# Patient Record
Sex: Male | Born: 1940 | Race: Black or African American | Hispanic: No | Marital: Single
Health system: Southern US, Community
[De-identification: ages and names within clinical notes are randomized; demographics above are authoritative.]

## PROBLEM LIST (undated history)

## (undated) DIAGNOSIS — I119 Hypertensive heart disease without heart failure: Secondary | ICD-10-CM

## (undated) DIAGNOSIS — E7800 Pure hypercholesterolemia, unspecified: Secondary | ICD-10-CM

## (undated) DIAGNOSIS — N529 Male erectile dysfunction, unspecified: Secondary | ICD-10-CM

## (undated) DIAGNOSIS — Z8679 Personal history of other diseases of the circulatory system: Secondary | ICD-10-CM

## (undated) DIAGNOSIS — E118 Type 2 diabetes mellitus with unspecified complications: Secondary | ICD-10-CM

## (undated) DIAGNOSIS — R519 Headache, unspecified: Secondary | ICD-10-CM

## (undated) DIAGNOSIS — Z8509 Personal history of malignant neoplasm of other digestive organs: Secondary | ICD-10-CM

## (undated) DIAGNOSIS — S065XAA Traumatic subdural hemorrhage with loss of consciousness status unknown, initial encounter: Secondary | ICD-10-CM

## (undated) DIAGNOSIS — M1990 Unspecified osteoarthritis, unspecified site: Secondary | ICD-10-CM

## (undated) DIAGNOSIS — I2510 Atherosclerotic heart disease of native coronary artery without angina pectoris: Secondary | ICD-10-CM

## (undated) DIAGNOSIS — R51 Headache: Secondary | ICD-10-CM

## (undated) DIAGNOSIS — F32A Depression, unspecified: Secondary | ICD-10-CM

## (undated) DIAGNOSIS — G4730 Sleep apnea, unspecified: Secondary | ICD-10-CM

## (undated) DIAGNOSIS — R413 Other amnesia: Secondary | ICD-10-CM

## (undated) DIAGNOSIS — S065X9A Traumatic subdural hemorrhage with loss of consciousness of unspecified duration, initial encounter: Secondary | ICD-10-CM

## (undated) DIAGNOSIS — F329 Major depressive disorder, single episode, unspecified: Secondary | ICD-10-CM

## (undated) DIAGNOSIS — K219 Gastro-esophageal reflux disease without esophagitis: Secondary | ICD-10-CM

## (undated) DIAGNOSIS — I639 Cerebral infarction, unspecified: Secondary | ICD-10-CM

## (undated) HISTORY — PX: EYE SURGERY: SHX253

## (undated) HISTORY — DX: Personal history of malignant neoplasm of other digestive organs: Z85.09

## (undated) HISTORY — PX: TONSILLECTOMY: SUR1361

## (undated) HISTORY — DX: Pure hypercholesterolemia, unspecified: E78.00

## (undated) HISTORY — DX: Personal history of other diseases of the circulatory system: Z86.79

## (undated) HISTORY — DX: Other amnesia: R41.3

## (undated) HISTORY — DX: Type 2 diabetes mellitus with unspecified complications: E11.8

## (undated) HISTORY — PX: CARPAL TUNNEL RELEASE: SHX101

---

## 1995-02-17 HISTORY — PX: CARDIAC CATHETERIZATION: SHX172

## 2000-04-08 ENCOUNTER — Ambulatory Visit (HOSPITAL_COMMUNITY): Admission: RE | Admit: 2000-04-08 | Discharge: 2000-04-08 | Payer: Self-pay | Admitting: Cardiology

## 2000-04-08 HISTORY — PX: CARDIAC CATHETERIZATION: SHX172

## 2000-04-16 ENCOUNTER — Encounter: Payer: Self-pay | Admitting: Thoracic Surgery (Cardiothoracic Vascular Surgery)

## 2000-04-20 ENCOUNTER — Inpatient Hospital Stay (HOSPITAL_COMMUNITY)
Admission: RE | Admit: 2000-04-20 | Discharge: 2000-04-27 | Payer: Self-pay | Admitting: Thoracic Surgery (Cardiothoracic Vascular Surgery)

## 2000-04-20 ENCOUNTER — Encounter: Payer: Self-pay | Admitting: Thoracic Surgery (Cardiothoracic Vascular Surgery)

## 2000-04-21 ENCOUNTER — Encounter: Payer: Self-pay | Admitting: Thoracic Surgery (Cardiothoracic Vascular Surgery)

## 2000-04-21 HISTORY — PX: CORONARY ARTERY BYPASS GRAFT: SHX141

## 2000-04-22 ENCOUNTER — Encounter: Payer: Self-pay | Admitting: Thoracic Surgery (Cardiothoracic Vascular Surgery)

## 2002-05-02 ENCOUNTER — Ambulatory Visit (HOSPITAL_COMMUNITY): Admission: RE | Admit: 2002-05-02 | Discharge: 2002-05-03 | Payer: Self-pay | Admitting: Cardiology

## 2002-05-02 HISTORY — PX: CARDIAC CATHETERIZATION: SHX172

## 2002-05-02 HISTORY — PX: CORONARY ANGIOPLASTY: SHX604

## 2004-01-29 ENCOUNTER — Encounter (INDEPENDENT_AMBULATORY_CARE_PROVIDER_SITE_OTHER): Payer: Self-pay | Admitting: *Deleted

## 2004-01-29 ENCOUNTER — Ambulatory Visit (HOSPITAL_COMMUNITY): Admission: RE | Admit: 2004-01-29 | Discharge: 2004-01-29 | Payer: Self-pay | Admitting: *Deleted

## 2004-10-30 ENCOUNTER — Encounter: Admission: RE | Admit: 2004-10-30 | Discharge: 2004-10-30 | Payer: Self-pay | Admitting: Neurology

## 2006-03-10 ENCOUNTER — Inpatient Hospital Stay (HOSPITAL_COMMUNITY): Admission: EM | Admit: 2006-03-10 | Discharge: 2006-03-12 | Payer: Self-pay | Admitting: Emergency Medicine

## 2007-11-07 ENCOUNTER — Emergency Department (HOSPITAL_COMMUNITY): Admission: EM | Admit: 2007-11-07 | Discharge: 2007-11-08 | Payer: Self-pay | Admitting: Emergency Medicine

## 2008-07-25 ENCOUNTER — Emergency Department (HOSPITAL_COMMUNITY): Admission: EM | Admit: 2008-07-25 | Discharge: 2008-07-26 | Payer: Self-pay | Admitting: Emergency Medicine

## 2009-06-07 ENCOUNTER — Encounter: Admission: RE | Admit: 2009-06-07 | Discharge: 2009-06-07 | Payer: Self-pay | Admitting: Internal Medicine

## 2010-07-04 NOTE — H&P (Signed)
NAME:  [name], CALLEROS NO.:  [id number]   MEDICAL RECORD NO.:  [id number]          PATIENT TYPE:  INP   LOCATION:  5031                         FACILITY:  [facility]   PHYSICIAN:  Ardeth Sportsman, MD     DATE OF BIRTH:  10/16/1940   DATE OF ADMISSION:  03/10/2006  DATE OF DISCHARGE:                              HISTORY & PHYSICAL   CARDIOLOGIST:  Dr. Aleen Campi with [facility].   ENDOCRINOLOGIST:  Dr. Juleen China.   REQUESTING PHYSICIAN:  Elliot L. Effie Shy, [name] with [facility].   TYPE OF CONSULTATION:  General surgical consultation.   REASON FOR CONSULTATION:  Abdominal pain, probable partial small bowel  obstruction.   HISTORY OF PRESENT ILLNESS:  [name] is a 70 year old gentleman  with coronary disease, status post bypass in 2002, otherwise stable,  diabetes on Lantus and 70/30 insulin, hypertension,  hypercholesterolemia, question of obstructive sleep apnea.  These issues  have been relatively stable.  He has not had any abdominal surgeries in  the past.  He has had some reflux disease and he has also had a  colonoscopy that showed some diverticulosis back in 2005.   He noted about a less than 24-hour history of severe abdominal pain,  diffuse, but primary left-sided.  The pain worsened to the point that he  actually started forcing himself to vomiting last night with some  bilious emesis.  No hematemesis.  No hematochezia or melena.  He could  not sleep at all last night and he came to the emergency room earlier  today and he has been in the ER for about 8 hours.  Initially his pain  was better controlled with some medication.  He got a CT scan of the  abdomen and actually vomited up some of the contrast, although now he  has had a loose bowel movement.  He denies any sick contacts or travel  history.  He has never had anything like this before.  He denies really  any change in his food and no one else has been sick around him.   PAST MEDICAL  HISTORY:  1. As noted above with coronary artery disease.  2. Diabetes, insulin requiring.  3. Hypercholesterolemia.  4. Hypertension.  5. Obstructive sleep apnea.  6. Gastroesophageal reflux disease.  7. Sigmoid diverticulosis.  8. History of gastritis in the past.  9. History of bilateral cataracts.   PAST SURGICAL HISTORY:  He had bilateral carpal tunnel releases.  He  also had coronary artery bypass grafting x5 here at [facility]  on April 20, 2000.   SOCIAL HISTORY:  He denies any tobacco, alcohol, or other drug use.   FAMILY HISTORY:  Some history of hypertension and diabetes in his  family, but no history of any major GI problems or other abnormalities.   MEDICATIONS:  He takes metformin, aspirin, Crestor, Seroquel,  metoprolol, Lantus, Novolin 70/30 insulin, Nexium,  amlodipine/benazepril, glimepiride, Plavix.   ALLERGIES:  NONE.   REVIEW OF SYSTEMS:  As noted per HPI.  In general, no recent fevers,  chills, or sweats.  No recent weight  gain or weight loss.  PSYCH:  No  history of depression or anxiety.  EYES:  History of cataracts in the  past, but otherwise his vision has been stable.  Otherwise negative.  ENT is otherwise negative.  No recent cold, cough, or flu.  PULMONARY:  No recent cold, cough, flu, or pleuritic chest pain.  CHEST:  He has not  had any exertional dyspnea on exertion or angina.  He had a cardiac  catheterization in 2004, which shows some stenting done of the mid right  coronary artery and first diagonal branch as well.  GI:  As above,  otherwise completely negative.  GU:  Negative.  LYMPH:  Negative.  ALLERGIC:  Negative.  SKIN:  Negative.  BREAST:  Negative.  HEPATIC/RENAL:  Negative.  ENDOCRINE:  He has been insulin-requiring,  but no major changes that he can recall.   PHYSICAL EXAMINATION:  VITAL SIGNS:  His came in with a temperature of  97.5, is the highest temperature he has had.  Blood pressure initially  is 167/85 currently  116/155, pulse of 75, respirations 18.  Initially  9/10 pain, currently 2/10 pain.  He is 96% saturation on room air.  GENERAL:  He is a well-developed, well-nourished, morbidly obese male.  He was lying on his abdomen rather still and tired, but not toxic.  PSYCH:  No evidence of dementia.  No evidence of psychosis or paranoia.  NEUROLOGICAL:  Cranial nerves II-XII are intact.  Hand grip is 5/5 equal  and symmetrical.  No resting or intention tremors.  EYES:  Pupils equal, round, and reactive to light.  Extraocular  movements are intact.  Sclerae nonicteric.  There is no evidence of any  jaundice.  Throat not injected.  ENT:  Normocephalic with no facial asymmetry.  Mucous membranes are dry.  Nasopharynx and oropharynx are clear.  NECK:  Supple without masses.  Trachea is midline.  HEART:  Regular rate and rhythm.  No murmurs, clicks, or rubs.  He has  well-healed sternotomy incision.  LUNGS:  Clear to auscultation bilaterally.  There are no wheezes, rales,  or rhonchi.  ABDOMEN:  Mildly distended, but soft.  He has some mild tenderness on  his left greater than right side, but no definite peritoneal signs.  GENITAL:  Normal external male genitalia.  Testes descended bilaterally  and no inguinal hernias.  RECTAL:  Deferred per patient request.  EXTREMITIES:  No clubbing, cyanosis, or edema.  MUSCULOSKELETAL:  Full range of motion shoulders, elbows, wrists, as  well hips, knees and ankles.  LYMPH:  No head, neck,axillary, or groin lymphadenopathy.  SKIN:  No petechiae.  No purpura.  No sores or lesions.   STUDIES:  He does have a white count of 21,000 with somewhat of a left  shift.  A hemoglobin of 17.2.  His creatinine is 1.2, potassium at 3.9,  lipase at 20.  Urinalysis is essentially negative, although he does have  elevated glucose.  His serum glucose is 369, which is elevated.  CT scan of the abdomen shows some mild to moderately dilated small bowel  loops with perhaps a  transition sign down in his nearest terminal ileum.  Colon does have gas and stool within it, but is not completely  decompressed.  It is concerning for a partial small bowel obstruction.  There is no evidence of any internal hernia.  There is no umbilical  hernia.  There is no inguinal hernia.  There is no free fluid.  He has  maybe some steatohepatosis of his liver, but otherwise his solid organs  are unremarkable and his gallbladder appears unremarkable as well.   ASSESSMENT AND PLAN:  A 70 year old gentleman with abdominal pain,  nausea, vomiting, and some diarrhea consistent with partial small bowel  obstruction, versus possible gastroenteritis.  1. Admit.  2. Hydrate.  3. Serial exams.  Will hold off narcotics.  4. If he vomits again, he gets an NG tube.  He just had a loose bowel      movement in the ER and says he is less distended now.  Will just      observe at this point.  5. He may require surgery if he does not improve.  6. Aggressive diabetic control with sliding scale insulin.  7. Hypertension control.  8. Hold on his anticoagulation of aspirin and Plavix at this time      until determined if he needs surgery or not.  9. Reflux disease is stable.  I am doubtful that that is the source of      his etiology of pain, but will keep him on his PPI.  10.Increase activity for deep venous thrombosis prophylaxis.  Possible      SCDs if not walking.      Ardeth Sportsman, MD  Electronically Signed     SCG/MEDQ  D:  03/10/2006  T:  03/11/2006  Job:  [id number]

## 2010-07-04 NOTE — Op Note (Signed)
NAME:  [name], [name] NO.:  [id number]   MEDICAL RECORD NO.:  [id number]          PATIENT TYPE:  AMB   LOCATION:  ENDO                         FACILITY:  [facility]   PHYSICIAN:  Georgiana Spinner, [name]    DATE OF BIRTH:  December 16, 1940   DATE OF PROCEDURE:  DATE OF DISCHARGE:                                 OPERATIVE REPORT   PROCEDURE:  Upper endoscopy with biopsy.   INDICATIONS:  Gastroesophageal reflux disease.   ANESTHESIA:  Demerol 40, Versed 4 mg.   DESCRIPTION OF PROCEDURE:  With the patient in mildly sedated in the left  lateral decubitus position, the Olympus video scopic endoscope was inserted  in the mouth, passed under direct vision through the esophagus which  appeared normal.  There was one area that could have been suspicious for  Barrett's.  I elected to biopsy it.  This was at the squamocolumnar  junction.  We then entered into the stomach.  The fundus appeared  erythematous and snake-skinned.  This was photographed and biopsied.  The  body, antrum, duodenal bulb, second portion of the duodenum all appeared  normal.  From this point, the endoscope was slowly withdrawn, taking  circumferential views of the duodenal mucosa until the endoscope had been  pulled back into the stomach, placed in retroflexion to view the stomach  from below.  The endoscope was straightened and withdrawn, taking  circumferential views of the remaining gastric and esophageal mucosa.  The  patient's vital signs and pulse oximeter remained stable.  The patient  tolerated the procedure well with no apparent complications.   FINDINGS:  Changes of the fundus of snake skinning and erythema, biopsied.  Question of Barrett's, biopsied.  Await biopsy report.  The patient will  call me for results.  Follow up as an outpatient.  Proceed to colonoscopy.       GMO/MEDQ  D:  01/29/2004  T:  01/29/2004  Job:  [id number]

## 2010-07-04 NOTE — Op Note (Signed)
[place]  Patient:    [name], [name]                      MRN: [id number] Proc. Date: 04/20/00 Adm. Date:  [id number] Attending:  Tressie Stalker CC:         Aram Candela. Aleen Campi, [name]  Bernadene Person, [name]   Operative Report  PREOPERATIVE DIAGNOSIS:  Severe three-vessel coronary artery disease with class III progressive angina.  POSTOPERATIVE DIAGNOSIS:  Severe three-vessel coronary artery disease with class III progressive angina.  PROCEDURE:  Median sternotomy for coronary artery bypass grafting x 5 (left internal mammary artery to distal left anterior descending coronary artery, saphenous vein graft to first diagonal branch and sequential saphenous vein graft to second circumflex marginal branch, saphenous vein graft to second diagonal branch, saphenous vein graft to posterior descending coronary artery).  SURGEON:  Salvatore Decent. Cornelius Moras, [name]  ASSISTANT:  Dominica Severin, P.A.  ANESTHESIA:  General.  BRIEF CLINICAL NOTE:  The patient is a a 70 year old obese African-American male followed by Dr. Adela Lank and referred by Dr. Charolette Child for management of coronary artery disease.  The patient is obese, has has of obstructive sleep apnea, hypertension, type 2 diabetes mellitus, and hypercholesterolemia.  He presents with a two to three month history of progressive angina.  Cardiac catheterization performed by Dr. Aleen Campi demonstrates severe three-vessel coronary artery disease with preserved left ventricular function.  OPERATIVE CONSENT:  The patient and his wife are counseled at length regarding the indications and potential benefits of coronary artery bypass grafting. They understand the associated risks of surgery, including but not limited to risk of death, stroke, myocardial infarction, bleeding requiring blood transfusion, arrhythmia, infection, and recurrent coronary artery disease. They accept these risks as well as any  unforeseen complications and desire to proceed with surgery as described.  DESCRIPTION OF PROCEDURE:  The patient is brought to the operating room on the above-mentioned date, and invasive hemodynamic monitoring is established by the anesthesia service under the care and direction of Judie Petit, [name] The patient is placed in the supine position on the operating table. Intravenous antibiotics are administered.  Following induction with general endotracheal anesthesia, the patients chest, abdomen, both groins, and both lower extremities are prepared and draped in a sterile manner.  A median sternotomy incision is performed, and the left internal mammary artery is dissected from the chest wall and prepared for bypass grafting.  The left internal mammary artery is felt to be good-quality conduit for bypass grafting.  Simultaneously, saphenous vein is obtained from the patients right thigh through a series of longitudinal incisions.  As the saphenous vein continues distally at the level of the knee, it becomes somewhat small and frail.  Therefore, an additional segment of saphenous vein is obtained from the patients left thigh rather than continuing below the patients knee on the right side.  The patient is heparinized systemically.  The pericardium is opened.  The ascending aorta is inspected and is notably free of any palpable plaques or calcifications.  The ascending aorta and the right atrium are cannulated for cardiopulmonary bypass.  Adequate heparinization is verified.  Cardiopulmonary bypass is begun, and the surface of the heart is inspected. There is mild to moderate left ventricular hypertrophy.  Distal sites are selected for coronary bypass grafting.  Portions of the saphenous vein and the left internal mammary artery are trimmed to appropriate length.  A temperature probe is placed in the left  ventricular septum, and a Styrofoam pad is placed to protect the left phrenic  nerve from thermal injury.  A cardioplegia catheter is placed in the ascending aorta.  The patient is cooled to 30 degrees systemic temperature.  The aortic crossclamp is applied, and cardioplegia is delivered in antegrade fashion to through the aortic root.  Ice saline slush is applied for topical hypothermia. The initial cardioplegic arrest and myocardial cooling are excellent.  Repeat doses of cardioplegia are administered intermittently throughout the crossclamp portion of the operation both through the aortic and down subsequently-placed vein grafts to maintain septal temperature below 15 degrees Centigrade.  The following distal coronary anastomoses are performed: (1) Posterior descending coronary artery is grafted in an end-to-side fashion using running 7-0 Prolene suture.  This coronary measures 1.5 mm in diameter and is of good quality.  (2) The first diagonal branch off the left anterior descending coronary artery is grafted with a saphenous vein graft in a side-to-side fashion using a side branch of the vein conduit.  This coronary measures 1.6 mm in diameter and is of fair quality.  (3) The second circumflex marginal branch is grafted using the sequential saphenous vein graft off the vein placed to the first diagonal branch.  This coronary measures 1.5 mm in diameter and is of fair to good quality.  (4) The second diagonal branch off the left anterior descending coronary artery was grafted with a saphenous vein graft in an end-to-side fashion.  This coronary measures 1.5 mm in diameter and is of fair to good quality.  (5) The distal left anterior descending coronary artery was grafted with the left internal mammary artery using running 8-0 Prolene suture.  This coronary measures 2.0 mm in diameter and is of good quality.  It is intramyocardial.  The septal temperature is noted to rise rapidly and dramatically upon reperfusion of the left internal mammary artery.  The aortic  crossclamp is removed after a total crossclamp time of 68  minutes.  The heart begins to beat spontaneously.  All three proximal saphenous vein anastomoses are performed directly to the ascending aorta under a separate partial occlusion clamp.  All proximal and distal anastomoses are inspected for hemostasis and appropriate graft orientation.  Epicardial pacing wires are affixed to the right ventricular outflow tract and to the right atrial appendage. The patient is rewarmed to greater than 37 degrees.  The patient is weaned from cardiopulmonary bypass without difficulty.  The patients rhythm at separation from bypass is a junctional bradycardia requiring AV sequential dual-chamber pacing.  Total cardiopulmonary bypass time for the operation is 121 minutes.  The venous and arterial cannulae are removed uneventfully.  Protamine is administered to reverse the anticoagulation.  The mediastinum and the left chest are irrigated with saline solution containing vancomycin.  Meticulous surgical hemostasis is ascertained.  The mediastinum and the left chest are drained with three chest tubes placed through separate stab incisions inferiorly.  The median sternotomy is closed in a routine fashion.  Both thigh incisions are closed in multiple layers after placement of 19 French Blake drains to drain the deep subcutaneous tissues.  Skin staples are used to close the skin incisions of both thighs, while a subcuticular skin closure is used for the sternal incision.  The patient tolerated the procedure well.  The patient is transported to the surgical intensive care unit in stable condition.  There are no intraoperative complications.  All sponge, instrument, and needle counts are verified correct at completion of the operation.  No autologous blood products were administered. DD:  04/20/00 TD:  04/21/00 Job: [id number]

## 2010-07-04 NOTE — Discharge Summary (Signed)
[name], [name]                      MRN: [id number] Adm. Date:  [id number] Disc. Date: [id number] Attending:  Tressie Stalker Dictator:   Carlye Grippe. CC:         Salvatore Decent. Cornelius Moras, [name]  Leomar R. Aleen Campi, [name]  Bernadene Person, [name]   Discharge Summary  HISTORY OF PRESENT ILLNESS:  This is a 70 year old, obese, African-American male with a history of coronary artery disease, hypertension, type 2 diabetes mellitus, hypercholesterolemia, and obstructive sleep apnea.  He underwent angioplasty in 1997 for the first and second diagonal branches of the left anterior descending artery, as well as a second circumflex marginal branch. He has done well until recently when over the past approximate two to three months he has developed symptoms of progressive exertional angina.  He describes episodes of substernal chest tightness which are brought on by stress or physician activity and typically relieved with rest.  He underwent a recent stress test which was positive, prompting elective cardiac catheterization by Aram Candela. Aleen Campi, [name], on April 08, 2000.  This demonstrated severe three-vessel coronary artery disease with preserved left ventricular function. He was referred to [facility]. Cornelius Moras, [name], for evaluation for coronary surgical revascularization.  Dr. Cornelius Moras evaluated the patient and his studies and agreed with proceeding with the procedure.  PAST MEDICAL HISTORY:  Coronary artery disease as stated previously.  He also has a history of hypertension, type 2 diabetes mellitus, currently insulin dependent, hypercholesterolemia, obstructive sleep apnea, remote history of gastritis, and bilateral cataracts.  PAST SURGICAL HISTORY:  Bilateral carpal tunnel syndrome, otherwise unremarkable.  FAMILY HISTORY:  Please see the history and physical done at the time of admission.  SOCIAL HISTORY:  Please see the history  and physical done at the time of admission.  REVIEW OF SYSTEMS:  Please see the history and physical done at the time of admission.  PHYSICAL EXAMINATION:  Please see the history and physical done at the time of admission.  MEDICATIONS ON ADMISSION:  1. Insulin 70/30 60 units twice daily.  2. Lantus insulin 20 units once daily.  3. Toprol XL 50 mg once daily.  4. Glucophage 1000 mg twice daily.  5. Guaifenesin 2 mg once daily.  6. Chlorthalidone 25 mg daily.  7. Amaryl 4 mg twice daily.  8. Seroquel 600 mg once daily at bedtime.  The patient states that he takes     200 mg of Seroquel daily.  9. Nexium 40 mg once daily. 10. Gabitril 4 mg daily. 11. Enteric-coated aspirin 325 mg daily. 12. Plavix 75 mg daily.  ALLERGIES:  No known drug allergies.  HOSPITAL COURSE:  The patient was admitted electively on April 20, 2000, and underwent the following procedure:  Coronary artery bypass grafting x 5.  The following grafts were placed:  1. Left internal mammary artery to the LAD. 2. A saphenous vein graft sequentially to the diagonal #1 and the circumflex marginal #2.  3. A saphenous vein graft to the diagonal #2.  4. A saphenous vein graft to the posterior descending coronary artery.  The cross clamp time was 68 minutes and pump time 121 minutes.  The patient required no blood products and came off cardiopulmonary bypass with no inotropic agents.  He did come off cardiopulmonary bypass with AV sequential pacing.  He was taken to the surgical intensive care unit  in stable condition.  During the postoperative hospital course, the patient has done well. Postoperatively, he has maintained stable hemodynamics.  All routine lines, monitors, drips, and tubes were discontinued in a step-wise routine fashion. The patient did have a postoperative anemia and has been started on iron for a hematocrit of 24%.  The patient did have some mild difficulties with bronchial constriction and wheezing  and was placed on a Combivent metered dose inhaler for a short period of time and this appears to have resolved.  The patient has had some difficulty stabilizing his capillary blood glucose levels and adjustments have been made throughout his hospitalization for his diabetic management.  Currently the patient is quite stable.  He is afebrile.  He is in a normal sinus rhythm.  Capillary blood glucoses and laboratory values are felt to be under stable and adequate control on the current regimen.  His incisions are healing well without signs of infection.  He tolerate diet and activities commensurate for level of postoperative convalescence.  Overall he is tentatively felt to be quite stable for tentative discharge in the morning of April 27, 2000, following morning round re-evaluation.  DISCHARGE MEDICATIONS:  Medications on discharge include coated aspirin one q.d., Tylox one to two q.4-6h. p.r.n., Lopressor 25 mg every 12 hours.  He is to resume his Nexium 40 mg daily, Gabitril 4 mg daily, Glucophage 1 g twice daily, and Lantus Insulin 20 units daily.  In consultation with W. Adela Lank, [name], the patient is recommended to a 70/30 Insulin 30 units twice daily with close follow-up of capillary blood glucoses, as well as to see him in a week.  Additionally, the patient will remain on Seroquel 200 mg at bedtime.  Additional medications will include iron sulfate 325 mg twice daily, Lasix 40 mg daily for seven days, and potassium chloride 20 mEq daily for seven days.  FINAL DIAGNOSES: 1. Coronary artery disease. 2. Hypertension. 3. Diabetes mellitus, type 2. 4. Hyperlipidemia. 5. Sleep apnea. 6. History of gastritis. 7. Postoperative anemia.  FOLLOW-UP:  Aram Candela. Tysinger, [name], in two weeks.  Bernadene Person, [name], in one week.  Salvatore Decent. Cornelius Moras, [name], in three weeks.  Staple removal will be  sequentially with half staples to be removed on April 30, 2000, and the rest on May 03, 2000, at the [facility] office.  DISCHARGE INSTRUCTIONS:  The patient will receive written instructions in regard to medications, activity, diet, wound care, and follow-up. DD:  04/26/00 TD:  04/27/00 Job: [id number]

## 2010-07-04 NOTE — Cardiovascular Report (Signed)
[place]. [facility]  Patient:    [name], [name]                      MRN: [id number] Proc. Date: 04/08/00 Adm. Date:  [id number] Disc. Date: [id number] Attending:  Silvestre Mesi CC:         Bernadene Person, [name]             [facility]                        Cardiac Catheterization  REFERRING PHYSICIAN:  Bernadene Person, [name]  PROCEDURES PERFORMED: 1. Left heart catheterization. 2. Coronary cineangiography. 3. Left internal mammary artery cineangiography. 4. Left ventricular cineangiography. 5. Abdominal aortogram. 6. Perclose to the right femoral artery.  INDICATIONS:  This 70 year old male has a history of coronary artery disease with PTCA of his first and second diagonal branches and his second obtuse marginal branch in 1997. He has done well until recently. He has had more frequent mid-chest discomfort and a repeat treadmill exercise test was early double-positive for ischemic heart disease.  DESCRIPTION OF PROCEDURE:  After signing an informed consent, the patient was premedicated with 50 mg of Benadryl intravenously and brought to the cardiac catheterization lab. His right groin was prepped and draped in a sterile fashion and anesthetized locally with 1% lidocaine. A #6 French introducer sheath was inserted percutaneously into the right femoral artery. #6 [place] #4 Judkins coronary catheters were used to make injections into the coronary arteries. The right coronary catheter was used to make a midstream injection into the left subclavian artery visualizing the left internal mammary artery. A #6 French pigtail catheter was used to measure pressures in the left ventricle and aorta and to make midstream injections into the left ventricle and abdominal aorta. The patient tolerated the procedure well and no complications were noted. At the end of the procedure, the catheter and sheath were removed from the right femoral artery and hemostasis  was easily obtained with a Perclose closure system.  MEDICATIONS GIVEN:  None.  HEMODYNAMIC DATA: 1. Left ventricular pressure 149/10-26 mmHg. 2. Aortic pressure 157/85 mmHg with a mean of 111 mmHg. 3. Left ventricular ejection fraction 50%.  CORONARY CINEANGIOGRAPHY: 1. Left main coronary artery:  The ostium in the left main coronary artery    appeared normal. 2. Left anterior descending artery:  The left anterior descending artery    is severely diseased in the proximal segment just distal to the first    septal branch with a severe 95% stenotic lesion which is between the    first and second anterolateral branches. The second anterolateral    branch has a severe 90% stenosis in its proximal segment. The site of    prior angioplasty in the first anterolateral branch now is normal in    appearance and the site in the second anterolateral branch has restenosis    similar to that prior to his angioplasty in 1997. The continuation in    the intraventricular groove has minor irregularities but has antegrade    flow and normal distal vessel. 3. Left circumflex coronary artery:  The circumflex has a normal proximal    segment. There is a large bifurcation in the middle segment with    the large second obtuse marginal branch with a critical stenosis of    95% and this is the prior angioplasty site from 1997. There is slow  antegrade flow through this lesion. 4. Right coronary artery:  The ostium appears normal. There are mild    irregularities in the proximal and middle segments. There is a focal    60-70% stenosis in the distal segment before the takeoff of the    posterolateral branch. The posterolateral branch has a marked    progression of disease since his prior study, now with a 95%    stenosis in its proximal segment. The segment at the crux appears    normal. 5. Left internal mammary artery:  The left internal mammary artery    appears normal.  LEFT VENTRICULAR  CINEANGIOGRAM: 1. Left ventricular chamber size is mildly enlarged. 2. Left ventricular wall thickness appears normal. 3. The overall left ventricular contractility is low-normal    with an ejection fraction estimated at approximately 50%    and with generalized mild hypokinesia. 4. The mitral and aortic valves appear normal.  ABDOMINAL AORTOGRAM:  The abdominal aorta and renal arteries appear normal.  FINAL DIAGNOSES: 1. Three-vessel coronary artery disease with critical stenosis in all    three coronary arteries. 2. Good left ventricular function with generalized mild hypokinesia,    ejection fraction 50%. 3. Normal left internal mammary artery. 4. Normal mitral and aortic valves. 5. Normal abdominal aorta and renal arteries. 6. Successful Perclose of the right femoral artery.  DISPOSITION:  Will ask CVTS to see for coronary artery bypass graft surgery. DD:  04/08/00 TD:  04/09/00 Job: [id number]

## 2010-07-04 NOTE — Op Note (Signed)
NAME:  NIEKO, CLARIN NO.:  [id number]   MEDICAL RECORD NO.:  [id number]          PATIENT TYPE:  AMB   LOCATION:  ENDO                         FACILITY:  [facility]   PHYSICIAN:  Georgiana Spinner, [name]    DATE OF BIRTH:  Jun 23, 1940   DATE OF PROCEDURE:  01/29/2004  DATE OF DISCHARGE:                                 OPERATIVE REPORT   PROCEDURE PERFORMED:  Colonoscopy.   ENDOSCOPIST:  Georgiana Spinner, [name]   INDICATIONS FOR PROCEDURE:  Colon cancer screening.   ANESTHESIA:  Demerol 10 mg, Versed 2 mg.   DESCRIPTION OF PROCEDURE:  With the patient mildly sedated in the left  lateral decubitus position, a rectal examination was performed which was  unremarkable.  Subsequently, the Olympus videoscopic colonoscope was  inserted in the rectum and passed under direct vision to the cecum,  identified by the ileocecal valve and appendiceal orifice, both of which  were photographed.  From this point the colonoscope was slowly withdrawn  taking circumferential views of the colonic mucosa stopping only in the  rectum which appeared normal on direct and retroflex view.  The endoscope  was straightened and withdrawn.  The patient's vital signs and pulse  oximeter remained stable.  The patient tolerated the procedure well without  apparent complications.   FINDINGS:  Diverticulosis of the sigmoid colon.  Otherwise unremarkable  examination.   PLAN:  See endoscopy note for further details.       GMO/MEDQ  D:  01/29/2004  T:  01/29/2004  Job:  [id number]

## 2010-07-04 NOTE — Cardiovascular Report (Signed)
NAME:  [name], [name]                         ACCOUNT NO.:  [id number]   MEDICAL RECORD NO.:  [id number]                   PATIENT TYPE:  OIB   LOCATION:  2861                                 FACILITY:  [facility]   PHYSICIAN:  Aram Candela. Tysinger, [name]              DATE OF BIRTH:  October 02, 1940   DATE OF PROCEDURE:  05/02/2002  DATE OF DISCHARGE:                              CARDIAC CATHETERIZATION   PROCEDURES:  1. Left coronary cineangiography.  2. Stent of the mid right coronary artery.  3. Stenting of the distal right coronary artery.  4. Stenting of the first diagonal branch.  5. Perclose of the right femoral artery.   INDICATION FOR PROCEDURES:  This 70 year old male has a history of three-  vessel coronary artery disease, status post coronary artery bypass graft  surgery approximately 18 months ago.  He was recently catheterized finding  total occlusion of his right coronary vein graft and his first diagonal vein  graft.  The remaining grafts were functioning normally to his obtuse  marginals and LAD.  With the recent onset of chest pain and positive stress  test, he was then scheduled for angioplasty of his native vessels rather  than attempt opening his vein grafts.   PROCEDURE:  After signing an informed consent the patient was premedicated  with 5 mg of Valium by mouth and brought to the cardiac catheterization lab.  His right groin is prepped and draped in sterile fashion and anesthetized  locally with 1% lidocaine.  A 6 French introducer sheath was inserted  percutaneously into the right femoral artery.  A 6 [place] guide catheter  was used to make injection into the right coronary artery.  We then selected  a short Hi-Torque Floppy guide wire which was advanced to the distal right  coronary artery.  We first selected a 3.5 x 12 mm Express stent deployment  system which was advanced over the guide wire and positioned within the mid  right coronary lesion.  This stent was  then deployed with one inflation at  16 atmospheres for 39 seconds.  We then selected a second stent for the  right coronary artery, a 3.0 by 8 mm Zeta stent deployment system which was  advanced over the guide wire in position within the distal right coronary  lesion.  This stent was then deployed with one inflation at 14 atmospheres  for 36 seconds.  We then slipped a 6 [place] guide catheter which was  used to make injections into the left coronary artery.  We then advanced the  short Hi-Torque Floppy guide wire into the LAD and first diagonal branch  before selecting a 2.75 x 24 mm Taxus Express stent system.  After proper  preparation this was advanced over the guide wire and positioned within the  first diagonal branch lesion.  Two inflations were made - the first at 18  atmospheres for 50  seconds and the second at 22 atmospheres for 39 seconds.  The patient tolerated the procedure well, and no complications were noted at  the end of the procedure.  The catheter and sheaths were removed from the  right femoral artery and hemostasis was easily obtained with the Perclose  closure system.   MEDICATIONS GIVEN:  Heparin 5000 units IV, Integrilin drip per pharmacy  protocol, metoprolol 5 mg IV times three.   STUDY FINDINGS:  CORONARY CINEANGIOGRAPHY   RIGHT CORONARY ARTERY:  The ostium appears normal.  The right coronary  artery is a large dominant vessel.  There is a focal 70% stenosis in the  middle segment and a focal 70% stenosis in the distal segment prior to the  posterior descending.  The posterior descending has a diffuse plaque but  without significant focal stenosis.   LEFT CORONARY ARTERY:  The LAD is diffusely diseased in the proximal segment  after the takeoff of the first diagonal branch.  The mid and distal LAD  filled by way of internal mammary graft.  The first large anterolateral  branch had a 90% stenosis in its proximal segment.  There are two focal 90%   lesions.  The remainder of the left coronary artery is as previously  described on his cath on 04/25/2002.   ANGIOPLASTY CINE:  Cine___________ during the angioplasty procedure showed  proper positioning of the stent deployment systems in mid and distal right  coronary lesions and also in the first diagonal branch.  Final injections  into the right coronary artery and left coronary artery showed excellent  angiographic results in all three sites and re-establishment of normal  antegrade flow.  There was 0% residual stenosis.  There was no evidence for  dissection or clot.   FINAL DIAGNOSES:  1. Seventy percent stenosis, mid right coronary artery.  2. Seventy percent stenosis, distal right coronary artery.  3. Ninety percent stenosis, first diagonal branch.  Large vessel.  4. Successful primary stent placement in the mid right coronary artery,     distal right coronary artery and first diagonal branch.  5. Successful Perclose of the right femoral artery.   DISPOSITION:  Will monitor on the EAD overnight and continue the Integrilin  drip for 18 hours.                                               Jahmani R. Tysinger, [name]    JRT/MEDQ  D:  05/02/2002  T:  05/03/2002  Job:  [id number]   cc:   Cath Lab, [facility]

## 2010-07-17 ENCOUNTER — Other Ambulatory Visit (HOSPITAL_COMMUNITY): Payer: Self-pay | Admitting: Neurology

## 2010-07-17 DIAGNOSIS — R413 Other amnesia: Secondary | ICD-10-CM

## 2010-07-18 HISTORY — PX: TRANSTHORACIC ECHOCARDIOGRAM: SHX275

## 2010-07-21 ENCOUNTER — Encounter (HOSPITAL_COMMUNITY)
Admission: RE | Admit: 2010-07-21 | Discharge: 2010-07-21 | Disposition: A | Discharge status: Home / Self Care | Payer: No Typology Code available for payment source | Source: Ambulatory Visit | Attending: Neurology | Admitting: Neurology

## 2010-07-21 DIAGNOSIS — R413 Other amnesia: Secondary | ICD-10-CM | POA: Insufficient documentation

## 2010-07-28 ENCOUNTER — Emergency Department (HOSPITAL_COMMUNITY): Payer: Medicare Other

## 2010-07-28 ENCOUNTER — Emergency Department (HOSPITAL_COMMUNITY)
Admission: EM | Admit: 2010-07-28 | Discharge: 2010-07-29 | Disposition: A | Discharge status: Home / Self Care | Payer: Medicare Other | Attending: Emergency Medicine | Admitting: Emergency Medicine

## 2010-07-28 DIAGNOSIS — I2510 Atherosclerotic heart disease of native coronary artery without angina pectoris: Secondary | ICD-10-CM | POA: Insufficient documentation

## 2010-07-28 DIAGNOSIS — H532 Diplopia: Secondary | ICD-10-CM | POA: Insufficient documentation

## 2010-07-28 DIAGNOSIS — G4730 Sleep apnea, unspecified: Secondary | ICD-10-CM | POA: Insufficient documentation

## 2010-07-28 DIAGNOSIS — K219 Gastro-esophageal reflux disease without esophagitis: Secondary | ICD-10-CM | POA: Insufficient documentation

## 2010-07-28 DIAGNOSIS — F3289 Other specified depressive episodes: Secondary | ICD-10-CM | POA: Insufficient documentation

## 2010-07-28 DIAGNOSIS — F329 Major depressive disorder, single episode, unspecified: Secondary | ICD-10-CM | POA: Insufficient documentation

## 2010-07-28 DIAGNOSIS — E7800 Pure hypercholesterolemia, unspecified: Secondary | ICD-10-CM | POA: Insufficient documentation

## 2010-07-28 DIAGNOSIS — I10 Essential (primary) hypertension: Secondary | ICD-10-CM | POA: Insufficient documentation

## 2010-07-28 DIAGNOSIS — K5730 Diverticulosis of large intestine without perforation or abscess without bleeding: Secondary | ICD-10-CM | POA: Insufficient documentation

## 2010-07-28 DIAGNOSIS — G589 Mononeuropathy, unspecified: Secondary | ICD-10-CM | POA: Insufficient documentation

## 2010-07-28 DIAGNOSIS — E119 Type 2 diabetes mellitus without complications: Secondary | ICD-10-CM | POA: Insufficient documentation

## 2010-07-28 DIAGNOSIS — R42 Dizziness and giddiness: Secondary | ICD-10-CM | POA: Insufficient documentation

## 2010-07-28 LAB — CBC
HCT: 44.8 % (ref 39.0–52.0)
Hemoglobin: 15.9 g/dL (ref 13.0–17.0)
MCHC: 35.5 g/dL (ref 30.0–36.0)
Platelets: 214 10*3/uL (ref 150–400)
RBC: 5.03 MIL/uL (ref 4.22–5.81)
WBC: 13.7 10*3/uL — ABNORMAL HIGH (ref 4.0–10.5)

## 2010-07-28 LAB — DIFFERENTIAL
Basophils Relative: 0 % (ref 0–1)
Lymphocytes Relative: 13 % (ref 12–46)
Lymphs Abs: 1.7 10*3/uL (ref 0.7–4.0)
Monocytes Absolute: 0.6 10*3/uL (ref 0.1–1.0)
Neutro Abs: 11.3 10*3/uL — ABNORMAL HIGH (ref 1.7–7.7)
Neutrophils Relative %: 83 % — ABNORMAL HIGH (ref 43–77)

## 2010-07-28 LAB — BASIC METABOLIC PANEL
BUN: 25 mg/dL — ABNORMAL HIGH (ref 6–23)
GFR calc non Af Amer: 50 mL/min — ABNORMAL LOW (ref >60)
Sodium: 141 mEq/L (ref 135–145)

## 2010-08-08 ENCOUNTER — Encounter (INDEPENDENT_AMBULATORY_CARE_PROVIDER_SITE_OTHER): Payer: Medicare Other

## 2010-08-08 DIAGNOSIS — R0989 Other specified symptoms and signs involving the circulatory and respiratory systems: Secondary | ICD-10-CM

## 2010-08-12 ENCOUNTER — Other Ambulatory Visit (HOSPITAL_COMMUNITY): Payer: Self-pay | Admitting: Neurology

## 2010-08-12 ENCOUNTER — Ambulatory Visit (HOSPITAL_COMMUNITY): Payer: Medicare Other | Attending: Neurology | Admitting: Radiology

## 2010-08-12 DIAGNOSIS — I079 Rheumatic tricuspid valve disease, unspecified: Secondary | ICD-10-CM | POA: Insufficient documentation

## 2010-08-12 DIAGNOSIS — R413 Other amnesia: Secondary | ICD-10-CM | POA: Insufficient documentation

## 2010-08-12 DIAGNOSIS — E785 Hyperlipidemia, unspecified: Secondary | ICD-10-CM | POA: Insufficient documentation

## 2010-08-12 DIAGNOSIS — I2510 Atherosclerotic heart disease of native coronary artery without angina pectoris: Secondary | ICD-10-CM | POA: Insufficient documentation

## 2010-08-12 DIAGNOSIS — E119 Type 2 diabetes mellitus without complications: Secondary | ICD-10-CM | POA: Insufficient documentation

## 2010-08-12 DIAGNOSIS — I10 Essential (primary) hypertension: Secondary | ICD-10-CM | POA: Insufficient documentation

## 2010-08-12 DIAGNOSIS — I080 Rheumatic disorders of both mitral and aortic valves: Secondary | ICD-10-CM | POA: Insufficient documentation

## 2010-08-12 DIAGNOSIS — I369 Nonrheumatic tricuspid valve disorder, unspecified: Secondary | ICD-10-CM

## 2010-08-19 ENCOUNTER — Other Ambulatory Visit: Payer: Self-pay | Admitting: Neurology

## 2010-08-19 DIAGNOSIS — G529 Cranial nerve disorder, unspecified: Secondary | ICD-10-CM

## 2010-08-22 ENCOUNTER — Ambulatory Visit
Admission: RE | Admit: 2010-08-22 | Discharge: 2010-08-22 | Disposition: A | Discharge status: Home / Self Care | Payer: Medicare Other | Source: Ambulatory Visit | Attending: Neurology | Admitting: Neurology

## 2010-08-22 DIAGNOSIS — G529 Cranial nerve disorder, unspecified: Secondary | ICD-10-CM

## 2010-08-22 MED ORDER — IOHEXOL 350 MG/ML SOLN
100.0000 mL | Freq: Once | INTRAVENOUS | Status: AC | PRN
  Administered 2010-08-22: 100 mL via INTRAVENOUS

## 2010-11-17 LAB — CBC
Hemoglobin: 14.2
MCV: 94.4
Platelets: 204
RDW: 15
WBC: 11.5 — ABNORMAL HIGH

## 2010-11-17 LAB — URINALYSIS, ROUTINE W REFLEX MICROSCOPIC
Bilirubin Urine: NEGATIVE
Glucose, UA: >1000 — AB
Hgb urine dipstick: NEGATIVE
Leukocytes, UA: NEGATIVE
Nitrite: NEGATIVE
Protein, ur: 100 — AB
pH: 6

## 2010-11-17 LAB — DIFFERENTIAL
Eosinophils Absolute: 0.1
Eosinophils Relative: 1
Lymphocytes Relative: 20
Lymphs Abs: 2.3
Monocytes Relative: 5

## 2010-11-17 LAB — RAPID URINE DRUG SCREEN, HOSP PERFORMED
Barbiturates: NOT DETECTED
Cocaine: NOT DETECTED
Tetrahydrocannabinol: NOT DETECTED

## 2010-11-17 LAB — GLUCOSE, CAPILLARY
Glucose-Capillary: 108 — ABNORMAL HIGH
Glucose-Capillary: 86

## 2010-11-17 LAB — BASIC METABOLIC PANEL
BUN: 18
CO2: 29
Calcium: 8.6
Chloride: 104
GFR calc Af Amer: 56 — ABNORMAL LOW
Glucose, Bld: 339 — ABNORMAL HIGH
Sodium: 140

## 2010-11-17 LAB — ETHANOL: Alcohol, Ethyl (B): <5

## 2010-12-08 ENCOUNTER — Ambulatory Visit: Payer: Medicare Other | Attending: Neurology | Admitting: Physical Therapy

## 2010-12-08 DIAGNOSIS — M6281 Muscle weakness (generalized): Secondary | ICD-10-CM | POA: Insufficient documentation

## 2010-12-08 DIAGNOSIS — IMO0001 Reserved for inherently not codable concepts without codable children: Secondary | ICD-10-CM | POA: Insufficient documentation

## 2010-12-08 DIAGNOSIS — R269 Unspecified abnormalities of gait and mobility: Secondary | ICD-10-CM | POA: Insufficient documentation

## 2010-12-16 ENCOUNTER — Ambulatory Visit: Payer: Medicare Other | Admitting: Physical Therapy

## 2010-12-19 ENCOUNTER — Ambulatory Visit: Payer: Medicare Other | Attending: Neurology | Admitting: Physical Therapy

## 2010-12-19 DIAGNOSIS — R269 Unspecified abnormalities of gait and mobility: Secondary | ICD-10-CM | POA: Insufficient documentation

## 2010-12-19 DIAGNOSIS — IMO0001 Reserved for inherently not codable concepts without codable children: Secondary | ICD-10-CM | POA: Insufficient documentation

## 2010-12-19 DIAGNOSIS — M6281 Muscle weakness (generalized): Secondary | ICD-10-CM | POA: Insufficient documentation

## 2010-12-24 ENCOUNTER — Ambulatory Visit: Payer: Medicare Other | Admitting: Physical Therapy

## 2010-12-26 ENCOUNTER — Encounter: Payer: Medicare Other | Admitting: Physical Therapy

## 2010-12-31 ENCOUNTER — Ambulatory Visit: Payer: Medicare Other | Admitting: Physical Therapy

## 2011-01-02 ENCOUNTER — Encounter: Payer: Medicare Other | Admitting: Physical Therapy

## 2011-01-05 ENCOUNTER — Ambulatory Visit: Payer: Medicare Other | Admitting: Physical Therapy

## 2011-01-07 ENCOUNTER — Encounter: Payer: Medicare Other | Admitting: Physical Therapy

## 2011-01-13 ENCOUNTER — Encounter: Payer: Medicare Other | Admitting: Physical Therapy

## 2011-01-16 ENCOUNTER — Encounter: Payer: Medicare Other | Admitting: Physical Therapy

## 2012-02-16 ENCOUNTER — Ambulatory Visit: Payer: Medicare Other | Admitting: Family Medicine

## 2012-02-25 ENCOUNTER — Other Ambulatory Visit: Payer: Self-pay | Admitting: Urology

## 2012-03-03 ENCOUNTER — Encounter (HOSPITAL_COMMUNITY): Payer: Self-pay | Admitting: Pharmacy Technician

## 2012-03-03 ENCOUNTER — Ambulatory Visit (HOSPITAL_COMMUNITY)
Admission: RE | Admit: 2012-03-03 | Discharge: 2012-03-03 | Disposition: A | Discharge status: Home / Self Care | Payer: Medicare Other | Source: Ambulatory Visit | Attending: Urology | Admitting: Urology

## 2012-03-03 ENCOUNTER — Encounter (HOSPITAL_COMMUNITY)
Admission: RE | Admit: 2012-03-03 | Discharge: 2012-03-03 | Disposition: A | Discharge status: Home / Self Care | Payer: Medicare Other | Source: Ambulatory Visit | Attending: Urology | Admitting: Urology

## 2012-03-03 ENCOUNTER — Encounter (HOSPITAL_COMMUNITY): Payer: Self-pay

## 2012-03-03 DIAGNOSIS — Z01812 Encounter for preprocedural laboratory examination: Secondary | ICD-10-CM | POA: Insufficient documentation

## 2012-03-03 DIAGNOSIS — N529 Male erectile dysfunction, unspecified: Secondary | ICD-10-CM | POA: Insufficient documentation

## 2012-03-03 HISTORY — DX: Major depressive disorder, single episode, unspecified: F32.9

## 2012-03-03 HISTORY — DX: Unspecified osteoarthritis, unspecified site: M19.90

## 2012-03-03 HISTORY — DX: Depression, unspecified: F32.A

## 2012-03-03 HISTORY — DX: Cerebral infarction, unspecified: I63.9

## 2012-03-03 HISTORY — DX: Gastro-esophageal reflux disease without esophagitis: K21.9

## 2012-03-03 HISTORY — DX: Male erectile dysfunction, unspecified: N52.9

## 2012-03-03 LAB — CBC
Hemoglobin: 12.2 g/dL — ABNORMAL LOW (ref 13.0–17.0)
MCHC: 33.2 g/dL (ref 30.0–36.0)
RDW: 14.4 % (ref 11.5–15.5)
WBC: 10.1 10*3/uL (ref 4.0–10.5)

## 2012-03-03 LAB — BASIC METABOLIC PANEL
Calcium: 9.5 mg/dL (ref 8.4–10.5)
Chloride: 108 mEq/L (ref 96–112)
Creatinine, Ser: 1.27 mg/dL (ref 0.50–1.35)
GFR calc Af Amer: 64 mL/min — ABNORMAL LOW (ref >90)
Sodium: 146 mEq/L — ABNORMAL HIGH (ref 135–145)

## 2012-03-03 LAB — SURGICAL PCR SCREEN
MRSA, PCR: NEGATIVE
Staphylococcus aureus: NEGATIVE

## 2012-03-03 NOTE — Patient Instructions (Addendum)
21 [name]  03/03/2012   Your procedure is scheduled on: 03/15/12  Report to [facility] at 0800 AM.  Call this number if you have problems the morning of surgery [phone]: [phone]   Remember: take half of insulin night before   Do not eat food or drink liquids After Midnight.      Do not wear jewelry, make-up or nail polish.  Do not wear lotions, powders, or perfumes. You may wear deodorant.  Do not shave 48 hours prior to surgery. Men may shave face and neck.  Do not bring valuables to the hospital.  Contacts, dentures or bridgework may not be worn into surgery.  Leave suitcase in the car. After surgery it may be brought to your room.  For patients admitted to the hospital, checkout time is 11:00 AM the day of discharge.    Please read over the following fact sheets that you were given: MRSA Information, incentive spirometer fact sheet Birdie Sons, RN  pre op nurse call if needed [phone]    FAILURE TO FOLLOW THESE INSTRUCTIONS MAY RESULT IN CANCELLATION OF YOUR SURGERY   Patient Signature: ___________________________________________

## 2012-03-03 NOTE — Progress Notes (Signed)
03/03/12 0915  OBSTRUCTIVE SLEEP APNEA  Have you ever been diagnosed with sleep apnea through a sleep study? No  Do you snore loudly (loud enough to be heard through closed doors)?  0  Do you often feel tired, fatigued, or sleepy during the daytime? 1  Has anyone observed you stop breathing during your sleep? 0  Do you have, or are you being treated for high blood pressure? 1  BMI more than 35 kg/m2? 0  Age over 71 years old? 1  Neck circumference greater than 40 cm/18 inches? 0  Gender: 1  Obstructive Sleep Apnea Score 4   Score 4 or greater  Results sent to PCP

## 2012-03-15 ENCOUNTER — Observation Stay (HOSPITAL_COMMUNITY)
Admission: RE | Admit: 2012-03-15 | Discharge: 2012-03-16 | Disposition: A | Discharge status: Home / Self Care | Payer: Medicare Other | Source: Ambulatory Visit | Attending: Urology | Admitting: Urology

## 2012-03-15 ENCOUNTER — Encounter (HOSPITAL_COMMUNITY): Payer: Self-pay

## 2012-03-15 ENCOUNTER — Encounter (HOSPITAL_COMMUNITY): Payer: Self-pay | Admitting: Anesthesiology

## 2012-03-15 ENCOUNTER — Ambulatory Visit (HOSPITAL_COMMUNITY): Payer: Medicare Other | Admitting: Anesthesiology

## 2012-03-15 ENCOUNTER — Encounter (HOSPITAL_COMMUNITY)
Admission: RE | Disposition: A | Discharge status: Home / Self Care | Payer: Self-pay | Source: Ambulatory Visit | Attending: Urology

## 2012-03-15 DIAGNOSIS — N478 Other disorders of prepuce: Secondary | ICD-10-CM | POA: Insufficient documentation

## 2012-03-15 DIAGNOSIS — N529 Male erectile dysfunction, unspecified: Principal | ICD-10-CM | POA: Insufficient documentation

## 2012-03-15 DIAGNOSIS — Z951 Presence of aortocoronary bypass graft: Secondary | ICD-10-CM | POA: Insufficient documentation

## 2012-03-15 DIAGNOSIS — N471 Phimosis: Secondary | ICD-10-CM | POA: Insufficient documentation

## 2012-03-15 DIAGNOSIS — Z8673 Personal history of transient ischemic attack (TIA), and cerebral infarction without residual deficits: Secondary | ICD-10-CM | POA: Insufficient documentation

## 2012-03-15 DIAGNOSIS — I10 Essential (primary) hypertension: Secondary | ICD-10-CM | POA: Insufficient documentation

## 2012-03-15 DIAGNOSIS — E119 Type 2 diabetes mellitus without complications: Secondary | ICD-10-CM | POA: Insufficient documentation

## 2012-03-15 DIAGNOSIS — Z7902 Long term (current) use of antithrombotics/antiplatelets: Secondary | ICD-10-CM | POA: Insufficient documentation

## 2012-03-15 DIAGNOSIS — I2510 Atherosclerotic heart disease of native coronary artery without angina pectoris: Secondary | ICD-10-CM | POA: Insufficient documentation

## 2012-03-15 DIAGNOSIS — Z79899 Other long term (current) drug therapy: Secondary | ICD-10-CM | POA: Insufficient documentation

## 2012-03-15 HISTORY — PX: PENILE PROSTHESIS IMPLANT: SHX240

## 2012-03-15 HISTORY — PX: CIRCUMCISION: SHX1350

## 2012-03-15 LAB — GLUCOSE, CAPILLARY
Glucose-Capillary: 58 mg/dL — ABNORMAL LOW (ref 70–99)
Glucose-Capillary: 95 mg/dL (ref 70–99)
Glucose-Capillary: 79 mg/dL (ref 70–99)
Glucose-Capillary: 148 mg/dL — ABNORMAL HIGH (ref 70–99)

## 2012-03-15 SURGERY — INSERTION, PENILE PROSTHESIS, INFLATABLE
Anesthesia: General | Site: Penis | Wound class: Clean

## 2012-03-15 MED ORDER — SODIUM CHLORIDE 0.45 % IV SOLN
INTRAVENOUS | Status: DC
  Administered 2012-03-15 – 2012-03-16 (×3): via INTRAVENOUS

## 2012-03-15 MED ORDER — PROMETHAZINE HCL 25 MG/ML IJ SOLN: 6.2500 mg | INTRAMUSCULAR | Status: DC | PRN

## 2012-03-15 MED ORDER — GLIMEPIRIDE 4 MG PO TABS
4.0000 mg | ORAL_TABLET | Freq: Every day | ORAL | Status: DC
  Filled 2012-03-15 (×3): qty 1

## 2012-03-15 MED ORDER — GENTAMICIN SULFATE 40 MG/ML IJ SOLN
416.0000 mg | INTRAVENOUS | Status: DC | PRN
  Administered 2012-03-15: 380 mg via INTRAVENOUS

## 2012-03-15 MED ORDER — LACTATED RINGERS IV SOLN
INTRAVENOUS | Status: DC
  Administered 2012-03-15 (×2): via INTRAVENOUS

## 2012-03-15 MED ORDER — OXYCODONE-ACETAMINOPHEN 5-325 MG PO TABS: 1.0000 | ORAL_TABLET | ORAL | Status: DC | PRN

## 2012-03-15 MED ORDER — SILDENAFIL CITRATE 20 MG PO TABS
100.0000 mg | ORAL_TABLET | Freq: Every day | ORAL | Status: DC | PRN
  Filled 2012-03-15: qty 5

## 2012-03-15 MED ORDER — ONDANSETRON HCL 4 MG/2ML IJ SOLN
INTRAMUSCULAR | Status: DC | PRN
  Administered 2012-03-15: 4 mg via INTRAVENOUS

## 2012-03-15 MED ORDER — INSULIN ASPART 100 UNIT/ML ~~LOC~~ SOLN
0.0000 [IU] | Freq: Three times a day (TID) | SUBCUTANEOUS | Status: DC
  Administered 2012-03-16: 1 [IU] via SUBCUTANEOUS

## 2012-03-15 MED ORDER — HYDROCHLOROTHIAZIDE 25 MG PO TABS
25.0000 mg | ORAL_TABLET | Freq: Every day | ORAL | Status: DC
  Administered 2012-03-15: 25 mg via ORAL
  Filled 2012-03-15 (×2): qty 1

## 2012-03-15 MED ORDER — ATORVASTATIN CALCIUM 40 MG PO TABS
40.0000 mg | ORAL_TABLET | Freq: Every day | ORAL | Status: DC
Start: 2012-03-15 — End: 2012-03-16
  Administered 2012-03-15: 40 mg via ORAL
  Filled 2012-03-15 (×2): qty 1

## 2012-03-15 MED ORDER — GENTAMICIN SULFATE 40 MG/ML IJ SOLN
490.0000 mg | INTRAVENOUS | Status: DC
  Administered 2012-03-16: 490 mg via INTRAVENOUS
  Filled 2012-03-15: qty 12.25

## 2012-03-15 MED ORDER — GENTAMICIN SULFATE 40 MG/ML IJ SOLN
360.0000 mg | INTRAVENOUS | Status: DC
  Filled 2012-03-15: qty 9

## 2012-03-15 MED ORDER — QUETIAPINE FUMARATE 400 MG PO TABS
400.0000 mg | ORAL_TABLET | Freq: Every day | ORAL | Status: DC
  Filled 2012-03-15: qty 1

## 2012-03-15 MED ORDER — GLYCOPYRROLATE 0.2 MG/ML IJ SOLN
INTRAMUSCULAR | Status: DC | PRN
  Administered 2012-03-15: .7 mg via INTRAVENOUS

## 2012-03-15 MED ORDER — ROCURONIUM BROMIDE 100 MG/10ML IV SOLN
INTRAVENOUS | Status: DC | PRN
  Administered 2012-03-15: 50 mg via INTRAVENOUS

## 2012-03-15 MED ORDER — CEFAZOLIN SODIUM-DEXTROSE 2-3 GM-% IV SOLR
2.0000 g | INTRAVENOUS | Status: AC
  Administered 2012-03-15: 2 g via INTRAVENOUS

## 2012-03-15 MED ORDER — FENTANYL CITRATE 0.05 MG/ML IJ SOLN: 25.0000 ug | INTRAMUSCULAR | Status: DC | PRN

## 2012-03-15 MED ORDER — CEFAZOLIN SODIUM 1-5 GM-% IV SOLN
1.0000 g | Freq: Three times a day (TID) | INTRAVENOUS | Status: DC
  Administered 2012-03-15 – 2012-03-16 (×2): 1 g via INTRAVENOUS
  Filled 2012-03-15 (×4): qty 50

## 2012-03-15 MED ORDER — INSULIN GLARGINE 100 UNIT/ML ~~LOC~~ SOLN
30.0000 [IU] | Freq: Two times a day (BID) | SUBCUTANEOUS | Status: DC
  Administered 2012-03-15: 30 [IU] via SUBCUTANEOUS

## 2012-03-15 MED ORDER — ZOLPIDEM TARTRATE 5 MG PO TABS: 5.0000 mg | ORAL_TABLET | Freq: Every evening | ORAL | Status: DC | PRN

## 2012-03-15 MED ORDER — INSULIN ASPART 100 UNIT/ML ~~LOC~~ SOLN: 0.0000 [IU] | Freq: Every day | SUBCUTANEOUS | Status: DC

## 2012-03-15 MED ORDER — 0.9 % SODIUM CHLORIDE (POUR BTL) OPTIME
TOPICAL | Status: DC | PRN
  Administered 2012-03-15: 1000 mL

## 2012-03-15 MED ORDER — HYDROMORPHONE HCL PF 1 MG/ML IJ SOLN: 0.5000 mg | INTRAMUSCULAR | Status: DC | PRN

## 2012-03-15 MED ORDER — BENAZEPRIL HCL 40 MG PO TABS
40.0000 mg | ORAL_TABLET | Freq: Every day | ORAL | Status: DC
  Administered 2012-03-15: 40 mg via ORAL
  Filled 2012-03-15 (×2): qty 1

## 2012-03-15 MED ORDER — ONDANSETRON HCL 4 MG/2ML IJ SOLN: 4.0000 mg | INTRAMUSCULAR | Status: DC | PRN

## 2012-03-15 MED ORDER — PNEUMOCOCCAL VAC POLYVALENT 25 MCG/0.5ML IJ INJ
0.5000 mL | INJECTION | INTRAMUSCULAR | Status: DC
  Filled 2012-03-15 (×2): qty 0.5

## 2012-03-15 MED ORDER — DEXTROSE 50 % IV SOLN
INTRAVENOUS | Status: DC | PRN
  Administered 2012-03-15: 6.25 g via INTRAVENOUS

## 2012-03-15 MED ORDER — FENTANYL CITRATE 0.05 MG/ML IJ SOLN
INTRAMUSCULAR | Status: DC | PRN
  Administered 2012-03-15 (×2): 50 ug via INTRAVENOUS
  Administered 2012-03-15: 100 ug via INTRAVENOUS
  Administered 2012-03-15: 50 ug via INTRAVENOUS

## 2012-03-15 MED ORDER — GENTAMICIN IN SALINE 1-0.9 MG/ML-% IV SOLN: 100.0000 mg | INTRAVENOUS | Status: DC

## 2012-03-15 MED ORDER — HYDROMORPHONE HCL PF 1 MG/ML IJ SOLN
INTRAMUSCULAR | Status: DC | PRN
  Administered 2012-03-15 (×2): 0.5 mg via INTRAVENOUS
  Administered 2012-03-15 (×2): 1 mg via INTRAVENOUS

## 2012-03-15 MED ORDER — DEXTROSE 50 % IV SOLN
0.5000 | Freq: Once | INTRAVENOUS | Status: AC
  Administered 2012-03-15: 25 mL via INTRAVENOUS
  Filled 2012-03-15: qty 50

## 2012-03-15 MED ORDER — LIDOCAINE HCL (CARDIAC) 20 MG/ML IV SOLN
INTRAVENOUS | Status: DC | PRN
  Administered 2012-03-15: 50 mg via INTRAVENOUS

## 2012-03-15 MED ORDER — NEOSTIGMINE METHYLSULFATE 1 MG/ML IJ SOLN
INTRAMUSCULAR | Status: DC | PRN
  Administered 2012-03-15: 4 mg via INTRAVENOUS

## 2012-03-15 MED ORDER — QUETIAPINE FUMARATE 200 MG PO TABS
200.0000 mg | ORAL_TABLET | Freq: Every day | ORAL | Status: DC
  Filled 2012-03-15: qty 1

## 2012-03-15 MED ORDER — SODIUM CHLORIDE 0.9 % IR SOLN
Status: DC | PRN
  Administered 2012-03-15: 11:00:00

## 2012-03-15 MED ORDER — QUETIAPINE FUMARATE 50 MG PO TABS
50.0000 mg | ORAL_TABLET | Freq: Once | ORAL | Status: AC
  Administered 2012-03-16: 50 mg via ORAL
  Filled 2012-03-15: qty 1

## 2012-03-15 MED ORDER — AMLODIPINE BESYLATE 10 MG PO TABS
10.0000 mg | ORAL_TABLET | Freq: Every day | ORAL | Status: DC
  Administered 2012-03-15: 10 mg via ORAL
  Filled 2012-03-15 (×2): qty 1

## 2012-03-15 MED ORDER — METOPROLOL TARTRATE 12.5 MG HALF TABLET
12.5000 mg | ORAL_TABLET | Freq: Two times a day (BID) | ORAL | Status: DC
Start: 2012-03-15 — End: 2012-03-16
  Administered 2012-03-15: 12.5 mg via ORAL
  Filled 2012-03-15 (×3): qty 1

## 2012-03-15 MED ORDER — PROPOFOL 10 MG/ML IV BOLUS
INTRAVENOUS | Status: DC | PRN
  Administered 2012-03-15: 200 mg via INTRAVENOUS

## 2012-03-15 SURGICAL SUPPLY — 68 items
APL SKNCLS STERI-STRIP NONHPOA (GAUZE/BANDAGES/DRESSINGS)
BAG URINE DRAINAGE (UROLOGICAL SUPPLIES) ×2 IMPLANT
BANDAGE COBAN STERILE 2 (GAUZE/BANDAGES/DRESSINGS) IMPLANT
BANDAGE CONFORM 2  STR LF (GAUZE/BANDAGES/DRESSINGS) IMPLANT
BANDAGE GAUZE ELAST BULKY 4 IN (GAUZE/BANDAGES/DRESSINGS) ×1 IMPLANT
BENZOIN TINCTURE PRP APPL 2/3 (GAUZE/BANDAGES/DRESSINGS) ×1 IMPLANT
BLADE HEX COATED 2.75 (ELECTRODE) ×2 IMPLANT
BLADE SURG 15 STRL LF DISP TIS (BLADE) ×2 IMPLANT
BLADE SURG 15 STRL SS (BLADE) ×4
BLADE SURG ROTATE 9660 (MISCELLANEOUS) ×1 IMPLANT
BNDG COHESIVE 1X5 TAN STRL LF (GAUZE/BANDAGES/DRESSINGS) ×1 IMPLANT
CANISTER SUCTION 2500CC (MISCELLANEOUS) ×2 IMPLANT
CATH FOLEY 2WAY SLVR  5CC 16FR (CATHETERS) ×1
CATH FOLEY 2WAY SLVR 5CC 16FR (CATHETERS) ×1 IMPLANT
CHLORAPREP W/TINT 26ML (MISCELLANEOUS) ×4 IMPLANT
CLOTH BEACON ORANGE TIMEOUT ST (SAFETY) ×2 IMPLANT
COVER MAYO STAND STRL (DRAPES) ×2 IMPLANT
COVER SURGICAL LIGHT HANDLE (MISCELLANEOUS) ×2 IMPLANT
CYL ST 18CM INFRAPUB OTR 0 (KITS) ×1 IMPLANT
DECANTER SPIKE VIAL GLASS SM (MISCELLANEOUS) IMPLANT
DISSECTOR ROUND CHERRY 3/8 STR (MISCELLANEOUS) IMPLANT
DRAPE LAPAROTOMY T 102X78X121 (DRAPES) ×2 IMPLANT
DRSG TEGADERM 4X4.75 (GAUZE/BANDAGES/DRESSINGS) ×2 IMPLANT
DRSG TEGADERM 8X12 (GAUZE/BANDAGES/DRESSINGS) ×2 IMPLANT
ELECT NEEDLE TIP 2.8 STRL (NEEDLE) IMPLANT
ELECT REM PT RETURN 9FT ADLT (ELECTROSURGICAL) ×2
ELECTRODE REM PT RTRN 9FT ADLT (ELECTROSURGICAL) ×1 IMPLANT
GAUZE SPONGE 2X2 8PLY STRL LF (GAUZE/BANDAGES/DRESSINGS) ×1 IMPLANT
GAUZE SPONGE 4X4 16PLY XRAY LF (GAUZE/BANDAGES/DRESSINGS) ×2 IMPLANT
GAUZE VASELINE 1X8 (GAUZE/BANDAGES/DRESSINGS) ×2 IMPLANT
GAUZE VASELINE 3X9 (GAUZE/BANDAGES/DRESSINGS) IMPLANT
GLOVE BIO SURGEON STRL SZ 6.5 (GLOVE) ×1 IMPLANT
GLOVE BIOGEL M 7.0 STRL (GLOVE) ×2 IMPLANT
GLOVE BIOGEL PI IND STRL 7.5 (GLOVE) IMPLANT
GLOVE BIOGEL PI INDICATOR 7.5 (GLOVE) ×1
GLOVE INDICATOR 6.5 STRL GRN (GLOVE) ×2 IMPLANT
GOWN STRL NON-REIN LRG LVL3 (GOWN DISPOSABLE) ×2 IMPLANT
KIT BASIN OR (CUSTOM PROCEDURE TRAY) ×2 IMPLANT
LUBRICANT JELLY ST 5GR 8946 (MISCELLANEOUS) ×10 IMPLANT
NDL HYPO 25X1 1.5 SAFETY (NEEDLE) IMPLANT
NEEDLE HYPO 25X1 1.5 SAFETY (NEEDLE) IMPLANT
NS IRRIG 1000ML POUR BTL (IV SOLUTION) ×2 IMPLANT
PACK GENERAL/GYN (CUSTOM PROCEDURE TRAY) ×2 IMPLANT
PLUG CATH AND CAP STER (CATHETERS) ×2 IMPLANT
RESERVOIR 75CC LOCKOUT BIOFLEX (Erectile Restoration) ×1 IMPLANT
RETRACTOR WILSON SYSTEM (INSTRUMENTS) IMPLANT
SOL PREP POV-IOD 16OZ 10% (MISCELLANEOUS) ×2 IMPLANT
SOL PREP PROV IODINE SCRUB 4OZ (MISCELLANEOUS) ×2 IMPLANT
SPONGE GAUZE 2X2 STER 10/PKG (GAUZE/BANDAGES/DRESSINGS) ×1
SPONGE GAUZE 4X4 12PLY (GAUZE/BANDAGES/DRESSINGS) ×2 IMPLANT
SPONGE LAP 4X18 X RAY DECT (DISPOSABLE) IMPLANT
STRIP CLOSURE SKIN 1/2X4 (GAUZE/BANDAGES/DRESSINGS) ×1 IMPLANT
SUPPORT SCROTAL LG STRP (MISCELLANEOUS) ×3 IMPLANT
SUT CHROMIC 4 0 P 3 18 (SUTURE) IMPLANT
SUT CHROMIC 4 0 PS 2 18 (SUTURE) ×4 IMPLANT
SUT MNCRL AB 4-0 PS2 18 (SUTURE) ×2 IMPLANT
SUT VIC AB 0 UR5 27 (SUTURE) ×16 IMPLANT
SUT VIC AB 2-0 UR6 27 (SUTURE) IMPLANT
SUT VIC AB 3-0 SH 27 (SUTURE) ×4
SUT VIC AB 3-0 SH 27XBRD (SUTURE) ×2 IMPLANT
SUT VICRYL 0 UR6 27IN ABS (SUTURE) ×3 IMPLANT
SYR 20CC LL (SYRINGE) ×2 IMPLANT
SYR 50ML LL SCALE MARK (SYRINGE) ×4 IMPLANT
SYR CONTROL 10ML LL (SYRINGE) ×2 IMPLANT
TOWEL OR 17X26 10 PK STRL BLUE (TOWEL DISPOSABLE) ×2 IMPLANT
Titan (Erectile Restoration) ×1 IMPLANT
WATER STERILE IRR 1500ML POUR (IV SOLUTION) ×2 IMPLANT
titan cl reservoir (Erectile Restoration) IMPLANT

## 2012-03-15 NOTE — Progress Notes (Signed)
T: 97.8          P: 60                  R: 12                 BP: 153/64 No abdominal, penile or scrotal pain. Abdomen: Soft non distended.   Wound clean and dry Foley draining clear urine. Plan: Remove Foley tonight.  Probable discharge in AM.

## 2012-03-15 NOTE — Progress Notes (Signed)
ANTIBIOTIC CONSULT NOTE - INITIAL  Pharmacy Consult for Gentamicin Indication: Empiric coverage of post-op infection  No Known Allergies  Patient Measurements: Height: 5' 6.14" (168 cm) Weight: 183 lb 6.8 oz (83.2 kg) IBW/kg (Calculated) : 64.13  Adjusted Body Weight: 70 kg  Vital Signs: Temp: 97.8 F (36.6 C) (01/28 1520) BP: 162/64 mmHg (01/28 1500) Pulse Rate: 60  (01/28 1520) Intake/Output from previous day:   Intake/Output from this shift: Total I/O In: 1950 [I.V.:1900; IV Piggyback:50] Out: 270 [Urine:270]  Labs: No results found for this basename: WBC:3,HGB:3,PLT:3,LABCREA:3,CREATININE:3 in the last 72 hours Estimated Creatinine Clearance: 54.9 ml/min (by C-G formula based on Cr of 1.27). No results found for this basename: VANCOTROUGH:2,VANCOPEAK:2,VANCORANDOM:2,GENTTROUGH:2,GENTPEAK:2,GENTRANDOM:2,TOBRATROUGH:2,TOBRAPEAK:2,TOBRARND:2,AMIKACINPEAK:2,AMIKACINTROU:2,AMIKACIN:2, in the last 72 hours   Microbiology: Recent Results (from the past 720 hour(s))  SURGICAL PCR SCREEN     Status: Normal   Collection Time   03/03/12  9:30 AM      Component Value Range Status Comment   MRSA, PCR NEGATIVE  NEGATIVE Final    Staphylococcus aureus NEGATIVE  NEGATIVE Final     Medical History: Past Medical History  Diagnosis Date  . Coronary artery disease   . Hypertension   . Depression   . Diabetes mellitus without complication   . Stroke 72 years old  . GERD (gastroesophageal reflux disease)     hx of  . Arthritis     hx of  . Erectile dysfunction     Medications:  Scheduled:    .  ceFAZolin (ANCEF) IV  1 g Intravenous Q8H  . [COMPLETED]  ceFAZolin (ANCEF) IV  2 g Intravenous 30 min Pre-Op  . [COMPLETED] dextrose  0.5 ampule Intravenous Once  . [DISCONTINUED] gentamicin  360 mg Intravenous 30 min Pre-Op  . [DISCONTINUED] gentamicin  100 mg Intravenous 30 min Pre-Op   Infusions:    . sodium chloride    . lactated ringers     Assessment:  72 yr old male  s/p penile prosthesis  IV Ancef 1gm IV q8h and Gentamicin per RX ordered for post-op coverage of infection  Gentamicin 380 mg given pre-op @ 10:39 today  On 1/29 will begin Gentamicin 7 mg/kg (based on adjusted body weight) and will check 10 hr random level following that dose  Goal of Therapy:  Gentamicin level according to [place] nomogram (7 mg/kg)  Plan:   Gentamicin 490 IV q24h (to begin 03/16/12 @ 10:00)  Check gentamicin 10 hr random level after gentamicin dose on 1/29 is given  Belma Dyches, Joselyn Glassman, PharmD 03/15/2012,3:30 PM

## 2012-03-15 NOTE — Op Note (Signed)
[name] is a 72 y.o.   03/15/2012  Preop diagnosis: Erectile dysfunction, phimosis  Postop diagnosis: Same  Procedure done: Insertion of inflatable penile prosthesis, circumcision  Surgeon: Wendie Simmer. Zadie Deemer  Anesthesia: General  Indication: Patient is a 72 years old male who has a long history of erectile dysfunction. He has used PDE5. The erections are not satisfactory. He is not interested in penile injections. He would like to have a penile prosthesis. He elected to have an inflatable one. He has phimosis and has requested to have a circumcision at the same time. He is scheduled today for the procedures  Procedure: Patient was identified by his wrist band and proper timeout was taken.  Under general anesthesia he was prepped and draped and placed in the supine position. A #16 French Foley catheter was inserted in the bladder. Clear urine was drained out. Then a 4 inch  transverse incision was made in the infrapubic area. The incision was carried down to the subcutaneous tissues. The dissection was carried down to the corpora. The corpora were were then dissected from the surrounding tissues. 2 stay sutures of 0 Vicryl were placed on each corpus. Then a corporotomy was done on the right side. The right corpus was then bluntly dilated proximally and distally with the tip of the Yankauer suction. Then the right corpus was dilated proximally with Brooks dilators up to #12 [place]. The dilation was then carried distally with Brooks dilators up to #12 [place]. Then a corporotomy was done and the left side. The left corpus was then bluntly dilated proximally and distally with the tip of the Yankauer suction. Then dilation was carried out with [facility] dilators proximally and distally up to #12 [place]. With the sizer set the length of the right corpus was found to be 18 cm. And the left corpus also measured 18 cm.  The subcutaneous tissues were then dissected towards the suprapubic area. The rectus  fascia was then identified. The rectus fascia was then incised in a transverse fashion. The recti muscles were split in the mid line. Then the pouch for the reservoir was created bluntly on the left rectus muscle. A 75 cc reservoir was then passed through the incision under the rectus muscle. The rectus fascia was then closed with #0 Vicryl, care taken not to puncture the reservoir nor the tubing of the reservoir. Then the reservoir was inflated with 75 cc of normal saline. There was no back pressure.  Sutures of 0 Vicryl were placed on either side of each corporotomy.  An 18 cm cylinder prosthesis was then placed in each corpus with the Furlow inserter distally. The proximal end of the cylinder was then passed through the corporotomy in the proximal corpus. The tip of the prosthesis is in the mid gland. The tubing of the prosthesis was then attached to a 60 cc syringe and the prosthesis was inflated. There was no buckling of the prosthesis and each cylinder appears to be in good position. The preplaced sutures of 0 Vicryl were then approximated to close the corporotomies. The ends of the tubing of the inflate deflate pump and the reservoir were irrigated with normal saline. The tubing of the inflate deflate pump was then attached to the tubing of the reservoir using the quick connect. The prosthesis was then inflated using the inflate deflate pump and it was functioning properly. The wound was then irrigated with bug juice. Then the infrapubic subcutaneous tissues were closed in 2 layers with #3-0 Vicryl and  the skin was closed with #4-0 Monocryl.  Then 2 circumferential incisions were made on the foreskin and the foreskin in between those 2 incisions was excised. Hemostasis was secured with electrocautery. A frenulotomy was then done. Skin approximation was done with #4-0 chromic.  EBL: 150 cc  Needles, sponges count: Correct on 2 counts  Patient tolerated the procedure well and left the OR in  satisfactory condition to postanesthesia care unit.

## 2012-03-15 NOTE — Anesthesia Preprocedure Evaluation (Addendum)
Anesthesia Evaluation  Patient identified by MRN, date of birth, ID band Patient awake    Reviewed: Allergy & Precautions, H&P , NPO status , Patient's Chart, lab work & pertinent test results  Airway Mallampati: II TM Distance: >3 FB Neck ROM: Full    Dental No notable dental hx.    Pulmonary neg pulmonary ROS,  breath sounds clear to auscultation  Pulmonary exam normal       Cardiovascular hypertension, Pt. on medications and Pt. on home beta blockers + CAD Rhythm:Regular Rate:Normal     Neuro/Psych PSYCHIATRIC DISORDERS Depression CVA    GI/Hepatic Neg liver ROS, GERD-  ,  Endo/Other  diabetes, Type 2, Oral Hypoglycemic Agents and Insulin Dependent  Renal/GU negative Renal ROS  negative genitourinary   Musculoskeletal negative musculoskeletal ROS (+)   Abdominal   Peds negative pediatric ROS (+)  Hematology negative hematology ROS (+)   Anesthesia Other Findings   Reproductive/Obstetrics negative OB ROS                           Anesthesia Physical Anesthesia Plan  ASA: III  Anesthesia Plan: General   Post-op Pain Management:    Induction: Intravenous  Airway Management Planned: Oral ETT  Additional Equipment:   Intra-op Plan:   Post-operative Plan: Extubation in OR  Informed Consent: I have reviewed the patients History and Physical, chart, labs and discussed the procedure including the risks, benefits and alternatives for the proposed anesthesia with the patient or authorized representative who has indicated his/her understanding and acceptance.   Dental advisory given  Plan Discussed with: CRNA  Anesthesia Plan Comments:        Anesthesia Quick Evaluation

## 2012-03-15 NOTE — Transfer of Care (Signed)
Immediate Anesthesia Transfer of Care Note  Patient: [name]  Procedure(s) Performed: Procedure(s) (LRB): PENILE PROTHESIS INFLATABLE (N/A) CIRCUMCISION ADULT (N/A)  Patient Location: PACU  Anesthesia Type: General  Level of Consciousness: sedated, patient cooperative and responds to stimulaton  Airway & Oxygen Therapy: Patient Spontanous Breathing and Patient connected to face mask oxgen  Post-op Assessment: Report given to PACU RN and Post -op Vital signs reviewed and stable  Post vital signs: Reviewed and stable  Complications: No apparent anesthesia complications

## 2012-03-15 NOTE — H&P (Signed)
History and Physical  Chief Complaint: Inability to have erections  History of Present Illness: [name] is a 72 years old male who has been complaining of difficulty achieving erections for several years. He has used  PDE 5 without any success. He is not interested in using penile injections. He would like to have a penile prosthesis. And he chose to have an inflatable prosthesis. He is scheduled today for the procedure  Past Medical History  Diagnosis Date  . Coronary artery disease   . Hypertension   . Depression   . Diabetes mellitus without complication   . Stroke 72 years old  . GERD (gastroesophageal reflux disease)     hx of  . Arthritis     hx of  . Erectile dysfunction    Past Surgical History  Procedure Date  . Coronary artery bypass graft   . Eye surgery     cataracts bilateral  . Carpal tunnel release     bilateral  . Tonsillectomy     Medications: Amaryl, amlodipine, benazepril, Crestor, Cymbalta, Effexor, glimeperide, Glucophage, hydrochlorothiazide, Lantus, metformin, Novolin, Plavix, Rapaflo, Seroquel, Toprol XL , trazodone,Vesicare  Allergies: No Known Allergies  No family history on file. Social History:  reports that he has never smoked. He has never used smokeless tobacco. He reports that he does not drink alcohol or use illicit drugs.  ROS: All systems are reviewed and negative except as noted.   Physical Exam:  Vital signs in last 24 hours: Temp:  [97.3 F (36.3 C)] 97.3 F (36.3 C) (01/28 0744) Pulse Rate:  [64] 64  (01/28 0744) Resp:  [20] 20  (01/28 0744) BP: (131)/(69) 131/69 mmHg (01/28 0744) SpO2:  [96 %] 96 % (01/28 0744)  Cardiovascular: Skin warm; not flushed Respiratory: Breaths quiet; no shortness of breath Abdomen: No masses Neurological: Normal sensation to touch Musculoskeletal: Normal motor function arms and legs Lymphatics: No inguinal adenopathy Skin: No rashes Genitourinary:Penis is uncircumcised.  Has  phimosis. Scrotum is unremarkable.  Testicles are normal.  Laboratory Data:  No results found for this or any previous visit (from the past 24 hour(s)). No results found for this or any previous visit (from the past 240 hour(s)). Creatinine: No results found for this basename: CREATININE:7 in the last 168 hours    Impression/Assessment:  Erectile dysfunction.  Phimosis  Plan:  Insertion of inflatable penile prosthesis.  Circumcision  [name] 03/15/2012, 8:04 AM

## 2012-03-15 NOTE — Anesthesia Postprocedure Evaluation (Signed)
  Anesthesia Post-op Note  Patient: [name]  Procedure(s) Performed: Procedure(s) (LRB): PENILE PROTHESIS INFLATABLE (N/A) CIRCUMCISION ADULT (N/A)  Patient Location: PACU  Anesthesia Type: General  Level of Consciousness: awake and alert   Airway and Oxygen Therapy: Patient Spontanous Breathing  Post-op Pain: mild  Post-op Assessment: Post-op Vital signs reviewed, Patient's Cardiovascular Status Stable, Respiratory Function Stable, Patent Airway and No signs of Nausea or vomiting  Last Vitals:  Filed Vitals:   03/15/12 1445  BP: 151/72  Pulse: 75  Temp:   Resp: 12    Post-op Vital Signs: stable   Complications: No apparent anesthesia complications

## 2012-03-15 NOTE — Progress Notes (Signed)
Call to Dr Council Mechanic re: 58 CBG. Patient asymptomatic

## 2012-03-16 ENCOUNTER — Encounter (HOSPITAL_COMMUNITY): Payer: Self-pay | Admitting: Urology

## 2012-03-16 LAB — GLUCOSE, CAPILLARY
Glucose-Capillary: 85 mg/dL (ref 70–99)
Glucose-Capillary: 134 mg/dL — ABNORMAL HIGH (ref 70–99)

## 2012-03-16 LAB — TROPONIN I: Troponin I: <0.3 ng/mL (ref <0.30)

## 2012-03-16 MED ORDER — HYDROCODONE-ACETAMINOPHEN 5-500 MG PO TABS: 1.0000 | ORAL_TABLET | Freq: Four times a day (QID) | ORAL | Status: DC | PRN

## 2012-03-16 MED ORDER — NITROGLYCERIN 0.4 MG SL SUBL
0.4000 mg | SUBLINGUAL_TABLET | SUBLINGUAL | Status: DC | PRN
  Administered 2012-03-16 (×6): 0.4 mg via SUBLINGUAL
  Filled 2012-03-16: qty 25

## 2012-03-16 MED ORDER — NITROGLYCERIN 0.4 MG SL SUBL: 0.4000 mg | SUBLINGUAL_TABLET | SUBLINGUAL | Status: DC | PRN

## 2012-03-16 MED ORDER — TAMSULOSIN HCL 0.4 MG PO CAPS
0.4000 mg | ORAL_CAPSULE | Freq: Once | ORAL | Status: AC
  Administered 2012-03-16: 0.4 mg via ORAL
  Filled 2012-03-16: qty 1

## 2012-03-16 MED ORDER — CEPHALEXIN 500 MG PO CAPS: 500.0000 mg | ORAL_CAPSULE | Freq: Four times a day (QID) | ORAL | Status: DC

## 2012-03-16 NOTE — Progress Notes (Signed)
Nutrition Brief Note  Patient identified on the Malnutrition Screening Tool (MST) Report  Body mass index is 29.48 kg/(m^2). Patient meets criteria for overweight based on current BMI.   Current diet order is CHO Mod Med. Labs and medications reviewed.   Pt admitted for penile surgery.  Pt reports change in appetite and intake since CABG x5 5 years ago.  He states he lost from 238 lbs to 180 lbs over the past few years.  Reports he feels his nutrition is adequate and denies nutrition-related concerns.  No nutrition interventions warranted at this time. If nutrition issues arise, please consult RD.   [name] Dys, MS RD LDN Clinical Inpatient Dietitian Pager: [phone] Weekend/After hours pager: [phone]

## 2012-03-16 NOTE — Care Management Note (Signed)
    Page 1 of 1   03/16/2012     2:40:09 PM   CARE MANAGEMENT NOTE 03/16/2012  Patient:  [name], [name]   Account Number:  [id number]  Date Initiated:  03/16/2012  Documentation initiated by:  Lanier Clam  Subjective/Objective Assessment:   ADMITTED W/ERECTILE DYSFUNCTION.     Action/Plan:   FROM HOME ALONE.HAS PCP,PHARMACY.   Anticipated DC Date:  03/17/2012   Anticipated DC Plan:  HOME/SELF CARE      DC Planning Services  CM consult      Choice offered to / List presented to:             Status of service:  Completed, signed off Medicare Important Message given?   (If response is "NO", the following Medicare IM given date fields will be blank) Date Medicare IM given:   Date Additional Medicare IM given:    Discharge Disposition:  HOME/SELF CARE  Per UR Regulation:  Reviewed for med. necessity/level of care/duration of stay  If discussed at Long Length of Stay Meetings, dates discussed:    Comments:  03/16/12 Beuna Bolding RN,BSN NCM [phone] S/P PENILE PROSTHESIS.

## 2012-03-16 NOTE — Progress Notes (Signed)
Patient complaining of chest pain. He had chest pain earlier this morning prior to my shift and was given nitroglycerin SL x3 which relieved his pain. EKG was performed. MD was paged with these findings and new orders received for cardiac enzymes x3. At 1020, patient complained of chest pain again and was given nitroglycerin SLx3. Pain was relieved after the third dose.  Troponin negative at this time.  Will continue to monitor.

## 2012-03-16 NOTE — Discharge Summary (Signed)
Patient ID: [name] MRN: [id number] DOB/AGE: 08/22/1940 72 y.o.  Admit date: 03/15/2012 Discharge date: 03/16/2012  Admission Diagnoses: erectile dysfunction  Discharge Diagnoses: Erectile dysfunction.  Phimosis.  Diabetes. Coronary artery disease   Discharged Condition:Improved  Hospital Course: The patient had an inflatable penile prosthesis on 1/28.  He experienced chest pain the day after surgery.  The pain subsided after taking nitroglycerine.  EKG showed no changes.  Troponin levels are within normal limits.  I discussed those those findings with Dr [name].  Since he does not have any more chest pain, troponin levels are within normal limits, he feels that the patient can be discharged home and be followed as outpatient.  He is instructed not to do any lifting, straining or driving.  He may shower.  I advised him to call his cardiologist office for follow-up.     Significant Diagnostic Studies: EKG Treatments:Insertion of inflatable penile prosthesis.  Circumcision  Discharge Exam: Blood pressure 109/54, pulse 66, temperature 98.7 F (37.1 C), temperature source Oral, resp. rate 18, height 5' 6.14" (1.68 m), weight 183 lb 6.8 oz (83.2 kg), SpO2 98.00%. Lungs clear Abdomen: Soft, non distended, non tender. Wounds clean and dry. Minimal scrotal swelling.  Disposition: 01-Home or Self Care     Medication List     As of 03/16/2012  2:19 PM    STOP taking these medications         sildenafil 100 MG tablet   Commonly known as: VIAGRA      TAKE these medications         amLODipine-benazepril 10-40 MG per capsule   Commonly known as: LOTREL   Take 1 capsule by mouth daily.      aspirin 325 MG EC tablet   Take 325 mg by mouth daily.      atorvastatin 40 MG tablet   Commonly known as: LIPITOR   Take 40 mg by mouth daily.      cephALEXin 500 MG capsule   Commonly known as: KEFLEX   Take 1 capsule (500 mg total) by mouth 4 (four) times daily.     cholecalciferol 1000 UNITS tablet   Commonly known as: VITAMIN D   Take 1,000 Units by mouth daily.      clopidogrel 75 MG tablet   Commonly known as: PLAVIX   Take 75 mg by mouth daily.      glimepiride 4 MG tablet   Commonly known as: AMARYL   Take 4 mg by mouth daily before breakfast.      hydrochlorothiazide 25 MG tablet   Commonly known as: HYDRODIURIL   Take 25 mg by mouth daily.      HYDROcodone-acetaminophen 5-500 MG per tablet   Commonly known as: VICODIN   Take 1 tablet by mouth every 6 (six) hours as needed for pain.      insulin aspart protamine-insulin aspart (70-30) 100 UNIT/ML injection   Commonly known as: NOVOLOG 70/30   Inject into the skin as directed.      insulin glargine 100 UNIT/ML injection   Commonly known as: LANTUS   Inject into the skin 2 (two) times daily.      metFORMIN 1000 MG tablet   Commonly known as: GLUCOPHAGE   Take 1,000 mg by mouth 2 (two) times daily with a meal.      metoprolol tartrate 25 MG tablet   Commonly known as: LOPRESSOR   Take 12.5 mg by mouth 2 (two) times daily. SCHEDULED TWICE DAILY BUT PATIENT  TAKES BOTH HALVES AT 730 PM      nitroGLYCERIN 0.4 MG SL tablet   Commonly known as: NITROSTAT   Place 1 tablet (0.4 mg total) under the tongue every 5 (five) minutes as needed for chest pain.      QUEtiapine 400 MG tablet   Commonly known as: SEROQUEL   Take 400 mg by mouth at bedtime.           Follow-up Information    Follow up with [name] Mull-HENRY, MD.   Contact information:   [address] [place] [address] [phone]          Signed: Deondrea [name] 03/16/2012, 2:19 PM

## 2012-03-16 NOTE — Progress Notes (Signed)
Pt voided 125cc of clear, yellow urine on his own. Will chart output and continue to monitor pt. - Christell Faith, RN

## 2012-03-16 NOTE — Progress Notes (Addendum)
Pt's foley d/c'd during day shift at 1830 per RN report. Pt urinated at 0050 this morning, but amount looked small and soaked in to dressing surrounding pt's penis. Bladder scan showed 506cc residual. Paged MD on call, Timmothy Sours. She ordered one time dose of Tamsulosin 0.4 mg and In&Out catheterization. Administered med and got 600cc from In&Out cath. Pt needs to void again before 0740 in the am. Will pass this on to day shift RN during change of shift if pt has not voided. Will continue to monitor pt. - Christell Faith, RN

## 2012-04-05 ENCOUNTER — Inpatient Hospital Stay (HOSPITAL_COMMUNITY): Payer: Medicare Other

## 2012-04-05 ENCOUNTER — Inpatient Hospital Stay (HOSPITAL_COMMUNITY)
Admission: EM | Admit: 2012-04-05 | Discharge: 2012-04-23 | DRG: 326 | Disposition: A | Discharge status: Home Health | Payer: Medicare Other | Attending: Internal Medicine | Admitting: Internal Medicine

## 2012-04-05 ENCOUNTER — Emergency Department (HOSPITAL_COMMUNITY): Payer: Medicare Other

## 2012-04-05 ENCOUNTER — Encounter (HOSPITAL_COMMUNITY): Payer: Self-pay | Admitting: *Deleted

## 2012-04-05 DIAGNOSIS — IMO0002 Reserved for concepts with insufficient information to code with codable children: Secondary | ICD-10-CM | POA: Diagnosis present

## 2012-04-05 DIAGNOSIS — I4891 Unspecified atrial fibrillation: Secondary | ICD-10-CM

## 2012-04-05 DIAGNOSIS — K922 Gastrointestinal hemorrhage, unspecified: Secondary | ICD-10-CM

## 2012-04-05 DIAGNOSIS — D72829 Elevated white blood cell count, unspecified: Secondary | ICD-10-CM

## 2012-04-05 DIAGNOSIS — G9349 Other encephalopathy: Secondary | ICD-10-CM | POA: Diagnosis present

## 2012-04-05 DIAGNOSIS — I10 Essential (primary) hypertension: Secondary | ICD-10-CM

## 2012-04-05 DIAGNOSIS — K219 Gastro-esophageal reflux disease without esophagitis: Secondary | ICD-10-CM | POA: Diagnosis present

## 2012-04-05 DIAGNOSIS — Z951 Presence of aortocoronary bypass graft: Secondary | ICD-10-CM

## 2012-04-05 DIAGNOSIS — F329 Major depressive disorder, single episode, unspecified: Secondary | ICD-10-CM | POA: Diagnosis present

## 2012-04-05 DIAGNOSIS — I2510 Atherosclerotic heart disease of native coronary artery without angina pectoris: Secondary | ICD-10-CM | POA: Diagnosis present

## 2012-04-05 DIAGNOSIS — D500 Iron deficiency anemia secondary to blood loss (chronic): Secondary | ICD-10-CM

## 2012-04-05 DIAGNOSIS — E876 Hypokalemia: Secondary | ICD-10-CM | POA: Diagnosis not present

## 2012-04-05 DIAGNOSIS — E872 Acidosis, unspecified: Secondary | ICD-10-CM

## 2012-04-05 DIAGNOSIS — E1165 Type 2 diabetes mellitus with hyperglycemia: Secondary | ICD-10-CM | POA: Diagnosis present

## 2012-04-05 DIAGNOSIS — E119 Type 2 diabetes mellitus without complications: Secondary | ICD-10-CM

## 2012-04-05 DIAGNOSIS — K560 Paralytic ileus: Secondary | ICD-10-CM | POA: Diagnosis not present

## 2012-04-05 DIAGNOSIS — B9689 Other specified bacterial agents as the cause of diseases classified elsewhere: Secondary | ICD-10-CM | POA: Diagnosis present

## 2012-04-05 DIAGNOSIS — R4182 Altered mental status, unspecified: Secondary | ICD-10-CM

## 2012-04-05 DIAGNOSIS — N390 Urinary tract infection, site not specified: Secondary | ICD-10-CM | POA: Diagnosis present

## 2012-04-05 DIAGNOSIS — T40605A Adverse effect of unspecified narcotics, initial encounter: Secondary | ICD-10-CM | POA: Diagnosis not present

## 2012-04-05 DIAGNOSIS — R739 Hyperglycemia, unspecified: Secondary | ICD-10-CM

## 2012-04-05 DIAGNOSIS — Z6830 Body mass index (BMI) 30.0-30.9, adult: Secondary | ICD-10-CM

## 2012-04-05 DIAGNOSIS — F32A Depression, unspecified: Secondary | ICD-10-CM

## 2012-04-05 DIAGNOSIS — Z66 Do not resuscitate: Secondary | ICD-10-CM | POA: Diagnosis present

## 2012-04-05 DIAGNOSIS — N182 Chronic kidney disease, stage 2 (mild): Secondary | ICD-10-CM | POA: Diagnosis present

## 2012-04-05 DIAGNOSIS — I480 Paroxysmal atrial fibrillation: Secondary | ICD-10-CM | POA: Diagnosis present

## 2012-04-05 DIAGNOSIS — D649 Anemia, unspecified: Secondary | ICD-10-CM

## 2012-04-05 DIAGNOSIS — L299 Pruritus, unspecified: Secondary | ICD-10-CM | POA: Diagnosis not present

## 2012-04-05 DIAGNOSIS — R634 Abnormal weight loss: Secondary | ICD-10-CM | POA: Diagnosis present

## 2012-04-05 DIAGNOSIS — D62 Acute posthemorrhagic anemia: Secondary | ICD-10-CM

## 2012-04-05 DIAGNOSIS — R1902 Left upper quadrant abdominal swelling, mass and lump: Secondary | ICD-10-CM

## 2012-04-05 DIAGNOSIS — I129 Hypertensive chronic kidney disease with stage 1 through stage 4 chronic kidney disease, or unspecified chronic kidney disease: Secondary | ICD-10-CM | POA: Diagnosis present

## 2012-04-05 DIAGNOSIS — E43 Unspecified severe protein-calorie malnutrition: Secondary | ICD-10-CM | POA: Diagnosis present

## 2012-04-05 DIAGNOSIS — Z8673 Personal history of transient ischemic attack (TIA), and cerebral infarction without residual deficits: Secondary | ICD-10-CM

## 2012-04-05 DIAGNOSIS — Z794 Long term (current) use of insulin: Secondary | ICD-10-CM

## 2012-04-05 DIAGNOSIS — F3289 Other specified depressive episodes: Secondary | ICD-10-CM | POA: Diagnosis present

## 2012-04-05 DIAGNOSIS — N529 Male erectile dysfunction, unspecified: Secondary | ICD-10-CM

## 2012-04-05 DIAGNOSIS — M129 Arthropathy, unspecified: Secondary | ICD-10-CM | POA: Diagnosis present

## 2012-04-05 DIAGNOSIS — E870 Hyperosmolality and hypernatremia: Secondary | ICD-10-CM | POA: Diagnosis not present

## 2012-04-05 DIAGNOSIS — N179 Acute kidney failure, unspecified: Secondary | ICD-10-CM

## 2012-04-05 DIAGNOSIS — E878 Other disorders of electrolyte and fluid balance, not elsewhere classified: Secondary | ICD-10-CM | POA: Diagnosis not present

## 2012-04-05 DIAGNOSIS — C169 Malignant neoplasm of stomach, unspecified: Principal | ICD-10-CM | POA: Diagnosis present

## 2012-04-05 DIAGNOSIS — N189 Chronic kidney disease, unspecified: Secondary | ICD-10-CM

## 2012-04-05 DIAGNOSIS — Z7982 Long term (current) use of aspirin: Secondary | ICD-10-CM

## 2012-04-05 DIAGNOSIS — G4700 Insomnia, unspecified: Secondary | ICD-10-CM | POA: Diagnosis present

## 2012-04-05 LAB — CBC WITH DIFFERENTIAL/PLATELET
Basophils Absolute: 0 10*3/uL (ref 0.0–0.1)
Eosinophils Absolute: 0 10*3/uL (ref 0.0–0.7)
Eosinophils Relative: 0 % (ref 0–5)
Lymphocytes Relative: 5 % — ABNORMAL LOW (ref 12–46)
MCV: 90 fL (ref 78.0–100.0)
Platelets: 504 10*3/uL — ABNORMAL HIGH (ref 150–400)
RDW: 15.5 % (ref 11.5–15.5)
WBC: 25 10*3/uL — ABNORMAL HIGH (ref 4.0–10.5)

## 2012-04-05 LAB — POCT I-STAT 3, VENOUS BLOOD GAS (G3P V)
Acid-base deficit: 7 mmol/L — ABNORMAL HIGH (ref 0.0–2.0)
O2 Saturation: 94 %
TCO2: 20 mmol/L (ref 0–100)
pO2, Ven: 74 mmHg — ABNORMAL HIGH (ref 30.0–45.0)

## 2012-04-05 LAB — URINALYSIS, ROUTINE W REFLEX MICROSCOPIC
Glucose, UA: NEGATIVE mg/dL
Specific Gravity, Urine: 1.019 (ref 1.005–1.030)
pH: 5 (ref 5.0–8.0)

## 2012-04-05 LAB — COMPREHENSIVE METABOLIC PANEL
ALT: 49 U/L (ref 0–53)
AST: 118 U/L — ABNORMAL HIGH (ref 0–37)
CO2: 19 mEq/L (ref 19–32)
Calcium: 8.1 mg/dL — ABNORMAL LOW (ref 8.4–10.5)
Sodium: 138 mEq/L (ref 135–145)
Total Protein: 6.3 g/dL (ref 6.0–8.3)

## 2012-04-05 LAB — GLUCOSE, CAPILLARY
Glucose-Capillary: 363 mg/dL — ABNORMAL HIGH (ref 70–99)
Glucose-Capillary: 398 mg/dL — ABNORMAL HIGH (ref 70–99)

## 2012-04-05 LAB — APTT: aPTT: 33 seconds (ref 24–37)

## 2012-04-05 LAB — URINE MICROSCOPIC-ADD ON

## 2012-04-05 LAB — POCT I-STAT TROPONIN I

## 2012-04-05 LAB — OCCULT BLOOD, POC DEVICE: Fecal Occult Bld: POSITIVE — AB

## 2012-04-05 LAB — RAPID URINE DRUG SCREEN, HOSP PERFORMED
Amphetamines: NOT DETECTED
Benzodiazepines: NOT DETECTED
Opiates: NOT DETECTED

## 2012-04-05 MED ORDER — ATORVASTATIN CALCIUM 40 MG PO TABS
40.0000 mg | ORAL_TABLET | Freq: Every day | ORAL | Status: DC
  Administered 2012-04-06 – 2012-04-14 (×9): 40 mg via ORAL
  Filled 2012-04-05 (×10): qty 1

## 2012-04-05 MED ORDER — INSULIN ASPART 100 UNIT/ML ~~LOC~~ SOLN
0.0000 [IU] | Freq: Three times a day (TID) | SUBCUTANEOUS | Status: DC
  Administered 2012-04-06: 5 [IU] via SUBCUTANEOUS
  Administered 2012-04-06: 3 [IU] via SUBCUTANEOUS
  Administered 2012-04-06: 2 [IU] via SUBCUTANEOUS
  Administered 2012-04-07: 1 [IU] via SUBCUTANEOUS
  Administered 2012-04-08 – 2012-04-09 (×3): 2 [IU] via SUBCUTANEOUS
  Administered 2012-04-09: 1 [IU] via SUBCUTANEOUS
  Administered 2012-04-09 – 2012-04-10 (×2): 2 [IU] via SUBCUTANEOUS
  Administered 2012-04-10: 5 [IU] via SUBCUTANEOUS
  Administered 2012-04-10 – 2012-04-11 (×2): 3 [IU] via SUBCUTANEOUS
  Administered 2012-04-11 – 2012-04-12 (×2): 2 [IU] via SUBCUTANEOUS
  Administered 2012-04-12: 1 [IU] via SUBCUTANEOUS
  Administered 2012-04-12 – 2012-04-13 (×2): 2 [IU] via SUBCUTANEOUS
  Administered 2012-04-14: 7 [IU] via SUBCUTANEOUS

## 2012-04-05 MED ORDER — ACETAMINOPHEN 650 MG RE SUPP: 650.0000 mg | Freq: Four times a day (QID) | RECTAL | Status: DC | PRN

## 2012-04-05 MED ORDER — BIOTENE DRY MOUTH MT LIQD: 15.0000 mL | OROMUCOSAL | Status: DC | PRN

## 2012-04-05 MED ORDER — SODIUM CHLORIDE 0.9 % IV SOLN
80.0000 mg | Freq: Two times a day (BID) | INTRAVENOUS | Status: DC
  Administered 2012-04-05 – 2012-04-06 (×2): 80 mg via INTRAVENOUS
  Filled 2012-04-05 (×4): qty 80

## 2012-04-05 MED ORDER — HYDRALAZINE HCL 20 MG/ML IJ SOLN
10.0000 mg | Freq: Four times a day (QID) | INTRAMUSCULAR | Status: DC | PRN
  Administered 2012-04-18 – 2012-04-19 (×2): 10 mg via INTRAVENOUS
  Filled 2012-04-05: qty 0.5
  Filled 2012-04-05: qty 1
  Filled 2012-04-05 (×2): qty 0.5

## 2012-04-05 MED ORDER — ACETAMINOPHEN 325 MG PO TABS: 650.0000 mg | ORAL_TABLET | Freq: Four times a day (QID) | ORAL | Status: DC | PRN

## 2012-04-05 MED ORDER — SODIUM CHLORIDE 0.9 % IV SOLN
INTRAVENOUS | Status: DC
  Filled 2012-04-05: qty 1

## 2012-04-05 MED ORDER — DEXTROSE 5 % IV SOLN
2.0000 g | INTRAVENOUS | Status: DC
  Administered 2012-04-05: 2 g via INTRAVENOUS
  Filled 2012-04-05 (×2): qty 2

## 2012-04-05 MED ORDER — ACETAMINOPHEN 325 MG PO TABS: 650.0000 mg | ORAL_TABLET | Freq: Once | ORAL | Status: DC

## 2012-04-05 MED ORDER — QUETIAPINE FUMARATE 400 MG PO TABS
400.0000 mg | ORAL_TABLET | Freq: Every day | ORAL | Status: DC
  Administered 2012-04-06: 200 mg via ORAL
  Administered 2012-04-07 – 2012-04-08 (×2): 400 mg via ORAL
  Filled 2012-04-05 (×5): qty 1

## 2012-04-05 MED ORDER — ONDANSETRON HCL 4 MG PO TABS: 4.0000 mg | ORAL_TABLET | Freq: Four times a day (QID) | ORAL | Status: DC | PRN

## 2012-04-05 MED ORDER — ONDANSETRON HCL 4 MG/2ML IJ SOLN
4.0000 mg | Freq: Four times a day (QID) | INTRAMUSCULAR | Status: DC | PRN
  Filled 2012-04-05: qty 2

## 2012-04-05 MED ORDER — INSULIN GLARGINE 100 UNIT/ML ~~LOC~~ SOLN
10.0000 [IU] | Freq: Every day | SUBCUTANEOUS | Status: DC
  Administered 2012-04-05: 10 [IU] via SUBCUTANEOUS

## 2012-04-05 MED ORDER — SODIUM CHLORIDE 0.9 % IV BOLUS (SEPSIS)
1000.0000 mL | Freq: Once | INTRAVENOUS | Status: AC
  Administered 2012-04-05: 1000 mL via INTRAVENOUS

## 2012-04-05 MED ORDER — INSULIN ASPART 100 UNIT/ML ~~LOC~~ SOLN
0.0000 [IU] | Freq: Every day | SUBCUTANEOUS | Status: DC
  Administered 2012-04-05: 5 [IU] via SUBCUTANEOUS
  Administered 2012-04-08 – 2012-04-09 (×2): 3 [IU] via SUBCUTANEOUS
  Administered 2012-04-10: 4 [IU] via SUBCUTANEOUS
  Administered 2012-04-14: 3 [IU] via SUBCUTANEOUS

## 2012-04-05 MED ORDER — SODIUM CHLORIDE 0.9 % IV SOLN
INTRAVENOUS | Status: AC
  Administered 2012-04-05: 23:00:00 via INTRAVENOUS

## 2012-04-05 NOTE — ED Provider Notes (Signed)
History     CSN: [id number]  Arrival date & time 04/05/12  1228   First MD Initiated Contact with Patient 04/05/12 1243      Chief Complaint  Patient presents with  . Weakness  . Altered Mental Status    HPI [name] is a 72 y.o. male who presents to the ED for concern of AMS.  Patient reportedly alert and oriented and a normal person.  Patient had inflatable penile prosthesis placed 3 weeks ago and since then has been on slow decline per family.  Then yesterday, patient not making as much sense when talking and asking strange questions.  Appeared to be slower and slurring his speech.  Thought nothing of it.  Then today it was worse.  EMS called and patient brought in for further evaluation.  Past Medical History  Diagnosis Date  . Coronary artery disease   . Hypertension   . Depression   . Diabetes mellitus without complication   . Stroke 72 years old  . GERD (gastroesophageal reflux disease)     hx of  . Arthritis     hx of  . Erectile dysfunction   . Atrial fibrillation     Past Surgical History  Procedure Laterality Date  . Coronary artery bypass graft    . Eye surgery      cataracts bilateral  . Carpal tunnel release      bilateral  . Tonsillectomy    . Penile prosthesis implant  03/15/2012    Procedure: PENILE PROTHESIS INFLATABLE;  Surgeon: Lindaann Slough, MD;  Location: [facility];  Service: Urology;  Laterality: N/A;  . Circumcision  03/15/2012    Procedure: CIRCUMCISION ADULT;  Surgeon: Lindaann Slough, MD;  Location: [facility];  Service: Urology;  Laterality: N/A;    No family history on file.  History  Substance Use Topics  . Smoking status: Never Smoker   . Smokeless tobacco: Never Used  . Alcohol Use: No      Review of Systems  Constitutional: Negative for fever and chills.  HENT: Negative for congestion, sore throat and neck pain.   Respiratory: Negative for cough.   Gastrointestinal: Positive for blood in stool (dark stool). Negative for  nausea, vomiting, abdominal pain, diarrhea and constipation.  Endocrine: Negative for polyuria.  Genitourinary: Negative for dysuria and hematuria.  Skin: Negative for rash.  Neurological: Negative for headaches.  Psychiatric/Behavioral: Negative.   All other systems reviewed and are negative.    Allergies  Review of patient's allergies indicates no known allergies.  Home Medications   Current Outpatient Rx  Name  Route  Sig  Dispense  Refill  . amLODipine-benazepril (LOTREL) 10-40 MG per capsule   Oral   Take 1 capsule by mouth daily.         Marland Kitchen aspirin 325 MG EC tablet   Oral   Take 325 mg by mouth daily.         Marland Kitchen atorvastatin (LIPITOR) 40 MG tablet   Oral   Take 40 mg by mouth daily.         . cephALEXin (KEFLEX) 500 MG capsule   Oral   Take 1 capsule (500 mg total) by mouth 4 (four) times daily.   28 capsule   0   . cholecalciferol (VITAMIN D) 1000 UNITS tablet   Oral   Take 1,000 Units by mouth daily.         . clopidogrel (PLAVIX) 75 MG tablet   Oral   Take  75 mg by mouth daily.         Marland Kitchen glimepiride (AMARYL) 4 MG tablet   Oral   Take 4 mg by mouth daily before breakfast.         . hydrochlorothiazide (HYDRODIURIL) 25 MG tablet   Oral   Take 25 mg by mouth daily.         Marland Kitchen HYDROcodone-acetaminophen (VICODIN) 5-500 MG per tablet   Oral   Take 1 tablet by mouth every 6 (six) hours as needed for pain.   30 tablet   0   . insulin aspart protamine-insulin aspart (NOVOLOG 70/30) (70-30) 100 UNIT/ML injection   Subcutaneous   Inject into the skin as directed.         . insulin glargine (LANTUS) 100 UNIT/ML injection   Subcutaneous   Inject into the skin 2 (two) times daily.         . metFORMIN (GLUCOPHAGE) 1000 MG tablet   Oral   Take 1,000 mg by mouth 2 (two) times daily with a meal.         . metoprolol tartrate (LOPRESSOR) 25 MG tablet   Oral   Take 12.5 mg by mouth 2 (two) times daily. SCHEDULED TWICE DAILY BUT PATIENT  TAKES BOTH HALVES AT 730 PM         . nitroGLYCERIN (NITROSTAT) 0.4 MG SL tablet   Sublingual   Place 1 tablet (0.4 mg total) under the tongue every 5 (five) minutes as needed for chest pain.   40 tablet   12   . QUEtiapine (SEROQUEL) 400 MG tablet   Oral   Take 400 mg by mouth at bedtime.           BP 129/42  Pulse 81  Temp(Src) 97.4 F (36.3 C) (Oral)  Resp 16  SpO2 100%  Physical Exam  Nursing note and vitals reviewed. Constitutional: He is oriented to person, place, and time. He appears well-developed and well-nourished. No distress.  HENT:  Head: Normocephalic and atraumatic.  Right Ear: External ear normal.  Left Ear: External ear normal.  Mouth/Throat: Oropharynx is clear and moist. No oropharyngeal exudate.  Eyes: Conjunctivae are normal. Pupils are equal, round, and reactive to light. Right eye exhibits no discharge.  Neck: Normal range of motion. Neck supple. No tracheal deviation present.  Cardiovascular: Normal rate, regular rhythm and intact distal pulses.   Pulmonary/Chest: Effort normal. No respiratory distress. He has no wheezes. He has no rales.  Abdominal: Soft. He exhibits no distension. There is no tenderness. There is no rebound and no guarding.  Musculoskeletal: Normal range of motion.  Neurological: He is alert and oriented to person, place, and time.  Skin: Skin is warm and dry. No rash noted. He is not diaphoretic.  Psychiatric: He has a normal mood and affect.    ED Course  Procedures (including critical care time)  Labs Reviewed  CBC WITH DIFFERENTIAL - Abnormal; Notable for the following:    WBC 25.0 (*)    RBC 1.40 (*)    Hemoglobin 4.1 (*)    HCT 12.6 (*)    Platelets 504 (*)    Neutrophils Relative 92 (*)    Neutro Abs 22.9 (*)    Lymphocytes Relative 5 (*)    All other components within normal limits  COMPREHENSIVE METABOLIC PANEL - Abnormal; Notable for the following:    Glucose, Bld 421 (*)    BUN 131 (*)    Creatinine,  Ser 2.41 (*)  Calcium 8.1 (*)    Albumin 2.2 (*)    AST 118 (*)    Total Bilirubin 0.2 (*)    GFR calc non Af Amer 26 (*)    GFR calc Af Amer 30 (*)    All other components within normal limits  LACTIC ACID, PLASMA - Abnormal; Notable for the following:    Lactic Acid, Venous 3.2 (*)    All other components within normal limits  GLUCOSE, CAPILLARY - Abnormal; Notable for the following:    Glucose-Capillary 363 (*)    All other components within normal limits  POCT I-STAT 3, BLOOD GAS (G3P V) - Abnormal; Notable for the following:    pH, Ven 7.344 (*)    pCO2, Ven 34.1 (*)    pO2, Ven 74.0 (*)    Bicarbonate 18.6 (*)    Acid-base deficit 7.0 (*)    All other components within normal limits  OCCULT BLOOD, POC DEVICE - Abnormal; Notable for the following:    Fecal Occult Bld POSITIVE (*)    All other components within normal limits  GRAM STAIN  URINE CULTURE  ETHANOL  URINALYSIS, ROUTINE W REFLEX MICROSCOPIC  URINE RAPID DRUG SCREEN (HOSP PERFORMED)  BLOOD GAS, VENOUS  POCT I-STAT TROPONIN I  PREPARE RBC (CROSSMATCH)  TYPE AND SCREEN   Dg Chest 2 View  04/05/2012  *RADIOLOGY REPORT*  Clinical Data: Altered mental status.  CHEST - 2 VIEW  Comparison: 03/03/2012.  Findings: The cardiac silhouette, mediastinal and hilar contours are stable.  Stable surgical changes from bypass surgery.  No acute pulmonary findings.  No pleural effusion or pneumothorax.  The bony thorax is intact.  IMPRESSION: No acute cardiopulmonary findings.   Original Report Authenticated By: Rudie Meyer, [name]    Ct Head Wo Contrast  04/05/2012  *RADIOLOGY REPORT*  Clinical Data: Weakness and altered mental status.  CT HEAD WITHOUT CONTRAST  Technique:  Contiguous axial images were obtained from the base of the skull through the vertex without contrast.  Comparison: 08/22/2010.  Findings: Stable age related cerebral atrophy, ventriculomegaly and periventricular white matter disease.  No extra-axial fluid  collections.  No CT findings for hemispheric infarction and/or intracranial hemorrhage.  No mass lesions.  The brainstem and cerebellum are normal and stable. There is a remote right cerebellar hemispheric infarction.  The bony structures are intact.  No skull fracture.  Mild mucoperiosteal thickening in the left half of the sphenoid sinus and scattered ethmoid air cells.  The mastoid air cells are clear.  IMPRESSION:  1. Stable age related cerebral atrophy, ventriculomegaly and periventricular white matter disease. 2.  Remote right-sided cerebellar infarction. 3.  No acute intracranial findings or mass lesion.   Original Report Authenticated By: Rudie Meyer, [name]      No diagnosis found.    MDM   CHESTER SIBERT is a 72 y.o. male who presents to the ED with concern of AMS.  On exam, patient oriented but very slow to respond and without clear line of thought.  No TPA indicated as patient without clear stroke symptoms and outside of timeframe.  AMS workup performed and with numerous abnormalities.   1)GI bleed: patient complaining of tarry stools.  Rectal showing black tarry heme positive stools.  Hg 4.1.  No evidence of other bleed.  Patient typed and crossed and transfusion of 2 units RBCs initiated.  GI consulted and will see patient on floor.   2)ARF: patient appearing to be dehydrated on exam.  Likely 2/2 GI bleed.  BUN 131, creatinine 2.4.  NS bolus and 2 units initiated to rehydrate.  Patient with history of recent penile implant and concern for post bladder obstruction.  Bladder scan with 200cc.  Patient rehdyrated. 3)Acidosis: Patient with VBG of 7.34/34/18.  Lactate at 3.2.  Likely 2/2 dehydration from bleed and uremia.   4)Hyperglycemia.  Rehydrated.  No insulin at this time.  DKA unlikely but possible.  Discussed management with hospitalist.  Given extent of picture, hospitalists consulted for admission.  Stepdown required for GI bleed and multiple active problems.  Patient admitted.    Stable for admission to stepdown with normal vitals and protecting airway.  No active BRB from rectum.        Arloa Koh, MD 04/05/12 [phone]

## 2012-04-05 NOTE — ED Notes (Signed)
CBG was 363. Notified Nurse Maxine Glenn.

## 2012-04-05 NOTE — ED Notes (Addendum)
Family reports pt had a procedure 3 weeks ago & has steadily declined in health. Pt not eating, drinking, staying in bed all day, generalized weakness & now becoming slightly confused. Pt denies any pain. Per EMS - pt slow to respond & answer questions.

## 2012-04-05 NOTE — ED Notes (Signed)
Admitting MD at bedside.

## 2012-04-05 NOTE — ED Notes (Signed)
Patient transported to CT 

## 2012-04-05 NOTE — ED Notes (Signed)
S/p penile implant 03-15-12, ever since then has not been feeling well. Decreased appetite, not eating & drinking, not taking meds as prescribed, c/o generalized weakness, intermittent episodes of dizziness. C/o dysuria & pain at procedure site. Has not had post op f/u visit with surgeon.  Denies abd pain, CP, fever, chills, n/v/d, SOB.

## 2012-04-05 NOTE — ED Notes (Signed)
The urine was not collected on the patient.

## 2012-04-05 NOTE — H&P (Signed)
[facility] History and Physical  [name] ZOX:[id number] DOB: 03-Aug-1940 DOA: 04/05/2012  Referring physician: Dr. Bebe Shaggy  PCP: [name], MD   Chief Complaint: Weakness, increased fatigue, AMS  HPI: [name] is a 72 y.o. male with a medical history significant for coronary artery disease, hypertension, depression, diabetes, GERD, atrial fibrillation and recent admission for penile prosthesis implant; came to the hospital due to increased weakness and change in mental status according to family members. They report that since his prosthesis implantation he has been on a slow but steadily declining state will. The reports that one day prior to admission the patient was not making much of a sentence when he was talking and he was asking a strange questions, he appears to be a slower and also with slurring speech according to family member. Patient reports decreased appetite, and also complaining of chills. Denies chest pain, shortness of breath, abdominal pain, nausea/vomiting, headaches or any other acute complaints. After discussing with patient our concerns he endorses seen some black stools. In the ED patient was found without acute on chronic renal failure creatinine up to 2.4, elevated wbc's of 25,000, positive fecal occult blood test, elevated lactic acid and a hemoglobin of 4.1 (normal about 3 weeks ago). Patient received 1.5 bolus of fluids, was type and cross and 2 units of blood has been order at this moment; GI was consulted and [facility] has been called to the patient for further evaluation and treatment.  Review of Systems:  Negative except as otherwise mentioned on history of present illness.  Past Medical History  Diagnosis Date  . Coronary artery disease   . Hypertension   . Depression   . Diabetes mellitus without complication   . Stroke 72 years old  . GERD (gastroesophageal reflux disease)     hx of  . Arthritis     hx of  . Erectile  dysfunction   . Atrial fibrillation    Past Surgical History  Procedure Laterality Date  . Coronary artery bypass graft    . Eye surgery      cataracts bilateral  . Carpal tunnel release      bilateral  . Tonsillectomy    . Penile prosthesis implant  03/15/2012    Procedure: PENILE PROTHESIS INFLATABLE;  Surgeon: Lindaann Slough, MD;  Location: [facility];  Service: Urology;  Laterality: N/A;  . Circumcision  03/15/2012    Procedure: CIRCUMCISION ADULT;  Surgeon: Lindaann Slough, MD;  Location: [facility];  Service: Urology;  Laterality: N/A;   Social History:  reports that he has never smoked. He has never used smokeless tobacco. He reports that he does not drink alcohol or use illicit drugs. lives at home by himself and reports no need of assistance with activities of daily living.   No Known Allergies  Family history: Significant for hypertension and diabetes  Prior to Admission medications   Medication Sig Start Date End Date Taking? Authorizing Provider  amLODipine-benazepril (LOTREL) 10-40 MG per capsule Take 1 capsule by mouth daily.    Historical Provider, MD  aspirin 325 MG EC tablet Take 325 mg by mouth daily.    Historical Provider, MD  atorvastatin (LIPITOR) 40 MG tablet Take 40 mg by mouth daily.    Historical Provider, MD  cephALEXin (KEFLEX) 500 MG capsule Take 1 capsule (500 mg total) by mouth 4 (four) times daily. 03/16/12   Lindaann Slough, MD  cholecalciferol (VITAMIN D) 1000 UNITS tablet Take 1,000 Units by mouth daily.  Historical Provider, MD  clopidogrel (PLAVIX) 75 MG tablet Take 75 mg by mouth daily.    Historical Provider, MD  glimepiride (AMARYL) 4 MG tablet Take 4 mg by mouth daily before breakfast.    Historical Provider, MD  hydrochlorothiazide (HYDRODIURIL) 25 MG tablet Take 25 mg by mouth daily.    Historical Provider, MD  HYDROcodone-acetaminophen (VICODIN) 5-500 MG per tablet Take 1 tablet by mouth every 6 (six) hours as needed for pain. 03/16/12   Marc-Henry  Nesi, MD  insulin aspart protamine-insulin aspart (NOVOLOG 70/30) (70-30) 100 UNIT/ML injection Inject into the skin as directed.    Historical Provider, MD  insulin glargine (LANTUS) 100 UNIT/ML injection Inject into the skin 2 (two) times daily.    Historical Provider, MD  metFORMIN (GLUCOPHAGE) 1000 MG tablet Take 1,000 mg by mouth 2 (two) times daily with a meal.    Historical Provider, MD  metoprolol tartrate (LOPRESSOR) 25 MG tablet Take 12.5 mg by mouth 2 (two) times daily. SCHEDULED TWICE DAILY BUT PATIENT TAKES BOTH HALVES AT 730 PM    Historical Provider, MD  nitroGLYCERIN (NITROSTAT) 0.4 MG SL tablet Place 1 tablet (0.4 mg total) under the tongue every 5 (five) minutes as needed for chest pain. 03/16/12   Marc-Henry Nesi, MD  QUEtiapine (SEROQUEL) 400 MG tablet Take 400 mg by mouth at bedtime.    Historical Provider, MD   Physical Exam: Filed Vitals:   04/05/12 1530 04/05/12 1600 04/05/12 1630 04/05/12 1730  BP: 118/43 131/35 118/36 118/45  Pulse: 81     Temp:      TempSrc:      Resp: 16 15 20 17   SpO2: 100% 99% 99% 98%     General:  Alert, awake and oriented x3, complaining of severe fatigue/weakness; apparently to examination, dry mucous membranes.  Eyes: PERRLA, extraocular muscles intact, no icterus  ENT: Dry mucous membranes, no erythema, no exudate, third dentition, no drainage out of his ears or nostrils  Neck: supple, no JVD  Cardiovascular: regular rate, no rubs, no gallops, and intact pulses  Respiratory: Clear to auscultation bilaterally  Abdomen: Soft, nontender, no guarding, positive bowel sounds  Skin: No rash, no petechiae, patient was no diaphoretic  Musculoskeletal: No joint swelling, normal range of motion, no joint erythema  Psychiatric: Appropriate mood  Neurologic: Cranial nerve grossly intact, able to follow 3 steps commands, muscle strength 4/5 bilaterally symmetrically secondary to poor effort, no focal motor or sensory deficit.  Labs on  Admission:  Basic Metabolic Panel:  Recent Labs Lab 04/05/12 1244  NA 138  K 4.1  CL 101  CO2 19  GLUCOSE 421*  BUN 131*  CREATININE 2.41*  CALCIUM 8.1*   Liver Function Tests:  Recent Labs Lab 04/05/12 1244  AST 118*  ALT 49  ALKPHOS 71  BILITOT 0.2*  PROT 6.3  ALBUMIN 2.2*   CBC:  Recent Labs Lab 04/05/12 1244  WBC 25.0*  NEUTROABS 22.9*  HGB 4.1*  HCT 12.6*  MCV 90.0  PLT 504*   CBG:  Recent Labs Lab 04/05/12 1253  GLUCAP 363*    Radiological Exams on Admission: Dg Chest 2 View  04/05/2012  *RADIOLOGY REPORT*  Clinical Data: Altered mental status.  CHEST - 2 VIEW  Comparison: 03/03/2012.  Findings: The cardiac silhouette, mediastinal and hilar contours are stable.  Stable surgical changes from bypass surgery.  No acute pulmonary findings.  No pleural effusion or pneumothorax.  The bony thorax is intact.  IMPRESSION: No acute cardiopulmonary findings.  Original Report Authenticated By: Rudie Meyer, [name]    Ct Head Wo Contrast  04/05/2012  *RADIOLOGY REPORT*  Clinical Data: Weakness and altered mental status.  CT HEAD WITHOUT CONTRAST  Technique:  Contiguous axial images were obtained from the base of the skull through the vertex without contrast.  Comparison: 08/22/2010.  Findings: Stable age related cerebral atrophy, ventriculomegaly and periventricular white matter disease.  No extra-axial fluid collections.  No CT findings for hemispheric infarction and/or intracranial hemorrhage.  No mass lesions.  The brainstem and cerebellum are normal and stable. There is a remote right cerebellar hemispheric infarction.  The bony structures are intact.  No skull fracture.  Mild mucoperiosteal thickening in the left half of the sphenoid sinus and scattered ethmoid air cells.  The mastoid air cells are clear.  IMPRESSION:  1. Stable age related cerebral atrophy, ventriculomegaly and periventricular white matter disease. 2.  Remote right-sided cerebellar infarction. 3.  No  acute intracranial findings or mass lesion.   Original Report Authenticated By: Rudie Meyer, [name]     EKG: No acute ischemic changes appreciated.  Assessment/Plan 1-anemia secondary to GI bleed: Patient with black stools and positive fecal occult blood test suggesting upper GI bleed. Hemoglobin 4.1 in the emergency department last hemoglobin is from 3 weeks ago (03/03/2012) and he was 12.2 -Admitted to step down -2 large bores IV in place -Fluid resuscitation and 2 units of red blood cells to be transfused -Clear liquid diet -IV Protonix -Cycle H./H. to follow hemoglobin. -Will discontinue antihypertensive drugs and also aspirin   2-lactic acidosis: Secondary to decreased perfusion with ongoing bleeding. -Will provide fluid resuscitation and follow lactic acid level -There is also concerns for UTI due to recent urinary tract manipulation with penile prosthesis implantation. -Will follow urine cx's -Start empiric rocephin  3-Atrial fibrillation: Right stable. At this moment due to concerns for ongoing acute bleeding, will hold any antihypertensive drugs. Also no anticoagulation will be given.  4-Acute on chronic renal failure (stage II at baseline creatinine 1.27): Acute renal failure secondary to most likely UTI, decreased perfusion with ongoing anemia, continue use of diuretics and also Benicar. -Will check renal ultrasound -Empiric Rocephin -Follow urine culture -IVF resuscitation -Follow renal function  5-Anemia: Most likely secondary to GI bleed. Treatment and workup as mentioned above.  6-Leukocytosis: The margination versus infection (concern for UTI). -Follow UA/urine culture -Empiric Rocephin -CBC in the morning to follow WBCs  7-H/O: CVA (cerebrovascular accident): No acute deficit or neurologic abnormalities. CT of the head in the ED demonstrated all cerebellar infarct without any other acute intracranial defects. Aspirin will be held secondary to GI bleed. Plan is to  resume once bleeding stop  8-Depression: Continue Seroquel.  9-Diabetes: Patient CBGs [name] 360 on the moment of admission to the hospital, infection most likely contributed to high blood sugar. Patient reports that he is not the best compliant with his medications,  especially insulin. Will check hemoglobin A1c -Sliding scale insulin and Lantus -CBGs before every meal C./each bedtime.  10-HTN (hypertension): Currently stable. Will hold antihypertensive in the setting of acute bleeding. -Will use when necessary hydralazine as needed for systolic blood pressure at both 180 or diastolic blood pressure above 098.  11-abdomen to status: Most likely secondary to encephalopathy (presumed do to infection and hypoperfusion). -Patient received fluid resuscitation -Blood transfusion -Empiric antibiotics and will follow his urine culture.  DVT: SCDs.   *GI has been consulted by emergency department.  Code Status: DO NOT RESUSCITATE Family Communication: Sister at  bedside Disposition Plan: Admit to inpatient to step down secondary to GI bleed, lactic acidosis, acute on chronic renal failure and leukocytosis; LOS > 2 midnights  Time spent: >30 minutes  Yarethzi Branan [facility] Pager [phone]  If 7PM-7AM, please contact night-coverage [email or web address] Password [facility] 04/05/2012, 5:42 PM

## 2012-04-05 NOTE — ED Notes (Signed)
Patient transported to X-ray 

## 2012-04-06 ENCOUNTER — Encounter (HOSPITAL_COMMUNITY): Payer: Self-pay | Admitting: Gastroenterology

## 2012-04-06 ENCOUNTER — Encounter (HOSPITAL_COMMUNITY)
Admission: EM | Disposition: A | Discharge status: Home Health | Payer: Self-pay | Source: Home / Self Care | Attending: Internal Medicine

## 2012-04-06 ENCOUNTER — Inpatient Hospital Stay (HOSPITAL_COMMUNITY): Payer: Medicare Other

## 2012-04-06 DIAGNOSIS — K922 Gastrointestinal hemorrhage, unspecified: Secondary | ICD-10-CM | POA: Diagnosis present

## 2012-04-06 DIAGNOSIS — D62 Acute posthemorrhagic anemia: Secondary | ICD-10-CM | POA: Diagnosis present

## 2012-04-06 HISTORY — PX: ESOPHAGOGASTRODUODENOSCOPY: SHX5428

## 2012-04-06 LAB — CBC
HCT: 17.3 % — ABNORMAL LOW (ref 39.0–52.0)
HCT: 20.8 % — ABNORMAL LOW (ref 39.0–52.0)
MCH: 29.7 pg (ref 26.0–34.0)
MCH: 30 pg (ref 26.0–34.0)
MCHC: 34.1 g/dL (ref 30.0–36.0)
MCHC: 34.7 g/dL (ref 30.0–36.0)
MCHC: 34.9 g/dL (ref 30.0–36.0)
MCV: 86.7 fL (ref 78.0–100.0)
Platelets: 336 10*3/uL (ref 150–400)
RDW: 14.9 % (ref 11.5–15.5)
RDW: 15.6 % — ABNORMAL HIGH (ref 11.5–15.5)
RDW: 15.7 % — ABNORMAL HIGH (ref 11.5–15.5)

## 2012-04-06 LAB — BASIC METABOLIC PANEL
BUN: 98 mg/dL — ABNORMAL HIGH (ref 6–23)
CO2: 21 mEq/L (ref 19–32)
Calcium: 7.6 mg/dL — ABNORMAL LOW (ref 8.4–10.5)
GFR calc non Af Amer: 36 mL/min — ABNORMAL LOW (ref >90)
Glucose, Bld: 242 mg/dL — ABNORMAL HIGH (ref 70–99)
Potassium: 3.4 mEq/L — ABNORMAL LOW (ref 3.5–5.1)

## 2012-04-06 LAB — GLUCOSE, CAPILLARY
Glucose-Capillary: 243 mg/dL — ABNORMAL HIGH (ref 70–99)
Glucose-Capillary: 160 mg/dL — ABNORMAL HIGH (ref 70–99)

## 2012-04-06 SURGERY — EGD (ESOPHAGOGASTRODUODENOSCOPY)
Anesthesia: Moderate Sedation

## 2012-04-06 MED ORDER — DEXTROSE 5 % IV SOLN
1.0000 g | INTRAVENOUS | Status: DC
  Administered 2012-04-06: 1 g via INTRAVENOUS
  Filled 2012-04-06 (×2): qty 10

## 2012-04-06 MED ORDER — MIDAZOLAM HCL 5 MG/ML IJ SOLN
INTRAMUSCULAR | Status: AC
  Filled 2012-04-06: qty 2

## 2012-04-06 MED ORDER — PANTOPRAZOLE SODIUM 40 MG IV SOLR
40.0000 mg | Freq: Two times a day (BID) | INTRAVENOUS | Status: DC
  Administered 2012-04-06 – 2012-04-08 (×4): 40 mg via INTRAVENOUS
  Filled 2012-04-06 (×5): qty 40

## 2012-04-06 MED ORDER — FENTANYL CITRATE 0.05 MG/ML IJ SOLN
INTRAMUSCULAR | Status: AC
  Filled 2012-04-06: qty 2

## 2012-04-06 MED ORDER — BUTAMBEN-TETRACAINE-BENZOCAINE 2-2-14 % EX AERO
INHALATION_SPRAY | CUTANEOUS | Status: DC | PRN
  Administered 2012-04-06: 2 via TOPICAL

## 2012-04-06 MED ORDER — INSULIN GLARGINE 100 UNIT/ML ~~LOC~~ SOLN
15.0000 [IU] | Freq: Every day | SUBCUTANEOUS | Status: DC
  Administered 2012-04-06 – 2012-04-10 (×5): 15 [IU] via SUBCUTANEOUS

## 2012-04-06 MED ORDER — SODIUM CHLORIDE 0.9 % IV SOLN
INTRAVENOUS | Status: DC
  Administered 2012-04-06: 500 mL via INTRAVENOUS

## 2012-04-06 MED ORDER — FENTANYL CITRATE 0.05 MG/ML IJ SOLN
INTRAMUSCULAR | Status: DC | PRN
  Administered 2012-04-06 (×3): 25 ug via INTRAVENOUS

## 2012-04-06 MED ORDER — DIPHENHYDRAMINE HCL 50 MG/ML IJ SOLN
INTRAMUSCULAR | Status: AC
  Filled 2012-04-06: qty 1

## 2012-04-06 MED ORDER — MIDAZOLAM HCL 10 MG/2ML IJ SOLN
INTRAMUSCULAR | Status: DC | PRN
  Administered 2012-04-06 (×3): 2 mg via INTRAVENOUS

## 2012-04-06 MED ORDER — POTASSIUM CHLORIDE CRYS ER 20 MEQ PO TBCR
20.0000 meq | EXTENDED_RELEASE_TABLET | Freq: Once | ORAL | Status: AC
  Administered 2012-04-06: 20 meq via ORAL
  Filled 2012-04-06: qty 1

## 2012-04-06 MED ORDER — SODIUM CHLORIDE 0.9 % IV SOLN
INTRAVENOUS | Status: DC
  Administered 2012-04-06 (×2): via INTRAVENOUS

## 2012-04-06 NOTE — Progress Notes (Signed)
Inpatient Diabetes Program Recommendations  AACE/ADA: New Consensus Statement on Inpatient Glycemic Control (2013)  Target Ranges:  Prepandial:   less than 140 mg/dL      Peak postprandial:   less than 180 mg/dL (1-2 hours)      Critically ill patients:  140 - 180 mg/dL   Reason for Visit: Results for [name], [name] (MRN [id number]) as of 04/06/2012 16:43  Ref. Range 04/05/2012 12:53 04/05/2012 22:47 04/06/2012 07:54 04/06/2012 11:39 04/06/2012 12:18  Glucose-Capillary Latest Range: 70-99 mg/dL 213 (H) 086 (H) 578 (H) 251 (H) 243 (H)   Spoke to patient regarding home diabetes regimen.  He states that he has recently stopped seeing Dr. Juleen China and is now seeing Dr. Julio Sicks.  He states that he takes 70/30 on a sliding scale and Lantus 30 units bid at home.  Please check A1C to determine pre-hospitalization glycemic control.  Likely will need increased titration of Lantus based on home dose.  Will follow.

## 2012-04-06 NOTE — Progress Notes (Signed)
Utilization Review Completed. 04/06/2012

## 2012-04-06 NOTE — Progress Notes (Signed)
INITIAL NUTRITION ASSESSMENT  DOCUMENTATION CODES Per approved criteria  -Not applicable   INTERVENTION: 1. Pt would benefit from oral nutrition supplement as diet allows, recommend Glucerna Shake po BID, each supplement provides 220 kcal and 10 grams of protein. 2. RD to continue to follow nutrition care plan  NUTRITION DIAGNOSIS: Inadequate oral intake related to inability to eat as evidenced by NPO status.   Goal: Pt to meet >/= 90% of their estimated nutrition needs.  Monitor:  weight trends, lab trends, I/O's, PO intake, supplement tolerance  Reason for Assessment: Malnutrition Screening  72 y.o. male  Admitting Dx:   ASSESSMENT: Recently admitted for penile prosthesis implant. Since his implantation, he has been "slowly but steadily declining" per family. Decreased appetite, chills, and melena. Positive FOBT. Seen by GI, currently at endoscopy to evaluate the upper GI tract.  Pt with significant wt loss since his January admission; pt has lost approximately 11% x 1 month (20 lbs.) Unable to assess if this is an accurate weight, as unable to speak with or re-weigh pt at this time. Also note that if this is true weight loss, pt is at risk for malnutrition given significance. Unable to dx with malnutrition at this time.  Currently NPO.  Height: Ht Readings from Last 1 Encounters:  04/05/12 5\' 5"  (1.651 m)    Weight: Wt Readings from Last 1 Encounters:  04/05/12 163 lb 2.3 oz (74 kg)    Ideal Body Weight: 136 lb  % Ideal Body Weight: 120%  Wt Readings from Last 10 Encounters:  04/05/12 163 lb 2.3 oz (74 kg)  04/05/12 163 lb 2.3 oz (74 kg)  03/15/12 183 lb 6.8 oz (83.2 kg)  03/15/12 183 lb 6.8 oz (83.2 kg)  03/03/12 183 lb 6.4 oz (83.19 kg)    Usual Body Weight: 183 lb  % Usual Body Weight: 89%  BMI:  Body mass index is 27.15 kg/(m^2). Overweight.  Estimated Nutritional Needs: Kcal: 1700 - 1850 kcal Protein: 74 - 85 grams Fluid: 1.8 - 2 liters  daily  Skin: intact  Diet Order: NPO  EDUCATION NEEDS: -No education needs identified at this time   Intake/Output Summary (Last 24 hours) at 04/06/12 1330 Last data filed at 04/06/12 1100  Gross per 24 hour  Intake 3652.58 ml  Output   2000 ml  Net 1652.58 ml    Last BM: 2/18  Labs:   Recent Labs Lab 04/05/12 1244 04/05/12 2352 04/06/12 0837  NA 138  --  142  K 4.1  --  3.4*  CL 101  --  109  CO2 19  --  21  BUN 131*  --  98*  CREATININE 2.41*  --  1.80*  CALCIUM 8.1*  --  7.6*  MG  --  3.6*  --   PHOS  --  5.5*  --   GLUCOSE 421*  --  242*    CBG (last 3)   Recent Labs  04/06/12 0754 04/06/12 1139 04/06/12 1218  GLUCAP 225* 251* 243*    Scheduled Meds: . atorvastatin  40 mg Oral Daily  . cefTRIAXone (ROCEPHIN)  IV  2 g Intravenous Q24H  . insulin aspart  0-5 Units Subcutaneous QHS  . insulin aspart  0-9 Units Subcutaneous TID WC  . insulin glargine  10 Units Subcutaneous QHS  . pantoprazole (PROTONIX) IV  80 mg Intravenous Q12H  . QUEtiapine  400 mg Oral QHS    Continuous Infusions: . sodium chloride 500 mL (04/06/12 1236)  Past Medical History  Diagnosis Date  . Coronary artery disease   . Hypertension   . Depression   . Diabetes mellitus without complication   . Stroke 72 years old  . GERD (gastroesophageal reflux disease)     hx of  . Arthritis     hx of  . Erectile dysfunction   . Atrial fibrillation     Past Surgical History  Procedure Laterality Date  . Coronary artery bypass graft    . Eye surgery      cataracts bilateral  . Carpal tunnel release      bilateral  . Tonsillectomy    . Penile prosthesis implant  03/15/2012    Procedure: PENILE PROTHESIS INFLATABLE;  Surgeon: Lindaann Slough, MD;  Location: [facility];  Service: Urology;  Laterality: N/A;  . Circumcision  03/15/2012    Procedure: CIRCUMCISION ADULT;  Surgeon: Lindaann Slough, MD;  Location: [facility];  Service: Urology;  Laterality: N/A;    Jarold Motto MS,  RD, LDN Pager: [phone] After-hours pager: [phone]

## 2012-04-06 NOTE — Op Note (Signed)
[facility] [address] [place], [address]   ENDOSCOPY PROCEDURE REPORT  PATIENT: [name], [name]  MR#: [id number] BIRTHDATE: 1940-04-17 , 71  yrs. old GENDER: Male ENDOSCOPIST: Wandalee Ferdinand, MD REFERRED BY: PROCEDURE DATE:  04/06/2012 PROCEDURE:   EGD ASA CLASS: 3 INDICATIONS: melena, anemia MEDICATIONS: fentanyl 75 mcg IV, Versed 6 mg IV TOPICAL ANESTHETIC:  DESCRIPTION OF PROCEDURE:   After the risks benefits and alternatives of the procedure were thoroughly explained, informed consent was obtained.  The Pentax Gastroscope [id number]  endoscope was introduced through the mouth and advanced to the second portion of the duodenum      , limited by Without limitations.   The instrument was slowly withdrawn as the mucosa was fully examined.      FINDINGS:  Esophagus: Normal  Stomach: Normal  Duodenum: Normal  COMPLICATIONS:none  ENDOSCOPIC IMPRESSION:normal EGD, no signs of upper GI bleeding or stigmata of bleeding   RECOMMENDATIONS:discuss colonoscopy with patient and probably set up in the next couple of days.   REPEAT EXAM:   _______________________________ Rosalie DoctorWandalee Ferdinand, MD 04/06/2012 1:35 PM       PATIENT NAME:  [name], [name] MR#: [id number]

## 2012-04-06 NOTE — Progress Notes (Signed)
[facility] PROGRESS NOTE  [name] ZOX:[id number] DOB: [birth year] DOA: 04/05/2012 PCP: [name], MD  Brief narrative 72 year old male with PMH of CAD, HTN, depression, DM, GERD, atrial fibrillation on aspirin and Plavix, recent admission for penile prosthesis implant admitted to the hospital on 04/05/12 with complaints of generalized weakness, altered mental status, increasing fatigue and black stools. In the ED, hemoglobin 4.1 (was normal 3 weeks ago), creatinine 2.4, white blood cell 25,000. He was admitted for presumed GI bleeding and symptomatic anemia evaluation and management.  Assessment/Plan: 1. Subacute upper GI bleeding: Patient denies abdominal pain, nausea or vomiting. He claims to be having black stools for 2-3 weeks. Continue IV PPI. Discussed with Dr. Idelle Jo see today. 2. Acute blood loss anemia: Status post 2 PRBCs and hemoglobin went from 4.1-6 g/dL. We'll transfuse an additional 2 PRBCs and follow posttransfusion CBCs. 3. Lactic acidosis: Likely secondary to hypoperfusion from GI bleeding and anemia. 4. Atrial fibrillation: With controlled ventricular rate. Antiplatelets held second GI bleeding. 5. Acute on chronic kidney disease: Improved. Hydrate and monitor daily BMPs. 6. Presumed urinary tract infection, recent urinary tract manipulation/penile prosthesis implantation: IV Rocephin pending urine culture results. 7. Leukocytosis: Combination of stress margination and possible UTI. Continue IV Rocephin. 8. History of CVA: CT head without acute findings. No focal deficits at this time. Antiplatelets held secondary to GI bleed. 9. History of depression: Continue Seroquel. 10. Uncontrolled type II DM: Adjust insulins and monitor. 11. Hypertension: controlled. Meds held due to soft BP's in context  12. Altered mental status: Possibly secondary to symptomatic anemia and UTI. Seems to have resolved. Monitor. 13. Incompletely characterized 15 cm left upper  quadrant mass on renal ultrasound: Discussed with radiology, Dr. Sarajane Marek getting CT abdomen and pelvis without contrast to rule out hematoma as cause for ABLA.   Code Status:  DO NOT RESUSCITATE. Family Communication:  Discussed with patient. Disposition Plan:  Continue to monitor in step down unit.   Consultants:   [facility] gastroenterology.  Procedures:   None  Antibiotics:   None   HPI/Subjective:  Denies complaints. No nausea, vomiting or abdominal pain. No further BMs  Objective: Filed Vitals:   04/06/12 1410 04/06/12 1420 04/06/12 1430 04/06/12 1439  BP: 97/54 99/55 110/56 110/56  Pulse:      Temp:      TempSrc:      Resp: 19 19 20 22   Height:      Weight:      SpO2: 98% 97% 98% 98%    Intake/Output Summary (Last 24 hours) at 04/06/12 1442 Last data filed at 04/06/12 1300  Gross per 24 hour  Intake 3952.58 ml  Output   2000 ml  Net 1952.58 ml   Filed Weights   04/05/12 1830  Weight: 74 kg (163 lb 2.3 oz)    Exam:   General exam: Comfortable.  Respiratory system: Clear. No increased work of breathing.  Cardiovascular system: S1 & S2 heard, RRR. No JVD, murmurs, gallops, clicks or pedal edema. telemetry shows atrial fibrillation with controlled ventricular rate.  Gastrointestinal system: Abdomen is nondistended, soft and nontender. Normal bowel sounds heard.  Central nervous system: Alert and oriented. No focal neurological deficits.  Extremities: Symmetric 5 x 5 power.   Data Reviewed: Basic Metabolic Panel:  Recent Labs Lab 04/05/12 1244 04/05/12 2352 04/06/12 0837  NA 138  --  142  K 4.1  --  3.4*  CL 101  --  109  CO2 19  --  21  GLUCOSE 421*  --  242*  BUN 131*  --  98*  CREATININE 2.41*  --  1.80*  CALCIUM 8.1*  --  7.6*  MG  --  3.6*  --   PHOS  --  5.5*  --    Liver Function Tests:  Recent Labs Lab 04/05/12 1244  AST 118*  ALT 49  ALKPHOS 71  BILITOT 0.2*  PROT 6.3  ALBUMIN 2.2*   No results found for  this basename: LIPASE, AMYLASE,  in the last 168 hours No results found for this basename: AMMONIA,  in the last 168 hours CBC:  Recent Labs Lab 04/05/12 1244 04/05/12 2352 04/06/12 0837  WBC 25.0* 20.7* 21.3*  NEUTROABS 22.9*  --   --   HGB 4.1* 7.1* 6.0*  HCT 12.6* 20.8* 17.3*  MCV 90.0 86.7 85.6  PLT 504* 398 408*   Cardiac Enzymes: No results found for this basename: CKTOTAL, CKMB, CKMBINDEX, TROPONINI,  in the last 168 hours BNP (last 3 results) No results found for this basename: PROBNP,  in the last 8760 hours CBG:  Recent Labs Lab 04/05/12 1253 04/05/12 2247 04/06/12 0754 04/06/12 1139 04/06/12 1218  GLUCAP 363* 398* 225* 251* 243*    Recent Results (from the past 240 hour(s))  MRSA PCR SCREENING     Status: None   Collection Time    04/05/12  6:35 PM      Result Value Range Status   MRSA by PCR NEGATIVE  NEGATIVE Final   Comment:            The GeneXpert MRSA Assay (FDA     approved for NASAL specimens     only), is one component of a     comprehensive MRSA colonization     surveillance program. It is not     intended to diagnose MRSA     infection nor to guide or     monitor treatment for     MRSA infections.     Studies: Dg Chest 2 View  04/05/2012  *RADIOLOGY REPORT*  Clinical Data: Altered mental status.  CHEST - 2 VIEW  Comparison: 03/03/2012.  Findings: The cardiac silhouette, mediastinal and hilar contours are stable.  Stable surgical changes from bypass surgery.  No acute pulmonary findings.  No pleural effusion or pneumothorax.  The bony thorax is intact.  IMPRESSION: No acute cardiopulmonary findings.   Original Report Authenticated By: Rudie Meyer, [name]    Ct Head Wo Contrast  04/05/2012  *RADIOLOGY REPORT*  Clinical Data: Weakness and altered mental status.  CT HEAD WITHOUT CONTRAST  Technique:  Contiguous axial images were obtained from the base of the skull through the vertex without contrast.  Comparison: 08/22/2010.  Findings: Stable  age related cerebral atrophy, ventriculomegaly and periventricular white matter disease.  No extra-axial fluid collections.  No CT findings for hemispheric infarction and/or intracranial hemorrhage.  No mass lesions.  The brainstem and cerebellum are normal and stable. There is a remote right cerebellar hemispheric infarction.  The bony structures are intact.  No skull fracture.  Mild mucoperiosteal thickening in the left half of the sphenoid sinus and scattered ethmoid air cells.  The mastoid air cells are clear.  IMPRESSION:  1. Stable age related cerebral atrophy, ventriculomegaly and periventricular white matter disease. 2.  Remote right-sided cerebellar infarction. 3.  No acute intracranial findings or mass lesion.   Original Report Authenticated By: Rudie Meyer, [name]    US Renal  04/05/2012  *RADIOLOGY REPORT*  Clinical Data: Acute renal failure  RENAL/URINARY TRACT ULTRASOUND COMPLETE  Comparison:  03/10/2006 CT  Findings:  Right Kidney:  Measures 10.8 cm.  No hydronephrosis.  1.5 x 1.3 cm interpolar hypoechoic lesion is incompletely characterized.  Left Kidney:  Measures 10.5 cm.  No hydronephrosis or focal abnormality.  Bladder:  Not visualized.  The patient has a Foley catheter in place.  Incidental note is made of a mixed cystic and solid mass in the left upper quadrant.  IMPRESSION: No hydronephrosis.  Incompletely characterized 1.5 cm hypoechoic right renal lesion, favored to reflect a mildly complex cyst.  Incompletely characterized 15 cm left upper quadrant mass.  Contrast enhanced CT or MRI recommended to further evaluate the above findings.   Original Report Authenticated By: Jearld Lesch, [name]      Additional labs:   Scheduled Meds: . atorvastatin  40 mg Oral Daily  . cefTRIAXone (ROCEPHIN)  IV  2 g Intravenous Q24H  . insulin aspart  0-5 Units Subcutaneous QHS  . insulin aspart  0-9 Units Subcutaneous TID WC  . insulin glargine  10 Units Subcutaneous QHS  . pantoprazole  (PROTONIX) IV  80 mg Intravenous Q12H  . QUEtiapine  400 mg Oral QHS   Continuous Infusions: . sodium chloride 500 mL (04/06/12 1236)    Active Problems:   Atrial fibrillation   Acute on chronic renal failure   Anemia   Lactic acidosis   Leukocytosis   H/O: CVA (cerebrovascular accident)   Depression   Diabetes   HTN (hypertension)   Erectile dysfunction    Time spent: 45 minutes    [facility]  [facility] Pager [phone].   If 8PM-8AM, please contact night-coverage at [email or web address], password [facility] 04/06/2012, 2:42 PM  LOS: 1 day

## 2012-04-06 NOTE — Consult Note (Signed)
Subjective:   HPI  The patient is a 72 year old male with multiple medical problems who we are asked to see in regards to melena and anemia. The patient states that he has been passing black stools for several days. He had altered mentation and family brought him to the hospital. He was subsequently admitted. He was found to have an initial hemoglobin and hematocrit of 4.1 and 12.6. He received blood transfusion and today hemoglobin and hematocrit are 7.1 and 20.8. He denies hematemesis. He denies abdominal pain or heartburn. He does take aspirin, and Plavix. He has never had an ulcer to his knowledge.  Review of Systems No chest pain or shortness of breath  Past Medical History  Diagnosis Date  . Coronary artery disease   . Hypertension   . Depression   . Diabetes mellitus without complication   . Stroke 72 years old  . GERD (gastroesophageal reflux disease)     hx of  . Arthritis     hx of  . Erectile dysfunction   . Atrial fibrillation    Past Surgical History  Procedure Laterality Date  . Coronary artery bypass graft    . Eye surgery      cataracts bilateral  . Carpal tunnel release      bilateral  . Tonsillectomy    . Penile prosthesis implant  03/15/2012    Procedure: PENILE PROTHESIS INFLATABLE;  Surgeon: Lindaann Slough, MD;  Location: [facility]S;  Service: Urology;  Laterality: N/A;  . Circumcision  03/15/2012    Procedure: CIRCUMCISION ADULT;  Surgeon: Lindaann Slough, MD;  Location: [facility];  Service: Urology;  Laterality: N/A;   History   Social History  . Marital Status: Divorced    Spouse Name: N/A    Number of Children: N/A  . Years of Education: N/A   Occupational History  . Not on file.   Social History Main Topics  . Smoking status: Never Smoker   . Smokeless tobacco: Never Used  . Alcohol Use: No  . Drug Use: No  . Sexually Active: Not on file   Other Topics Concern  . Not on file   Social History Narrative  . No narrative on file   family history  is not on file. Current facility-administered medications:0.9 %  sodium chloride infusion, , Intravenous, STAT, Arloa Koh, MD, Last Rate: 125 mL/hr at 04/06/12 0800;  acetaminophen (TYLENOL) suppository 650 mg, 650 mg, Rectal, Q6H PRN, Vassie Loll, MD;  acetaminophen (TYLENOL) tablet 650 mg, 650 mg, Oral, Q6H PRN, Vassie Loll, MD;  antiseptic oral rinse (BIOTENE) solution 15 mL, 15 mL, Mouth Rinse, PRN, Vassie Loll, MD atorvastatin (LIPITOR) tablet 40 mg, 40 mg, Oral, Daily, Vassie Loll, MD, 40 mg at 04/06/12 [phone];  cefTRIAXone (ROCEPHIN) 2 g in dextrose 5 % 50 mL IVPB, 2 g, Intravenous, Q24H, Vassie Loll, MD, 2 g at 04/05/12 2347;  hydrALAZINE (APRESOLINE) injection 10 mg, 10 mg, Intravenous, Q6H PRN, Vassie Loll, MD;  insulin aspart (novoLOG) injection 0-5 Units, 0-5 Units, Subcutaneous, QHS, Vassie Loll, MD, 5 Units at 04/05/12 2305 insulin aspart (novoLOG) injection 0-9 Units, 0-9 Units, Subcutaneous, TID WC, Vassie Loll, MD, 3 Units at 04/06/12 [phone];  insulin glargine (LANTUS) injection 10 Units, 10 Units, Subcutaneous, QHS, Vassie Loll, MD, 10 Units at 04/05/12 2305;  ondansetron (ZOFRAN) injection 4 mg, 4 mg, Intravenous, Q6H PRN, Vassie Loll, MD;  ondansetron [facility]) tablet 4 mg, 4 mg, Oral, Q6H PRN, Vassie Loll, MD pantoprazole (PROTONIX) 80 mg in sodium chloride 0.9 %  100 mL IVPB, 80 mg, Intravenous, Q12H, Vassie Loll, MD, 80 mg at 04/05/12 2303;  QUEtiapine (SEROQUEL) tablet 400 mg, 400 mg, Oral, QHS, Vassie Loll, MD No Known Allergies   Objective:     BP 127/50  Pulse 77  Temp(Src) 97.7 F (36.5 C) (Oral)  Resp 16  Ht 5\' 5"  (1.651 m)  Wt 74 kg (163 lb 2.3 oz)  BMI 27.15 kg/m2  SpO2 97%  He is alert and oriented and in no acute distress  Heart regular rhythm  Lungs clear  Abdomen: Bowel sounds normal, soft, nontender  Laboratory No components found with this basename: d1      Assessment:     #1. Melena  #2. Anemia secondary to blood loss       Plan:     Continue PPI therapy  We will proceed with EGD later today to evaluate the upper GI tract. Lab Results  Component Value Date   HGB 6.0* 04/06/2012   HGB 7.1* 04/05/2012   HGB 4.1* 04/05/2012   HCT 17.3* 04/06/2012   HCT 20.8* 04/05/2012   HCT 12.6* 04/05/2012   ALKPHOS 71 04/05/2012   AST 118* 04/05/2012   ALT 49 04/05/2012

## 2012-04-07 ENCOUNTER — Encounter (HOSPITAL_COMMUNITY): Payer: Self-pay | Admitting: Gastroenterology

## 2012-04-07 DIAGNOSIS — R1902 Left upper quadrant abdominal swelling, mass and lump: Secondary | ICD-10-CM

## 2012-04-07 LAB — BASIC METABOLIC PANEL
BUN: 51 mg/dL — ABNORMAL HIGH (ref 6–23)
CO2: 24 mEq/L (ref 19–32)
Chloride: 113 mEq/L — ABNORMAL HIGH (ref 96–112)
Creatinine, Ser: 1.47 mg/dL — ABNORMAL HIGH (ref 0.50–1.35)
GFR calc Af Amer: 54 mL/min — ABNORMAL LOW (ref >90)

## 2012-04-07 LAB — GLUCOSE, CAPILLARY
Glucose-Capillary: 111 mg/dL — ABNORMAL HIGH (ref 70–99)
Glucose-Capillary: 134 mg/dL — ABNORMAL HIGH (ref 70–99)

## 2012-04-07 LAB — CBC
HCT: 21.6 % — ABNORMAL LOW (ref 39.0–52.0)
HCT: 29.7 % — ABNORMAL LOW (ref 39.0–52.0)
HCT: 28.1 % — ABNORMAL LOW (ref 39.0–52.0)
Hemoglobin: 10.3 g/dL — ABNORMAL LOW (ref 13.0–17.0)
MCH: 30.1 pg (ref 26.0–34.0)
MCH: 30.6 pg (ref 26.0–34.0)
MCHC: 34.3 g/dL (ref 30.0–36.0)
MCHC: 34.7 g/dL (ref 30.0–36.0)
MCHC: 35.2 g/dL (ref 30.0–36.0)
MCV: 86.7 fL (ref 78.0–100.0)
MCV: 86.8 fL (ref 78.0–100.0)
MCV: 86.7 fL (ref 78.0–100.0)
RBC: 3.42 MIL/uL — ABNORMAL LOW (ref 4.22–5.81)
RDW: 15.6 % — ABNORMAL HIGH (ref 11.5–15.5)
RDW: 15.2 % (ref 11.5–15.5)

## 2012-04-07 LAB — PREPARE RBC (CROSSMATCH)

## 2012-04-07 MED ORDER — VITAMIN D3 25 MCG (1000 UNIT) PO TABS
5000.0000 [IU] | ORAL_TABLET | Freq: Every day | ORAL | Status: DC
  Administered 2012-04-08 – 2012-04-14 (×7): 5000 [IU] via ORAL
  Filled 2012-04-07 (×8): qty 5

## 2012-04-07 MED ORDER — TAMSULOSIN HCL 0.4 MG PO CAPS
0.4000 mg | ORAL_CAPSULE | Freq: Every day | ORAL | Status: DC
  Administered 2012-04-07 – 2012-04-14 (×8): 0.4 mg via ORAL
  Filled 2012-04-07 (×10): qty 1

## 2012-04-07 MED ORDER — NITROGLYCERIN 0.4 MG SL SUBL: 0.4000 mg | SUBLINGUAL_TABLET | SUBLINGUAL | Status: DC | PRN

## 2012-04-07 MED ORDER — VITAMIN D-3 125 MCG (5000 UT) PO TABS: 5000.0000 [IU] | ORAL_TABLET | Freq: Every day | ORAL | Status: DC

## 2012-04-07 MED ORDER — METOPROLOL TARTRATE 12.5 MG HALF TABLET
12.5000 mg | ORAL_TABLET | Freq: Two times a day (BID) | ORAL | Status: DC
  Administered 2012-04-07 – 2012-04-13 (×13): 12.5 mg via ORAL
  Filled 2012-04-07 (×17): qty 1

## 2012-04-07 MED ORDER — SODIUM CHLORIDE 0.45 % IV SOLN
INTRAVENOUS | Status: DC
  Administered 2012-04-07 (×2): via INTRAVENOUS

## 2012-04-07 NOTE — Progress Notes (Signed)
Patient ID: [name], male   DOB: [birth year], 72 y.o.   MRN: [id number]   Request for consult regarding biopsy of LUQ mass per Dr Dulce Sellar  Pt with LUQ mass Hx CVA; atrial fib On Plavix  Admitted 2 days ago with melena; weakness Off Plavix x 48 hrs now  Dr Bonnielee Haff has reviewed films Mass IS amenable to bx Must be off Plavix 5-7 days  We will plan for bx 2/24 or 2/25 Dr Dulce Sellar aware

## 2012-04-07 NOTE — Progress Notes (Addendum)
[facility] PROGRESS NOTE  [name] JWJ:[id number] DOB: [birth year] DOA: 04/05/2012 PCP: [name], MD  Brief narrative 72 year old male with PMH of CAD, HTN, depression, DM, GERD, atrial fibrillation on aspirin and Plavix, recent admission for penile prosthesis implant admitted to the hospital on 04/05/12 with complaints of generalized weakness, altered mental status, increasing fatigue and black stools. In the ED, hemoglobin 4.1 (was normal 3 weeks ago), creatinine 2.4, white blood cell 25,000. He was admitted for presumed GI bleeding and symptomatic anemia evaluation and management.  Assessment/Plan: 1. Subacute upper GI bleeding: negative EGD on 2/19. No BM since 2/19.[facility] following. 2. Left upper quadrant mass: Initially seen on renal ultrasound and confirmed with non contrasted CT abdomen. Patient does not complain of abdominal pain. Has decreased appetite for last 3 weeks.Does not seem like hematoma.DD-GIST, Lymphoma, sarcoma. Discussed with GI-requesting IR for imaging guided biopsy. Off aspirin and Plavix for 48 hours. 3. Acute blood loss anemia: Status post 4 PRBCs and hemoglobin went from 4.1-7.4 g/dL on 6/21. We'll transfuse an additional 2 PRBCs and follow posttransfusion CBCs. 4. Atrial fibrillation: in sinus rhythm. Antiplatelets held second GI bleeding. Will resume low dose beta blockers 5. Acute on chronic kidney disease: Improved. Hydrate and monitor daily BMPs. 6. Presumed urinary tract infection, recent urinary tract manipulation/penile prosthesis implantation: urine cultures negative. DC Rocephin. 7. Leukocytosis: ? stress margination. No fevers and improved since admission. Follow daily CBCs. 8. History of CVA: CT head without acute findings. No focal deficits at this time. Antiplatelets held secondary to GI bleed. 9. History of depression: Continue Seroquel. 10. Uncontrolled type II DM: Adjust insulins and monitor. Reasonably  controlled. 11. Hypertension: controlled. Resume low dose beta blockers. 12. Altered mental status: Possibly secondary to symptomatic anemia and UTI. Seems to have resolved. Monitor. 13. Recent penile implant and circumcision: No acute findings. Discussed with Dr. Maryla Morrow are self absorbable and okay to use condom catheter. Outpatient followup. 14. Mild hypernatremia and hyperchloremia: Change IV fluids to have saline. Follow BMP in a.m.  15. Lactic acidosis: Likely secondary to hypoperfusion from GI bleeding and anemia. Resolved.    Code Status:  DO NOT RESUSCITATE. Family Communication:  Discussed with patient and with patient's daughter Ms.Sena Slate via phone.. Disposition Plan:  Continue to monitor in step down unit.   Consultants:   [facility] gastroenterology.  Interventional radiology.  Procedures:   EGD on 2/19  Antibiotics:  Rocephin-DC'd on 2/20  HPI/Subjective:  Denies complaints. No nausea, vomiting or abdominal pain. No further BMs  Objective: Filed Vitals:   04/07/12 1045 04/07/12 1100 04/07/12 1200 04/07/12 1230  BP: 123/50 122/49 138/63 130/48  Pulse: 73 74 67 41  Temp: 98.4 F (36.9 C) 98.5 F (36.9 C) 97.8 F (36.6 C) 97.9 F (36.6 C)  TempSrc: Oral Oral Oral Oral  Resp: 26 16 23 23   Height:      Weight:      SpO2:  93% 93%     Intake/Output Summary (Last 24 hours) at 04/07/12 1459 Last data filed at 04/07/12 1200  Gross per 24 hour  Intake 3535.42 ml  Output   2350 ml  Net 1185.42 ml   Filed Weights   04/05/12 1830 04/06/12 2330  Weight: 74 kg (163 lb 2.3 oz) 75.1 kg (165 lb 9.1 oz)    Exam:   General exam: Comfortable.  Respiratory system: Clear. No increased work of breathing.  Cardiovascular system: S1 & S2 heard, RRR. No JVD, murmurs, gallops, clicks or pedal edema. telemetry shows  sinus rhythm in the 70s with occasional bigemini.  Gastrointestinal system: Abdomen is nondistended, soft and nontender. Normal bowel sounds  heard. ? Palpable mass left upper quadrant.  Central nervous system: Alert and oriented. No focal neurological deficits.  Extremities: Symmetric 5 x 5 power.   Data Reviewed: Basic Metabolic Panel:  Recent Labs Lab 04/05/12 1244 04/05/12 2352 04/06/12 0837 04/07/12 0535  NA 138  --  142 146*  K 4.1  --  3.4* 3.5  CL 101  --  109 113*  CO2 19  --  21 24  GLUCOSE 421*  --  242* 113*  BUN 131*  --  98* 51*  CREATININE 2.41*  --  1.80* 1.47*  CALCIUM 8.1*  --  7.6* 7.7*  MG  --  3.6*  --   --   PHOS  --  5.5*  --   --    Liver Function Tests:  Recent Labs Lab 04/05/12 1244  AST 118*  ALT 49  ALKPHOS 71  BILITOT 0.2*  PROT 6.3  ALBUMIN 2.2*   No results found for this basename: LIPASE, AMYLASE,  in the last 168 hours No results found for this basename: AMMONIA,  in the last 168 hours CBC:  Recent Labs Lab 04/05/12 1244 04/05/12 2352 04/06/12 0837 04/06/12 2300 04/07/12 0535 04/07/12 1406  WBC 25.0* 20.7* 21.3* 17.9* 14.8* 16.8*  NEUTROABS 22.9*  --   --   --   --   --   HGB 4.1* 7.1* 6.0* 8.0* 7.4* 10.3*  HCT 12.6* 20.8* 17.3* 22.9* 21.6* 29.7*  MCV 90.0 86.7 85.6 85.8 86.7 86.8  PLT 504* 398 408* 336 360 342   Cardiac Enzymes: No results found for this basename: CKTOTAL, CKMB, CKMBINDEX, TROPONINI,  in the last 168 hours BNP (last 3 results) No results found for this basename: PROBNP,  in the last 8760 hours CBG:  Recent Labs Lab 04/06/12 1218 04/06/12 1637 04/06/12 2208 04/07/12 0736 04/07/12 1238  GLUCAP 243* 160* 160* 107* 134*    Recent Results (from the past 240 hour(s))  MRSA PCR SCREENING     Status: None   Collection Time    04/05/12  6:35 PM      Result Value Range Status   MRSA by PCR NEGATIVE  NEGATIVE Final   Comment:            The GeneXpert MRSA Assay (FDA     approved for NASAL specimens     only), is one component of a     comprehensive MRSA colonization     surveillance program. It is not     intended to diagnose MRSA      infection nor to guide or     monitor treatment for     MRSA infections.     Studies: Ct Abdomen Pelvis Wo Contrast  04/06/2012  *RADIOLOGY REPORT*  Clinical Data: Left upper quadrant mass on ultrasound  CT ABDOMEN AND PELVIS WITHOUT CONTRAST  Technique:  Multidetector CT imaging of the abdomen and pelvis was performed following the standard protocol without intravenous contrast.  Comparison: Ultrasound of the kidneys of 04/05/2012  Findings: The lung bases are clear.  There is moderate cardiomegaly present.  No pericardial effusion is seen.  The liver is unremarkable in the unenhanced state.  Faintly calcified gallstones are noted within the somewhat distended gallbladder and the gallbladder wall is slightly thickened as well and clinical correlation is recommended to exclude cholecystitis.  The pancreas appears somewhat atrophic and  the pancreatic duct is not dilated. The adrenal glands are both slightly nodular most consistent with adrenal adenomas.  Metastatic involvement would be difficult to exclude.  The spleen is normal in size.  There is a large rounded soft tissue mass within the left upper quadrant as noted by ultrasound which appears to be intimately associated with the greater curvature of the stomach.  This mass measures approximately 11.1 x 12.7 x 13.2 cm and is somewhat inhomogeneous in attenuation on this unenhanced study.  The primary considerations are that of a gastrointestinal stromal tumor (G I S T tumor), gastric carcinoma or massive adenopathy as with lymphoma. Biopsy could be performed in view of the location of this mass.  The kidneys are unremarkable in the unenhanced state.  No renal calculi are noted.  No adenopathy is seen.   The abdominal aorta is normal in caliber.  The urinary bladder is unremarkable.  A reservoir for penile prosthesis is noted just anterior to the urinary bladder.  The prostate is slightly prominent.  No abnormality of the colon is seen.  The appendix is  unremarkable.  There are degenerative changes diffusely throughout the facet joints of the lower lumbar spine.  IMPRESSION:  1.  Large somewhat inhomogeneous rounded soft tissue mass in the left upper quadrant appears to be intimately associated with the greater curvature of the stomach and may represent a GIST tumor versus gastric carcinoma  carcinoma  carcinoma or adenopathy as with lymphoma.  Biopsy could be performed. 2.  Faintly calcified gallstones within a somewhat thick-walled gallbladder.  Rule out acute cholecystitis. 3.  Nodular adrenal glands.  Possibly adrenal adenomas but cannot exclude metastatic involvement.   Original Report Authenticated By: Dwyane Dee, [name]    US Renal  04/05/2012  *RADIOLOGY REPORT*  Clinical Data: Acute renal failure  RENAL/URINARY TRACT ULTRASOUND COMPLETE  Comparison:  03/10/2006 CT  Findings:  Right Kidney:  Measures 10.8 cm.  No hydronephrosis.  1.5 x 1.3 cm interpolar hypoechoic lesion is incompletely characterized.  Left Kidney:  Measures 10.5 cm.  No hydronephrosis or focal abnormality.  Bladder:  Not visualized.  The patient has a Foley catheter in place.  Incidental note is made of a mixed cystic and solid mass in the left upper quadrant.  IMPRESSION: No hydronephrosis.  Incompletely characterized 1.5 cm hypoechoic right renal lesion, favored to reflect a mildly complex cyst.  Incompletely characterized 15 cm left upper quadrant mass.  Contrast enhanced CT or MRI recommended to further evaluate the above findings.   Original Report Authenticated By: Jearld Lesch, [name]      Additional labs:   Scheduled Meds: . atorvastatin  40 mg Oral Daily  . cefTRIAXone (ROCEPHIN)  IV  1 g Intravenous Q24H  . insulin aspart  0-5 Units Subcutaneous QHS  . insulin aspart  0-9 Units Subcutaneous TID WC  . insulin glargine  15 Units Subcutaneous QHS  . pantoprazole (PROTONIX) IV  40 mg Intravenous Q12H  . QUEtiapine  400 mg Oral QHS   Continuous Infusions: . sodium  chloride 75 mL/hr at 04/07/12 1610    Principal Problem:   GI bleeding Active Problems:   Atrial fibrillation   Acute on chronic renal failure   Anemia   Lactic acidosis   Leukocytosis   H/O: CVA (cerebrovascular accident)   Depression   Diabetes   HTN (hypertension)   Erectile dysfunction   Acute post-hemorrhagic anemia    Time spent: 45 minutes    [facility]  [facility] Pager  [phone].   If 8PM-8AM, please contact night-coverage at [email or web address], password [facility] 04/07/2012, 2:59 PM  LOS: 2 days

## 2012-04-07 NOTE — Progress Notes (Signed)
Patient ID: [name], male   DOB: 08-19-1940, 72 y.o.   MRN: [id number] Request received for US guided biopsy of a left upper quadrant abdominal soft tissue mass on pt with history of black stools, anemia, weakness, acute on chronic renal failure and weight loss. Imaging studies were reviewed by Dr. Bonnielee Haff. Additional PMH as below.  Exam: pt awake/alert; chest- CTA bilat; heart-RRR; abd- soft,+BS, NT, palpable left upper abd mass; ext- FROM , no edema.    Filed Vitals:   04/07/12 1045 04/07/12 1100 04/07/12 1200 04/07/12 1230  BP: 123/50 122/49 138/63 130/48  Pulse: 73 74 67 41  Temp: 98.4 F (36.9 C) 98.5 F (36.9 C) 97.8 F (36.6 C) 97.9 F (36.6 C)  TempSrc: Oral Oral Oral Oral  Resp: 26 16 23 23   Height:      Weight:      SpO2:  93% 93%    Past Medical History  Diagnosis Date  . Coronary artery disease   . Hypertension   . Depression   . Diabetes mellitus without complication   . Stroke 72 years old  . GERD (gastroesophageal reflux disease)     hx of  . Arthritis     hx of  . Erectile dysfunction   . Atrial fibrillation    Past Surgical History  Procedure Laterality Date  . Coronary artery bypass graft    . Eye surgery      cataracts bilateral  . Carpal tunnel release      bilateral  . Tonsillectomy    . Penile prosthesis implant  03/15/2012    Procedure: PENILE PROTHESIS INFLATABLE;  Surgeon: Lindaann Slough, MD;  Location: [facility];  Service: Urology;  Laterality: N/A;  . Circumcision  03/15/2012    Procedure: CIRCUMCISION ADULT;  Surgeon: Lindaann Slough, MD;  Location: [facility];  Service: Urology;  Laterality: N/A;  . Esophagogastroduodenoscopy N/A 04/06/2012    Procedure: ESOPHAGOGASTRODUODENOSCOPY (EGD);  Surgeon: Graylin Shiver, MD;  Location: [facility];  Service: Endoscopy;  Laterality: N/A;   Ct Abdomen Pelvis Wo Contrast  04/06/2012  *RADIOLOGY REPORT*  Clinical Data: Left upper quadrant mass on ultrasound  CT ABDOMEN AND PELVIS WITHOUT CONTRAST  Technique:   Multidetector CT imaging of the abdomen and pelvis was performed following the standard protocol without intravenous contrast.  Comparison: Ultrasound of the kidneys of 04/05/2012  Findings: The lung bases are clear.  There is moderate cardiomegaly present.  No pericardial effusion is seen.  The liver is unremarkable in the unenhanced state.  Faintly calcified gallstones are noted within the somewhat distended gallbladder and the gallbladder wall is slightly thickened as well and clinical correlation is recommended to exclude cholecystitis.  The pancreas appears somewhat atrophic and the pancreatic duct is not dilated. The adrenal glands are both slightly nodular most consistent with adrenal adenomas.  Metastatic involvement would be difficult to exclude.  The spleen is normal in size.  There is a large rounded soft tissue mass within the left upper quadrant as noted by ultrasound which appears to be intimately associated with the greater curvature of the stomach.  This mass measures approximately 11.1 x 12.7 x 13.2 cm and is somewhat inhomogeneous in attenuation on this unenhanced study.  The primary considerations are that of a gastrointestinal stromal tumor (G I S T tumor), gastric carcinoma or massive adenopathy as with lymphoma. Biopsy could be performed in view of the location of this mass.  The kidneys are unremarkable in the unenhanced state.  No renal  calculi are noted.  No adenopathy is seen.   The abdominal aorta is normal in caliber.  The urinary bladder is unremarkable.  A reservoir for penile prosthesis is noted just anterior to the urinary bladder.  The prostate is slightly prominent.  No abnormality of the colon is seen.  The appendix is unremarkable.  There are degenerative changes diffusely throughout the facet joints of the lower lumbar spine.  IMPRESSION:  1.  Large somewhat inhomogeneous rounded soft tissue mass in the left upper quadrant appears to be intimately associated with the greater  curvature of the stomach and may represent a GIST tumor versus gastric carcinoma  carcinoma  carcinoma or adenopathy as with lymphoma.  Biopsy could be performed. 2.  Faintly calcified gallstones within a somewhat thick-walled gallbladder.  Rule out acute cholecystitis. 3.  Nodular adrenal glands.  Possibly adrenal adenomas but cannot exclude metastatic involvement.   Original Report Authenticated By: Dwyane Dee, [name]    Dg Chest 2 View  04/05/2012  *RADIOLOGY REPORT*  Clinical Data: Altered mental status.  CHEST - 2 VIEW  Comparison: 03/03/2012.  Findings: The cardiac silhouette, mediastinal and hilar contours are stable.  Stable surgical changes from bypass surgery.  No acute pulmonary findings.  No pleural effusion or pneumothorax.  The bony thorax is intact.  IMPRESSION: No acute cardiopulmonary findings.   Original Report Authenticated By: Rudie Meyer, [name]    Ct Head Wo Contrast  04/05/2012  *RADIOLOGY REPORT*  Clinical Data: Weakness and altered mental status.  CT HEAD WITHOUT CONTRAST  Technique:  Contiguous axial images were obtained from the base of the skull through the vertex without contrast.  Comparison: 08/22/2010.  Findings: Stable age related cerebral atrophy, ventriculomegaly and periventricular white matter disease.  No extra-axial fluid collections.  No CT findings for hemispheric infarction and/or intracranial hemorrhage.  No mass lesions.  The brainstem and cerebellum are normal and stable. There is a remote right cerebellar hemispheric infarction.  The bony structures are intact.  No skull fracture.  Mild mucoperiosteal thickening in the left half of the sphenoid sinus and scattered ethmoid air cells.  The mastoid air cells are clear.  IMPRESSION:  1. Stable age related cerebral atrophy, ventriculomegaly and periventricular white matter disease. 2.  Remote right-sided cerebellar infarction. 3.  No acute intracranial findings or mass lesion.   Original Report Authenticated By: Rudie Meyer, [name]    US Renal  04/05/2012  *RADIOLOGY REPORT*  Clinical Data: Acute renal failure  RENAL/URINARY TRACT ULTRASOUND COMPLETE  Comparison:  03/10/2006 CT  Findings:  Right Kidney:  Measures 10.8 cm.  No hydronephrosis.  1.5 x 1.3 cm interpolar hypoechoic lesion is incompletely characterized.  Left Kidney:  Measures 10.5 cm.  No hydronephrosis or focal abnormality.  Bladder:  Not visualized.  The patient has a Foley catheter in place.  Incidental note is made of a mixed cystic and solid mass in the left upper quadrant.  IMPRESSION: No hydronephrosis.  Incompletely characterized 1.5 cm hypoechoic right renal lesion, favored to reflect a mildly complex cyst.  Incompletely characterized 15 cm left upper quadrant mass.  Contrast enhanced CT or MRI recommended to further evaluate the above findings.   Original Report Authenticated By: Jearld Lesch, [name]   Results for orders placed during the hospital encounter of 04/05/12  MRSA PCR SCREENING      Result Value Range   MRSA by PCR NEGATIVE  NEGATIVE  CBC WITH DIFFERENTIAL      Result Value Range   WBC  25.0 (*) 4.0 - 10.5 K/uL   RBC 1.40 (*) 4.22 - 5.81 MIL/uL   Hemoglobin 4.1 (*) 13.0 - 17.0 g/dL   HCT 09.8 (*) 11.9 - 14.7 %   MCV 90.0  78.0 - 100.0 fL   MCH 29.3  26.0 - 34.0 pg   MCHC 32.5  30.0 - 36.0 g/dL   RDW 82.9  56.2 - 13.0 %   Platelets 504 (*) 150 - 400 K/uL   Neutrophils Relative 92 (*) 43 - 77 %   Neutro Abs 22.9 (*) 1.7 - 7.7 K/uL   Lymphocytes Relative 5 (*) 12 - 46 %   Lymphs Abs 1.2  0.7 - 4.0 K/uL   Monocytes Relative 3  3 - 12 %   Monocytes Absolute 0.8  0.1 - 1.0 K/uL   Eosinophils Relative 0  0 - 5 %   Eosinophils Absolute 0.0  0.0 - 0.7 K/uL   Basophils Relative 0  0 - 1 %   Basophils Absolute 0.0  0.0 - 0.1 K/uL  COMPREHENSIVE METABOLIC PANEL      Result Value Range   Sodium 138  135 - 145 mEq/L   Potassium 4.1  3.5 - 5.1 mEq/L   Chloride 101  96 - 112 mEq/L   CO2 19  19 - 32 mEq/L   Glucose, Bld 421  (*) 70 - 99 mg/dL   BUN 865 (*) 6 - 23 mg/dL   Creatinine, Ser 7.84 (*) 0.50 - 1.35 mg/dL   Calcium 8.1 (*) 8.4 - 10.5 mg/dL   Total Protein 6.3  6.0 - 8.3 g/dL   Albumin 2.2 (*) 3.5 - 5.2 g/dL   AST 696 (*) 0 - 37 U/L   ALT 49  0 - 53 U/L   Alkaline Phosphatase 71  39 - 117 U/L   Total Bilirubin 0.2 (*) 0.3 - 1.2 mg/dL   GFR calc non Af Amer 26 (*) >90 mL/min   GFR calc Af Amer 30 (*) >90 mL/min  LACTIC ACID, PLASMA      Result Value Range   Lactic Acid, Venous 3.2 (*) 0.5 - 2.2 mmol/L  ETHANOL      Result Value Range   Alcohol, Ethyl (B) <11  0 - 11 mg/dL  URINALYSIS, ROUTINE W REFLEX MICROSCOPIC      Result Value Range   Color, Urine YELLOW  YELLOW   APPearance HAZY (*) CLEAR   Specific Gravity, Urine 1.019  1.005 - 1.030   pH 5.0  5.0 - 8.0   Glucose, UA NEGATIVE  NEGATIVE mg/dL   Hgb urine dipstick NEGATIVE  NEGATIVE   Bilirubin Urine NEGATIVE  NEGATIVE   Ketones, ur NEGATIVE  NEGATIVE mg/dL   Protein, ur NEGATIVE  NEGATIVE mg/dL   Urobilinogen, UA 0.2  0.0 - 1.0 mg/dL   Nitrite NEGATIVE  NEGATIVE   Leukocytes, UA SMALL (*) NEGATIVE  URINE RAPID DRUG SCREEN (HOSP PERFORMED)      Result Value Range   Opiates NONE DETECTED  NONE DETECTED   Cocaine NONE DETECTED  NONE DETECTED   Benzodiazepines NONE DETECTED  NONE DETECTED   Amphetamines NONE DETECTED  NONE DETECTED   Tetrahydrocannabinol NONE DETECTED  NONE DETECTED   Barbiturates NONE DETECTED  NONE DETECTED  GLUCOSE, CAPILLARY      Result Value Range   Glucose-Capillary 363 (*) 70 - 99 mg/dL   Comment 1 Documented in Chart     Comment 2 Notify RN    [employer]  Result Value Range   Prothrombin Time 15.7 (*) 11.6 - 15.2 seconds   INR 1.28  0.00 - 1.49  APTT      Result Value Range   aPTT 33  24 - 37 seconds  LACTIC ACID, PLASMA      Result Value Range   Lactic Acid, Venous 1.0  0.5 - 2.2 mmol/L  MAGNESIUM      Result Value Range   Magnesium 3.6 (*) 1.5 - 2.5 mg/dL  PHOSPHORUS      Result Value Range    Phosphorus 5.5 (*) 2.3 - 4.6 mg/dL  CBC      Result Value Range   WBC 20.7 (*) 4.0 - 10.5 K/uL   RBC 2.40 (*) 4.22 - 5.81 MIL/uL   Hemoglobin 7.1 (*) 13.0 - 17.0 g/dL   HCT 16.1 (*) 09.6 - 04.5 %   MCV 86.7  78.0 - 100.0 fL   MCH 29.6  26.0 - 34.0 pg   MCHC 34.1  30.0 - 36.0 g/dL   RDW 40.9  81.1 - 91.4 %   Platelets 398  150 - 400 K/uL  TSH      Result Value Range   TSH 3.452  0.350 - 4.500 uIU/mL  BASIC METABOLIC PANEL      Result Value Range   Sodium 142  135 - 145 mEq/L   Potassium 3.4 (*) 3.5 - 5.1 mEq/L   Chloride 109  96 - 112 mEq/L   CO2 21  19 - 32 mEq/L   Glucose, Bld 242 (*) 70 - 99 mg/dL   BUN 98 (*) 6 - 23 mg/dL   Creatinine, Ser 7.82 (*) 0.50 - 1.35 mg/dL   Calcium 7.6 (*) 8.4 - 10.5 mg/dL   GFR calc non Af Amer 36 (*) >90 mL/min   GFR calc Af Amer 42 (*) >90 mL/min  HEMOGLOBIN A1C      Result Value Range   Hemoglobin A1C 6.3 (*) <5.7 %   Mean Plasma Glucose 134 (*) <117 mg/dL  GLUCOSE, CAPILLARY      Result Value Range   Glucose-Capillary 398 (*) 70 - 99 mg/dL  URINE MICROSCOPIC-ADD ON      Result Value Range   WBC, UA 0-2  <3 WBC/hpf   RBC / HPF 0-2  <3 RBC/hpf   Bacteria, UA RARE  RARE   Urine-Other AMORPHOUS URATES/PHOSPHATES    GLUCOSE, CAPILLARY      Result Value Range   Glucose-Capillary 225 (*) 70 - 99 mg/dL   Comment 1 Notify RN     Comment 2 Documented in Chart    CBC      Result Value Range   WBC 21.3 (*) 4.0 - 10.5 K/uL   RBC 2.02 (*) 4.22 - 5.81 MIL/uL   Hemoglobin 6.0 (*) 13.0 - 17.0 g/dL   HCT 95.6 (*) 21.3 - 08.6 %   MCV 85.6  78.0 - 100.0 fL   MCH 29.7  26.0 - 34.0 pg   MCHC 34.7  30.0 - 36.0 g/dL   RDW 57.8 (*) 46.9 - 62.9 %   Platelets 408 (*) 150 - 400 K/uL  GLUCOSE, CAPILLARY      Result Value Range   Glucose-Capillary 251 (*) 70 - 99 mg/dL  GLUCOSE, CAPILLARY      Result Value Range   Glucose-Capillary 243 (*) 70 - 99 mg/dL  GLUCOSE, CAPILLARY      Result Value Range   Glucose-Capillary 160 (*) 70 - 99 mg/dL  BASIC METABOLIC PANEL      Result Value Range   Sodium 146 (*) 135 - 145 mEq/L   Potassium 3.5  3.5 - 5.1 mEq/L   Chloride 113 (*) 96 - 112 mEq/L   CO2 24  19 - 32 mEq/L   Glucose, Bld 113 (*) 70 - 99 mg/dL   BUN 51 (*) 6 - 23 mg/dL   Creatinine, Ser 1.61 (*) 0.50 - 1.35 mg/dL   Calcium 7.7 (*) 8.4 - 10.5 mg/dL   GFR calc non Af Amer 47 (*) >90 mL/min   GFR calc Af Amer 54 (*) >90 mL/min  CBC      Result Value Range   WBC 14.8 (*) 4.0 - 10.5 K/uL   RBC 2.49 (*) 4.22 - 5.81 MIL/uL   Hemoglobin 7.4 (*) 13.0 - 17.0 g/dL   HCT 09.6 (*) 04.5 - 40.9 %   MCV 86.7  78.0 - 100.0 fL   MCH 29.7  26.0 - 34.0 pg   MCHC 34.3  30.0 - 36.0 g/dL   RDW 81.1 (*) 91.4 - 78.2 %   Platelets 360  150 - 400 K/uL  CBC      Result Value Range   WBC 17.9 (*) 4.0 - 10.5 K/uL   RBC 2.67 (*) 4.22 - 5.81 MIL/uL   Hemoglobin 8.0 (*) 13.0 - 17.0 g/dL   HCT 95.6 (*) 21.3 - 08.6 %   MCV 85.8  78.0 - 100.0 fL   MCH 30.0  26.0 - 34.0 pg   MCHC 34.9  30.0 - 36.0 g/dL   RDW 57.8 (*) 46.9 - 62.9 %   Platelets 336  150 - 400 K/uL  GLUCOSE, CAPILLARY      Result Value Range   Glucose-Capillary 160 (*) 70 - 99 mg/dL  GLUCOSE, CAPILLARY      Result Value Range   Glucose-Capillary 107 (*) 70 - 99 mg/dL   Comment 1 Notify RN    GLUCOSE, CAPILLARY      Result Value Range   Glucose-Capillary 134 (*) 70 - 99 mg/dL   Comment 1 Notify RN    CBC      Result Value Range   WBC 16.8 (*) 4.0 - 10.5 K/uL   RBC 3.42 (*) 4.22 - 5.81 MIL/uL   Hemoglobin 10.3 (*) 13.0 - 17.0 g/dL   HCT 52.8 (*) 41.3 - 24.4 %   MCV 86.8  78.0 - 100.0 fL   MCH 30.1  26.0 - 34.0 pg   MCHC 34.7  30.0 - 36.0 g/dL   RDW 01.0  27.2 - 53.6 %   Platelets 342  150 - 400 K/uL  POCT I-STAT 3, BLOOD GAS (G3P V)      Result Value Range   pH, Ven 7.344 (*) 7.250 - 7.300   pCO2, Ven 34.1 (*) 45.0 - 50.0 mmHg   pO2, Ven 74.0 (*) 30.0 - 45.0 mmHg   Bicarbonate 18.6 (*) 20.0 - 24.0 mEq/L   TCO2 20  0 - 100 mmol/L   O2 Saturation 94.0      Acid-base deficit 7.0 (*) 0.0 - 2.0 mmol/L   Sample type VENOUS    POCT I-STAT TROPONIN I      Result Value Range   Troponin i, poc 0.02  0.00 - 0.08 ng/mL   Comment 3           OCCULT BLOOD, POC DEVICE      Result Value Range   Fecal Occult Bld POSITIVE (*)  NEGATIVE  PREPARE RBC (CROSSMATCH)      Result Value Range   Order Confirmation ORDER PROCESSED BY BLOOD BANK    TYPE AND SCREEN      Result Value Range   ABO/RH(D) O POS     Antibody Screen NEG     Sample Expiration 04/08/2012     Unit Number [id number]     Blood Component Type RBC LR PHER2     Unit division 00     Status of Unit ISSUED,FINAL     Transfusion Status OK TO TRANSFUSE     Crossmatch Result Compatible     Unit Number [id number]     Blood Component Type RED CELLS,LR     Unit division 00     Status of Unit ISSUED,FINAL     Transfusion Status OK TO TRANSFUSE     Crossmatch Result Compatible     Unit Number [id number]     Blood Component Type RED CELLS,LR     Unit division 00     Status of Unit ISSUED,FINAL     Transfusion Status OK TO TRANSFUSE     Crossmatch Result Compatible     Unit Number [id number]     Blood Component Type RED CELLS,LR     Unit division 00     Status of Unit ISSUED,FINAL     Transfusion Status OK TO TRANSFUSE     Crossmatch Result Compatible     Unit Number [id number]     Blood Component Type RED CELLS,LR     Unit division 00     Status of Unit ISSUED     Transfusion Status OK TO TRANSFUSE     Crossmatch Result Compatible     Unit Number [id number]     Blood Component Type RBC CPDA1, LR     Unit division 00     Status of Unit ISSUED     Transfusion Status OK TO TRANSFUSE     Crossmatch Result Compatible    ABO/RH      Result Value Range   ABO/RH(D) O POS    PREPARE RBC (CROSSMATCH)      Result Value Range   Order Confirmation ORDER PROCESSED BY BLOOD BANK    PREPARE RBC (CROSSMATCH)      Result Value Range   Order Confirmation ORDER PROCESSED BY BLOOD  BANK     A/P: Pt with hx anemia, subacute GI bleed, wt loss ,acute on chronic renal failure and LUQ soft tissue mass. Also with PMH of afib ,CVA and on asa, plavix- last dose plavix 48 hrs ago. Will tent plan to perform bx on 2/24 via US guidance (allow 5-7 days off plavix to minimize bleeding risk of bx).Details/risks of procedure d/w pt/pt's daughter with their understanding and consent.

## 2012-04-07 NOTE — Progress Notes (Signed)
Subjective: No bowel movements overnight. No abdominal pain  Objective: Vital signs in last 24 hours: Temp:  [97.4 F (36.3 C)-98.8 F (37.1 C)] 98.6 F (37 C) (02/20 0930) Pulse Rate:  [66-88] 80 (02/20 0930) Resp:  [15-25] 22 (02/20 0930) BP: (85-148)/(31-64) 143/57 mmHg (02/20 0930) SpO2:  [95 %-100 %] 95 % (02/20 0737) Weight:  [75.1 kg (165 lb 9.1 oz)] 75.1 kg (165 lb 9.1 oz) (02/19 2330) Weight change: 1.1 kg (2 lb 6.8 oz) Last BM Date: 04/05/12  PE: GEN:  Somnolent-appearing but arousable HEENT:  Pale conjunctiva and mucous membranes ABD:  Soft, non-tender, palpable mobile mass left upper quadrant  Lab Results: CBC    Component Value Date/Time   WBC 14.8* 04/07/2012 0535   RBC 2.49* 04/07/2012 0535   HGB 7.4* 04/07/2012 0535   HCT 21.6* 04/07/2012 0535   PLT 360 04/07/2012 0535   MCV 86.7 04/07/2012 0535   MCH 29.7 04/07/2012 0535   MCHC 34.3 04/07/2012 0535   RDW 15.6* 04/07/2012 0535   LYMPHSABS 1.2 04/05/2012 1244   MONOABS 0.8 04/05/2012 1244   EOSABS 0.0 04/05/2012 1244   BASOSABS 0.0 04/05/2012 1244   Studies/Results: Renal ultrasound and CT abdomen:  Left upper quadrant mass > 10 x 10 cm, complex solid and cystic  Assessment:  1.  Melena.  Negative endoscopy yesterday by Dr. Evette Cristal. 2.  Left upper quadrant mass, complex solid and cystic, differential considerations to include GI stromal tumor (GIST), lymphoma, sarcoma.  This area does not seem consistent with hematoma. 3.  Acute blood loss anemia.  Persistent and repeated transfusion requirement.  Off clopidigrel and aspirin for the past 48 hours.  Plan:  1.  Interventional radiology consult for consideration of imaging-guided biopsy of left upper quadrant mass, I have called in consult myself.  IR team would likely need to discuss consent for this procedure with patient's daughter, Velna Hatchet, at [phone]. 2.  Continue to hold clopidigrel and aspirin. 3.  Case discussed with Dr. Waymon Amato of the [facility]  team.  Freddy Jaksch 04/07/2012, 9:37 AM

## 2012-04-07 NOTE — Progress Notes (Signed)
Pt HgB post-transfusion (PRBC 2 units) is 8.0. Craige Cotta, NP notified via text page. No new orders at this time. Will monitor.  M.Foster Simpson, RN

## 2012-04-08 LAB — TYPE AND SCREEN
ABO/RH(D): O POS
Antibody Screen: NEGATIVE
Unit division: 0
Unit division: 0
Unit division: 0
Unit division: 0
Unit division: 0
Unit division: 0

## 2012-04-08 LAB — CBC
HCT: 28.2 % — ABNORMAL LOW (ref 39.0–52.0)
MCHC: 35.1 g/dL (ref 30.0–36.0)
RDW: 15.3 % (ref 11.5–15.5)
WBC: 15.6 10*3/uL — ABNORMAL HIGH (ref 4.0–10.5)

## 2012-04-08 LAB — GLUCOSE, CAPILLARY: Glucose-Capillary: 199 mg/dL — ABNORMAL HIGH (ref 70–99)

## 2012-04-08 LAB — BASIC METABOLIC PANEL
BUN: 22 mg/dL (ref 6–23)
Chloride: 113 mEq/L — ABNORMAL HIGH (ref 96–112)
Creatinine, Ser: 1.13 mg/dL (ref 0.50–1.35)
GFR calc Af Amer: 74 mL/min — ABNORMAL LOW (ref >90)
GFR calc non Af Amer: 64 mL/min — ABNORMAL LOW (ref >90)
Potassium: 3.3 mEq/L — ABNORMAL LOW (ref 3.5–5.1)

## 2012-04-08 LAB — URINE CULTURE: Colony Count: 60000

## 2012-04-08 MED ORDER — POTASSIUM CHLORIDE CRYS ER 20 MEQ PO TBCR
40.0000 meq | EXTENDED_RELEASE_TABLET | Freq: Once | ORAL | Status: AC
  Administered 2012-04-08: 40 meq via ORAL
  Filled 2012-04-08: qty 2

## 2012-04-08 MED ORDER — BOOST / RESOURCE BREEZE PO LIQD
1.0000 | Freq: Two times a day (BID) | ORAL | Status: DC
  Administered 2012-04-08 – 2012-04-13 (×7): 1 via ORAL

## 2012-04-08 MED ORDER — PANTOPRAZOLE SODIUM 40 MG PO TBEC
40.0000 mg | DELAYED_RELEASE_TABLET | Freq: Every day | ORAL | Status: DC
  Administered 2012-04-08 – 2012-04-14 (×7): 40 mg via ORAL
  Filled 2012-04-08 (×7): qty 1

## 2012-04-08 MED ORDER — DEXTROSE 5 % IV SOLN
1.0000 g | INTRAVENOUS | Status: DC
  Administered 2012-04-08 – 2012-04-12 (×5): 1 g via INTRAVENOUS
  Filled 2012-04-08 (×5): qty 10

## 2012-04-08 NOTE — Progress Notes (Signed)
NUTRITION FOLLOW UP  DOCUMENTATION CODES  Per approved criteria   -Severe malnutrition in the context of acute illness   Intervention:   1. Resource Breeze po BID, each supplement provides 250 kcal and 9 grams of protein. 2. Monitor magnesium, potassium, and phosphorus daily for at least 3 days during diet advancement, MD to replete as needed, as pt is at risk for refeeding syndrome given dx of severe malnutrition. 3. RD to continue to follow nutrition care plan  Nutrition Dx:   Inadequate oral intake now related to poor appetite as evidenced by pt report.  Goal:   Pt to meet >/= 90% of their estimated nutrition needs.  Monitor:   weight trends, lab trends, I/O's, PO intake, supplement tolerance, diet advancement.  Assessment:   Upper endoscopy was normal. S/p 4 PRBC units, planning for additionaly 2 units today.. Interventional radiology consulted for consideration of imaging-guided biopsy of left upper quadrant mass. Noted pt must be off Plavix for 5-7 days prior to biopsy, must delay bx for 2/24.  No BM since 2/18. Continues on clear liquids, per MD - if pt is not to have colonoscopy then consider advancing diet.  Discussed oral intake with patient at bedside. He confirms that he has lost approximately 20 lb since January 2/2 poor oral intake. He notes that he doesn't have a taste for anything. Eating very little during the day PTA and he cannot remember when he last ate a full meal. Agreeable to trying [employer].  Pt meets criteria for severe MALNUTRITION in the context of acute illness as evidenced by 11% wt loss x 1 month and intake of <50% of estimated energy needs x at least 5 days. Pt is at risk for refeeding syndrome given this dx. Current potassium is low, most recent phosphorus and magnesium elevated.  Height: Ht Readings from Last 1 Encounters:  04/05/12 5\' 5"  (1.651 m)    Weight Status:   Wt Readings from Last 1 Encounters:  04/06/12 165 lb 9.1 oz (75.1 kg)     Re-estimated needs:  Kcal: 1700 - 1850 Protein: 74 - 85 grams Fluid: 1.8 - 2 liters  Skin: intact  Diet Order: Clear Liquid   Intake/Output Summary (Last 24 hours) at 04/08/12 1033 Last data filed at 04/08/12 0935  Gross per 24 hour  Intake 2578.75 ml  Output   3000 ml  Net -421.25 ml    Last BM: 2/18   Labs:   Recent Labs Lab 04/05/12 1244 04/05/12 2352 04/06/12 0837 04/07/12 0535 04/08/12 0430  NA 138  --  142 146* 144  K 4.1  --  3.4* 3.5 3.3*  CL 101  --  109 113* 113*  CO2 19  --  21 24 23   BUN 131*  --  98* 51* 22  CREATININE 2.41*  --  1.80* 1.47* 1.13  CALCIUM 8.1*  --  7.6* 7.7* 7.9*  MG  --  3.6*  --   --   --   PHOS  --  5.5*  --   --   --   GLUCOSE 421*  --  242* 113* 109*    CBG (last 3)   Recent Labs  04/07/12 1622 04/07/12 2209 04/08/12 0833  GLUCAP 111* 134* 110*    Scheduled Meds: . atorvastatin  40 mg Oral Daily  . cefTRIAXone (ROCEPHIN)  IV  1 g Intravenous Q24H  . cholecalciferol  5,000 Units Oral Daily  . insulin aspart  0-5 Units Subcutaneous QHS  .  insulin aspart  0-9 Units Subcutaneous TID WC  . insulin glargine  15 Units Subcutaneous QHS  . metoprolol tartrate  12.5 mg Oral BID  . pantoprazole (PROTONIX) IV  40 mg Intravenous Q12H  . QUEtiapine  400 mg Oral QHS  . Tamsulosin HCl  0.4 mg Oral Daily    Continuous Infusions:  none  Jarold Motto MS, RD, LDN Pager: [phone] After-hours pager: [phone]

## 2012-04-08 NOTE — Progress Notes (Signed)
[facility] PROGRESS NOTE  [name] WUJ:[id number] DOB: 06-15-1940 DOA: 04/05/2012 PCP: [name], MD  Brief narrative 72 year old male with PMH of CAD, HTN, depression, DM, GERD, atrial fibrillation on aspirin and Plavix, recent admission for penile prosthesis implant admitted to the hospital on 04/05/12 with complaints of generalized weakness, altered mental status, increasing fatigue and black stools. In the ED, hemoglobin 4.1 (was normal 3 weeks ago), creatinine 2.4, white blood cell 25,000. He was admitted for presumed GI bleeding and symptomatic anemia evaluation and management.  Assessment/Plan: 1. Subacute upper GI bleeding: negative EGD on 2/19. No BM since 2/19.[facility] following. Clinically no further bleeding. Source of blood loss not determined- ? Colonoscopy- GI to address. Patient still on clear diet- if no plans for colonoscopy then consider advancing diet. 2. Left upper quadrant mass: Initially seen on renal ultrasound and confirmed with non contrasted CT abdomen. Patient does not complain of abdominal pain. Has decreased appetite for last 3 weeks.Does not seem like hematoma.DD-GIST, Lymphoma, sarcoma. Off aspirin and Plavix for 72 hours at least. Imaging guided biopsy scheduled for 1/24, by IR 3. Acute blood loss anemia: Status post 6 PRBCs and Hb seems to have stabilized. Follow CBC in am. 4. Atrial fibrillation: in sinus rhythm. Antiplatelets held second GI bleeding. Resumed low dose beta blockers 5. Acute on chronic kidney disease: ARF resolved. D/c IVF. 6. Spingomonas Paucimobilis urinary tract infection, recent urinary tract manipulation/penile prosthesis implantation: urine cultures showed negative on 2/20, but today reported as above. Resume Rocephin. 7. Leukocytosis: ? stress margination. No fevers and improved since admission. Stable. 8. History of CVA: CT head without acute findings. No focal deficits at this time. Antiplatelets held secondary to GI  bleed. 9. History of depression: Continue Seroquel. 10. Uncontrolled type II DM: Adjust insulins and monitor. Reasonably controlled. 11. Hypertension: controlled. Resumed low dose beta blockers. 12. Altered mental status: Possibly secondary to symptomatic anemia and UTI. Seems to have resolved. Monitor. 13. Recent penile implant and circumcision: No acute findings. Discussed with Dr. Brunilda Payor on 2/20-sutures are self absorbable and okay to use condom catheter. Outpatient followup. 14. Mild hypernatremia and hyperchloremia: improved. D/C IVF. Follow BMP in a.m.  15. Lactic acidosis: Likely secondary to hypoperfusion from GI bleeding and anemia. Resolved.    Code Status:  DO NOT RESUSCITATE. Family Communication:  Discussed with patient and with patient's daughter Ms.Sena Slate via phone on 2/20. Disposition Plan:  Transfer to telemetry.   Consultants:  [facility] gastroenterology.  Interventional radiology.  Procedures:   EGD on 2/19  Antibiotics:  Rocephin-DC'd on 2/20  HPI/Subjective:  Denies complaints. No nausea, vomiting or abdominal pain. No further BMs. Asking for solid food.  Objective: Filed Vitals:   04/07/12 1624 04/07/12 2050 04/08/12 0000 04/08/12 0400  BP: 141/60 132/49 108/50 131/56  Pulse: 73 71 75 67  Temp: 98.7 F (37.1 C) 98.2 F (36.8 C) 97.3 F (36.3 C) 98.6 F (37 C)  TempSrc: Oral Oral Oral Oral  Resp: 21 19 20 20   Height:      Weight:      SpO2: 96% 98% 97% 98%    Intake/Output Summary (Last 24 hours) at 04/08/12 0823 Last data filed at 04/08/12 0700  Gross per 24 hour  Intake 2577.5 ml  Output   2700 ml  Net -122.5 ml   Filed Weights   04/05/12 1830 04/06/12 2330  Weight: 74 kg (163 lb 2.3 oz) 75.1 kg (165 lb 9.1 oz)    Exam:   General exam: Comfortable.  Respiratory system: Clear. No increased work of breathing.  Cardiovascular system: S1 & S2 heard, RRR. No JVD, murmurs, gallops, clicks or pedal edema. Telemetry shows sinus  rhythm in the 70s with occasional bigemini.  Gastrointestinal system: Abdomen is nondistended, soft and nontender. Normal bowel sounds heard. Palpable mass left upper quadrant- firm, non tender, not pulsatile, margins not clearly defined.  Central nervous system: Alert and oriented. No focal neurological deficits.  Extremities: Symmetric 5 x 5 power.   Data Reviewed: Basic Metabolic Panel:  Recent Labs Lab 04/05/12 1244 04/05/12 2352 04/06/12 0837 04/07/12 0535 04/08/12 0430  NA 138  --  142 146* 144  K 4.1  --  3.4* 3.5 3.3*  CL 101  --  109 113* 113*  CO2 19  --  21 24 23   GLUCOSE 421*  --  242* 113* 109*  BUN 131*  --  98* 51* 22  CREATININE 2.41*  --  1.80* 1.47* 1.13  CALCIUM 8.1*  --  7.6* 7.7* 7.9*  MG  --  3.6*  --   --   --   PHOS  --  5.5*  --   --   --    Liver Function Tests:  Recent Labs Lab 04/05/12 1244  AST 118*  ALT 49  ALKPHOS 71  BILITOT 0.2*  PROT 6.3  ALBUMIN 2.2*   No results found for this basename: LIPASE, AMYLASE,  in the last 168 hours No results found for this basename: AMMONIA,  in the last 168 hours CBC:  Recent Labs Lab 04/05/12 1244  04/06/12 2300 04/07/12 0535 04/07/12 1406 04/07/12 2310 04/08/12 0430  WBC 25.0*  < > 17.9* 14.8* 16.8* 15.5* 15.6*  NEUTROABS 22.9*  --   --   --   --   --   --   HGB 4.1*  < > 8.0* 7.4* 10.3* 9.9* 9.9*  HCT 12.6*  < > 22.9* 21.6* 29.7* 28.1* 28.2*  MCV 90.0  < > 85.8 86.7 86.8 86.7 86.0  PLT 504*  < > 336 360 342 321 356  < > = values in this interval not displayed. Cardiac Enzymes: No results found for this basename: CKTOTAL, CKMB, CKMBINDEX, TROPONINI,  in the last 168 hours BNP (last 3 results) No results found for this basename: PROBNP,  in the last 8760 hours CBG:  Recent Labs Lab 04/06/12 2208 04/07/12 0736 04/07/12 1238 04/07/12 1622 04/07/12 2209  GLUCAP 160* 107* 134* 111* 134*    Recent Results (from the past 240 hour(s))  MRSA PCR SCREENING     Status: None    Collection Time    04/05/12  6:35 PM      Result Value Range Status   MRSA by PCR NEGATIVE  NEGATIVE Final   Comment:            The GeneXpert MRSA Assay (FDA     approved for NASAL specimens     only), is one component of a     comprehensive MRSA colonization     surveillance program. It is not     intended to diagnose MRSA     infection nor to guide or     monitor treatment for     MRSA infections.  URINE CULTURE     Status: None   Collection Time    04/05/12 11:06 PM      Result Value Range Status   Specimen Description URINE, CLEAN CATCH   Final   Special Requests NONE  Final   Culture  Setup Time 04/05/2012 23:47   Final   Colony Count 60,000 COLONIES/ML   Final   Culture SPHINGOMONAS PAUCIMOBILIS   Final   Report Status 04/08/2012 FINAL   Final   Organism ID, Bacteria SPHINGOMONAS PAUCIMOBILIS   Final     Studies: Ct Abdomen Pelvis Wo Contrast  04/06/2012  *RADIOLOGY REPORT*  Clinical Data: Left upper quadrant mass on ultrasound  CT ABDOMEN AND PELVIS WITHOUT CONTRAST  Technique:  Multidetector CT imaging of the abdomen and pelvis was performed following the standard protocol without intravenous contrast.  Comparison: Ultrasound of the kidneys of 04/05/2012  Findings: The lung bases are clear.  There is moderate cardiomegaly present.  No pericardial effusion is seen.  The liver is unremarkable in the unenhanced state.  Faintly calcified gallstones are noted within the somewhat distended gallbladder and the gallbladder wall is slightly thickened as well and clinical correlation is recommended to exclude cholecystitis.  The pancreas appears somewhat atrophic and the pancreatic duct is not dilated. The adrenal glands are both slightly nodular most consistent with adrenal adenomas.  Metastatic involvement would be difficult to exclude.  The spleen is normal in size.  There is a large rounded soft tissue mass within the left upper quadrant as noted by ultrasound which appears to be  intimately associated with the greater curvature of the stomach.  This mass measures approximately 11.1 x 12.7 x 13.2 cm and is somewhat inhomogeneous in attenuation on this unenhanced study.  The primary considerations are that of a gastrointestinal stromal tumor (G I S T tumor), gastric carcinoma or massive adenopathy as with lymphoma. Biopsy could be performed in view of the location of this mass.  The kidneys are unremarkable in the unenhanced state.  No renal calculi are noted.  No adenopathy is seen.   The abdominal aorta is normal in caliber.  The urinary bladder is unremarkable.  A reservoir for penile prosthesis is noted just anterior to the urinary bladder.  The prostate is slightly prominent.  No abnormality of the colon is seen.  The appendix is unremarkable.  There are degenerative changes diffusely throughout the facet joints of the lower lumbar spine.  IMPRESSION:  1.  Large somewhat inhomogeneous rounded soft tissue mass in the left upper quadrant appears to be intimately associated with the greater curvature of the stomach and may represent a GIST tumor versus gastric carcinoma  carcinoma  carcinoma or adenopathy as with lymphoma.  Biopsy could be performed. 2.  Faintly calcified gallstones within a somewhat thick-walled gallbladder.  Rule out acute cholecystitis. 3.  Nodular adrenal glands.  Possibly adrenal adenomas but cannot exclude metastatic involvement.   Original Report Authenticated By: Dwyane Dee, [name]      Additional labs:   Scheduled Meds: . atorvastatin  40 mg Oral Daily  . cholecalciferol  5,000 Units Oral Daily  . insulin aspart  0-5 Units Subcutaneous QHS  . insulin aspart  0-9 Units Subcutaneous TID WC  . insulin glargine  15 Units Subcutaneous QHS  . metoprolol tartrate  12.5 mg Oral BID  . pantoprazole (PROTONIX) IV  40 mg Intravenous Q12H  . potassium chloride  40 mEq Oral Once  . QUEtiapine  400 mg Oral QHS  . Tamsulosin HCl  0.4 mg Oral Daily   Continuous  Infusions:    Principal Problem:   GI bleeding Active Problems:   Atrial fibrillation   Acute on chronic renal failure   Anemia   Lactic acidosis  Leukocytosis   H/O: CVA (cerebrovascular accident)   Depression   Diabetes   HTN (hypertension)   Erectile dysfunction   Acute post-hemorrhagic anemia   Left upper quadrant abdominal mass    Time spent: 35 minutes    [facility]  [facility] Pager [phone].   If 8PM-8AM, please contact night-coverage at [email or web address], password [facility] 04/08/2012, 8:23 AM  LOS: 3 days

## 2012-04-08 NOTE — Progress Notes (Signed)
Notes by IR reviewed.  Will revisit patient next week after imaging-guided biopsy of left upper quadrant mass done by IR.  Please call us back in the interim as needed.

## 2012-04-08 NOTE — ED Provider Notes (Signed)
I have personally seen and examined the patient.  I have discussed the plan of care with the resident.  I have reviewed the documentation on PMH/FH/Soc. History.  I have reviewed the documentation of the resident and agree.   Date: 04/05/2012  Rate: 79  Rhythm: sinus  QRS Axis: nml  Intervals: nml  ST/T Wave abnormalities: nonspecific ST changes  Conduction Disutrbances:right bundle branch block  Narrative Interpretation:    Pt checked on frequently with resident.  With recent penile surgery (though no obvious penile complications, no obstructive uropathy) with acute kidney injury, hyperglycemia as well as severe GI bleed with severe anemia.  He was stabilized in the ED  CRITICAL CARE Performed by: Joya Gaskins   Total critical care time: 37  Critical care time was exclusive of separately billable procedures and treating other patients.  Critical care was necessary to treat or prevent imminent or life-threatening deterioration.  Critical care was time spent personally by me on the following activities: development of treatment plan with patient and/or surrogate as well as nursing, discussions with consultants, evaluation of patient's response to treatment, examination of patient, obtaining history from patient or surrogate, ordering and performing treatments and interventions, ordering and review of laboratory studies, ordering and review of radiographic studies, pulse oximetry and re-evaluation of patient's condition.     Joya Gaskins, MD 04/08/12 [phone]

## 2012-04-08 NOTE — Progress Notes (Signed)
Report called to [place], Rn on [facility] [place]; pending transfer to [id number].

## 2012-04-09 LAB — BASIC METABOLIC PANEL
BUN: 15 mg/dL (ref 6–23)
CO2: 24 mEq/L (ref 19–32)
Chloride: 110 mEq/L (ref 96–112)
GFR calc non Af Amer: 65 mL/min — ABNORMAL LOW (ref >90)
Glucose, Bld: 128 mg/dL — ABNORMAL HIGH (ref 70–99)
Potassium: 3.7 mEq/L (ref 3.5–5.1)
Sodium: 142 mEq/L (ref 135–145)

## 2012-04-09 LAB — GLUCOSE, CAPILLARY
Glucose-Capillary: 136 mg/dL — ABNORMAL HIGH (ref 70–99)
Glucose-Capillary: 183 mg/dL — ABNORMAL HIGH (ref 70–99)
Glucose-Capillary: 254 mg/dL — ABNORMAL HIGH (ref 70–99)

## 2012-04-09 LAB — CBC
HCT: 30 % — ABNORMAL LOW (ref 39.0–52.0)
Hemoglobin: 10.2 g/dL — ABNORMAL LOW (ref 13.0–17.0)
RBC: 3.41 MIL/uL — ABNORMAL LOW (ref 4.22–5.81)
WBC: 14.4 10*3/uL — ABNORMAL HIGH (ref 4.0–10.5)

## 2012-04-09 MED ORDER — QUETIAPINE FUMARATE 200 MG PO TABS: 200.0000 mg | ORAL_TABLET | Freq: Every day | ORAL | Status: DC

## 2012-04-09 MED ORDER — QUETIAPINE FUMARATE 200 MG PO TABS
200.0000 mg | ORAL_TABLET | Freq: Every day | ORAL | Status: DC
  Administered 2012-04-09 – 2012-04-14 (×6): 200 mg via ORAL
  Filled 2012-04-09 (×6): qty 1

## 2012-04-09 NOTE — Progress Notes (Signed)
[facility] PROGRESS NOTE  [name]:[id number] DOB: 04/23/1940 DOA: 04/05/2012 PCP: [name], MD  Brief narrative 72 year old male with PMH of CAD, HTN, depression, DM, GERD, atrial fibrillation on aspirin and Plavix, recent admission for penile prosthesis implant admitted to the hospital on 04/05/12 with complaints of generalized weakness, altered mental status, increasing fatigue and black stools. In the ED, hemoglobin 4.1 (was normal 3 weeks ago), creatinine 2.4, white blood cell 25,000. He was admitted for presumed GI bleeding and symptomatic anemia evaluation and management.  Assessment/Plan: 1. Subacute upper GI bleeding: negative EGD on 2/19. No BM since 2/19.[facility] following. Clinically no further bleeding. Source of blood loss not determined. D/W GI on 2/21- no plans for colonoscopy until LUQ mass issue addressed. Black stools 2/21- likely residual malena  2. Left upper quadrant mass: Initially seen on renal ultrasound and confirmed with non contrasted CT abdomen. Patient does not complain of abdominal pain. Has decreased appetite for last 3 weeks.Does not seem like hematoma.DD-GIST, Lymphoma, sarcoma. Off aspirin and Plavix for 72 hours at least. Imaging guided biopsy scheduled for 1/24, by IR 3. Acute blood loss anemia: Status post 6 PRBCs and Hb seems to have stabilized. Stable. 4. Atrial fibrillation: in sinus rhythm. Antiplatelets held second GI bleeding. Resumed low dose beta blockers 5. Acute on chronic kidney disease: ARF resolved. D/c IVF. 6. Spingomonas Paucimobilis urinary tract infection, recent urinary tract manipulation/penile prosthesis implantation: urine cultures showed negative on 2/20, but today reported as above. Resumed Rocephin- sensitive. 7. Leukocytosis: ? stress margination. No fevers and improved since admission. Stable. 8. History of CVA: CT head without acute findings. No focal deficits at this time. Antiplatelets held secondary to GI  bleed. 9. History of depression: Continue Seroquel. 10. Uncontrolled type II DM: Adjust insulins and monitor. Reasonably controlled. 11. Hypertension: controlled. Resumed low dose beta blockers. 12. Altered mental status: Possibly secondary to symptomatic anemia and UTI. Resolved. Monitor. 13. Recent penile implant and circumcision: No acute findings. Discussed with Dr. Brunilda Payor on 2/20-sutures are self absorbable and okay to use condom catheter. Outpatient followup. 14. Mild hypernatremia and hyperchloremia: improved. D/C IVF. Follow BMP in a.m.  15. Lactic acidosis: Likely secondary to hypoperfusion from GI bleeding and anemia. Resolved.    Code Status:  DO NOT RESUSCITATE. Family Communication:  Discussed with patient. Disposition Plan: Not medically ready for D/C. Home when ready.   Consultants:  [facility] gastroenterology.  Interventional radiology.  Procedures:   EGD on 2/19  Antibiotics:  Rocephin-DC'd on 2/20  HPI/Subjective:  Denies complaints. Dark BM on 2/21.  Objective: Filed Vitals:   04/08/12 2100 04/09/12 0500 04/09/12 1203 04/09/12 1346  BP: 126/66 143/74 134/70 119/69  Pulse: 60 52 93 75  Temp: 99.2 F (37.3 C) 97.7 F (36.5 C)  97.8 F (36.6 C)  TempSrc:    Oral  Resp: 20 18  20   Height:      Weight:      SpO2: 95% 96%  100%    Intake/Output Summary (Last 24 hours) at 04/09/12 1614 Last data filed at 04/09/12 1347  Gross per 24 hour  Intake    600 ml  Output   1550 ml  Net   -950 ml   Filed Weights   04/05/12 1830 04/06/12 2330 04/08/12 1100  Weight: 74 kg (163 lb 2.3 oz) 75.1 kg (165 lb 9.1 oz) 79.652 kg (175 lb 9.6 oz)    Exam:   General exam: Comfortable.  Respiratory system: Clear. No increased work of breathing.  Cardiovascular system: S1 & S2 heard, RRR. No JVD, murmurs, gallops, clicks or pedal edema. Telemetry shows sinus rhythm in the 70s with occasional PVC's.  Gastrointestinal system: Abdomen is nondistended, soft and  nontender. Normal bowel sounds heard. Palpable mass left upper quadrant- firm, non tender, not pulsatile, margins not clearly defined.  Central nervous system: Alert and oriented. No focal neurological deficits.  Extremities: Symmetric 5 x 5 power.   Data Reviewed: Basic Metabolic Panel:  Recent Labs Lab 04/05/12 1244 04/05/12 2352 04/06/12 0837 04/07/12 0535 04/08/12 0430 04/09/12 0532  NA 138  --  142 146* 144 142  K 4.1  --  3.4* 3.5 3.3* 3.7  CL 101  --  109 113* 113* 110  CO2 19  --  21 24 23 24   GLUCOSE 421*  --  242* 113* 109* 128*  BUN 131*  --  98* 51* 22 15  CREATININE 2.41*  --  1.80* 1.47* 1.13 1.12  CALCIUM 8.1*  --  7.6* 7.7* 7.9* 8.1*  MG  --  3.6*  --   --   --   --   PHOS  --  5.5*  --   --   --   --    Liver Function Tests:  Recent Labs Lab 04/05/12 1244  AST 118*  ALT 49  ALKPHOS 71  BILITOT 0.2*  PROT 6.3  ALBUMIN 2.2*   No results found for this basename: LIPASE, AMYLASE,  in the last 168 hours No results found for this basename: AMMONIA,  in the last 168 hours CBC:  Recent Labs Lab 04/05/12 1244  04/07/12 0535 04/07/12 1406 04/07/12 2310 04/08/12 0430 04/09/12 0532  WBC 25.0*  < > 14.8* 16.8* 15.5* 15.6* 14.4*  NEUTROABS 22.9*  --   --   --   --   --   --   HGB 4.1*  < > 7.4* 10.3* 9.9* 9.9* 10.2*  HCT 12.6*  < > 21.6* 29.7* 28.1* 28.2* 30.0*  MCV 90.0  < > 86.7 86.8 86.7 86.0 88.0  PLT 504*  < > 360 342 321 356 331  < > = values in this interval not displayed. Cardiac Enzymes: No results found for this basename: CKTOTAL, CKMB, CKMBINDEX, TROPONINI,  in the last 168 hours BNP (last 3 results) No results found for this basename: PROBNP,  in the last 8760 hours CBG:  Recent Labs Lab 04/08/12 1126 04/08/12 1650 04/08/12 2043 04/09/12 0735 04/09/12 1159  GLUCAP 190* 199* 260* 136* 197*    Recent Results (from the past 240 hour(s))  MRSA PCR SCREENING     Status: None   Collection Time    04/05/12  6:35 PM      Result  Value Range Status   MRSA by PCR NEGATIVE  NEGATIVE Final   Comment:            The GeneXpert MRSA Assay (FDA     approved for NASAL specimens     only), is one component of a     comprehensive MRSA colonization     surveillance program. It is not     intended to diagnose MRSA     infection nor to guide or     monitor treatment for     MRSA infections.  URINE CULTURE     Status: None   Collection Time    04/05/12 11:06 PM      Result Value Range Status   Specimen Description URINE, CLEAN CATCH   Final  Special Requests NONE   Final   Culture  Setup Time 04/05/2012 23:47   Final   Colony Count 60,000 COLONIES/ML   Final   Culture SPHINGOMONAS PAUCIMOBILIS   Final   Report Status 04/08/2012 FINAL   Final   Organism ID, Bacteria SPHINGOMONAS PAUCIMOBILIS   Final     Studies: No results found.   Additional labs:   Scheduled Meds: . atorvastatin  40 mg Oral Daily  . cefTRIAXone (ROCEPHIN)  IV  1 g Intravenous Q24H  . cholecalciferol  5,000 Units Oral Daily  . feeding supplement  1 Container Oral BID BM  . insulin aspart  0-5 Units Subcutaneous QHS  . insulin aspart  0-9 Units Subcutaneous TID WC  . insulin glargine  15 Units Subcutaneous QHS  . metoprolol tartrate  12.5 mg Oral BID  . pantoprazole  40 mg Oral Daily  . QUEtiapine  400 mg Oral QHS  . Tamsulosin HCl  0.4 mg Oral Daily   Continuous Infusions:    Principal Problem:   GI bleeding Active Problems:   Atrial fibrillation   Acute on chronic renal failure   Anemia   Lactic acidosis   Leukocytosis   H/O: CVA (cerebrovascular accident)   Depression   Diabetes   HTN (hypertension)   Erectile dysfunction   Acute post-hemorrhagic anemia   Left upper quadrant abdominal mass    Time spent: 30 minutes    [facility]  [facility] Pager [phone].   If 8PM-8AM, please contact night-coverage at [email or web address], password [facility] 04/09/2012, 4:14 PM  LOS: 4 days

## 2012-04-09 NOTE — Progress Notes (Signed)
Patient refuses to take 400 mg of Seroquel states that he only takes 200 mg because when he does take 400 he sleeps for over 16 hours. Provider on call notified and dosage was changed. Will continue to monitor.   Marcelyn Bruins RN BSN

## 2012-04-10 LAB — GLUCOSE, CAPILLARY
Glucose-Capillary: 202 mg/dL — ABNORMAL HIGH (ref 70–99)
Glucose-Capillary: 198 mg/dL — ABNORMAL HIGH (ref 70–99)

## 2012-04-10 MED ORDER — DOCUSATE SODIUM 100 MG PO CAPS
100.0000 mg | ORAL_CAPSULE | Freq: Two times a day (BID) | ORAL | Status: DC
  Administered 2012-04-10 – 2012-04-14 (×9): 100 mg via ORAL
  Filled 2012-04-10 (×12): qty 1

## 2012-04-10 NOTE — Progress Notes (Signed)
[facility] PROGRESS NOTE  [name] YQM:[id number] DOB: [birth year] DOA: 04/05/2012 PCP: [name], MD  Brief narrative 72 year old male with PMH of CAD, HTN, depression, DM, GERD, atrial fibrillation on aspirin and Plavix, recent admission for penile prosthesis implant admitted to the hospital on 04/05/12 with complaints of generalized weakness, altered mental status, increasing fatigue and black stools. In the ED, hemoglobin 4.1 (was normal 3 weeks ago), creatinine 2.4, white blood cell 25,000. He was admitted for presumed GI bleeding and symptomatic anemia evaluation and management.  Assessment/Plan: 1. Subacute upper GI bleeding: negative EGD on 2/19. No BM since 2/19.[facility] following. Clinically no further bleeding. Source of blood loss not determined. D/W GI on 2/21- no plans for colonoscopy until LUQ mass issue addressed. Black stools 2/21- likely residual malena. No BM's since 2/21.  2. Left upper quadrant mass: Initially seen on renal ultrasound and confirmed with non contrasted CT abdomen. Patient does not complain of abdominal pain. Has decreased appetite for last 3 weeks.Does not seem like hematoma.DD-GIST, Lymphoma, sarcoma. Off aspirin and Plavix for 72 hours at least. Imaging guided biopsy scheduled for 1/24, by IR 3. Acute blood loss anemia: Status post 6 PRBCs and Hb seems to have stabilized. Stable. 4. Atrial fibrillation: in sinus rhythm. Antiplatelets held second GI bleeding. Resumed low dose beta blockers 5. Acute on chronic kidney disease: ARF resolved. D/c IVF. 6. Spingomonas Paucimobilis urinary tract infection, recent urinary tract manipulation/penile prosthesis implantation: urine cultures showed negative on 2/20, but today reported as above. Resumed Rocephin- sensitive. 7. Leukocytosis: ? stress margination. No fevers and improved since admission. Stable. 8. History of CVA: CT head without acute findings. No focal deficits at this time. Antiplatelets  held secondary to GI bleed. 9. History of depression: Continue Seroquel. 10. Uncontrolled type II DM: Adjust insulins and monitor. Reasonably controlled. 11. Hypertension: controlled. Resumed low dose beta blockers. 12. Altered mental status: Possibly secondary to symptomatic anemia and UTI. Resolved. Monitor. 13. Recent penile implant and circumcision: No acute findings. Discussed with Dr. Brunilda Payor on 2/20-sutures are self absorbable and okay to use condom catheter. Outpatient followup. 14. Mild hypernatremia and hyperchloremia: improved. D/C IVF. Follow BMP in a.m.  15. Lactic acidosis: Likely secondary to hypoperfusion from GI bleeding and anemia. Resolved.    Code Status:  DO NOT RESUSCITATE. Family Communication:  Discussed with patient. Disposition Plan: Not medically ready for D/C. Home when ready.   Consultants:  [facility] gastroenterology.  Interventional radiology.  Procedures:   EGD on 2/19  Antibiotics:  Rocephin-DC'd on 2/20  HPI/Subjective:  Denies complaints. Dark BM on 2/21. Asking for laxatives.  Objective: Filed Vitals:   04/09/12 2100 04/09/12 2142 04/10/12 0500 04/10/12 1019  BP: 134/64 134/64 133/70 140/72  Pulse: 45 72 41 71  Temp: 98.2 F (36.8 C)  98.8 F (37.1 C)   TempSrc:      Resp: 16  16   Height:      Weight:      SpO2: 95%  94%     Intake/Output Summary (Last 24 hours) at 04/10/12 1250 Last data filed at 04/10/12 0500  Gross per 24 hour  Intake    360 ml  Output    650 ml  Net   -290 ml   Filed Weights   04/05/12 1830 04/06/12 2330 04/08/12 1100  Weight: 74 kg (163 lb 2.3 oz) 75.1 kg (165 lb 9.1 oz) 79.652 kg (175 lb 9.6 oz)    Exam:   General exam: Comfortable. Sitting up on chair  bathing self.  Respiratory system: Clear. No increased work of breathing.  Cardiovascular system: S1 & S2 heard, RRR. No JVD, murmurs, gallops, clicks or pedal edema. Telemetry shows sinus rhythm.  Gastrointestinal system: Abdomen is nondistended,  soft and nontender. Normal bowel sounds heard. Palpable mass left upper quadrant- firm, non tender, not pulsatile, margins not clearly defined.  Central nervous system: Alert and oriented. No focal neurological deficits.  Extremities: Symmetric 5 x 5 power.   Data Reviewed: Basic Metabolic Panel:  Recent Labs Lab 04/05/12 1244 04/05/12 2352 04/06/12 0837 04/07/12 0535 04/08/12 0430 04/09/12 0532  NA 138  --  142 146* 144 142  K 4.1  --  3.4* 3.5 3.3* 3.7  CL 101  --  109 113* 113* 110  CO2 19  --  21 24 23 24   GLUCOSE 421*  --  242* 113* 109* 128*  BUN 131*  --  98* 51* 22 15  CREATININE 2.41*  --  1.80* 1.47* 1.13 1.12  CALCIUM 8.1*  --  7.6* 7.7* 7.9* 8.1*  MG  --  3.6*  --   --   --   --   PHOS  --  5.5*  --   --   --   --    Liver Function Tests:  Recent Labs Lab 04/05/12 1244  AST 118*  ALT 49  ALKPHOS 71  BILITOT 0.2*  PROT 6.3  ALBUMIN 2.2*   No results found for this basename: LIPASE, AMYLASE,  in the last 168 hours No results found for this basename: AMMONIA,  in the last 168 hours CBC:  Recent Labs Lab 04/05/12 1244  04/07/12 0535 04/07/12 1406 04/07/12 2310 04/08/12 0430 04/09/12 0532  WBC 25.0*  < > 14.8* 16.8* 15.5* 15.6* 14.4*  NEUTROABS 22.9*  --   --   --   --   --   --   HGB 4.1*  < > 7.4* 10.3* 9.9* 9.9* 10.2*  HCT 12.6*  < > 21.6* 29.7* 28.1* 28.2* 30.0*  MCV 90.0  < > 86.7 86.8 86.7 86.0 88.0  PLT 504*  < > 360 342 321 356 331  < > = values in this interval not displayed. Cardiac Enzymes: No results found for this basename: CKTOTAL, CKMB, CKMBINDEX, TROPONINI,  in the last 168 hours BNP (last 3 results) No results found for this basename: PROBNP,  in the last 8760 hours CBG:  Recent Labs Lab 04/09/12 1159 04/09/12 1708 04/09/12 2105 04/10/12 0732 04/10/12 1153  GLUCAP 197* 183* 254* 202* 278*    Recent Results (from the past 240 hour(s))  MRSA PCR SCREENING     Status: None   Collection Time    04/05/12  6:35 PM       Result Value Range Status   MRSA by PCR NEGATIVE  NEGATIVE Final   Comment:            The GeneXpert MRSA Assay (FDA     approved for NASAL specimens     only), is one component of a     comprehensive MRSA colonization     surveillance program. It is not     intended to diagnose MRSA     infection nor to guide or     monitor treatment for     MRSA infections.  URINE CULTURE     Status: None   Collection Time    04/05/12 11:06 PM      Result Value Range Status   Specimen Description  URINE, CLEAN CATCH   Final   Special Requests NONE   Final   Culture  Setup Time 04/05/2012 23:47   Final   Colony Count 60,000 COLONIES/ML   Final   Culture SPHINGOMONAS PAUCIMOBILIS   Final   Report Status 04/08/2012 FINAL   Final   Organism ID, Bacteria SPHINGOMONAS PAUCIMOBILIS   Final     Studies: No results found.   Additional labs:   Scheduled Meds: . atorvastatin  40 mg Oral Daily  . cefTRIAXone (ROCEPHIN)  IV  1 g Intravenous Q24H  . cholecalciferol  5,000 Units Oral Daily  . docusate sodium  100 mg Oral BID  . feeding supplement  1 Container Oral BID BM  . insulin aspart  0-5 Units Subcutaneous QHS  . insulin aspart  0-9 Units Subcutaneous TID WC  . insulin glargine  15 Units Subcutaneous QHS  . metoprolol tartrate  12.5 mg Oral BID  . pantoprazole  40 mg Oral Daily  . QUEtiapine  200 mg Oral QHS  . Tamsulosin HCl  0.4 mg Oral Daily   Continuous Infusions:    Principal Problem:   GI bleeding Active Problems:   Atrial fibrillation   Acute on chronic renal failure   Anemia   Lactic acidosis   Leukocytosis   H/O: CVA (cerebrovascular accident)   Depression   Diabetes   HTN (hypertension)   Erectile dysfunction   Acute post-hemorrhagic anemia   Left upper quadrant abdominal mass    Time spent: 30 minutes    [facility]  [facility] Pager [phone].   If 8PM-8AM, please contact night-coverage at [email or web address], password [facility] 04/10/2012, 12:50 PM  LOS:  5 days

## 2012-04-11 ENCOUNTER — Inpatient Hospital Stay (HOSPITAL_COMMUNITY): Payer: Medicare Other

## 2012-04-11 LAB — BASIC METABOLIC PANEL
CO2: 28 mEq/L (ref 19–32)
Calcium: 8.6 mg/dL (ref 8.4–10.5)
Chloride: 108 mEq/L (ref 96–112)
Creatinine, Ser: 1.09 mg/dL (ref 0.50–1.35)
Glucose, Bld: 171 mg/dL — ABNORMAL HIGH (ref 70–99)

## 2012-04-11 LAB — GLUCOSE, CAPILLARY
Glucose-Capillary: 106 mg/dL — ABNORMAL HIGH (ref 70–99)
Glucose-Capillary: 159 mg/dL — ABNORMAL HIGH (ref 70–99)

## 2012-04-11 LAB — HEPATIC FUNCTION PANEL
ALT: 32 U/L (ref 0–53)
Alkaline Phosphatase: 87 U/L (ref 39–117)
Total Bilirubin: 0.2 mg/dL — ABNORMAL LOW (ref 0.3–1.2)
Total Protein: 5.7 g/dL — ABNORMAL LOW (ref 6.0–8.3)

## 2012-04-11 LAB — CBC
HCT: 30.5 % — ABNORMAL LOW (ref 39.0–52.0)
MCH: 29.2 pg (ref 26.0–34.0)
MCHC: 32.8 g/dL (ref 30.0–36.0)
MCV: 88.9 fL (ref 78.0–100.0)
Platelets: 342 10*3/uL (ref 150–400)
RDW: 14.7 % (ref 11.5–15.5)

## 2012-04-11 LAB — APTT: aPTT: 31 seconds (ref 24–37)

## 2012-04-11 LAB — MAGNESIUM: Magnesium: 1.5 mg/dL (ref 1.5–2.5)

## 2012-04-11 MED ORDER — LABETALOL HCL 5 MG/ML IV SOLN
INTRAVENOUS | Status: AC
  Filled 2012-04-11: qty 4

## 2012-04-11 MED ORDER — HYDROCODONE-ACETAMINOPHEN 5-325 MG PO TABS: 1.0000 | ORAL_TABLET | ORAL | Status: DC | PRN

## 2012-04-11 MED ORDER — MAGNESIUM OXIDE 400 (241.3 MG) MG PO TABS
800.0000 mg | ORAL_TABLET | Freq: Two times a day (BID) | ORAL | Status: AC
  Administered 2012-04-11 – 2012-04-12 (×4): 800 mg via ORAL
  Filled 2012-04-11 (×4): qty 2

## 2012-04-11 MED ORDER — INSULIN GLARGINE 100 UNIT/ML ~~LOC~~ SOLN
20.0000 [IU] | Freq: Every day | SUBCUTANEOUS | Status: DC
  Administered 2012-04-11 – 2012-04-14 (×3): 20 [IU] via SUBCUTANEOUS

## 2012-04-11 MED ORDER — MIDAZOLAM HCL 2 MG/2ML IJ SOLN
INTRAMUSCULAR | Status: AC
  Filled 2012-04-11: qty 4

## 2012-04-11 MED ORDER — MIDAZOLAM HCL 2 MG/2ML IJ SOLN
INTRAMUSCULAR | Status: AC | PRN
  Administered 2012-04-11: 1 mg via INTRAVENOUS

## 2012-04-11 MED ORDER — POLYETHYLENE GLYCOL 3350 17 G PO PACK
17.0000 g | PACK | Freq: Every day | ORAL | Status: DC
  Administered 2012-04-11 – 2012-04-13 (×3): 17 g via ORAL
  Filled 2012-04-11 (×5): qty 1

## 2012-04-11 MED ORDER — FENTANYL CITRATE 0.05 MG/ML IJ SOLN
INTRAMUSCULAR | Status: AC
  Filled 2012-04-11: qty 4

## 2012-04-11 MED ORDER — INSULIN ASPART 100 UNIT/ML ~~LOC~~ SOLN
3.0000 [IU] | Freq: Three times a day (TID) | SUBCUTANEOUS | Status: DC
  Administered 2012-04-11 – 2012-04-13 (×6): 3 [IU] via SUBCUTANEOUS

## 2012-04-11 MED ORDER — FENTANYL CITRATE 0.05 MG/ML IJ SOLN
INTRAMUSCULAR | Status: AC | PRN
  Administered 2012-04-11: 50 ug via INTRAVENOUS

## 2012-04-11 MED ORDER — METOPROLOL TARTRATE 12.5 MG HALF TABLET
12.5000 mg | ORAL_TABLET | Freq: Once | ORAL | Status: AC
  Administered 2012-04-11: 12.5 mg via ORAL
  Filled 2012-04-11: qty 1

## 2012-04-11 NOTE — Progress Notes (Signed)
Agree with PA note.    Signed,  Heath K. McCullough, MD Vascular & Interventional Radiologist  Radiology  

## 2012-04-11 NOTE — Procedures (Signed)
Interventional Radiology Procedure Note  Procedure: Successful [place] core bx of LUQ/gastric mass Complications: None Recommendations: - Bedrest 4 hrs - Pathology pending  Signed,  [name], MD Vascular & Interventional Radiologist [facility] Radiology

## 2012-04-11 NOTE — Progress Notes (Addendum)
[facility] PROGRESS NOTE  [name]:[id number] DOB: 27-Sep-1940 DOA: 04/05/2012 PCP: [name], MD  Brief narrative 72 year old male with PMH of CAD, HTN, depression, DM, GERD, atrial fibrillation on aspirin and Plavix, recent admission for penile prosthesis implant admitted to the hospital on 04/05/12 with complaints of generalized weakness, altered mental status, increasing fatigue and black stools. In the ED, hemoglobin 4.1 (was normal 3 weeks ago), creatinine 2.4, white blood cell 25,000. He was admitted for presumed GI bleeding and symptomatic anemia evaluation and management.  Assessment/Plan: 1. Subacute upper GI bleeding: negative EGD on 2/19. No BM since 2/19.[facility] following. Clinically no further bleeding. Source of blood loss not determined. D/W GI on 2/21- no plans for colonoscopy until LUQ mass issue addressed. Black stools 2/21- likely residual malena. No BM's since 2/21. Add Miralax. 2. Left upper quadrant mass: Initially seen on renal ultrasound and confirmed with non contrasted CT abdomen. Patient does not complain of abdominal pain. Has decreased appetite for last 3 weeks.Does not seem like hematoma.DD-GIST, Lymphoma, sarcoma. Off aspirin and Plavix for 72 hours at least. Imaging guided biopsy scheduled for 1/24, by IR 3. Acute blood loss anemia: Status post 6 PRBCs and Hb seems to have stabilized. Stable. 4. Atrial fibrillation: in sinus rhythm. Antiplatelets held second GI bleeding. Resumed low dose beta blockers. Mg 1.5. PO replete Mg. 5. Acute on chronic kidney disease: ARF resolved. D/c IVF. 6. Spingomonas Paucimobilis urinary tract infection, recent urinary tract manipulation/penile prosthesis implantation: urine cultures showed negative on 2/20, but today reported as above. Resumed Rocephin- sensitive. 7. Leukocytosis: ? stress margination. No fevers and improved since admission. Resolved 8. History of CVA: CT head without acute findings. No focal  deficits at this time. Antiplatelets held secondary to GI bleed. 9. History of depression: Continue Seroquel. 10. Uncontrolled type II DM: Fluctuating CBG's. Increased Lantus, added meal Novolog and continue SSI 11. Hypertension: controlled. Resumed low dose beta blockers. 12. Altered mental status: Possibly secondary to symptomatic anemia and UTI. Resolved. Monitor. 13. Recent penile implant and circumcision: No acute findings. Discussed with Dr. Brunilda Payor on 2/20-sutures are self absorbable and okay to use condom catheter. Outpatient followup. 14. Mild hypernatremia and hyperchloremia: improved. D/C IVF. Follow BMP in a.m.  15. Lactic acidosis: Likely secondary to hypoperfusion from GI bleeding and anemia. Resolved.    Code Status:  DO NOT RESUSCITATE. Family Communication:  Discussed with patient. Disposition Plan: Not medically ready for D/C. Home when ready.   Consultants:  [facility] gastroenterology.  Interventional radiology.  Procedures:   EGD on 2/19  Antibiotics:  Rocephin-DC'd on 2/18 - 2/19 & 2/21 >  HPI/Subjective:  Denies complaints. Dark BM on 2/21 and no BM's since  Objective: Filed Vitals:   04/10/12 2113 04/11/12 0300 04/11/12 0305 04/11/12 0528  BP: 157/67 134/60  152/69  Pulse: 74  76 73  Temp: 98.9 F (37.2 C)   98.3 F (36.8 C)  TempSrc: Oral   Oral  Resp: 20   18  Height:      Weight:      SpO2: 97%   98%    Intake/Output Summary (Last 24 hours) at 04/11/12 0950 Last data filed at 04/11/12 0913  Gross per 24 hour  Intake    240 ml  Output   1525 ml  Net  -1285 ml   Filed Weights   04/05/12 1830 04/06/12 2330 04/08/12 1100  Weight: 74 kg (163 lb 2.3 oz) 75.1 kg (165 lb 9.1 oz) 79.652 kg (175 lb 9.6  oz)    Exam:   General exam: Comfortable.   Respiratory system: Clear. No increased work of breathing.  Cardiovascular system: S1 & S2 heard, RRR. No JVD, murmurs, gallops, clicks or pedal edema. Telemetry shows mostly SB in 50's with  occasional trigemini. Occasional SB in 40's.  Gastrointestinal system: Abdomen is nondistended, soft and nontender. Normal bowel sounds heard. Palpable mass left upper quadrant- firm, non tender, not pulsatile, margins not clearly defined.  Central nervous system: Alert and oriented. No focal neurological deficits.  Extremities: Symmetric 5 x 5 power.   Data Reviewed: Basic Metabolic Panel:  Recent Labs Lab 04/05/12 1244 04/05/12 2352 04/06/12 0837 04/07/12 0535 04/08/12 0430 04/09/12 0532 04/11/12 0700  NA 138  --  142 146* 144 142 142  K 4.1  --  3.4* 3.5 3.3* 3.7 4.3  CL 101  --  109 113* 113* 110 108  CO2 19  --  21 24 23 24 28   GLUCOSE 421*  --  242* 113* 109* 128* 171*  BUN 131*  --  98* 51* 22 15 13   CREATININE 2.41*  --  1.80* 1.47* 1.13 1.12 1.09  CALCIUM 8.1*  --  7.6* 7.7* 7.9* 8.1* 8.6  MG  --  3.6*  --   --   --   --  1.5  PHOS  --  5.5*  --   --   --   --   --    Liver Function Tests:  Recent Labs Lab 04/05/12 1244 04/11/12 0700  AST 118* 28  ALT 49 32  ALKPHOS 71 87  BILITOT 0.2* 0.2*  PROT 6.3 5.7*  ALBUMIN 2.2* 2.0*   No results found for this basename: LIPASE, AMYLASE,  in the last 168 hours No results found for this basename: AMMONIA,  in the last 168 hours CBC:  Recent Labs Lab 04/05/12 1244  04/07/12 1406 04/07/12 2310 04/08/12 0430 04/09/12 0532 04/11/12 0700  WBC 25.0*  < > 16.8* 15.5* 15.6* 14.4* 10.2  NEUTROABS 22.9*  --   --   --   --   --   --   HGB 4.1*  < > 10.3* 9.9* 9.9* 10.2* 10.0*  HCT 12.6*  < > 29.7* 28.1* 28.2* 30.0* 30.5*  MCV 90.0  < > 86.8 86.7 86.0 88.0 88.9  PLT 504*  < > 342 321 356 331 342  < > = values in this interval not displayed. Cardiac Enzymes: No results found for this basename: CKTOTAL, CKMB, CKMBINDEX, TROPONINI,  in the last 168 hours BNP (last 3 results) No results found for this basename: PROBNP,  in the last 8760 hours CBG:  Recent Labs Lab 04/10/12 0732 04/10/12 1153 04/10/12 1713  04/10/12 2111 04/11/12 0705  GLUCAP 202* 278* 198* 326* 183*    Recent Results (from the past 240 hour(s))  MRSA PCR SCREENING     Status: None   Collection Time    04/05/12  6:35 PM      Result Value Range Status   MRSA by PCR NEGATIVE  NEGATIVE Final   Comment:            The GeneXpert MRSA Assay (FDA     approved for NASAL specimens     only), is one component of a     comprehensive MRSA colonization     surveillance program. It is not     intended to diagnose MRSA     infection nor to guide or     monitor  treatment for     MRSA infections.  URINE CULTURE     Status: None   Collection Time    04/05/12 11:06 PM      Result Value Range Status   Specimen Description URINE, CLEAN CATCH   Final   Special Requests NONE   Final   Culture  Setup Time 04/05/2012 23:47   Final   Colony Count 60,000 COLONIES/ML   Final   Culture SPHINGOMONAS PAUCIMOBILIS   Final   Report Status 04/08/2012 FINAL   Final   Organism ID, Bacteria SPHINGOMONAS PAUCIMOBILIS   Final     Studies: No results found.   Additional labs:   Scheduled Meds: . atorvastatin  40 mg Oral Daily  . cefTRIAXone (ROCEPHIN)  IV  1 g Intravenous Q24H  . cholecalciferol  5,000 Units Oral Daily  . docusate sodium  100 mg Oral BID  . feeding supplement  1 Container Oral BID BM  . fentaNYL      . insulin aspart  0-5 Units Subcutaneous QHS  . insulin aspart  0-9 Units Subcutaneous TID WC  . insulin aspart  3 Units Subcutaneous TID WC  . insulin glargine  20 Units Subcutaneous QHS  . metoprolol tartrate  12.5 mg Oral BID  . midazolam      . pantoprazole  40 mg Oral Daily  . QUEtiapine  200 mg Oral QHS  . Tamsulosin HCl  0.4 mg Oral Daily   Continuous Infusions:    Principal Problem:   GI bleeding Active Problems:   Atrial fibrillation   Acute on chronic renal failure   Anemia   Lactic acidosis   Leukocytosis   H/O: CVA (cerebrovascular accident)   Depression   Diabetes   HTN (hypertension)    Erectile dysfunction   Acute post-hemorrhagic anemia   Left upper quadrant abdominal mass    Time spent: 30 minutes    [facility]  [facility] Pager [phone].   If 8PM-8AM, please contact night-coverage at [email or web address], password [facility] 04/11/2012, 9:50 AM  LOS: 6 days

## 2012-04-12 LAB — CBC
Platelets: 332 10*3/uL (ref 150–400)
RDW: 14.4 % (ref 11.5–15.5)
WBC: 9.8 10*3/uL (ref 4.0–10.5)

## 2012-04-12 LAB — GLUCOSE, CAPILLARY: Glucose-Capillary: 155 mg/dL — ABNORMAL HIGH (ref 70–99)

## 2012-04-12 NOTE — Progress Notes (Addendum)
[facility] PROGRESS NOTE  [name] ZOX:[id number] DOB: [birth year] DOA: 04/05/2012 PCP: [name], MD  Brief narrative 72 year old male with PMH of CAD, HTN, depression, DM, GERD, atrial fibrillation on aspirin and Plavix, recent admission for penile prosthesis implant admitted to the hospital on 04/05/12 with complaints of generalized weakness, altered mental status, increasing fatigue and black stools. In the ED, hemoglobin 4.1 (was normal 3 weeks ago), creatinine 2.4, white blood cell 25,000. He was admitted for presumed GI bleeding and symptomatic anemia evaluation and management.  Assessment/Plan: 1. Subacute upper GI bleeding: negative EGD on 2/19. No BM since 2/19.[facility] following. Clinically no further bleeding. Source of blood loss not determined. D/W GI on 2/21- no plans for colonoscopy until LUQ mass issue addressed. No further melena. GI followup appreciated-plan for colonoscopy on 2/27. 2. 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 Imaging guided biopsy done on 1/24, by IR. Discussed with Dr. 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 Final results will be available on 04/13/12. Please call surgery consultation after final pathology results 3. Acute blood loss anemia: Status post 6 PRBCs. Stable. 4. Atrial fibrillation: in sinus rhythm. Antiplatelets held since admission, second GI bleeding. Resumed low dose beta blockers. Mg 1.5. PO replete Mg. Followup magnesium on 2/26. Decision to resume antiplatelets pending-to be held at least until surgical plans are clarified. 2 D Echo 08/11/2009: LVEF 55-65% 5. Acute on chronic kidney disease: ARF resolved after IV fluids. 6. Spingomonas Paucimobilis urinary tract infection, recent urinary tract manipulation/penile prosthesis implantation: Completed 7 days of IV Rocephin-DC. 7. Leukocytosis: ? stress  margination. No fevers and improved since admission. Resolved 8. History of CVA: CT head without acute findings. No focal deficits at this time. Antiplatelets held secondary to GI bleed.  9. History of depression: Continue Seroquel and apparently takes 200 mg at bedtime. 10. Uncontrolled type II DM: Fluctuating CBG's. Increased Lantus, added meal Novolog and continue SSI. Reasonable inpatient control. 11. Hypertension: controlled. Resumed low dose beta blockers. 12. Altered mental status: Possibly secondary to symptomatic anemia and UTI. Resolved.  13. Recent penile implant and circumcision: No acute findings. Discussed with Dr. Brunilda Payor on 2/20-sutures are self absorbable and okay to use condom catheter. Outpatient followup. 14. Mild hypernatremia and hyperchloremia: Resolved 15. Lactic acidosis: Likely secondary to hypoperfusion from GI bleeding and anemia. Resolved.    Code Status:  DO NOT RESUSCITATE. Family Communication:  Discussed with patient and d/w daughter Ms. Sena Slate.. Disposition Plan: Not medically ready for D/C. Home when ready.   Consultants:  [facility] gastroenterology.  Interventional radiology.  Procedures:   EGD on 2/19  Antibiotics:  Rocephin-DC'd on 2/18 - 2/19 & 2/21 >2/25  HPI/Subjective:  Manson Passey stools. Denies any other complaints.  Objective: Filed Vitals:   04/11/12 1300 04/11/12 2157 04/12/12 0632 04/12/12 0959  BP: 147/66 162/91 121/74 153/70  Pulse: 70 72 65 69  Temp: 98.1 F (36.7 C) 98 F (36.7 C) 98.1 F (36.7 C)   TempSrc: Oral Oral Oral   Resp: 16 16 18    Height:      Weight:      SpO2: 99% 100% 98%     Intake/Output Summary (Last 24 hours) at 04/12/12 1343 Last data filed at 04/12/12 1200  Gross per 24 hour  Intake    480 ml  Output    800 ml  Net   -320 ml   Filed Weights   04/05/12 1830  04/06/12 2330 04/08/12 1100  Weight: 74 kg (163 lb 2.3 oz) 75.1 kg (165 lb 9.1 oz) 79.652 kg (175 lb 9.6 oz)    Exam:   General  exam: Comfortable.   Respiratory system: Clear. No increased work of breathing.  Cardiovascular system: S1 & S2 heard, RRR. No JVD, murmurs, gallops, clicks or pedal edema. Telemetry shows mostly SR 60-70's with frequent trigemini.   Gastrointestinal system: Abdomen is nondistended, soft and nontender. Normal bowel sounds heard. Palpable mass left upper quadrant- firm, non tender, not pulsatile, margins not clearly defined.  Central nervous system: Alert and oriented. No focal neurological deficits.  Extremities: Symmetric 5 x 5 power.   Data Reviewed: Basic Metabolic Panel:  Recent Labs Lab 04/05/12 2352 04/06/12 0837 04/07/12 0535 04/08/12 0430 04/09/12 0532 04/11/12 0700  NA  --  142 146* 144 142 142  K  --  3.4* 3.5 3.3* 3.7 4.3  CL  --  109 113* 113* 110 108  CO2  --  21 24 23 24 28   GLUCOSE  --  242* 113* 109* 128* 171*  BUN  --  98* 51* 22 15 13   CREATININE  --  1.80* 1.47* 1.13 1.12 1.09  CALCIUM  --  7.6* 7.7* 7.9* 8.1* 8.6  MG 3.6*  --   --   --   --  1.5  PHOS 5.5*  --   --   --   --   --    Liver Function Tests:  Recent Labs Lab 04/11/12 0700  AST 28  ALT 32  ALKPHOS 87  BILITOT 0.2*  PROT 5.7*  ALBUMIN 2.0*   No results found for this basename: LIPASE, AMYLASE,  in the last 168 hours No results found for this basename: AMMONIA,  in the last 168 hours CBC:  Recent Labs Lab 04/07/12 2310 04/08/12 0430 04/09/12 0532 04/11/12 0700 04/12/12 0500  WBC 15.5* 15.6* 14.4* 10.2 9.8  HGB 9.9* 9.9* 10.2* 10.0* 10.0*  HCT 28.1* 28.2* 30.0* 30.5* 29.7*  MCV 86.7 86.0 88.0 88.9 87.6  PLT 321 356 331 342 332   Cardiac Enzymes: No results found for this basename: CKTOTAL, CKMB, CKMBINDEX, TROPONINI,  in the last 168 hours BNP (last 3 results) No results found for this basename: PROBNP,  in the last 8760 hours CBG:  Recent Labs Lab 04/11/12 1151 04/11/12 1650 04/11/12 2154 04/12/12 0741 04/12/12 1150  GLUCAP 106* 159* 149* 155* 128*    Recent  Results (from the past 240 hour(s))  MRSA PCR SCREENING     Status: None   Collection Time    04/05/12  6:35 PM      Result Value Range Status   MRSA by PCR NEGATIVE  NEGATIVE Final   Comment:            The GeneXpert MRSA Assay (FDA     approved for NASAL specimens     only), is one component of a     comprehensive MRSA colonization     surveillance program. It is not     intended to diagnose MRSA     infection nor to guide or     monitor treatment for     MRSA infections.  URINE CULTURE     Status: None   Collection Time    04/05/12 11:06 PM      Result Value Range Status   Specimen Description URINE, CLEAN CATCH   Final   Special Requests NONE   Final   Culture  Setup Time 04/05/2012 23:47   Final   Colony Count 60,000 COLONIES/ML   Final   Culture SPHINGOMONAS PAUCIMOBILIS   Final   Report Status 04/08/2012 FINAL   Final   Organism ID, Bacteria SPHINGOMONAS PAUCIMOBILIS   Final     Studies: US Biopsy  04/11/2012  *RADIOLOGY REPORT*  ULTRASOUND-GUIDED CORE BIOPSY  Date: 04/11/2012  Clinical History: 72 year old male with areas of recently diagnosed left upper quadrant mass appears pedunculated and exophytic from the greater curvature of the stomach.  He presents for ultrasound- guided biopsy to facilitate tissue diagnosis.  Procedures Performed: 1. Ultrasound-guided core biopsy  Interventional Radiologist:  [name], MD  Sedation: Moderate (conscious) sedation was used.  One mg Versed, 50 mcg Fentanyl were administered intravenously.  The patient's vital signs were monitored continuously by radiology nursing throughout the procedure.  Sedation Time: 10 minutes  Fluoroscopy time: None  Contrast volume: None  PROCEDURE/FINDINGS:   Informed consent was obtained from the patient following explanation of the procedure, risks, benefits and alternatives. The patient understands, agrees and consents for the procedure. All questions were addressed. A time out was performed.   Maximal barrier sterile technique utilized including caps, mask, sterile gowns, sterile gloves, large sterile drape, hand hygiene, and betadine skin prep.  The left upper quadrant was interrogated with ultrasound.  There is a large complex cystic and solid mass in the left upper quadrant which is inseparable from the stomach.  The internal cystic area likely represent areas of internal necrosis. A suitable skin entry site was selected marked.  Local anesthesia was obtained by infiltration of 1% lidocaine.  Under direct sonographic guidance, a 17 gauge trocar needle was advanced through the abdominal wall and into the mass.  Several 18-gauge core biopsies were then coaxially obtained from the solid portion of the mass.  Biopsies were placed in formalin.  As the 17 gauge outer cannula was removed, the biopsy tract was embolized with a Gelfoam slurry.  Post biopsy imaging demonstrated no active hemorrhage or perilesional hematoma.  The patient tolerated the procedure well.  No immediate complication.  IMPRESSION:  Technically successful ultrasound guided core biopsy of left upper quadrant mass lesion which is likely arising from the gastric wall.  Signed,  [name], MD Vascular & Interventional Radiologist [facility] Radiology   Original Report Authenticated By: [name], [name]      Additional labs:   Scheduled Meds: . atorvastatin  40 mg Oral Daily  . cefTRIAXone (ROCEPHIN)  IV  1 g Intravenous Q24H  . cholecalciferol  5,000 Units Oral Daily  . docusate sodium  100 mg Oral BID  . feeding supplement  1 Container Oral BID BM  . insulin aspart  0-5 Units Subcutaneous QHS  . insulin aspart  0-9 Units Subcutaneous TID WC  . insulin aspart  3 Units Subcutaneous TID WC  . insulin glargine  20 Units Subcutaneous QHS  . magnesium oxide  800 mg Oral BID  . metoprolol tartrate  12.5 mg Oral BID  . pantoprazole  40 mg Oral Daily  . polyethylene glycol  17 g Oral Daily  . QUEtiapine  200 mg  Oral QHS  . tamsulosin  0.4 mg Oral Daily   Continuous Infusions:    Principal Problem:   GI bleeding Active Problems:   Atrial fibrillation   Acute on chronic renal failure   Anemia   Lactic acidosis   Leukocytosis   H/O: CVA (cerebrovascular accident)   Depression   Diabetes  HTN (hypertension)   Erectile dysfunction   Acute post-hemorrhagic anemia   Left upper quadrant abdominal mass    Time spent: 40 minutes    [facility]  [facility] Pager [phone].   If 8PM-8AM, please contact night-coverage at [email or web address], password [facility] 04/12/2012, 1:43 PM  LOS: 7 days

## 2012-04-12 NOTE — Progress Notes (Signed)
[facility] Progress Note  Subjective: And no new complain. Apprehensive about his diagnosis.  Objective: Vital signs in last 24 hours: Temp:  [98 F (36.7 [name])-98.1 F (36.7 [name])] 98.1 F (36.7 [name]) (02/25 1610) Pulse Rate:  [50-72] 69 (02/25 0959) Resp:  [16-18] 18 (02/25 0632) BP: (121-162)/(61-91) 153/70 mmHg (02/25 0959) SpO2:  [98 %-100 %] 98 % (02/25 9604) Weight change:    PE: Unchanged  Lab Results: Results for orders placed during the hospital encounter of 04/05/12 (from the past 24 hour(s))  GLUCOSE, CAPILLARY     Status: Abnormal   Collection Time    04/11/12 11:51 AM      Result Value Range   Glucose-Capillary 106 (*) 70 - 99 mg/dL   Comment 1 Notify RN    GLUCOSE, CAPILLARY     Status: Abnormal   Collection Time    04/11/12  4:50 PM      Result Value Range   Glucose-Capillary 159 (*) 70 - 99 mg/dL   Comment 1 Notify RN    GLUCOSE, CAPILLARY     Status: Abnormal   Collection Time    04/11/12  9:54 PM      Result Value Range   Glucose-Capillary 149 (*) 70 - 99 mg/dL   Comment 1 Notify RN    CBC     Status: Abnormal   Collection Time    04/12/12  5:00 AM      Result Value Range   WBC 9.8  4.0 - 10.5 K/uL   RBC 3.39 (*) 4.22 - 5.81 MIL/uL   Hemoglobin 10.0 (*) 13.0 - 17.0 g/dL   HCT 54.0 (*) 98.1 - 19.1 %   MCV 87.6  78.0 - 100.0 fL   MCH 29.5  26.0 - 34.0 pg   MCHC 33.7  30.0 - 36.0 g/dL   RDW 47.8  29.5 - 62.1 %   Platelets 332  150 - 400 K/uL  GLUCOSE, CAPILLARY     Status: Abnormal   Collection Time    04/12/12  7:41 AM      Result Value Range   Glucose-Capillary 155 (*) 70 - 99 mg/dL   Comment 1 Notify RN      Studies/Results: US Biopsy  04/11/2012  *RADIOLOGY REPORT*  ULTRASOUND-GUIDED CORE BIOPSY  Date: 04/11/2012  Clinical History: 72 year old male with areas of recently diagnosed left upper quadrant mass appears pedunculated and exophytic from the greater curvature of the stomach.  He presents for ultrasound- guided biopsy to  facilitate tissue diagnosis.  Procedures Performed: 1. Ultrasound-guided core biopsy  Interventional Radiologist:  [name], MD  Sedation: Moderate (conscious) sedation was used.  One mg Versed, 50 mcg Fentanyl were administered intravenously.  The patient's vital signs were monitored continuously by radiology nursing throughout the procedure.  Sedation Time: 10 minutes  Fluoroscopy time: None  Contrast volume: None  PROCEDURE/FINDINGS:   Informed consent was obtained from the patient following explanation of the procedure, risks, benefits and alternatives. The patient understands, agrees and consents for the procedure. All questions were addressed. A time out was performed.  Maximal barrier sterile technique utilized including caps, mask, sterile gowns, sterile gloves, large sterile drape, hand hygiene, and betadine skin prep.  The left upper quadrant was interrogated with ultrasound.  There is a large complex cystic and solid mass in the left upper quadrant which is inseparable from the stomach.  The internal cystic area likely represent areas of internal necrosis. A suitable skin entry site was selected marked.  Local anesthesia was obtained by infiltration of 1% lidocaine.  Under direct sonographic guidance, a 17 gauge trocar needle was advanced through the abdominal wall and into the mass.  Several 18-gauge core biopsies were then coaxially obtained from the solid portion of the mass.  Biopsies were placed in formalin.  As the 17 gauge outer cannula was removed, the biopsy tract was embolized with a Gelfoam slurry.  Post biopsy imaging demonstrated no active hemorrhage or perilesional hematoma.  The patient tolerated the procedure well.  No immediate complication.  IMPRESSION:  Technically successful ultrasound guided core biopsy of left upper quadrant mass lesion which is likely arising from the gastric wall.  Signed,  [name], MD Vascular & Interventional Radiologist [facility]  Radiology   Original Report Authenticated By: [name], [name]       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 Anemia and heme positive stools with significant hemoglobin drop not explained by location of primary tumor appeared  Plan: Patient states she's had a colonoscopy about a years and within the last 5 years and this is indicated due to the heme positive stools and anemia especially before probable anticipated surgery. He did not want to do it tomorrow so will prep him tomorrow to do it today after.    [name],[name] 04/12/2012, 11:50 AM

## 2012-04-12 NOTE — Care Management Note (Unsigned)
    Page 1 of 1   04/14/2012     3:44:48 PM   CARE MANAGEMENT NOTE 04/14/2012  Patient:  [name], [name]   Account Number:  [id number]  Date Initiated:  04/12/2012  Documentation initiated by:  GRAVES-BIGELOW,Ahri Olson  Subjective/Objective Assessment:   Pt admitted with AMS and GI bleed s/p 6 PRBC transfusions. Pt with a LUQ pass. Imaging guided biopsy for 03-11-12. Continous Iv ABX.     Action/Plan:   CM did speak to pt and he is from home alone.  He may benefit form HH RN for disease management. Pt would benefit from PT/OT consult.   Anticipated DC Date:  04/13/2012   Anticipated DC Plan:  HOME W HOME HEALTH SERVICES      DC Planning Services  CM consult      [facility] Choice  HOME HEALTH   Choice offered to / List presented to:             Status of service:  In process, will continue to follow Medicare Important Message given?   (If response is "NO", the following Medicare IM given date fields will be blank) Date Medicare IM given:   Date Additional Medicare IM given:    Discharge Disposition:    Per UR Regulation:  Reviewed for med. necessity/level of care/duration of stay  If discussed at Long Length of Stay Meetings, dates discussed:   04/12/2012  04/14/2012    Comments:  04-14-12 [address], [place] [phone] CM didplace an order for CIR to do a pre screen on pt. PT worked with pt and plans for possible CIR vs SNF. CM did touch Base with CIR Marylin. She will look at PT notes to see if pt is a candidate. CM also touched base with CSW for possible SNF.  CM will continue to  monitor for disposition needs.

## 2012-04-13 DIAGNOSIS — Z8673 Personal history of transient ischemic attack (TIA), and cerebral infarction without residual deficits: Secondary | ICD-10-CM

## 2012-04-13 LAB — GLUCOSE, CAPILLARY
Glucose-Capillary: 176 mg/dL — ABNORMAL HIGH (ref 70–99)
Glucose-Capillary: 96 mg/dL (ref 70–99)
Glucose-Capillary: 83 mg/dL (ref 70–99)

## 2012-04-13 MED ORDER — PEG 3350-KCL-NA BICARB-NACL 420 G PO SOLR
4000.0000 mL | Freq: Once | ORAL | Status: AC
  Administered 2012-04-13: 4000 mL via ORAL
  Filled 2012-04-13: qty 4000

## 2012-04-13 MED ORDER — SODIUM CHLORIDE 0.9 % IV SOLN
INTRAVENOUS | Status: DC
Start: 2012-04-13 — End: 2012-04-15
  Administered 2012-04-13: 20 mL/h via INTRAVENOUS
  Administered 2012-04-15: 15:00:00 via INTRAVENOUS

## 2012-04-13 MED ORDER — PEG 3350-KCL-NA BICARB-NACL 420 G PO SOLR
4000.0000 mL | Freq: Once | ORAL | Status: DC
  Filled 2012-04-13: qty 4000

## 2012-04-13 MED ORDER — SODIUM CHLORIDE 0.9 % IV SOLN: INTRAVENOUS | Status: DC

## 2012-04-13 MED ORDER — BOOST / RESOURCE BREEZE PO LIQD: 1.0000 | Freq: Three times a day (TID) | ORAL | Status: DC

## 2012-04-13 MED ORDER — PEG 3350-KCL-NA BICARB-NACL 420 G PO SOLR: 4000.0000 mL | Freq: Once | ORAL | Status: DC

## 2012-04-13 NOTE — Progress Notes (Signed)
Spoke to Dr. Isidoro Donning she stated to DC order for meal coverage insulin

## 2012-04-13 NOTE — Progress Notes (Addendum)
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  For colonoscopy tomorrow.  PO intake 100% per flowsheet records.  Drinking his Resource Breeze supplements.  Height: Ht Readings from Last 1 Encounters:  04/08/12 5\' 6"  (1.676 m)    Weight Status:   Wt Readings from Last 1 Encounters:  04/08/12 175 lb 9.6 oz (79.652 kg)    Re-estimated needs:  Kcal: 1700-1850 Protein: 75-85 gm Fluid: 1.7-1.8 L  Skin: Intact  Diet Order: Clear Liquid   Intake/Output Summary (Last 24 hours) at 04/13/12 1202 Last data filed at 04/13/12 0819  Gross per 24 hour  Intake    720 ml  Output    900 ml  Net   -180 ml    Last BM: 2/25  Labs:   Recent Labs Lab 04/08/12 0430 04/09/12 0532 04/11/12 0700 04/13/12 0624  NA 144 142 142  --   K 3.3* 3.7 4.3  --   CL 113* 110 108  --   CO2 23 24 28   --   BUN 22 15 13   --   CREATININE 1.13 1.12 1.09  --   CALCIUM 7.9* 8.1* 8.6  --   MG  --   --  1.5 2.0  GLUCOSE 109* 128* 171*  --     Phosphorus  Date Value Range Status  04/05/2012 5.5* 2.3 - 4.6 mg/dL Final    CBG (last 3)   Recent Labs  04/12/12 2111 04/13/12 0746 04/13/12 1119  GLUCAP 195* 176* 96    Scheduled Meds: . atorvastatin  40 mg Oral Daily  . cholecalciferol  5,000 Units Oral Daily  . docusate sodium  100 mg Oral BID  . feeding supplement  1  Container Oral BID BM  . insulin aspart  0-5 Units Subcutaneous QHS  . insulin aspart  0-9 Units Subcutaneous TID WC  . insulin aspart  3 Units Subcutaneous TID WC  . insulin glargine  20 Units Subcutaneous QHS  . metoprolol tartrate  12.5 mg Oral BID  . pantoprazole  40 mg Oral Daily  . polyethylene glycol  17 g Oral Daily  . polyethylene glycol-electrolytes  4,000 mL Oral Once  . QUEtiapine  200 mg Oral QHS  . tamsulosin  0.4 mg Oral Daily  . [DISCONTINUED] polyethylene glycol-electrolytes  4,000 mL Oral Once    Continuous Infusions: . sodium chloride      Maureen Chatters, RD, LDN Pager #: [phone] After-Hours Pager #: [phone]

## 2012-04-13 NOTE — Progress Notes (Signed)
Patient ID: [name]  male  ZDG:[id number]    DOB: 1940/07/12    DOA: 04/05/2012  PCP: [name], MD  Assessment/Plan:  1. Subacute upper GI bleeding: negative EGD on 2/19. - [facility] following. Clinically no further bleeding. - C-scope tomorrow  2. Left upper quadrant mass: Initially seen on renal ultrasound and confirmed with non contrasted CT abdomen, no  abd pain, dec appetite.DDx GIST, Lymphoma, sarcoma.  - [place] core Bx done on 2/24, final path still pending. Will discuss with surgery and oncology pending the pathology results. 3. Acute blood loss anemia: S/p 6 PRBCs, Hb stable 4. Atrial fibrillation: in sinus rhythm. Antiplatelets held second GI bleeding. Resumed low dose beta blockers. 5. Acute on CKD: ARF resolved.  6. Spingomonas Paucimobilis UTI: recent urinary tract manipulation/penile prosthesis implantation: completed 7 days of rocephin 7. Leukocytosis: ? stress margination. resolved. 8. History of CVA: CT head without acute findings. No focal deficits at this time. Antiplatelets held secondary to GI bleed. 9. History of depression: Continue Seroquel. 10. Uncontrolled type II DM:  Fairly controlled. 11. Hypertension: controlled. Resumed low dose beta blockers. 12. Altered mental status: Possibly secondary to symptomatic anemia and UTI, resolved 13. Recent penile implant and circumcision: No acute findings. Discussed by Dr Waymon Amato with Dr. Brunilda Payor on 2/20-sutures are self absorbable and okay to use condom catheter. Outpatient followup. 14. Mild hypernatremia and hyperchloremia: improved. 15. Lactic acidosis: Likely secondary to hypoperfusion from GI bleeding and anemia. Resolved.  DVT Prophylaxis: SCD's  Code Status: DNR  Disposition:    Subjective: Tolerating clears, otherwise denies any specific complaints at this time, no chest, SOB, fevers or chills  Objective: Weight change:   Intake/Output Summary (Last 24 hours) at 04/13/12 1208 Last data filed at  04/13/12 0819  Gross per 24 hour  Intake    720 ml  Output    900 ml  Net   -180 ml   Blood pressure 163/74, pulse 79, temperature 98 F (36.7 C), temperature source Oral, resp. rate 16, height 5\' 6"  (1.676 m), weight 79.652 kg (175 lb 9.6 oz), SpO2 97.00%.  Physical Exam: General: Alert and awake, oriented x3, not in any acute distress. CVS: S1-S2 clear, no murmur rubs or gallops Chest: clear to auscultation bilaterally, no wheezing, rales or rhonchi Abdomen: soft nontender, nondistended, normal bowel sounds,mass in LUQ Extremities: no cyanosis, clubbing or edema noted bilaterally Neuro: Cranial nerves II-XII intact, no focal neurological deficits  Lab Results: Basic Metabolic Panel:  Recent Labs Lab 04/09/12 0532  04/11/12 0700 04/13/12 0624  NA 142  --  142  --   K 3.7  --  4.3  --   CL 110  --  108  --   CO2 24  --  28  --   GLUCOSE 128*  --  171*  --   BUN 15  --  13  --   CREATININE 1.12  --  1.09  --   CALCIUM 8.1*  --  8.6  --   MG  --   < > 1.5 2.0  < > = values in this interval not displayed. Liver Function Tests:  Recent Labs Lab 04/11/12 0700  AST 28  ALT 32  ALKPHOS 87  BILITOT 0.2*  PROT 5.7*  ALBUMIN 2.0*   No results found for this basename: LIPASE, AMYLASE,  in the last 168 hours No results found for this basename: AMMONIA,  in the last 168 hours CBC:  Recent Labs Lab 04/11/12 0700 04/12/12 0500  WBC 10.2 9.8  HGB 10.0* 10.0*  HCT 30.5* 29.7*  MCV 88.9 87.6  PLT 342 332   Cardiac Enzymes: No results found for this basename: CKTOTAL, CKMB, CKMBINDEX, TROPONINI,  in the last 168 hours BNP: No components found with this basename: POCBNP,  CBG:  Recent Labs Lab 04/12/12 1150 04/12/12 1708 04/12/12 2111 04/13/12 0746 04/13/12 1119  GLUCAP 128* 173* 195* 176* 96     Micro Results: Recent Results (from the past 240 hour(s))  MRSA PCR SCREENING     Status: None   Collection Time    04/05/12  6:35 PM      Result Value Range  Status   MRSA by PCR NEGATIVE  NEGATIVE Final   Comment:            The GeneXpert MRSA Assay (FDA     approved for NASAL specimens     only), is one component of a     comprehensive MRSA colonization     surveillance program. It is not     intended to diagnose MRSA     infection nor to guide or     monitor treatment for     MRSA infections.  URINE CULTURE     Status: None   Collection Time    04/05/12 11:06 PM      Result Value Range Status   Specimen Description URINE, CLEAN CATCH   Final   Special Requests NONE   Final   Culture  Setup Time 04/05/2012 23:47   Final   Colony Count 60,000 COLONIES/ML   Final   Culture SPHINGOMONAS PAUCIMOBILIS   Final   Report Status 04/08/2012 FINAL   Final   Organism ID, Bacteria SPHINGOMONAS PAUCIMOBILIS   Final    Studies/Results: Ct Abdomen Pelvis Wo Contrast  04/06/2012  *RADIOLOGY REPORT*  Clinical Data: Left upper quadrant mass on ultrasound  CT ABDOMEN AND PELVIS WITHOUT CONTRAST  Technique:  Multidetector CT imaging of the abdomen and pelvis was performed following the standard protocol without intravenous contrast.  Comparison: Ultrasound of the kidneys of 04/05/2012  Findings: The lung bases are clear.  There is moderate cardiomegaly present.  No pericardial effusion is seen.  The liver is unremarkable in the unenhanced state.  Faintly calcified gallstones are noted within the somewhat distended gallbladder and the gallbladder wall is slightly thickened as well and clinical correlation is recommended to exclude cholecystitis.  The pancreas appears somewhat atrophic and the pancreatic duct is not dilated. The adrenal glands are both slightly nodular most consistent with adrenal adenomas.  Metastatic involvement would be difficult to exclude.  The spleen is normal in size.  There is a large rounded soft tissue mass within the left upper quadrant as noted by ultrasound which appears to be intimately associated with the greater curvature of the  stomach.  This mass measures approximately 11.1 x 12.7 x 13.2 cm and is somewhat inhomogeneous in attenuation on this unenhanced study.  The primary considerations are that of a gastrointestinal stromal tumor (G I S T tumor), gastric carcinoma or massive adenopathy as with lymphoma. Biopsy could be performed in view of the location of this mass.  The kidneys are unremarkable in the unenhanced state.  No renal calculi are noted.  No adenopathy is seen.   The abdominal aorta is normal in caliber.  The urinary bladder is unremarkable.  A reservoir for penile prosthesis is noted just anterior to the urinary bladder.  The prostate is slightly prominent.  No abnormality  of the colon is seen.  The appendix is unremarkable.  There are degenerative changes diffusely throughout the facet joints of the lower lumbar spine.  IMPRESSION:  1.  Large somewhat inhomogeneous rounded soft tissue mass in the left upper quadrant appears to be intimately associated with the greater curvature of the stomach and may represent a GIST tumor versus gastric carcinoma  carcinoma  carcinoma or adenopathy as with lymphoma.  Biopsy could be performed. 2.  Faintly calcified gallstones within a somewhat thick-walled gallbladder.  Rule out acute cholecystitis. 3.  Nodular adrenal glands.  Possibly adrenal adenomas but cannot exclude metastatic involvement.   Original Report Authenticated By: Dwyane Dee, [name]    Dg Chest 2 View  04/05/2012  *RADIOLOGY REPORT*  Clinical Data: Altered mental status.  CHEST - 2 VIEW  Comparison: 03/03/2012.  Findings: The cardiac silhouette, mediastinal and hilar contours are stable.  Stable surgical changes from bypass surgery.  No acute pulmonary findings.  No pleural effusion or pneumothorax.  The bony thorax is intact.  IMPRESSION: No acute cardiopulmonary findings.   Original Report Authenticated By: Rudie Meyer, [name]    Ct Head Wo Contrast  04/05/2012  *RADIOLOGY REPORT*  Clinical Data: Weakness and altered  mental status.  CT HEAD WITHOUT CONTRAST  Technique:  Contiguous axial images were obtained from the base of the skull through the vertex without contrast.  Comparison: 08/22/2010.  Findings: Stable age related cerebral atrophy, ventriculomegaly and periventricular white matter disease.  No extra-axial fluid collections.  No CT findings for hemispheric infarction and/or intracranial hemorrhage.  No mass lesions.  The brainstem and cerebellum are normal and stable. There is a remote right cerebellar hemispheric infarction.  The bony structures are intact.  No skull fracture.  Mild mucoperiosteal thickening in the left half of the sphenoid sinus and scattered ethmoid air cells.  The mastoid air cells are clear.  IMPRESSION:  1. Stable age related cerebral atrophy, ventriculomegaly and periventricular white matter disease. 2.  Remote right-sided cerebellar infarction. 3.  No acute intracranial findings or mass lesion.   Original Report Authenticated By: Rudie Meyer, [name]    US Renal  04/05/2012  *RADIOLOGY REPORT*  Clinical Data: Acute renal failure  RENAL/URINARY TRACT ULTRASOUND COMPLETE  Comparison:  03/10/2006 CT  Findings:  Right Kidney:  Measures 10.8 cm.  No hydronephrosis.  1.5 x 1.3 cm interpolar hypoechoic lesion is incompletely characterized.  Left Kidney:  Measures 10.5 cm.  No hydronephrosis or focal abnormality.  Bladder:  Not visualized.  The patient has a Foley catheter in place.  Incidental note is made of a mixed cystic and solid mass in the left upper quadrant.  IMPRESSION: No hydronephrosis.  Incompletely characterized 1.5 cm hypoechoic right renal lesion, favored to reflect a mildly complex cyst.  Incompletely characterized 15 cm left upper quadrant mass.  Contrast enhanced CT or MRI recommended to further evaluate the above findings.   Original Report Authenticated By: Jearld Lesch, [name]    US Biopsy  04/11/2012  *RADIOLOGY REPORT*  ULTRASOUND-GUIDED CORE BIOPSY  Date: 04/11/2012   Clinical History: 72 year old male with areas of recently diagnosed left upper quadrant mass appears pedunculated and exophytic from the greater curvature of the stomach.  He presents for ultrasound- guided biopsy to facilitate tissue diagnosis.  Procedures Performed: 1. Ultrasound-guided core biopsy  Interventional Radiologist:  [name], MD  Sedation: Moderate (conscious) sedation was used.  One mg Versed, 50 mcg Fentanyl were administered intravenously.  The patient's vital signs were monitored continuously  by radiology nursing throughout the procedure.  Sedation Time: 10 minutes  Fluoroscopy time: None  Contrast volume: None  PROCEDURE/FINDINGS:   Informed consent was obtained from the patient following explanation of the procedure, risks, benefits and alternatives. The patient understands, agrees and consents for the procedure. All questions were addressed. A time out was performed.  Maximal barrier sterile technique utilized including caps, mask, sterile gowns, sterile gloves, large sterile drape, hand hygiene, and betadine skin prep.  The left upper quadrant was interrogated with ultrasound.  There is a large complex cystic and solid mass in the left upper quadrant which is inseparable from the stomach.  The internal cystic area likely represent areas of internal necrosis. A suitable skin entry site was selected marked.  Local anesthesia was obtained by infiltration of 1% lidocaine.  Under direct sonographic guidance, a 17 gauge trocar needle was advanced through the abdominal wall and into the mass.  Several 18-gauge core biopsies were then coaxially obtained from the solid portion of the mass.  Biopsies were placed in formalin.  As the 17 gauge outer cannula was removed, the biopsy tract was embolized with a Gelfoam slurry.  Post biopsy imaging demonstrated no active hemorrhage or perilesional hematoma.  The patient tolerated the procedure well.  No immediate complication.  IMPRESSION:   Technically successful ultrasound guided core biopsy of left upper quadrant mass lesion which is likely arising from the gastric wall.  Signed,  [name], MD Vascular & Interventional Radiologist [facility]   Original Report Authenticated By: [name], [name]     Medications: Scheduled Meds: . atorvastatin  40 mg Oral Daily  . cholecalciferol  5,000 Units Oral Daily  . docusate sodium  100 mg Oral BID  . feeding supplement  1 Container Oral BID BM  . insulin aspart  0-5 Units Subcutaneous QHS  . insulin aspart  0-9 Units Subcutaneous TID WC  . insulin aspart  3 Units Subcutaneous TID WC  . insulin glargine  20 Units Subcutaneous QHS  . metoprolol tartrate  12.5 mg Oral BID  . pantoprazole  40 mg Oral Daily  . polyethylene glycol  17 g Oral Daily  . polyethylene glycol-electrolytes  4,000 mL Oral Once  . QUEtiapine  200 mg Oral QHS  . tamsulosin  0.4 mg Oral Daily  . [DISCONTINUED] polyethylene glycol-electrolytes  4,000 mL Oral Once      LOS: 8 days   Briyan Kleven [name] [facility] 04/13/2012, 12:08 PM Pager: [phone]  If 7PM-7AM, please contact night-coverage [email or web address] Password [email or web address]

## 2012-04-13 NOTE — Progress Notes (Signed)
[facility] Progress Note  Subjective: Sleeping well, no new complaints  Objective: Vital signs in last 24 hours: Temp:  [98 F (36.7 [name])-98.7 F (37.1 [name])] 98 F (36.7 [name]) (02/26 0500) Pulse Rate:  [47-72] 47 (02/26 0500) Resp:  [16] 16 (02/26 0500) BP: (135-164)/(62-70) 164/67 mmHg (02/26 0500) SpO2:  [97 %-99 %] 97 % (02/26 0500) Weight change:    PE: Unchanged  Lab Results: Results for orders placed during the hospital encounter of 04/05/12 (from the past 24 hour(s))  GLUCOSE, CAPILLARY     Status: Abnormal   Collection Time    04/12/12 11:50 AM      Result Value Range   Glucose-Capillary 128 (*) 70 - 99 mg/dL  GLUCOSE, CAPILLARY     Status: Abnormal   Collection Time    04/12/12  5:08 PM      Result Value Range   Glucose-Capillary 173 (*) 70 - 99 mg/dL  GLUCOSE, CAPILLARY     Status: Abnormal   Collection Time    04/12/12  9:11 PM      Result Value Range   Glucose-Capillary 195 (*) 70 - 99 mg/dL   Comment 1 Notify RN    MAGNESIUM     Status: None   Collection Time    04/13/12  6:24 AM      Result Value Range   Magnesium 2.0  1.5 - 2.5 mg/dL  GLUCOSE, CAPILLARY     Status: Abnormal   Collection Time    04/13/12  7:46 AM      Result Value Range   Glucose-Capillary 176 (*) 70 - 99 mg/dL   Comment 1 Notify RN      Studies/Results: US Biopsy  04/11/2012  *RADIOLOGY REPORT*  ULTRASOUND-GUIDED CORE BIOPSY  Date: 04/11/2012  Clinical History: 72 year old male with areas of recently diagnosed left upper quadrant mass appears pedunculated and exophytic from the greater curvature of the stomach.  He presents for ultrasound- guided biopsy to facilitate tissue diagnosis.  Procedures Performed: 1. Ultrasound-guided core biopsy  Interventional Radiologist:  [name] Big, MD  Sedation: Moderate (conscious) sedation was used.  One mg Versed, 50 mcg Fentanyl were administered intravenously.  The patient's vital signs were monitored continuously by radiology  nursing throughout the procedure.  Sedation Time: 10 minutes  Fluoroscopy time: None  Contrast volume: None  PROCEDURE/FINDINGS:   Informed consent was obtained from the patient following explanation of the procedure, risks, benefits and alternatives. The patient understands, agrees and consents for the procedure. All questions were addressed. A time out was performed.  Maximal barrier sterile technique utilized including caps, mask, sterile gowns, sterile gloves, large sterile drape, hand hygiene, and betadine skin prep.  The left upper quadrant was interrogated with ultrasound.  There is a large complex cystic and solid mass in the left upper quadrant which is inseparable from the stomach.  The internal cystic area likely represent areas of internal necrosis. A suitable skin entry site was selected marked.  Local anesthesia was obtained by infiltration of 1% lidocaine.  Under direct sonographic guidance, a 17 gauge trocar needle was advanced through the abdominal wall and into the mass.  Several 18-gauge core biopsies were then coaxially obtained from the solid portion of the mass.  Biopsies were placed in formalin.  As the 17 gauge outer cannula was removed, the biopsy tract was embolized with a Gelfoam slurry.  Post biopsy imaging demonstrated no active hemorrhage or perilesional hematoma.  The patient tolerated the procedure well.  No immediate  complication.  IMPRESSION:  Technically successful ultrasound guided core biopsy of left upper quadrant mass lesion which is likely arising from the gastric wall.  Signed,  [name] Big, MD Vascular & Interventional Radiologist [facility]   Original Report Authenticated By: [name], [name]       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    [name],[name] 04/13/2012, 9:40 AM

## 2012-04-14 ENCOUNTER — Encounter (HOSPITAL_COMMUNITY)
Admission: EM | Disposition: A | Discharge status: Home Health | Payer: Self-pay | Source: Home / Self Care | Attending: Internal Medicine

## 2012-04-14 ENCOUNTER — Encounter (HOSPITAL_COMMUNITY): Payer: Self-pay | Admitting: Gastroenterology

## 2012-04-14 DIAGNOSIS — C494 Malignant neoplasm of connective and soft tissue of abdomen: Secondary | ICD-10-CM

## 2012-04-14 HISTORY — PX: COLONOSCOPY: SHX5424

## 2012-04-14 LAB — BASIC METABOLIC PANEL
BUN: 9 mg/dL (ref 6–23)
Calcium: 8.2 mg/dL — ABNORMAL LOW (ref 8.4–10.5)
Creatinine, Ser: 1 mg/dL (ref 0.50–1.35)
GFR calc Af Amer: 86 mL/min — ABNORMAL LOW (ref >90)
GFR calc non Af Amer: 74 mL/min — ABNORMAL LOW (ref >90)

## 2012-04-14 LAB — GLUCOSE, CAPILLARY
Glucose-Capillary: 328 mg/dL — ABNORMAL HIGH (ref 70–99)
Glucose-Capillary: 270 mg/dL — ABNORMAL HIGH (ref 70–99)

## 2012-04-14 LAB — CBC
HCT: 31.3 % — ABNORMAL LOW (ref 39.0–52.0)
MCHC: 32.6 g/dL (ref 30.0–36.0)
MCV: 89.9 fL (ref 78.0–100.0)
Platelets: 338 10*3/uL (ref 150–400)
RDW: 14.9 % (ref 11.5–15.5)
WBC: 9.1 10*3/uL (ref 4.0–10.5)

## 2012-04-14 SURGERY — COLONOSCOPY
Anesthesia: Moderate Sedation

## 2012-04-14 MED ORDER — METOPROLOL TARTRATE 25 MG PO TABS
25.0000 mg | ORAL_TABLET | Freq: Two times a day (BID) | ORAL | Status: DC
  Administered 2012-04-14 – 2012-04-15 (×3): 25 mg via ORAL
  Filled 2012-04-14 (×4): qty 1

## 2012-04-14 MED ORDER — MIDAZOLAM HCL 5 MG/5ML IJ SOLN
INTRAMUSCULAR | Status: DC | PRN
  Administered 2012-04-14: 2 mg via INTRAVENOUS
  Administered 2012-04-14 (×2): 1 mg via INTRAVENOUS

## 2012-04-14 MED ORDER — FENTANYL CITRATE 0.05 MG/ML IJ SOLN
INTRAMUSCULAR | Status: AC
  Filled 2012-04-14: qty 2

## 2012-04-14 MED ORDER — FENTANYL CITRATE 0.05 MG/ML IJ SOLN
INTRAMUSCULAR | Status: DC | PRN
  Administered 2012-04-14 (×2): 25 ug via INTRAVENOUS

## 2012-04-14 MED ORDER — MIDAZOLAM HCL 5 MG/ML IJ SOLN
INTRAMUSCULAR | Status: AC
  Filled 2012-04-14: qty 2

## 2012-04-14 NOTE — Evaluation (Signed)
Physical Therapy Evaluation Patient Details Name: [name] MRN: [id number] DOB: 12-23-1940 Today's Date: 04/14/2012 Time: 5621-3086 PT Time Calculation (min): 31 min  PT Assessment / Plan / Recommendation Clinical Impression  pt admitted progressive weakness, fatigue, and AMS.  On eval, pt is significantly weak with gait instability and decr activity tolerance.  Pt can benefit from PT to maximize independence.    PT Assessment  Patient needs continued PT services    Follow Up Recommendations  CIR    Does the patient have the potential to tolerate intense rehabilitation      Barriers to Discharge Decreased caregiver support      Equipment Recommendations  Other (comment) (TBD post acute)    Recommendations for Other Services Rehab consult   Frequency Min 3X/week    Precautions / Restrictions Precautions Precautions: Fall Restrictions Weight Bearing Restrictions: No   Pertinent Vitals/Pain       Mobility  Bed Mobility Bed Mobility: Right Sidelying to Sit;Sitting - Scoot to Edge of Bed;Sit to Supine Right Sidelying to Sit: 5: Supervision;HOB flat Sitting - Scoot to Edge of Bed: 5: Supervision;With rail Sit to Supine: 5: Supervision;HOB flat Details for Bed Mobility Assistance: struggle, but safe Transfers Transfers: Sit to Stand;Stand to Sit Sit to Stand: 4: Min guard;With upper extremity assist;From bed Stand to Sit: 4: Min guard;With upper extremity assist;To bed Details for Transfer Assistance: vc's for hand palcement and safety Ambulation/Gait Ambulation/Gait Assistance: 4: Min assist Ambulation Distance (Feet): 120 Feet Assistive device: Other (Comment) (pushing IV pole,  likely needs more stable device) Ambulation/Gait Assistance Details: significantly weak gait with instability at knees and hips due to weakness Gait Pattern: Step-through pattern;Decreased step length - right;Decreased step length - left;Decreased stride length;Narrow base of support  (decr knee control bil) Gait velocity: slow    Exercises     PT Diagnosis: Difficulty walking;Generalized weakness  PT Problem List: Decreased strength;Decreased activity tolerance;Decreased balance;Decreased mobility;Decreased knowledge of use of DME PT Treatment Interventions:     PT Goals Acute Rehab PT Goals PT Goal Formulation: With patient Time For Goal Achievement: 04/28/12 Potential to Achieve Goals: Good Pt will go Supine/Side to Sit: with modified independence PT Goal: Supine/Side to Sit - Progress: Goal set today Pt will go Sit to Stand: with supervision PT Goal: Sit to Stand - Progress: Goal set today Pt will Transfer Bed to Chair/Chair to Bed: with supervision PT Transfer Goal: Bed to Chair/Chair to Bed - Progress: Goal set today Pt will Ambulate: 51 - 150 feet;with supervision;with least restrictive assistive device PT Goal: Ambulate - Progress: Goal set today  Visit Information  Last PT Received On: 04/14/12 Assistance Needed: +1    Subjective Data  Subjective: As I've told everyone, I didn't know that I came to the hospital..Marland KitchenI'm alot worse than before. Patient Stated Goal: Get back home after I found out what's wrong   Prior Functioning  Home Living Lives With: Alone Available Help at Discharge: Available PRN/intermittently Type of Home: House Home Access: Stairs to enter [employer] of Steps: several Entrance Stairs-Rails: Right;Left Home Layout: One level Home Adaptive Equipment: None Prior Function Level of Independence: Independent with assistive device(s) (uses furniture to walk in the house) Able to Take Stairs?: Yes Driving: Yes Communication Communication: No difficulties    Cognition  Cognition Overall Cognitive Status: Appears within functional limits for tasks assessed/performed Arousal/Alertness: Awake/alert Orientation Level: Appears intact for tasks assessed Behavior During Session: [facility] for tasks performed     Extremity/Trunk Assessment Right Lower  Extremity Assessment RLE ROM/Strength/Tone: Deficits RLE ROM/Strength/Tone Deficits: hip flexors, hams, quad >=3/5, pf/df 3-/5 RLE Coordination: WFL - gross/fine motor Left Lower Extremity Assessment LLE ROM/Strength/Tone: Deficits LLE ROM/Strength/Tone Deficits: hip flexors, ham, quads 3/5; pf, df 3-/5 LLE Coordination: WFL - gross/fine motor Trunk Assessment Trunk Assessment: Normal   Balance Balance Balance Assessed: No  End of Session PT - End of Session Equipment Utilized During Treatment: Gait belt Activity Tolerance: Patient tolerated treatment well;Patient limited by fatigue Patient left: Other (comment) (sitting EOB talking to MD) Nurse Communication: Mobility status  GP     Okie Bogacz, Eliseo Gum 04/14/2012, 3:46 PM 04/14/2012  [place] Bing, PT [phone] (pager)

## 2012-04-14 NOTE — Op Note (Signed)
[facility] [address] [place], [address]   COLONOSCOPY PROCEDURE REPORT  PATIENT: [name], [name]  MR#: [id number] BIRTHDATE: 13-Sep-1940 , 71  yrs. old GENDER: Male ENDOSCOPIST: Dorena Cookey, MD REFERRED BY: PROCEDURE DATE:  04/14/2012 PROCEDURE: ASA CLASS: INDICATIONS:  anemia and heme positive stools MEDICATIONS:  fentanyl 50 mcg, Versed 4 mg.  DESCRIPTION OF PROCEDURE: good prep. Examined to the cecum confirmed by transillumination and ovaries point visualization of ileocecal valve and appendiceal orifice the cecum descending and transverse colon appeared normal with no masses polyps diverticula or other mucosal abnormalities. Within the descending and sigmoid colon there were seen a few scattered diverticuli and no other abnormalities. The rectum appeared normal.     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

## 2012-04-14 NOTE — Progress Notes (Signed)
Patient ID: [name], male   DOB: [birth year], 72 y.o.   MRN: [id number] General Surgery:    Came to see patient but he was off floor getting colonoscopy.  Will come back later today.  [name] 04/14/2012 9:53 AM

## 2012-04-14 NOTE — Consult Note (Signed)
Patient has been off of his Plavix and can have surgery in the near future.  He needs what looks like a total gastrectomy.  I will talk this over with my partner who his a surgical oncologist for her suggestions.  Marta Lamas. Gae Bon, MD, FACS [phone] [facility]

## 2012-04-14 NOTE — Progress Notes (Signed)
Patient ID: [name]  male  NFA:[id number]    DOB: 01-16-1941    DOA: 04/05/2012  PCP: [name], MD  Assessment/Plan:  1. Subacute upper GI bleeding: negative EGD on 2/19. - [facility] following. Clinically no further bleeding. - C-scope today 2. Left upper quadrant mass: Initially seen on renal ultrasound and confirmed with non contrasted CT abdomen, no  abd pain, dec appetite.DDx GIST, Lymphoma, sarcoma.  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 Acute blood loss anemia: S/p 6 PRBCs, H/H stable 4. Atrial fibrillation: in sinus rhythm. Antiplatelets held second GI bleeding. Resumed low dose beta blockers. 5. Acute on CKD: ARF resolved.  6. Spingomonas Paucimobilis UTI: recent urinary tract manipulation/penile prosthesis implantation: completed 7 days of rocephin 7. Leukocytosis: ? stress margination. resolved. 8. History of CVA: CT head without acute findings. No focal deficits at this time. Antiplatelets held secondary to GI bleed. 9. History of depression: Continue Seroquel. 10. Uncontrolled type II DM:  Fairly controlled. 11. Hypertension: increased beta blockers. 12. Altered mental status: Possibly secondary to symptomatic anemia and UTI, resolved 13. Recent penile implant and circumcision: No acute findings. Discussed by Dr Waymon Amato with Dr. Brunilda Payor on 2/20-sutures are self absorbable and okay to use condom catheter. Outpatient followup. 14. Mild hypernatremia and hyperchloremia: improved. 15. Lactic acidosis: Likely secondary to hypoperfusion from GI bleeding and anemia. Resolved.  DVT Prophylaxis: SCD's  Code Status: DNR  Disposition:    Subjective: denies any specific complaints at this time,awaiting C-scope today  Objective: Weight change:   Intake/Output Summary (Last 24 hours) at 04/14/12 0842 Last data filed at 04/13/12 1700  Gross per 24 hour  Intake   1677 ml  Output    800 ml   Net    877 ml   Blood pressure 150/68, pulse 63, temperature 98.2 F (36.8 C), temperature source Oral, resp. rate 18, height 5\' 6"  (1.676 m), weight 79.652 kg (175 lb 9.6 oz), SpO2 97.00%.  Physical Exam: General: Alert and awake, oriented x3, not in any acute distress. CVS: S1-S2 clear, no murmur rubs or gallops Chest: clear to auscultation bilaterally, no wheezing, rales or rhonchi Abdomen: soft nontender, nondistended, normal bowel sounds,mass in LUQ Extremities: no cyanosis, clubbing or edema noted bilaterally   Lab Results: Basic Metabolic Panel:  Recent Labs Lab 04/11/12 0700 04/13/12 0624 04/14/12 0605  NA 142  --  143  K 4.3  --  3.6  CL 108  --  110  CO2 28  --  28  GLUCOSE 171*  --  90  BUN 13  --  9  CREATININE 1.09  --  1.00  CALCIUM 8.6  --  8.2*  MG 1.5 2.0  --    Liver Function Tests:  Recent Labs Lab 04/11/12 0700  AST 28  ALT 32  ALKPHOS 87  BILITOT 0.2*  PROT 5.7*  ALBUMIN 2.0*   No results found for this basename: LIPASE, AMYLASE,  in the last 168 hours No results found for this basename: AMMONIA,  in the last 168 hours CBC:  Recent Labs Lab 04/12/12 0500 04/14/12 0605  WBC 9.8 9.1  HGB 10.0* 10.2*  HCT 29.7* 31.3*  MCV 87.6 89.9  PLT 332 338   Cardiac Enzymes: No results found for this basename: CKTOTAL, CKMB, CKMBINDEX, TROPONINI,  in the last 168 hours BNP: No components found with this basename: POCBNP,  CBG:  Recent Labs Lab  04/13/12 0746 04/13/12 1119 04/13/12 1618 04/13/12 2222 04/14/12 0822  GLUCAP 176* 96 100* 83 95     Micro Results: Recent Results (from the past 240 hour(s))  MRSA PCR SCREENING     Status: None   Collection Time    04/05/12  6:35 PM      Result Value Range Status   MRSA by PCR NEGATIVE  NEGATIVE Final   Comment:            The GeneXpert MRSA Assay (FDA     approved for NASAL specimens     only), is one component of a     comprehensive MRSA colonization     surveillance program. It  is not     intended to diagnose MRSA     infection nor to guide or     monitor treatment for     MRSA infections.  URINE CULTURE     Status: None   Collection Time    04/05/12 11:06 PM      Result Value Range Status   Specimen Description URINE, CLEAN CATCH   Final   Special Requests NONE   Final   Culture  Setup Time 04/05/2012 23:47   Final   Colony Count 60,000 COLONIES/ML   Final   Culture SPHINGOMONAS PAUCIMOBILIS   Final   Report Status 04/08/2012 FINAL   Final   Organism ID, Bacteria SPHINGOMONAS PAUCIMOBILIS   Final    Studies/Results: Ct Abdomen Pelvis Wo Contrast  04/06/2012  *RADIOLOGY REPORT*  Clinical Data: Left upper quadrant mass on ultrasound  CT ABDOMEN AND PELVIS WITHOUT CONTRAST  Technique:  Multidetector CT imaging of the abdomen and pelvis was performed following the standard protocol without intravenous contrast.  Comparison: Ultrasound of the kidneys of 04/05/2012  Findings: The lung bases are clear.  There is moderate cardiomegaly present.  No pericardial effusion is seen.  The liver is unremarkable in the unenhanced state.  Faintly calcified gallstones are noted within the somewhat distended gallbladder and the gallbladder wall is slightly thickened as well and clinical correlation is recommended to exclude cholecystitis.  The pancreas appears somewhat atrophic and the pancreatic duct is not dilated. The adrenal glands are both slightly nodular most consistent with adrenal adenomas.  Metastatic involvement would be difficult to exclude.  The spleen is normal in size.  There is a large rounded soft tissue mass within the left upper quadrant as noted by ultrasound which appears to be intimately associated with the greater curvature of the stomach.  This mass measures approximately 11.1 x 12.7 x 13.2 cm and is somewhat inhomogeneous in attenuation on this unenhanced study.  The primary considerations are that of a gastrointestinal stromal tumor (G I S T tumor), gastric  carcinoma or massive adenopathy as with lymphoma. Biopsy could be performed in view of the location of this mass.  The kidneys are unremarkable in the unenhanced state.  No renal calculi are noted.  No adenopathy is seen.   The abdominal aorta is normal in caliber.  The urinary bladder is unremarkable.  A reservoir for penile prosthesis is noted just anterior to the urinary bladder.  The prostate is slightly prominent.  No abnormality of the colon is seen.  The appendix is unremarkable.  There are degenerative changes diffusely throughout the facet joints of the lower lumbar spine.  IMPRESSION:  1.  Large somewhat inhomogeneous rounded soft tissue mass in the left upper quadrant appears to be intimately associated with the greater curvature of the  stomach and may represent a GIST tumor versus gastric carcinoma  carcinoma  carcinoma or adenopathy as with lymphoma.  Biopsy could be performed. 2.  Faintly calcified gallstones within a somewhat thick-walled gallbladder.  Rule out acute cholecystitis. 3.  Nodular adrenal glands.  Possibly adrenal adenomas but cannot exclude metastatic involvement.   Original Report Authenticated By: Dwyane Dee, [name]    Dg Chest 2 View  04/05/2012  *RADIOLOGY REPORT*  Clinical Data: Altered mental status.  CHEST - 2 VIEW  Comparison: 03/03/2012.  Findings: The cardiac silhouette, mediastinal and hilar contours are stable.  Stable surgical changes from bypass surgery.  No acute pulmonary findings.  No pleural effusion or pneumothorax.  The bony thorax is intact.  IMPRESSION: No acute cardiopulmonary findings.   Original Report Authenticated By: Rudie Meyer, [name]    Ct Head Wo Contrast  04/05/2012  *RADIOLOGY REPORT*  Clinical Data: Weakness and altered mental status.  CT HEAD WITHOUT CONTRAST  Technique:  Contiguous axial images were obtained from the base of the skull through the vertex without contrast.  Comparison: 08/22/2010.  Findings: Stable age related cerebral atrophy,  ventriculomegaly and periventricular white matter disease.  No extra-axial fluid collections.  No CT findings for hemispheric infarction and/or intracranial hemorrhage.  No mass lesions.  The brainstem and cerebellum are normal and stable. There is a remote right cerebellar hemispheric infarction.  The bony structures are intact.  No skull fracture.  Mild mucoperiosteal thickening in the left half of the sphenoid sinus and scattered ethmoid air cells.  The mastoid air cells are clear.  IMPRESSION:  1. Stable age related cerebral atrophy, ventriculomegaly and periventricular white matter disease. 2.  Remote right-sided cerebellar infarction. 3.  No acute intracranial findings or mass lesion.   Original Report Authenticated By: Rudie Meyer, [name]    US Renal  04/05/2012  *RADIOLOGY REPORT*  Clinical Data: Acute renal failure  RENAL/URINARY TRACT ULTRASOUND COMPLETE  Comparison:  03/10/2006 CT  Findings:  Right Kidney:  Measures 10.8 cm.  No hydronephrosis.  1.5 x 1.3 cm interpolar hypoechoic lesion is incompletely characterized.  Left Kidney:  Measures 10.5 cm.  No hydronephrosis or focal abnormality.  Bladder:  Not visualized.  The patient has a Foley catheter in place.  Incidental note is made of a mixed cystic and solid mass in the left upper quadrant.  IMPRESSION: No hydronephrosis.  Incompletely characterized 1.5 cm hypoechoic right renal lesion, favored to reflect a mildly complex cyst.  Incompletely characterized 15 cm left upper quadrant mass.  Contrast enhanced CT or MRI recommended to further evaluate the above findings.   Original Report Authenticated By: Jearld Lesch, [name]    US Biopsy  04/11/2012  *RADIOLOGY REPORT*  ULTRASOUND-GUIDED CORE BIOPSY  Date: 04/11/2012  Clinical History: 72 year old male with areas of recently diagnosed left upper quadrant mass appears pedunculated and exophytic from the greater curvature of the stomach.  He presents for ultrasound- guided biopsy to facilitate tissue  diagnosis.  Procedures Performed: 1. Ultrasound-guided core biopsy  Interventional Radiologist:  [name], MD  Sedation: Moderate (conscious) sedation was used.  One mg Versed, 50 mcg Fentanyl were administered intravenously.  The patient's vital signs were monitored continuously by radiology nursing throughout the procedure.  Sedation Time: 10 minutes  Fluoroscopy time: None  Contrast volume: None  PROCEDURE/FINDINGS:   Informed consent was obtained from the patient following explanation of the procedure, risks, benefits and alternatives. The patient understands, agrees and consents for the procedure. All questions were addressed. A  time out was performed.  Maximal barrier sterile technique utilized including caps, mask, sterile gowns, sterile gloves, large sterile drape, hand hygiene, and betadine skin prep.  The left upper quadrant was interrogated with ultrasound.  There is a large complex cystic and solid mass in the left upper quadrant which is inseparable from the stomach.  The internal cystic area likely represent areas of internal necrosis. A suitable skin entry site was selected marked.  Local anesthesia was obtained by infiltration of 1% lidocaine.  Under direct sonographic guidance, a 17 gauge trocar needle was advanced through the abdominal wall and into the mass.  Several 18-gauge core biopsies were then coaxially obtained from the solid portion of the mass.  Biopsies were placed in formalin.  As the 17 gauge outer cannula was removed, the biopsy tract was embolized with a Gelfoam slurry.  Post biopsy imaging demonstrated no active hemorrhage or perilesional hematoma.  The patient tolerated the procedure well.  No immediate complication.  IMPRESSION:  Technically successful ultrasound guided core biopsy of left upper quadrant mass lesion which is likely arising from the gastric wall.  Signed,  [name], MD Vascular & Interventional Radiologist [facility]   Original  Report Authenticated By: [name], [name]     Medications: Scheduled Meds: . atorvastatin  40 mg Oral Daily  . cholecalciferol  5,000 Units Oral Daily  . docusate sodium  100 mg Oral BID  . feeding supplement  1 Container Oral TID BM  . insulin aspart  0-5 Units Subcutaneous QHS  . insulin aspart  0-9 Units Subcutaneous TID WC  . insulin glargine  20 Units Subcutaneous QHS  . metoprolol tartrate  12.5 mg Oral BID  . pantoprazole  40 mg Oral Daily  . polyethylene glycol  17 g Oral Daily  . QUEtiapine  200 mg Oral QHS  . tamsulosin  0.4 mg Oral Daily      LOS: 9 days   Jovonta Levit [name] [facility] 04/14/2012, 8:42 AM Pager: [phone]  If 7PM-7AM, please contact night-coverage [email or web address] Password [email or web address]

## 2012-04-14 NOTE — Progress Notes (Signed)
Patient underwent colonoscopy today good study to the cecum revealed only diverticulosis. Unsure the reason for his anemia and heme positive stools but both EGD and colonoscopy unrevealing. Await surgical consult for assessment of spindle cell tumor in the left upper quadrant.

## 2012-04-14 NOTE — Consult Note (Signed)
Reason for Consult: GIST tumor/GI bleed Referring Physician: Dr. Rod Can  [name] is an 72 y.o. male.  HPI: 72 yr old male who appears less then age, who presented to [facility] with syncope and anemia.  Anemia was felt to be secondary to GI bleed.  Patient had noticed black stools in the last 2-3 weeks and continued to occur.  A family member went to check on him on 2/18 and found him unconscious on the floor.  In the ED his Hgb was 4.1 (on 03/03/12 it was 12.2) and positive fecal occult blood test.  He was transfused several units of blood and this has been stable.  The evaluation also showed a LUQ soft tissue mass.  This was biopsied and showed GIST tumor.  GI has evaluated the patient with endoscopy and colonoscopy and cannot find a source for his significant anemia.  We are consulted to evaluate the patient for surgical intervention of his GIST tumor.  The patient has not had any other symptoms in the last 3 weeks except the melena.  He denies abdominal pain, nausea, vomiting, diarrhea, constipation or poor appetite.   PMH includes poorly controlled diabetes, CAD with CABG, hypertension, GERD, hx of CVA and atrial fibrillation(ASA only).  Past Medical History  Diagnosis Date  . Coronary artery disease   . Hypertension   . Depression   . Diabetes mellitus without complication   . Stroke 72 years old  . GERD (gastroesophageal reflux disease)     hx of  . Arthritis     hx of  . Erectile dysfunction   . Atrial fibrillation     Past Surgical History  Procedure Laterality Date  . Coronary artery bypass graft    . Eye surgery      cataracts bilateral  . Carpal tunnel release      bilateral  . Tonsillectomy    . Penile prosthesis implant  03/15/2012    Procedure: PENILE PROTHESIS INFLATABLE;  Surgeon: Lindaann Slough, MD;  Location: [facility] ORS;  Service: Urology;  Laterality: N/A;  . Circumcision  03/15/2012    Procedure: CIRCUMCISION ADULT;  Surgeon: Lindaann Slough, MD;  Location: [facility] ORS;   Service: Urology;  Laterality: N/A;  . Esophagogastroduodenoscopy N/A 04/06/2012    Procedure: ESOPHAGOGASTRODUODENOSCOPY (EGD);  Surgeon: Graylin Shiver, MD;  Location: [facility] ENDOSCOPY;  Service: Endoscopy;  Laterality: N/A;    History reviewed. No pertinent family history.  Social History:  reports that he has never smoked. He has never used smokeless tobacco. He reports that he does not drink alcohol or use illicit drugs.  Allergies: No Known Allergies  Medications: I have reviewed the patient's current medications.  Results for orders placed during the hospital encounter of 04/05/12 (from the past 48 hour(s))  GLUCOSE, CAPILLARY     Status: Abnormal   Collection Time    04/12/12 11:50 AM      Result Value Range   Glucose-Capillary 128 (*) 70 - 99 mg/dL  GLUCOSE, CAPILLARY     Status: Abnormal   Collection Time    04/12/12  5:08 PM      Result Value Range   Glucose-Capillary 173 (*) 70 - 99 mg/dL  GLUCOSE, CAPILLARY     Status: Abnormal   Collection Time    04/12/12  9:11 PM      Result Value Range   Glucose-Capillary 195 (*) 70 - 99 mg/dL   Comment 1 Notify RN    MAGNESIUM     Status: None  Collection Time    04/13/12  6:24 AM      Result Value Range   Magnesium 2.0  1.5 - 2.5 mg/dL  GLUCOSE, CAPILLARY     Status: Abnormal   Collection Time    04/13/12  7:46 AM      Result Value Range   Glucose-Capillary 176 (*) 70 - 99 mg/dL   Comment 1 Notify RN    GLUCOSE, CAPILLARY     Status: None   Collection Time    04/13/12 11:19 AM      Result Value Range   Glucose-Capillary 96  70 - 99 mg/dL   Comment 1 Notify RN    GLUCOSE, CAPILLARY     Status: Abnormal   Collection Time    04/13/12  4:18 PM      Result Value Range   Glucose-Capillary 100 (*) 70 - 99 mg/dL   Comment 1 Notify RN    GLUCOSE, CAPILLARY     Status: None   Collection Time    04/13/12 10:22 PM      Result Value Range   Glucose-Capillary 83  70 - 99 mg/dL  CBC     Status: Abnormal   Collection Time     04/14/12  6:05 AM      Result Value Range   WBC 9.1  4.0 - 10.5 K/uL   RBC 3.48 (*) 4.22 - 5.81 MIL/uL   Hemoglobin 10.2 (*) 13.0 - 17.0 g/dL   HCT 16.1 (*) 09.6 - 04.5 %   MCV 89.9  78.0 - 100.0 fL   MCH 29.3  26.0 - 34.0 pg   MCHC 32.6  30.0 - 36.0 g/dL   RDW 40.9  81.1 - 91.4 %   Platelets 338  150 - 400 K/uL  BASIC METABOLIC PANEL     Status: Abnormal   Collection Time    04/14/12  6:05 AM      Result Value Range   Sodium 143  135 - 145 mEq/L   Potassium 3.6  3.5 - 5.1 mEq/L   Chloride 110  96 - 112 mEq/L   CO2 28  19 - 32 mEq/L   Glucose, Bld 90  70 - 99 mg/dL   BUN 9  6 - 23 mg/dL   Creatinine, Ser 7.82  0.50 - 1.35 mg/dL   Calcium 8.2 (*) 8.4 - 10.5 mg/dL   GFR calc non Af Amer 74 (*) >90 mL/min   GFR calc Af Amer 86 (*) >90 mL/min   Comment:            The eGFR has been calculated     using the CKD EPI equation.     This calculation has not been     validated in all clinical     situations.     eGFR's persistently     <90 mL/min signify     possible Chronic Kidney Disease.  GLUCOSE, CAPILLARY     Status: None   Collection Time    04/14/12  8:22 AM      Result Value Range   Glucose-Capillary 95  70 - 99 mg/dL   Comment 1 Documented in Chart     Comment 2 Notify RN      No results found.  Review of Systems  Constitutional: Negative.   HENT: Negative.   Eyes: Negative.   Respiratory: Negative.   Cardiovascular: Negative.   Gastrointestinal: Positive for blood in stool and melena. Negative for nausea, vomiting and abdominal pain.  Genitourinary: Negative.   Musculoskeletal: Negative.   Skin: Negative.   Neurological: Negative.   Endo/Heme/Allergies: Negative.   Psychiatric/Behavioral: Negative.    Blood pressure 174/82, pulse 100, temperature 98.2 F (36.8 C), temperature source Oral, resp. rate 15, height 5\' 6"  (1.676 m), weight 175 lb 9.6 oz (79.652 kg), SpO2 100.00%. Physical Exam  Constitutional: He is oriented to person, place, and time. He  appears well-developed and well-nourished. No distress.  HENT:  Head: Normocephalic and atraumatic.  Eyes: Conjunctivae are normal. Pupils are equal, round, and reactive to light.  Cardiovascular: Normal rate and regular rhythm.   Scar from CABG  GI: Soft. Bowel sounds are normal. He exhibits no distension and no mass. There is no tenderness.  Genitourinary:  deferred  Musculoskeletal: Normal range of motion.  Neurological: He is alert and oriented to person, place, and time.  Skin: Skin is warm and dry.  Psychiatric: He has a normal mood and affect. His behavior is normal. Thought content normal.    Assessment/Plan: 1. 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 Lindie Spruce will need to review the CT to make recommendations about how and when to do resection.  Would consult Heme/onc as well.  Dr. Lindie Spruce will be by to see the patient later today to make recs and discuss the course with the patient.  Vivyan Biggers 04/14/2012, 11:46 AM

## 2012-04-15 ENCOUNTER — Encounter (HOSPITAL_COMMUNITY)
Admission: EM | Disposition: A | Discharge status: Home Health | Payer: Self-pay | Source: Home / Self Care | Attending: Internal Medicine

## 2012-04-15 ENCOUNTER — Inpatient Hospital Stay (HOSPITAL_COMMUNITY): Payer: Medicare Other | Admitting: Anesthesiology

## 2012-04-15 ENCOUNTER — Encounter (HOSPITAL_COMMUNITY): Payer: Self-pay | Admitting: Anesthesiology

## 2012-04-15 ENCOUNTER — Encounter (HOSPITAL_COMMUNITY): Payer: Self-pay | Admitting: Gastroenterology

## 2012-04-15 HISTORY — PX: GASTRIC RESECTION: SHX5248

## 2012-04-15 LAB — SURGICAL PCR SCREEN
MRSA, PCR: NEGATIVE
Staphylococcus aureus: NEGATIVE

## 2012-04-15 LAB — BASIC METABOLIC PANEL
BUN: 10 mg/dL (ref 6–23)
CO2: 27 mEq/L (ref 19–32)
Calcium: 8 mg/dL — ABNORMAL LOW (ref 8.4–10.5)
Chloride: 108 mEq/L (ref 96–112)
Creatinine, Ser: 1.01 mg/dL (ref 0.50–1.35)
Glucose, Bld: 139 mg/dL — ABNORMAL HIGH (ref 70–99)

## 2012-04-15 LAB — CBC
HCT: 28.8 % — ABNORMAL LOW (ref 39.0–52.0)
Hemoglobin: 9.4 g/dL — ABNORMAL LOW (ref 13.0–17.0)
Hemoglobin: 10.1 g/dL — ABNORMAL LOW (ref 13.0–17.0)
MCH: 29.4 pg (ref 26.0–34.0)
MCH: 29.5 pg (ref 26.0–34.0)
MCHC: 33 g/dL (ref 30.0–36.0)
MCV: 90 fL (ref 78.0–100.0)
MCV: 89.5 fL (ref 78.0–100.0)
Platelets: 299 10*3/uL (ref 150–400)
RBC: 3.2 MIL/uL — ABNORMAL LOW (ref 4.22–5.81)
RBC: 3.42 MIL/uL — ABNORMAL LOW (ref 4.22–5.81)
WBC: 9.2 10*3/uL (ref 4.0–10.5)

## 2012-04-15 LAB — PREPARE RBC (CROSSMATCH)

## 2012-04-15 SURGERY — GASTRIC RESECTION
Anesthesia: General | Site: Abdomen | Wound class: Clean Contaminated

## 2012-04-15 MED ORDER — MIDAZOLAM HCL 5 MG/5ML IJ SOLN
INTRAMUSCULAR | Status: DC | PRN
  Administered 2012-04-15 (×2): 1 mg via INTRAVENOUS

## 2012-04-15 MED ORDER — SODIUM CHLORIDE 0.9 % IV SOLN
INTRAVENOUS | Status: DC
  Administered 2012-04-15: 35 mL/h via INTRAVENOUS

## 2012-04-15 MED ORDER — EPHEDRINE SULFATE 50 MG/ML IJ SOLN
INTRAMUSCULAR | Status: DC | PRN
  Administered 2012-04-15 (×2): 5 mg via INTRAVENOUS
  Administered 2012-04-15: 10 mg via INTRAVENOUS

## 2012-04-15 MED ORDER — CEFAZOLIN SODIUM 1-5 GM-% IV SOLN
1.0000 g | Freq: Four times a day (QID) | INTRAVENOUS | Status: AC
  Administered 2012-04-15 – 2012-04-16 (×3): 1 g via INTRAVENOUS
  Filled 2012-04-15 (×4): qty 50

## 2012-04-15 MED ORDER — HYDROMORPHONE 0.3 MG/ML IV SOLN
INTRAVENOUS | Status: AC
  Administered 2012-04-15: 16:00:00
  Filled 2012-04-15: qty 25

## 2012-04-15 MED ORDER — ROCURONIUM BROMIDE 100 MG/10ML IV SOLN
INTRAVENOUS | Status: DC | PRN
  Administered 2012-04-15: 50 mg via INTRAVENOUS

## 2012-04-15 MED ORDER — LACTATED RINGERS IV SOLN
INTRAVENOUS | Status: DC | PRN
  Administered 2012-04-15: 14:00:00 via INTRAVENOUS

## 2012-04-15 MED ORDER — CEFAZOLIN SODIUM-DEXTROSE 2-3 GM-% IV SOLR
2.0000 g | INTRAVENOUS | Status: AC
  Administered 2012-04-15: 2 g via INTRAVENOUS
  Filled 2012-04-15: qty 50

## 2012-04-15 MED ORDER — ENOXAPARIN SODIUM 30 MG/0.3ML ~~LOC~~ SOLN
40.0000 mg | SUBCUTANEOUS | Status: DC
  Administered 2012-04-16 – 2012-04-23 (×8): 40 mg via SUBCUTANEOUS
  Filled 2012-04-15 (×11): qty 0.4

## 2012-04-15 MED ORDER — INSULIN ASPART 100 UNIT/ML ~~LOC~~ SOLN
0.0000 [IU] | SUBCUTANEOUS | Status: DC
  Administered 2012-04-15 – 2012-04-16 (×2): 2 [IU] via SUBCUTANEOUS
  Administered 2012-04-16 (×3): 1 [IU] via SUBCUTANEOUS
  Administered 2012-04-16 (×2): 2 [IU] via SUBCUTANEOUS
  Administered 2012-04-17 (×5): 1 [IU] via SUBCUTANEOUS
  Administered 2012-04-19 (×2): 2 [IU] via SUBCUTANEOUS
  Administered 2012-04-19 – 2012-04-20 (×3): 1 [IU] via SUBCUTANEOUS
  Administered 2012-04-21 (×2): 2 [IU] via SUBCUTANEOUS
  Administered 2012-04-22: 1 [IU] via SUBCUTANEOUS
  Administered 2012-04-22: 3 [IU] via SUBCUTANEOUS
  Administered 2012-04-22 – 2012-04-23 (×4): 1 [IU] via SUBCUTANEOUS

## 2012-04-15 MED ORDER — DIPHENHYDRAMINE HCL 12.5 MG/5ML PO ELIX
12.5000 mg | ORAL_SOLUTION | Freq: Four times a day (QID) | ORAL | Status: DC | PRN
  Filled 2012-04-15: qty 5

## 2012-04-15 MED ORDER — POVIDONE-IODINE 10 % EX OINT
TOPICAL_OINTMENT | CUTANEOUS | Status: DC | PRN
  Administered 2012-04-15: 1 via TOPICAL

## 2012-04-15 MED ORDER — ONDANSETRON HCL 4 MG/2ML IJ SOLN: 4.0000 mg | Freq: Four times a day (QID) | INTRAMUSCULAR | Status: DC | PRN

## 2012-04-15 MED ORDER — POTASSIUM CHLORIDE IN NACL 20-0.9 MEQ/L-% IV SOLN
INTRAVENOUS | Status: DC
  Administered 2012-04-15 – 2012-04-16 (×2): 125 mL/h via INTRAVENOUS
  Administered 2012-04-16 – 2012-04-18 (×6): via INTRAVENOUS
  Administered 2012-04-19: 100 mL/h via INTRAVENOUS
  Administered 2012-04-19 – 2012-04-23 (×6): via INTRAVENOUS
  Filled 2012-04-15 (×23): qty 1000

## 2012-04-15 MED ORDER — SUCCINYLCHOLINE CHLORIDE 20 MG/ML IJ SOLN
INTRAMUSCULAR | Status: DC | PRN
  Administered 2012-04-15: 100 mg via INTRAVENOUS

## 2012-04-15 MED ORDER — FENTANYL CITRATE 0.05 MG/ML IJ SOLN
INTRAMUSCULAR | Status: DC | PRN
  Administered 2012-04-15 (×2): 50 ug via INTRAVENOUS
  Administered 2012-04-15: 100 ug via INTRAVENOUS
  Administered 2012-04-15: 50 ug via INTRAVENOUS

## 2012-04-15 MED ORDER — GLYCOPYRROLATE 0.2 MG/ML IJ SOLN
INTRAMUSCULAR | Status: DC | PRN
  Administered 2012-04-15: .8 mg via INTRAVENOUS

## 2012-04-15 MED ORDER — DIPHENHYDRAMINE HCL 50 MG/ML IJ SOLN
12.5000 mg | Freq: Four times a day (QID) | INTRAMUSCULAR | Status: DC | PRN
  Administered 2012-04-16: 12.5 mg via INTRAVENOUS
  Filled 2012-04-15: qty 1

## 2012-04-15 MED ORDER — NEOSTIGMINE METHYLSULFATE 1 MG/ML IJ SOLN
INTRAMUSCULAR | Status: DC | PRN
  Administered 2012-04-15: 5 mg via INTRAVENOUS

## 2012-04-15 MED ORDER — HYDROMORPHONE 0.3 MG/ML IV SOLN
INTRAVENOUS | Status: DC
  Administered 2012-04-15 – 2012-04-16 (×2): 0.6 mg via INTRAVENOUS
  Administered 2012-04-16: 1.2 mg via INTRAVENOUS
  Administered 2012-04-16: 0.62 mg via INTRAVENOUS
  Administered 2012-04-16: 0.6 mg via INTRAVENOUS
  Administered 2012-04-16: 1.2 mg via INTRAVENOUS

## 2012-04-15 MED ORDER — ONDANSETRON HCL 4 MG/2ML IJ SOLN: 4.0000 mg | Freq: Once | INTRAMUSCULAR | Status: DC | PRN

## 2012-04-15 MED ORDER — POTASSIUM CHLORIDE 10 MEQ/100ML IV SOLN
10.0000 meq | Freq: Once | INTRAVENOUS | Status: AC
  Administered 2012-04-15: 10 meq via INTRAVENOUS
  Filled 2012-04-15: qty 100

## 2012-04-15 MED ORDER — NALOXONE HCL 0.4 MG/ML IJ SOLN: 0.4000 mg | INTRAMUSCULAR | Status: DC | PRN

## 2012-04-15 MED ORDER — POVIDONE-IODINE 10 % EX OINT
TOPICAL_OINTMENT | CUTANEOUS | Status: AC
  Filled 2012-04-15: qty 28.35

## 2012-04-15 MED ORDER — PROPOFOL 10 MG/ML IV BOLUS
INTRAVENOUS | Status: DC | PRN
  Administered 2012-04-15: 110 mg via INTRAVENOUS
  Administered 2012-04-15: 30 mg via INTRAVENOUS

## 2012-04-15 MED ORDER — LIDOCAINE HCL (CARDIAC) 20 MG/ML IV SOLN
INTRAVENOUS | Status: DC | PRN
  Administered 2012-04-15: 50 mg via INTRAVENOUS

## 2012-04-15 MED ORDER — OXYCODONE HCL 5 MG PO TABS: 5.0000 mg | ORAL_TABLET | Freq: Once | ORAL | Status: DC | PRN

## 2012-04-15 MED ORDER — 0.9 % SODIUM CHLORIDE (POUR BTL) OPTIME
TOPICAL | Status: DC | PRN
  Administered 2012-04-15 (×2): 1000 mL

## 2012-04-15 MED ORDER — HYDROMORPHONE HCL PF 1 MG/ML IJ SOLN
0.2500 mg | INTRAMUSCULAR | Status: DC | PRN
  Administered 2012-04-15 (×2): 0.5 mg via INTRAVENOUS

## 2012-04-15 MED ORDER — OXYCODONE HCL 5 MG/5ML PO SOLN
5.0000 mg | Freq: Once | ORAL | Status: DC | PRN
Start: 2012-04-15 — End: 2012-04-15

## 2012-04-15 MED ORDER — ALBUMIN HUMAN 5 % IV SOLN
INTRAVENOUS | Status: DC | PRN
  Administered 2012-04-15: 15:00:00 via INTRAVENOUS

## 2012-04-15 MED ORDER — SODIUM CHLORIDE 0.9 % IJ SOLN: 9.0000 mL | INTRAMUSCULAR | Status: DC | PRN

## 2012-04-15 MED ORDER — PHENYLEPHRINE HCL 10 MG/ML IJ SOLN
INTRAMUSCULAR | Status: DC | PRN
  Administered 2012-04-15 (×2): 80 ug via INTRAVENOUS

## 2012-04-15 MED ORDER — VECURONIUM BROMIDE 10 MG IV SOLR
INTRAVENOUS | Status: DC | PRN
  Administered 2012-04-15: 3 mg via INTRAVENOUS

## 2012-04-15 MED ORDER — STERILE WATER FOR IRRIGATION IR SOLN
Status: DC | PRN
  Administered 2012-04-15 (×3): 1000 mL

## 2012-04-15 MED ORDER — DEXTROSE-NACL 5-0.45 % IV SOLN: INTRAVENOUS | Status: DC

## 2012-04-15 MED ORDER — HYDROMORPHONE HCL PF 1 MG/ML IJ SOLN
INTRAMUSCULAR | Status: AC
  Filled 2012-04-15: qty 1

## 2012-04-15 SURGICAL SUPPLY — 60 items
BLADE SURG ROTATE 9660 (MISCELLANEOUS) ×1 IMPLANT
CANISTER SUCTION 2500CC (MISCELLANEOUS) ×2 IMPLANT
CATH FOLEY 2WAY SLVR  5CC 20FR (CATHETERS)
CATH FOLEY 2WAY SLVR  5CC 22FR (CATHETERS)
CATH FOLEY 2WAY SLVR 5CC 20FR (CATHETERS) IMPLANT
CATH FOLEY 2WAY SLVR 5CC 22FR (CATHETERS) IMPLANT
CLOTH BEACON ORANGE TIMEOUT ST (SAFETY) ×2 IMPLANT
COVER SURGICAL LIGHT HANDLE (MISCELLANEOUS) ×2 IMPLANT
DRAIN PENROSE 1/2X36 STERILE (WOUND CARE) ×2 IMPLANT
DRAPE LAPAROSCOPIC ABDOMINAL (DRAPES) ×2 IMPLANT
DRAPE UTILITY 15X26 W/TAPE STR (DRAPE) ×4 IMPLANT
DRAPE WARM FLUID 44X44 (DRAPE) ×2 IMPLANT
ELECT BLADE 6.5 EXT (BLADE) ×2 IMPLANT
ELECT CAUTERY BLADE 6.4 (BLADE) ×2 IMPLANT
ELECT REM PT RETURN 9FT ADLT (ELECTROSURGICAL) ×2
ELECTRODE REM PT RTRN 9FT ADLT (ELECTROSURGICAL) ×1 IMPLANT
GLOVE BIO SURGEON STRL SZ8 (GLOVE) ×2 IMPLANT
GLOVE BIOGEL PI IND STRL 6.5 (GLOVE) ×1 IMPLANT
GLOVE BIOGEL PI IND STRL 7.0 (GLOVE) IMPLANT
GLOVE BIOGEL PI IND STRL 7.5 (GLOVE) IMPLANT
GLOVE BIOGEL PI IND STRL 8 (GLOVE) ×1 IMPLANT
GLOVE BIOGEL PI INDICATOR 6.5 (GLOVE) ×1
GLOVE BIOGEL PI INDICATOR 7.0 (GLOVE) ×2
GLOVE BIOGEL PI INDICATOR 7.5 (GLOVE) ×1
GLOVE BIOGEL PI INDICATOR 8 (GLOVE) ×3
GLOVE ECLIPSE 7.5 STRL STRAW (GLOVE) ×3 IMPLANT
GLOVE SURG SS PI 6.0 STRL IVOR (GLOVE) ×2 IMPLANT
GOWN PREVENTION PLUS XLARGE (GOWN DISPOSABLE) ×4 IMPLANT
GOWN STRL NON-REIN LRG LVL3 (GOWN DISPOSABLE) ×6 IMPLANT
KIT BASIN OR (CUSTOM PROCEDURE TRAY) ×2 IMPLANT
KIT ROOM TURNOVER OR (KITS) ×2 IMPLANT
LIGASURE IMPACT 36 18CM CVD LR (INSTRUMENTS) ×2 IMPLANT
NS IRRIG 1000ML POUR BTL (IV SOLUTION) ×4 IMPLANT
PACK GENERAL/GYN (CUSTOM PROCEDURE TRAY) ×2 IMPLANT
PAD ARMBOARD 7.5X6 YLW CONV (MISCELLANEOUS) ×2 IMPLANT
PEN SKIN MARKING BROAD (MISCELLANEOUS) ×2 IMPLANT
RELOAD AUTO 90-3.5 TA90 BLE (ENDOMECHANICALS) IMPLANT
RELOAD AUTO 90-4.8 TA90 GRN (ENDOMECHANICALS) ×2 IMPLANT
RELOAD STAPLE 90 BLU REG (ENDOMECHANICALS) IMPLANT
SPECIMEN JAR LARGE (MISCELLANEOUS) IMPLANT
SPECIMEN JAR X LARGE (MISCELLANEOUS) ×1 IMPLANT
SPONGE GAUZE 4X4 12PLY (GAUZE/BANDAGES/DRESSINGS) ×2 IMPLANT
SPONGE LAP 18X18 X RAY DECT (DISPOSABLE) ×5 IMPLANT
STAPLER 90 3.5 STAND SLIM (STAPLE)
STAPLER 90 3.5 STD SLIM (STAPLE) IMPLANT
STAPLER TA90 4.8 THK SLIMI (STAPLE) ×2 IMPLANT
STAPLER VISISTAT 35W (STAPLE) ×2 IMPLANT
SUCTION POOLE TIP (SUCTIONS) ×2 IMPLANT
SUT NOVA 1 T20/GS 25DT (SUTURE) ×4 IMPLANT
SUT PDS AB 1 TP1 96 (SUTURE) ×4 IMPLANT
SUT SILK 2 0 SH CR/8 (SUTURE) ×5 IMPLANT
SUT SILK 2 0 TIES 10X30 (SUTURE) ×2 IMPLANT
SUT SILK 3 0 SH CR/8 (SUTURE) ×2 IMPLANT
SUT SILK 3 0 TIES 10X30 (SUTURE) ×2 IMPLANT
TOWEL OR 17X24 6PK STRL BLUE (TOWEL DISPOSABLE) ×2 IMPLANT
TOWEL OR 17X26 10 PK STRL BLUE (TOWEL DISPOSABLE) ×2 IMPLANT
TRAY FOLEY CATH 14FRSI W/METER (CATHETERS) ×2 IMPLANT
TUBE MOSS GAS 18FR (TUBING) IMPLANT
WATER STERILE IRR 1000ML POUR (IV SOLUTION) ×3 IMPLANT
YANKAUER SUCT BULB TIP NO VENT (SUCTIONS) ×2 IMPLANT

## 2012-04-15 NOTE — Op Note (Signed)
OPERATIVE REPORT  DATE OF OPERATION: 04/05/2012 - 04/15/2012  PATIENT:  [name]  72 y.o. male  PRE-OPERATIVE DIAGNOSIS:  Gastric GIST tumor  POST-OPERATIVE DIAGNOSIS:  Gastric GIST tumor  PROCEDURE:  Procedure(s): Partial Gastrectomy  SURGEON:  Surgeon(s): Cherylynn Ridges, MD Liz Malady, MD  ASSISTANT: Janee Morn  ANESTHESIA:   general  EBL: 500 ml  BLOOD ADMINISTERED: 350 CC PRBC  DRAINS: Nasogastric Tube and Urinary Catheter (Foley)   SPECIMEN:  Source of Specimen:  Stomach and large gastric tumor  COUNTS CORRECT:  YES  PROCEDURE DETAILS: The patient was taken to the operating room and placed on the table in the supine position. After an adequate general endotracheal anesthetic was administered he was prepped and draped in usual sterile manner exposing his entire abdomen.  After a proper time out was performed identifying the patient and the procedure to be performed, an upper midline incision was made using a #10 blade. This was eventually taken down to below the umbilicus. It was taken all the way to the xiphoid process.  The large bulky tumor could be palpated through the lesser sac and just above the mid to left transverse colon. We entered the lesser sac and using a LigaSure device to take the omentum off of the transverse colon. We then spent the bulk of the time dissecting away the blood vessels, omentum, and neovascular structures away from the tumor itself. It was attached to the greater curvature of the stomach over a length of approximately 12 cm. The the gastroepiploic artery and vein had to be taken on the greater curvature in order to devascularize the tumor. Once we detached it from the surrounding structures with blunt dissection, electrocautery, and sharp dissection we wedged the stomach and tumor away from the greater curvature using 2 TX 90 staplers with the green cartridge. This completely excised the tumor which had taken significant amount of time  to dissect away from surrounding structures. There was bleeding from the staple surface which we oversewed with 2-0 silk sutures. The entire staple line was oversewn with 2-0 silk sutures. There were some large blood vessels which were ligated with 2-0 silk ties. There was one short gastric vessel that was bleeding at the end the case which was suture ligated with 2-0 silk.  The spleen was seen was normal and was not injured during the case. Once we had oversewn the staple line we irrigated with approximately 2 L of room temperature sterile water. During the process of dissecting out this large tumor it did rupture spilling small amounts of tumor particles into the central portion of the wound. All macroscopic pieces were removed.  The surgeon's gloves were changed and we irrigated with sterile water.  Once we had adequate hemostasis requiring the patient being transfuse 1 unit of packed red cells, we did close. The NG tube was fed down into the stomach to the appropriate place above the transected greater curvature. There was no residual tumor left over. All counts were correct including needle sponges and instruments. We closed the abdomen using running looped #1 PDS sutures with intermittent internal retentions of #1 Novafil. The skin was closed using stainless steel staples.  PATIENT DISPOSITION:  PACU - hemodynamically stable.   Cherylynn Ridges 2/28/20144:03 PM

## 2012-04-15 NOTE — Transfer of Care (Signed)
Immediate Anesthesia Transfer of Care Note  Patient: [name]  Procedure(s) Performed: Procedure(s): Partial Gastrectomy (N/A)  Patient Location: PACU  Anesthesia Type:General  Level of Consciousness: awake, alert  and oriented  Airway & Oxygen Therapy: Patient Spontanous Breathing and Patient connected to face mask oxygen  Post-op Assessment: Report given to PACU RN, Post -op Vital signs reviewed and stable and Patient moving all extremities X 4  Post vital signs: Reviewed and stable  Complications: No apparent anesthesia complications

## 2012-04-15 NOTE — Anesthesia Procedure Notes (Signed)
Procedure Name: Intubation Date/Time: 04/15/2012 1:10 PM Performed by: Margaree Mackintosh Pre-anesthesia Checklist: Patient identified, Timeout performed, Emergency Drugs available, Suction available and Patient being monitored Patient Re-evaluated:Patient Re-evaluated prior to inductionOxygen Delivery Method: Circle system utilized Preoxygenation: Pre-oxygenation with 100% oxygen Intubation Type: IV induction, Rapid sequence and Cricoid Pressure applied Laryngoscope Size: Mac and 3 Grade View: Grade II Tube type: Oral Tube size: 7.5 mm Number of attempts: 1 Airway Equipment and Method: Stylet and LTA kit utilized Placement Confirmation: ETT inserted through vocal cords under direct vision,  breath sounds checked- equal and bilateral and positive ETCO2 Secured at: 21 cm Tube secured with: Tape Dental Injury: Teeth and Oropharynx as per pre-operative assessment

## 2012-04-15 NOTE — Progress Notes (Signed)
Rehab Admissions Coordinator Note:  Patient was screened by Clois Dupes for appropriateness for an Inpatient Acute Rehab Consult.  Noted plans for surgery per Dr. Lindie Spruce. At this time, we are recommending await reassessment of pt postoperatively  and see how he is functionally with therapy.  Clois Dupes 04/15/2012, 8:18 AM  I can be reached at [phone].

## 2012-04-15 NOTE — Anesthesia Preprocedure Evaluation (Signed)
Anesthesia Evaluation    Airway Mallampati: II TM Distance: >3 FB Neck ROM: Full    Dental  (+) Teeth Intact and Dental Advisory Given   Pulmonary  breath sounds clear to auscultation        Cardiovascular hypertension, Pt. on medications Rhythm:Regular Rate:Normal     Neuro/Psych    GI/Hepatic   Endo/Other    Renal/GU      Musculoskeletal   Abdominal   Peds  Hematology   Anesthesia Other Findings   Reproductive/Obstetrics                           Anesthesia Physical Anesthesia Plan  ASA: III  Anesthesia Plan: General   Post-op Pain Management:    Induction: Intravenous  Airway Management Planned: Oral ETT  Additional Equipment:   Intra-op Plan:   Post-operative Plan: Extubation in OR  Informed Consent: I have reviewed the patients History and Physical, chart, labs and discussed the procedure including the risks, benefits and alternatives for the proposed anesthesia with the patient or authorized representative who has indicated his/her understanding and acceptance.   Dental advisory given  Plan Discussed with: CRNA, Anesthesiologist and Surgeon  Anesthesia Plan Comments:         Anesthesia Quick Evaluation

## 2012-04-15 NOTE — Progress Notes (Signed)
Discussed the case with surgical oncologist who recommended as expected a partia gastrectomy.  Patient understands the risks and benefits.  Will preop for type and screen.  Marta Lamas. Gae Bon, MD, FACS [phone] [facility]

## 2012-04-15 NOTE — Progress Notes (Signed)
Lab at bedside drawing a cbc

## 2012-04-15 NOTE — Progress Notes (Signed)
CSW received referral today for ? SNF. Pt in OR today, CSW to follow up with d/c planning. Please call CSW if needs arise.Sherald Barge, LCSW-A Clinical Social Worker [phone]

## 2012-04-15 NOTE — Anesthesia Postprocedure Evaluation (Signed)
  Anesthesia Post-op Note  Patient: [name]  Procedure(s) Performed: Procedure(s): Partial Gastrectomy (N/A)  Patient Location: PACU  Anesthesia Type:General  Level of Consciousness: awake  Airway and Oxygen Therapy: Patient Spontanous Breathing  Post-op Pain: mild  Post-op Assessment: Post-op Vital signs reviewed, Patient's Cardiovascular Status Stable, Respiratory Function Stable, Patent Airway, No signs of Nausea or vomiting and Pain level controlled  Post-op Vital Signs: stable  Complications: No apparent anesthesia complications

## 2012-04-15 NOTE — Preoperative (Signed)
Beta Blockers   Reason not to administer Beta Blockers:Not Applicable 

## 2012-04-15 NOTE — Progress Notes (Signed)
Patient ID: [name]  male  RUE:[id number]    DOB: 1940/06/23    DOA: 04/05/2012  PCP: [name], MD  Assessment/Plan:  1. Subacute upper GI bleeding: negative EGD on 2/19. Resolved - [facility] following, EGD and C-scope unrevealing.  2. 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 Acute blood loss anemia: S/p prior 6 PRBCs during hospitalization, 2 units to transfuse for the surgery 4. Atrial fibrillation: in sinus rhythm. Antiplatelets held second GI bleeding. Resumed beta blockers. 5. Acute on CKD: ARF resolved.  6. Spingomonas Paucimobilis UTI: recent urinary tract manipulation/penile prosthesis implantation: completed 7 days of rocephin 7. Leukocytosis: ? stress margination. resolved. 8. History of CVA: CT head without acute findings. No focal deficits at this time. Antiplatelets held secondary to GI bleed. 9. History of depression: Continue Seroquel. 10. Uncontrolled type II DM:  Fairly controlled. 11. Hypertension: increased beta blockers. 12. Altered mental status: Possibly secondary to symptomatic anemia and UTI, resolved 13. Recent penile implant and circumcision: No acute findings. Discussed by Dr Waymon Amato with Dr. Brunilda Payor on 2/20-sutures are self absorbable and okay to use condom catheter. Outpatient followup. 14. Mild hypernatremia and hyperchloremia: improved. 15. Lactic acidosis: Likely secondary to hypoperfusion from GI bleeding and anemia. Resolved. 16. Hypokalemia: IV replacement prior to surgery  DVT Prophylaxis: SCD's  Code Status: DNR  Disposition:    Subjective: Awaiting surgery, NPO no complaints of chest pain shortness of breath any fevers or chills  Objective: Weight change:   Intake/Output Summary (Last 24 hours) at 04/15/12 1126 Last data filed at 04/15/12 0750  Gross per 24 hour  Intake    615  ml  Output    450 ml  Net    165 ml   Blood pressure 150/70, pulse 53, temperature 98.6 F (37 C), temperature source Oral, resp. rate 16, height 5\' 6"  (1.676 m), weight 85.73 kg (189 lb), SpO2 100.00%.  Physical Exam: General: Alert and awake, oriented x3, NAD. CVS: S1-S2 clear, no murmur rubs or gallops Chest: CTAB Abdomen: soft NT, ND, NBS, mass in LUQ Extremities: no c//c/e bilaterally   Lab Results: Basic Metabolic Panel:  Recent Labs Lab 04/13/12 0624 04/14/12 0605 04/15/12 0618  NA  --  143 141  K  --  3.6 3.4*  CL  --  110 108  CO2  --  28 27  GLUCOSE  --  90 139*  BUN  --  9 10  CREATININE  --  1.00 1.01  CALCIUM  --  8.2* 8.0*  MG 2.0  --   --    Liver Function Tests:  Recent Labs Lab 04/11/12 0700  AST 28  ALT 32  ALKPHOS 87  BILITOT 0.2*  PROT 5.7*  ALBUMIN 2.0*   No results found for this basename: LIPASE, AMYLASE,  in the last 168 hours No results found for this basename: AMMONIA,  in the last 168 hours CBC:  Recent Labs Lab 04/14/12 0605 04/15/12 0618  WBC 9.1 9.2  HGB 10.2* 9.4*  HCT 31.3* 28.8*  MCV 89.9 90.0  PLT 338 299   Cardiac Enzymes: No results found for this basename: CKTOTAL, CKMB, CKMBINDEX, TROPONINI,  in the last 168 hours BNP: No components found with this basename: POCBNP,  CBG:  Recent Labs Lab 04/14/12 0822 04/14/12 1126 04/14/12 1651 04/14/12 2139 04/15/12 0739  GLUCAP 95 97 328* 270* 128*     Micro Results: Recent Results (from the past 240 hour(s))  MRSA PCR SCREENING     Status: None   Collection Time    04/05/12  6:35 PM      Result Value Range Status   MRSA by PCR NEGATIVE  NEGATIVE Final   Comment:            The GeneXpert MRSA Assay (FDA     approved for NASAL specimens     only), is one component of a     comprehensive MRSA colonization     surveillance program. It is not     intended to diagnose MRSA     infection nor to guide or     monitor treatment for     MRSA infections.  URINE  CULTURE     Status: None   Collection Time    04/05/12 11:06 PM      Result Value Range Status   Specimen Description URINE, CLEAN CATCH   Final   Special Requests NONE   Final   Culture  Setup Time 04/05/2012 23:47   Final   Colony Count 60,000 COLONIES/ML   Final   Culture SPHINGOMONAS PAUCIMOBILIS   Final   Report Status 04/08/2012 FINAL   Final   Organism ID, Bacteria SPHINGOMONAS PAUCIMOBILIS   Final    Studies/Results: Ct Abdomen Pelvis Wo Contrast  04/06/2012  *RADIOLOGY REPORT*  Clinical Data: Left upper quadrant mass on ultrasound  CT ABDOMEN AND PELVIS WITHOUT CONTRAST  Technique:  Multidetector CT imaging of the abdomen and pelvis was performed following the standard protocol without intravenous contrast.  Comparison: Ultrasound of the kidneys of 04/05/2012  Findings: The lung bases are clear.  There is moderate cardiomegaly present.  No pericardial effusion is seen.  The liver is unremarkable in the unenhanced state.  Faintly calcified gallstones are noted within the somewhat distended gallbladder and the gallbladder wall is slightly thickened as well and clinical correlation is recommended to exclude cholecystitis.  The pancreas appears somewhat atrophic and the pancreatic duct is not dilated. The adrenal glands are both slightly nodular most consistent with adrenal adenomas.  Metastatic involvement would be difficult to exclude.  The spleen is normal in size.  There is a large rounded soft tissue mass within the left upper quadrant as noted by ultrasound which appears to be intimately associated with the greater curvature of the stomach.  This mass measures approximately 11.1 x 12.7 x 13.2 cm and is somewhat inhomogeneous in attenuation on this unenhanced study.  The primary considerations are that of a gastrointestinal stromal tumor (G I S T tumor), gastric carcinoma or massive adenopathy as with lymphoma. Biopsy could be performed in view of the location of this mass.  The kidneys are  unremarkable in the unenhanced state.  No renal calculi are noted.  No adenopathy is seen.   The abdominal aorta is normal in caliber.  The urinary bladder is unremarkable.  A reservoir for penile prosthesis is noted just anterior to the urinary bladder.  The prostate is slightly prominent.  No abnormality of the colon is seen.  The appendix is unremarkable.  There are degenerative changes diffusely throughout the facet joints of the lower lumbar spine.  IMPRESSION:  1.  Large somewhat inhomogeneous rounded soft tissue mass in the left upper quadrant appears to be intimately associated with the greater curvature of the stomach and may represent a GIST tumor versus gastric carcinoma  carcinoma  carcinoma or adenopathy as with lymphoma.  Biopsy could be performed. 2.  Faintly calcified gallstones within a somewhat thick-walled gallbladder.  Rule out acute cholecystitis. 3.  Nodular adrenal glands.  Possibly adrenal adenomas but cannot exclude metastatic involvement.   Original Report Authenticated By: Dwyane Dee, [name]    Dg Chest 2 View  04/05/2012  *RADIOLOGY REPORT*  Clinical Data: Altered mental status.  CHEST - 2 VIEW  Comparison: 03/03/2012.  Findings: The cardiac silhouette, mediastinal and hilar contours are stable.  Stable surgical changes from bypass surgery.  No acute pulmonary findings.  No pleural effusion or pneumothorax.  The bony thorax is intact.  IMPRESSION: No acute cardiopulmonary findings.   Original Report Authenticated By: Rudie Meyer, [name]    Ct Head Wo Contrast  04/05/2012  *RADIOLOGY REPORT*  Clinical Data: Weakness and altered mental status.  CT HEAD WITHOUT CONTRAST  Technique:  Contiguous axial images were obtained from the base of the skull through the vertex without contrast.  Comparison: 08/22/2010.  Findings: Stable age related cerebral atrophy, ventriculomegaly and periventricular white matter disease.  No extra-axial fluid collections.  No CT findings for hemispheric infarction  and/or intracranial hemorrhage.  No mass lesions.  The brainstem and cerebellum are normal and stable. There is a remote right cerebellar hemispheric infarction.  The bony structures are intact.  No skull fracture.  Mild mucoperiosteal thickening in the left half of the sphenoid sinus and scattered ethmoid air cells.  The mastoid air cells are clear.  IMPRESSION:  1. Stable age related cerebral atrophy, ventriculomegaly and periventricular white matter disease. 2.  Remote right-sided cerebellar infarction. 3.  No acute intracranial findings or mass lesion.   Original Report Authenticated By: Rudie Meyer, [name]    US Renal  04/05/2012  *RADIOLOGY REPORT*  Clinical Data: Acute renal failure  RENAL/URINARY TRACT ULTRASOUND COMPLETE  Comparison:  03/10/2006 CT  Findings:  Right Kidney:  Measures 10.8 cm.  No hydronephrosis.  1.5 x 1.3 cm interpolar hypoechoic lesion is incompletely characterized.  Left Kidney:  Measures 10.5 cm.  No hydronephrosis or focal abnormality.  Bladder:  Not visualized.  The patient has a Foley catheter in place.  Incidental note is made of a mixed cystic and solid mass in the left upper quadrant.  IMPRESSION: No hydronephrosis.  Incompletely characterized 1.5 cm hypoechoic right renal lesion, favored to reflect a mildly complex cyst.  Incompletely characterized 15 cm left upper quadrant mass.  Contrast enhanced CT or MRI recommended to further evaluate the above findings.   Original Report Authenticated By: Jearld Lesch, [name]    US Biopsy  04/11/2012  *RADIOLOGY REPORT*  ULTRASOUND-GUIDED CORE BIOPSY  Date: 04/11/2012  Clinical History: 73 year old male with areas of recently diagnosed left upper quadrant mass appears pedunculated and exophytic from the greater curvature of the stomach.  He presents for ultrasound- guided biopsy to facilitate tissue diagnosis.  Procedures Performed: 1. Ultrasound-guided core biopsy  Interventional Radiologist:  [name], MD  Sedation:  Moderate (conscious) sedation was used.  One mg Versed, 50 mcg Fentanyl were administered intravenously.  The patient's vital signs were monitored continuously by radiology nursing throughout the procedure.  Sedation Time: 10 minutes  Fluoroscopy time: None  Contrast volume: None  PROCEDURE/FINDINGS:   Informed consent was obtained from the patient following explanation of the procedure, risks, benefits and alternatives. The patient understands, agrees and consents for the procedure. All questions were addressed. A time out was performed.  Maximal barrier sterile technique utilized including  caps, mask, sterile gowns, sterile gloves, large sterile drape, hand hygiene, and betadine skin prep.  The left upper quadrant was interrogated with ultrasound.  There is a large complex cystic and solid mass in the left upper quadrant which is inseparable from the stomach.  The internal cystic area likely represent areas of internal necrosis. A suitable skin entry site was selected marked.  Local anesthesia was obtained by infiltration of 1% lidocaine.  Under direct sonographic guidance, a 17 gauge trocar needle was advanced through the abdominal wall and into the mass.  Several 18-gauge core biopsies were then coaxially obtained from the solid portion of the mass.  Biopsies were placed in formalin.  As the 17 gauge outer cannula was removed, the biopsy tract was embolized with a Gelfoam slurry.  Post biopsy imaging demonstrated no active hemorrhage or perilesional hematoma.  The patient tolerated the procedure well.  No immediate complication.  IMPRESSION:  Technically successful ultrasound guided core biopsy of left upper quadrant mass lesion which is likely arising from the gastric wall.  Signed,  [name], MD Vascular & Interventional Radiologist [facility]   Original Report Authenticated By: [name], [name]     Medications: Scheduled Meds: . atorvastatin  40 mg Oral Daily  .  ceFAZolin  (ANCEF) IV  2 g Intravenous On Call to OR  . cholecalciferol  5,000 Units Oral Daily  . docusate sodium  100 mg Oral BID  . feeding supplement  1 Container Oral TID BM  . insulin aspart  0-5 Units Subcutaneous QHS  . insulin aspart  0-9 Units Subcutaneous TID WC  . insulin glargine  20 Units Subcutaneous QHS  . metoprolol tartrate  25 mg Oral BID  . pantoprazole  40 mg Oral Daily  . polyethylene glycol  17 g Oral Daily  . potassium chloride  10 mEq Intravenous Once  . QUEtiapine  200 mg Oral QHS  . tamsulosin  0.4 mg Oral Daily      LOS: 10 days   RAI,RIPUDEEP [name] [facility] 04/15/2012, 11:26 AM Pager: [phone]  If 7PM-7AM, please contact night-coverage [email or web address] Password [email or web address]

## 2012-04-16 LAB — BASIC METABOLIC PANEL
BUN: 13 mg/dL (ref 6–23)
Calcium: 8 mg/dL — ABNORMAL LOW (ref 8.4–10.5)
Creatinine, Ser: 1.1 mg/dL (ref 0.50–1.35)
GFR calc Af Amer: 77 mL/min — ABNORMAL LOW (ref >90)
GFR calc non Af Amer: 66 mL/min — ABNORMAL LOW (ref >90)

## 2012-04-16 LAB — CBC
HCT: 28.7 % — ABNORMAL LOW (ref 39.0–52.0)
MCHC: 32.8 g/dL (ref 30.0–36.0)
MCV: 89.7 fL (ref 78.0–100.0)
Platelets: 300 10*3/uL (ref 150–400)
RDW: 15.3 % (ref 11.5–15.5)

## 2012-04-16 LAB — GLUCOSE, CAPILLARY: Glucose-Capillary: 125 mg/dL — ABNORMAL HIGH (ref 70–99)

## 2012-04-16 MED ORDER — ONDANSETRON HCL 4 MG/2ML IJ SOLN: 4.0000 mg | Freq: Four times a day (QID) | INTRAMUSCULAR | Status: DC | PRN

## 2012-04-16 MED ORDER — SODIUM CHLORIDE 0.9 % IJ SOLN: 9.0000 mL | INTRAMUSCULAR | Status: DC | PRN

## 2012-04-16 MED ORDER — NALOXONE HCL 0.4 MG/ML IJ SOLN: 0.4000 mg | INTRAMUSCULAR | Status: DC | PRN

## 2012-04-16 MED ORDER — MORPHINE SULFATE (PF) 1 MG/ML IV SOLN
INTRAVENOUS | Status: AC
  Filled 2012-04-16: qty 25

## 2012-04-16 MED ORDER — DIPHENHYDRAMINE HCL 12.5 MG/5ML PO ELIX: 12.5000 mg | ORAL_SOLUTION | Freq: Four times a day (QID) | ORAL | Status: DC | PRN

## 2012-04-16 MED ORDER — DIPHENHYDRAMINE HCL 50 MG/ML IJ SOLN
12.5000 mg | Freq: Four times a day (QID) | INTRAMUSCULAR | Status: DC | PRN
  Administered 2012-04-16 – 2012-04-17 (×4): 12.5 mg via INTRAVENOUS
  Filled 2012-04-16 (×2): qty 1

## 2012-04-16 MED ORDER — DIPHENHYDRAMINE HCL 50 MG/ML IJ SOLN: 12.5000 mg | Freq: Four times a day (QID) | INTRAMUSCULAR | Status: DC | PRN

## 2012-04-16 MED ORDER — SODIUM CHLORIDE 0.9 % IV SOLN
Freq: Once | INTRAVENOUS | Status: AC
  Administered 2012-04-16: 15:00:00 via INTRAVENOUS

## 2012-04-16 MED ORDER — DIPHENHYDRAMINE HCL 50 MG/ML IJ SOLN
12.5000 mg | Freq: Four times a day (QID) | INTRAMUSCULAR | Status: DC | PRN
  Filled 2012-04-16 (×2): qty 1

## 2012-04-16 MED ORDER — MORPHINE SULFATE (PF) 1 MG/ML IV SOLN
INTRAVENOUS | Status: DC
  Administered 2012-04-17: 1.5 mg via INTRAVENOUS
  Administered 2012-04-17: 4 mg via INTRAVENOUS
  Administered 2012-04-17: 6 mg via INTRAVENOUS

## 2012-04-16 MED ORDER — DIPHENHYDRAMINE HCL 12.5 MG/5ML PO ELIX
12.5000 mg | ORAL_SOLUTION | Freq: Four times a day (QID) | ORAL | Status: DC | PRN
  Filled 2012-04-16: qty 5

## 2012-04-16 NOTE — Progress Notes (Signed)
Patient ID: [name]  male  ZOX:[id number]    DOB: 01/11/1941    DOA: 04/05/2012  PCP: [name] Plum, MD  Assessment/Plan:  1. Subacute upper GI bleeding: negative EGD on 2/19. Resolved - [facility] following, EGD and C-scope unrevealing.  2. 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 Acute blood loss anemia: S/p 8 units of  PRBCs during hospitalization 4. Atrial fibrillation: in sinus rhythm. Antiplatelets held second GI bleeding.  5. Acute on CKD: ARF resolved.  6. Spingomonas Paucimobilis UTI: recent urinary tract manipulation/penile prosthesis implantation: completed 7 days of rocephin 7. Leukocytosis: ? stress margination. resolved. 8. History of CVA: CT head without acute findings. No focal deficits at this time. Antiplatelets held secondary to GI bleed. 9. History of depression: Continue Seroquel. 10. Uncontrolled type II DM:  Fairly controlled. 11. Altered mental status: Possibly secondary to symptomatic anemia and UTI, resolved 12. Recent penile implant and circumcision: No acute findings. Discussed by Dr Waymon Amato with Dr. Brunilda Payor on 2/20-sutures are self absorbable and okay to use condom catheter. Outpatient followup. 13. Mild hypernatremia and hyperchloremia: improved. 14. Lactic acidosis: Likely secondary to hypoperfusion from GI bleeding and anemia. Resolved. 15. Hypokalemia: replete and follow   DVT Prophylaxis: SCD's  Code Status: DNR  Disposition:    Subjective: Some pain,   Objective: Weight change:   Intake/Output Summary (Last 24 hours) at 04/16/12 0853 Last data filed at 04/16/12 0700  Gross per 24 hour  Intake 4508.33 ml  Output   1620 ml  Net 2888.33 ml   Blood pressure 152/60, pulse 69, temperature 97.9 F (36.6 C), temperature source Oral, resp. rate 18, height 5\' 6"  (1.676 m), weight 85.73 kg (189  lb), SpO2 98.00%. Patient Vitals for the past 24 hrs:  BP Temp Temp src Pulse Resp SpO2  04/16/12 0814 - - - - 18 98 %  04/16/12 0549 152/60 mmHg 97.9 F (36.6 C) Oral 69 16 100 %  04/16/12 0217 125/54 mmHg 97.9 F (36.6 C) Oral 70 16 100 %  04/16/12 0020 - - - - 14 100 %  04/15/12 2151 136/63 mmHg 97.6 F (36.4 C) Oral 75 15 100 %  04/15/12 1958 - - - - 14 100 %  04/15/12 1742 137/61 mmHg 97.8 F (36.6 C) Axillary 62 27 99 %  04/15/12 1715 125/72 mmHg 97.6 F (36.4 C) - 57 25 100 %  04/15/12 1700 127/53 mmHg - - 56 12 95 %  04/15/12 1645 119/52 mmHg - - 55 14 97 %  04/15/12 1630 132/52 mmHg - - 58 16 100 %  04/15/12 1626 - - - - 18 100 %  04/15/12 1615 136/66 mmHg - - 57 19 100 %  04/15/12 1600 133/55 mmHg 96.6 F (35.9 C) - 59 28 100 %  04/15/12 1054 150/70 mmHg 98.6 F (37 C) Oral 53 - 100 %    Physical Exam: General: Alert and awake, oriented x3, NAD. CVS: S1-S2 clear, no murmur rubs or gallops Chest: CTAB Extremities: no c//c/e bilaterally NG tube with dark material coming out   Lab Results: Basic Metabolic Panel:  Recent Labs Lab 04/13/12 0624  04/15/12 0618 04/16/12 0627  NA  --   < > 141 142  K  --   < > 3.4* 4.4  CL  --   < > 108 110  CO2  --   < > 27 24  GLUCOSE  --   < > 139* 172*  BUN  --   < > 10 13  CREATININE  --   < > 1.01 1.10  CALCIUM  --   < > 8.0* 8.0*  MG 2.0  --   --   --   < > = values in this interval not displayed. Liver Function Tests:  Recent Labs Lab 04/11/12 0700  AST 28  ALT 32  ALKPHOS 87  BILITOT 0.2*  PROT 5.7*  ALBUMIN 2.0*   No results found for this basename: LIPASE, AMYLASE,  in the last 168 hours No results found for this basename: AMMONIA,  in the last 168 hours CBC:  Recent Labs Lab 04/15/12 1648 04/16/12 0627  WBC 20.4* 16.7*  HGB 10.1* 9.4*  HCT 30.6* 28.7*  MCV 89.5 89.7  PLT 302 300   Cardiac Enzymes: No results found for this basename: CKTOTAL, CKMB, CKMBINDEX, TROPONINI,  in the last 168  hours BNP: No components found with this basename: POCBNP,  CBG:  Recent Labs Lab 04/15/12 1252 04/15/12 1608 04/15/12 2129 04/15/12 2345 04/16/12 0358  GLUCAP 90 148* 198* 195* 154*     Micro Results: Recent Results (from the past 240 hour(s))  SURGICAL PCR SCREEN     Status: None   Collection Time    04/15/12 10:28 AM      Result Value Range Status   MRSA, PCR NEGATIVE  NEGATIVE Final   Staphylococcus aureus NEGATIVE  NEGATIVE Final   Comment:            The Xpert SA Assay (FDA     approved for NASAL specimens     in patients over 55 years of age),     is one component of     a comprehensive surveillance     program.  Test performance has     been validated by [employer] for patients greater     than or equal to 59 year old.     It is not intended     to diagnose infection nor to     guide or monitor treatment.    Studies/Results: Ct Abdomen Pelvis Wo Contrast  04/06/2012  *RADIOLOGY REPORT*  Clinical Data: Left upper quadrant mass on ultrasound  CT ABDOMEN AND PELVIS WITHOUT CONTRAST  Technique:  Multidetector CT imaging of the abdomen and pelvis was performed following the standard protocol without intravenous contrast.  Comparison: Ultrasound of the kidneys of 04/05/2012  Findings: The lung bases are clear.  There is moderate cardiomegaly present.  No pericardial effusion is seen.  The liver is unremarkable in the unenhanced state.  Faintly calcified gallstones are noted within the somewhat distended gallbladder and the gallbladder wall is slightly thickened as well and clinical correlation is recommended to exclude cholecystitis.  The pancreas appears somewhat atrophic and the pancreatic duct is not dilated. The adrenal glands are both slightly nodular most consistent with adrenal adenomas.  Metastatic involvement would be difficult to exclude.  The spleen is normal in size.  There is a large rounded soft tissue mass within the left upper quadrant as noted by  ultrasound which appears to be intimately associated with the greater curvature of the stomach.  This mass measures approximately 11.1 x 12.7 x 13.2 cm and is somewhat inhomogeneous in attenuation on this unenhanced study.  The primary considerations are that of a gastrointestinal stromal tumor (G I  S T tumor), gastric carcinoma or massive adenopathy as with lymphoma. Biopsy could be performed in view of the location of this mass.  The kidneys are unremarkable in the unenhanced state.  No renal calculi are noted.  No adenopathy is seen.   The abdominal aorta is normal in caliber.  The urinary bladder is unremarkable.  A reservoir for penile prosthesis is noted just anterior to the urinary bladder.  The prostate is slightly prominent.  No abnormality of the colon is seen.  The appendix is unremarkable.  There are degenerative changes diffusely throughout the facet joints of the lower lumbar spine.  IMPRESSION:  1.  Large somewhat inhomogeneous rounded soft tissue mass in the left upper quadrant appears to be intimately associated with the greater curvature of the stomach and may represent a GIST tumor versus gastric carcinoma  carcinoma  carcinoma or adenopathy as with lymphoma.  Biopsy could be performed. 2.  Faintly calcified gallstones within a somewhat thick-walled gallbladder.  Rule out acute cholecystitis. 3.  Nodular adrenal glands.  Possibly adrenal adenomas but cannot exclude metastatic involvement.   Original Report Authenticated By: Dwyane Dee, [name]    Dg Chest 2 View  04/05/2012  *RADIOLOGY REPORT*  Clinical Data: Altered mental status.  CHEST - 2 VIEW  Comparison: 03/03/2012.  Findings: The cardiac silhouette, mediastinal and hilar contours are stable.  Stable surgical changes from bypass surgery.  No acute pulmonary findings.  No pleural effusion or pneumothorax.  The bony thorax is intact.  IMPRESSION: No acute cardiopulmonary findings.   Original Report Authenticated By: Rudie Meyer, [name]    Ct  Head Wo Contrast  04/05/2012  *RADIOLOGY REPORT*  Clinical Data: Weakness and altered mental status.  CT HEAD WITHOUT CONTRAST  Technique:  Contiguous axial images were obtained from the base of the skull through the vertex without contrast.  Comparison: 08/22/2010.  Findings: Stable age related cerebral atrophy, ventriculomegaly and periventricular white matter disease.  No extra-axial fluid collections.  No CT findings for hemispheric infarction and/or intracranial hemorrhage.  No mass lesions.  The brainstem and cerebellum are normal and stable. There is a remote right cerebellar hemispheric infarction.  The bony structures are intact.  No skull fracture.  Mild mucoperiosteal thickening in the left half of the sphenoid sinus and scattered ethmoid air cells.  The mastoid air cells are clear.  IMPRESSION:  1. Stable age related cerebral atrophy, ventriculomegaly and periventricular white matter disease. 2.  Remote right-sided cerebellar infarction. 3.  No acute intracranial findings or mass lesion.   Original Report Authenticated By: Rudie Meyer, [name]    US Renal  04/05/2012  *RADIOLOGY REPORT*  Clinical Data: Acute renal failure  RENAL/URINARY TRACT ULTRASOUND COMPLETE  Comparison:  03/10/2006 CT  Findings:  Right Kidney:  Measures 10.8 cm.  No hydronephrosis.  1.5 x 1.3 cm interpolar hypoechoic lesion is incompletely characterized.  Left Kidney:  Measures 10.5 cm.  No hydronephrosis or focal abnormality.  Bladder:  Not visualized.  The patient has a Foley catheter in place.  Incidental note is made of a mixed cystic and solid mass in the left upper quadrant.  IMPRESSION: No hydronephrosis.  Incompletely characterized 1.5 cm hypoechoic right renal lesion, favored to reflect a mildly complex cyst.  Incompletely characterized 15 cm left upper quadrant mass.  Contrast enhanced CT or MRI recommended to further evaluate the above findings.   Original Report Authenticated By: Jearld Lesch, [name]    US  Biopsy  04/11/2012  *RADIOLOGY REPORT*  ULTRASOUND-GUIDED  CORE BIOPSY  Date: 04/11/2012  Clinical History: 72 year old male with areas of recently diagnosed left upper quadrant mass appears pedunculated and exophytic from the greater curvature of the stomach.  He presents for ultrasound- guided biopsy to facilitate tissue diagnosis.  Procedures Performed: 1. Ultrasound-guided core biopsy  Interventional Radiologist:  [name], MD  Sedation: Moderate (conscious) sedation was used.  One mg Versed, 50 mcg Fentanyl were administered intravenously.  The patient's vital signs were monitored continuously by radiology nursing throughout the procedure.  Sedation Time: 10 minutes  Fluoroscopy time: None  Contrast volume: None  PROCEDURE/FINDINGS:   Informed consent was obtained from the patient following explanation of the procedure, risks, benefits and alternatives. The patient understands, agrees and consents for the procedure. All questions were addressed. A time out was performed.  Maximal barrier sterile technique utilized including caps, mask, sterile gowns, sterile gloves, large sterile drape, hand hygiene, and betadine skin prep.  The left upper quadrant was interrogated with ultrasound.  There is a large complex cystic and solid mass in the left upper quadrant which is inseparable from the stomach.  The internal cystic area likely represent areas of internal necrosis. A suitable skin entry site was selected marked.  Local anesthesia was obtained by infiltration of 1% lidocaine.  Under direct sonographic guidance, a 17 gauge trocar needle was advanced through the abdominal wall and into the mass.  Several 18-gauge core biopsies were then coaxially obtained from the solid portion of the mass.  Biopsies were placed in formalin.  As the 17 gauge outer cannula was removed, the biopsy tract was embolized with a Gelfoam slurry.  Post biopsy imaging demonstrated no active hemorrhage or perilesional hematoma.  The  patient tolerated the procedure well.  No immediate complication.  IMPRESSION:  Technically successful ultrasound guided core biopsy of left upper quadrant mass lesion which is likely arising from the gastric wall.  Signed,  [name], MD Vascular & Interventional Radiologist [facility]   Original Report Authenticated By: [name], [name]     Medications: Scheduled Meds: . enoxaparin  40 mg Subcutaneous Q24H  . HYDROmorphone PCA 0.3 mg/mL   Intravenous Q4H  . insulin aspart  0-9 Units Subcutaneous Q4H      LOS: 11 days   Minas Bonser [name]  04/16/2012, 8:53 AM Pager: [phone]  If 7PM-7AM, please contact night-coverage [email or web address] Password [email or web address]

## 2012-04-16 NOTE — Progress Notes (Signed)
1 Day Post-Op  Subjective: Sore, using PCA, has a lot of questions about the surgery which I answered as I assisted Dr. Lindie Spruce  Objective: Vital signs in last 24 hours: Temp:  [96.6 F (35.9 C)-98.6 F (37 C)] 97.9 F (36.6 C) (03/01 0549) Pulse Rate:  [53-75] 69 (03/01 0549) Resp:  [12-28] 18 (03/01 0814) BP: (119-152)/(52-72) 152/60 mmHg (03/01 0549) SpO2:  [95 %-100 %] 98 % (03/01 0814) FiO2 (%):  [100 %] 100 % (03/01 0351) Last BM Date: 04/14/12  Intake/Output from previous day: 02/28 0701 - 03/01 0700 In: 4628.3 [P.O.:120; I.V.:3758.3; Blood:350; IV Piggyback:400] Out: 2070 [Urine:1070; Emesis/NG output:50; Blood:900] Intake/Output this shift:    General appearance: alert and cooperative Nose: NGT Resp: clear to auscultation bilaterally Cardio: regular rate and rhythm GI: dressing intact with iodine ointment staining, quiet, no significant tenderness Ext: calves soft Cardio correction: irregularly irregular Lab Results:   Recent Labs  04/15/12 1648 04/16/12 0627  WBC 20.4* 16.7*  HGB 10.1* 9.4*  HCT 30.6* 28.7*  PLT 302 300   BMET  Recent Labs  04/15/12 0618 04/16/12 0627  NA 141 142  K 3.4* 4.4  CL 108 110  CO2 27 24  GLUCOSE 139* 172*  BUN 10 13  CREATININE 1.01 1.10  CALCIUM 8.0* 8.0*   PT/INR No results found for this basename: LABPROT, INR,  in the last 72 hours ABG No results found for this basename: PHART, PCO2, PO2, HCO3,  in the last 72 hours  Studies/Results: No results found.  Anti-infectives: Anti-infectives   Start     Dose/Rate Route Frequency Ordered Stop   04/15/12 1930  ceFAZolin (ANCEF) IVPB 1 g/50 mL premix     1 g 100 mL/hr over 30 Minutes Intravenous Every 6 hours 04/15/12 1800 04/16/12 0715   04/15/12 0800  [MAR Hold]  ceFAZolin (ANCEF) IVPB 2 g/50 mL premix     (On MAR Hold since 04/15/12 1228)   2 g 100 mL/hr over 30 Minutes Intravenous On call to O.R. 04/15/12 0752 04/15/12 1315   04/08/12 0845  cefTRIAXone  (ROCEPHIN) 1 g in dextrose 5 % 50 mL IVPB  Status:  Discontinued     1 g 100 mL/hr over 30 Minutes Intravenous Every 24 hours 04/08/12 0832 04/12/12 1402   04/06/12 1800  cefTRIAXone (ROCEPHIN) 1 g in dextrose 5 % 50 mL IVPB  Status:  Discontinued     1 g 100 mL/hr over 30 Minutes Intravenous Every 24 hours 04/06/12 1504 04/07/12 1515   04/05/12 1615  cefTRIAXone (ROCEPHIN) 2 g in dextrose 5 % 50 mL IVPB  Status:  Discontinued     2 g 100 mL/hr over 30 Minutes Intravenous Every 24 hours 04/05/12 1600 04/06/12 1504      Assessment/Plan: s/p Procedure(s): Partial Gastrectomy (N/A) S/P partial gastrectomy with resection large GIST POD#1 - await bowel function, continue NGT ABL anemia - F/U FEN - lytes OK Mult medical problems - per primary service  LOS: 11 days    Aasha Dina E 04/16/2012

## 2012-04-17 LAB — BASIC METABOLIC PANEL
BUN: 9 mg/dL (ref 6–23)
CO2: 26 mEq/L (ref 19–32)
Calcium: 7.9 mg/dL — ABNORMAL LOW (ref 8.4–10.5)
Chloride: 113 mEq/L — ABNORMAL HIGH (ref 96–112)
Creatinine, Ser: 1.04 mg/dL (ref 0.50–1.35)
GFR calc Af Amer: 82 mL/min — ABNORMAL LOW (ref >90)
GFR calc non Af Amer: 71 mL/min — ABNORMAL LOW (ref >90)
Glucose, Bld: 128 mg/dL — ABNORMAL HIGH (ref 70–99)
Potassium: 4.5 mEq/L (ref 3.5–5.1)
Sodium: 146 mEq/L — ABNORMAL HIGH (ref 135–145)

## 2012-04-17 LAB — CBC
HCT: 25.1 % — ABNORMAL LOW (ref 39.0–52.0)
Hemoglobin: 8.2 g/dL — ABNORMAL LOW (ref 13.0–17.0)
MCH: 29.7 pg (ref 26.0–34.0)
MCHC: 32.7 g/dL (ref 30.0–36.0)
MCV: 90.9 fL (ref 78.0–100.0)
Platelets: 257 10*3/uL (ref 150–400)
RBC: 2.76 MIL/uL — ABNORMAL LOW (ref 4.22–5.81)
RDW: 15.6 % — ABNORMAL HIGH (ref 11.5–15.5)
WBC: 14.2 10*3/uL — ABNORMAL HIGH (ref 4.0–10.5)

## 2012-04-17 LAB — GLUCOSE, CAPILLARY
Glucose-Capillary: 121 mg/dL — ABNORMAL HIGH (ref 70–99)
Glucose-Capillary: 123 mg/dL — ABNORMAL HIGH (ref 70–99)

## 2012-04-17 MED ORDER — ACETAMINOPHEN 10 MG/ML IV SOLN
1000.0000 mg | Freq: Four times a day (QID) | INTRAVENOUS | Status: DC
  Administered 2012-04-17 – 2012-04-18 (×3): 1000 mg via INTRAVENOUS
  Filled 2012-04-17 (×5): qty 100

## 2012-04-17 MED ORDER — PANTOPRAZOLE SODIUM 40 MG IV SOLR
40.0000 mg | INTRAVENOUS | Status: DC
  Administered 2012-04-17 – 2012-04-19 (×3): 40 mg via INTRAVENOUS
  Filled 2012-04-17 (×3): qty 40

## 2012-04-17 NOTE — Progress Notes (Signed)
Patient ID: [name]  male  ZOX:[id number]    DOB: 05-Oct-1940    DOA: 04/05/2012  PCP: [name], MD  Assessment/Plan:  1. Subacute upper GI bleeding: negative EGD on 2/19. Resolved - [facility] following, EGD and C-scope unrevealing.  2. 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 Acute blood loss anemia: S/p 8 units of  PRBCs during hospitalization 4. Atrial fibrillation: in sinus rhythm. Antiplatelets held second GI bleeding.  5. Acute on CKD: ARF resolved.  6. Spingomonas Paucimobilis UTI: recent urinary tract manipulation/penile prosthesis implantation: completed 7 days of rocephin 7. Leukocytosis: ? stress margination. resolved. 8. History of CVA: CT head without acute findings. No focal deficits at this time. Antiplatelets held secondary to GI bleed. 9. History of depression: Continue Seroquel. 10. Uncontrolled type II DM:  Fairly controlled. 11. Altered mental status: Possibly secondary to symptomatic anemia and UTI, resolved 12. Recent penile implant and circumcision: No acute findings. Discussed by Dr Waymon Amato with Dr. Brunilda Payor on 2/20-sutures are self absorbable and okay to use condom catheter. Outpatient followup. 13. Mild hypernatremia and hyperchloremia: improved. 14. Lactic acidosis: Likely secondary to hypoperfusion from GI bleeding and anemia. Resolved. 15. Hypokalemia: replete and follow   DVT Prophylaxis: SCD's  Code Status: DNR  Disposition: ? SNF     Subjective: Anxious   Objective: Weight change:   Intake/Output Summary (Last 24 hours) at 04/17/12 1741 Last data filed at 04/17/12 1423  Gross per 24 hour  Intake      0 ml  Output   2325 ml  Net  -2325 ml   Blood pressure 155/62, pulse 67, temperature 97.9 F (36.6 C), temperature source Oral, resp. rate 18, height 5\' 6"  (1.676 m), weight 85.73 kg (189  lb), SpO2 98.00%. Patient Vitals for the past 24 hrs:  BP Temp Temp src Pulse Resp SpO2  04/17/12 1643 - - - - 18 98 %  04/17/12 1421 155/62 mmHg 97.9 F (36.6 C) Oral 67 20 99 %  04/17/12 1144 - - - - 18 98 %  04/17/12 1026 166/45 mmHg 97.8 F (36.6 C) Oral 68 14 98 %  04/17/12 0831 - - - - 18 100 %  04/17/12 0547 153/66 mmHg 97.5 F (36.4 C) Oral 70 18 100 %  04/17/12 0244 161/63 mmHg 99.5 F (37.5 C) Oral 89 18 100 %  04/17/12 0055 - - - - 16 96 %  04/16/12 2300 - - - - 22 100 %  04/16/12 2141 165/68 mmHg 99.1 F (37.3 C) Oral 76 16 96 %  04/16/12 2056 - - - - 21 96 %  04/16/12 1900 157/64 mmHg 98.3 F (36.8 C) Oral 73 16 95 %    Physical Exam: General: Alert and awake, oriented x3, NAD. CVS: S1-S2 clear, no murmur rubs or gallops Chest: CTAB Extremities: no c//c/e bilaterally NG tube with dark material coming out   Lab Results: Basic Metabolic Panel:  Recent Labs Lab 04/13/12 0624  04/16/12 0627 04/17/12 0658  NA  --   < > 142 146*  K  --   < > 4.4 4.5  CL  --   < > 110 113*  CO2  --   < > 24 26  GLUCOSE  --   < > 172* 128*  BUN  --   < > 13 9  CREATININE  --   < > 1.10 1.04  CALCIUM  --   < > 8.0* 7.9*  MG 2.0  --   --   --   < > = values in this interval not displayed. Liver Function Tests:  Recent Labs Lab 04/11/12 0700  AST 28  ALT 32  ALKPHOS 87  BILITOT 0.2*  PROT 5.7*  ALBUMIN 2.0*   No results found for this basename: LIPASE, AMYLASE,  in the last 168 hours No results found for this basename: AMMONIA,  in the last 168 hours CBC:  Recent Labs Lab 04/16/12 0627 04/17/12 0658  WBC 16.7* 14.2*  HGB 9.4* 8.2*  HCT 28.7* 25.1*  MCV 89.7 90.9  PLT 300 257   Cardiac Enzymes: No results found for this basename: CKTOTAL, CKMB, CKMBINDEX, TROPONINI,  in the last 168 hours BNP: No components found with this basename: POCBNP,  CBG:  Recent Labs Lab 04/17/12 0003 04/17/12 0403 04/17/12 0748 04/17/12 1227 04/17/12 1623  GLUCAP 114*  127* 123* 121* 124*     Micro Results: Recent Results (from the past 240 hour(s))  SURGICAL PCR SCREEN     Status: None   Collection Time    04/15/12 10:28 AM      Result Value Range Status   MRSA, PCR NEGATIVE  NEGATIVE Final   Staphylococcus aureus NEGATIVE  NEGATIVE Final   Comment:            The Xpert SA Assay (FDA     approved for NASAL specimens     in patients over 90 years of age),     is one component of     a comprehensive surveillance     program.  Test performance has     been validated by [employer] for patients greater     than or equal to 72 year old.     It is not intended     to diagnose infection nor to     guide or monitor treatment.    Studies/Results: Ct Abdomen Pelvis Wo Contrast  04/06/2012  *RADIOLOGY REPORT*  Clinical Data: Left upper quadrant mass on ultrasound  CT ABDOMEN AND PELVIS WITHOUT CONTRAST  Technique:  Multidetector CT imaging of the abdomen and pelvis was performed following the standard protocol without intravenous contrast.  Comparison: Ultrasound of the kidneys of 04/05/2012  Findings: The lung bases are clear.  There is moderate cardiomegaly present.  No pericardial effusion is seen.  The liver is unremarkable in the unenhanced state.  Faintly calcified gallstones are noted within the somewhat distended gallbladder and the gallbladder wall is slightly thickened as well and clinical correlation is recommended to exclude cholecystitis.  The pancreas appears somewhat atrophic and the pancreatic duct is not dilated. The adrenal glands are both slightly nodular most consistent with adrenal adenomas.  Metastatic involvement would be difficult to exclude.  The spleen is normal in size.  There is a large rounded soft tissue mass within the left upper quadrant as noted by ultrasound which appears to be intimately associated with the greater curvature of the stomach.  This mass measures approximately 11.1 x 12.7 x 13.2 cm and is somewhat  inhomogeneous in attenuation on this unenhanced study.  The primary considerations are that of a gastrointestinal stromal tumor (G I S T tumor), gastric carcinoma or massive adenopathy as with lymphoma. Biopsy could be performed in view of the location of this mass.  The kidneys are unremarkable in the  unenhanced state.  No renal calculi are noted.  No adenopathy is seen.   The abdominal aorta is normal in caliber.  The urinary bladder is unremarkable.  A reservoir for penile prosthesis is noted just anterior to the urinary bladder.  The prostate is slightly prominent.  No abnormality of the colon is seen.  The appendix is unremarkable.  There are degenerative changes diffusely throughout the facet joints of the lower lumbar spine.  IMPRESSION:  1.  Large somewhat inhomogeneous rounded soft tissue mass in the left upper quadrant appears to be intimately associated with the greater curvature of the stomach and may represent a GIST tumor versus gastric carcinoma  carcinoma  carcinoma or adenopathy as with lymphoma.  Biopsy could be performed. 2.  Faintly calcified gallstones within a somewhat thick-walled gallbladder.  Rule out acute cholecystitis. 3.  Nodular adrenal glands.  Possibly adrenal adenomas but cannot exclude metastatic involvement.   Original Report Authenticated By: Dwyane Dee, [name]    Dg Chest 2 View  04/05/2012  *RADIOLOGY REPORT*  Clinical Data: Altered mental status.  CHEST - 2 VIEW  Comparison: 03/03/2012.  Findings: The cardiac silhouette, mediastinal and hilar contours are stable.  Stable surgical changes from bypass surgery.  No acute pulmonary findings.  No pleural effusion or pneumothorax.  The bony thorax is intact.  IMPRESSION: No acute cardiopulmonary findings.   Original Report Authenticated By: Rudie Meyer, [name]    Ct Head Wo Contrast  04/05/2012  *RADIOLOGY REPORT*  Clinical Data: Weakness and altered mental status.  CT HEAD WITHOUT CONTRAST  Technique:  Contiguous axial images  were obtained from the base of the skull through the vertex without contrast.  Comparison: 08/22/2010.  Findings: Stable age related cerebral atrophy, ventriculomegaly and periventricular white matter disease.  No extra-axial fluid collections.  No CT findings for hemispheric infarction and/or intracranial hemorrhage.  No mass lesions.  The brainstem and cerebellum are normal and stable. There is a remote right cerebellar hemispheric infarction.  The bony structures are intact.  No skull fracture.  Mild mucoperiosteal thickening in the left half of the sphenoid sinus and scattered ethmoid air cells.  The mastoid air cells are clear.  IMPRESSION:  1. Stable age related cerebral atrophy, ventriculomegaly and periventricular white matter disease. 2.  Remote right-sided cerebellar infarction. 3.  No acute intracranial findings or mass lesion.   Original Report Authenticated By: Rudie Meyer, [name]    US Renal  04/05/2012  *RADIOLOGY REPORT*  Clinical Data: Acute renal failure  RENAL/URINARY TRACT ULTRASOUND COMPLETE  Comparison:  03/10/2006 CT  Findings:  Right Kidney:  Measures 10.8 cm.  No hydronephrosis.  1.5 x 1.3 cm interpolar hypoechoic lesion is incompletely characterized.  Left Kidney:  Measures 10.5 cm.  No hydronephrosis or focal abnormality.  Bladder:  Not visualized.  The patient has a Foley catheter in place.  Incidental note is made of a mixed cystic and solid mass in the left upper quadrant.  IMPRESSION: No hydronephrosis.  Incompletely characterized 1.5 cm hypoechoic right renal lesion, favored to reflect a mildly complex cyst.  Incompletely characterized 15 cm left upper quadrant mass.  Contrast enhanced CT or MRI recommended to further evaluate the above findings.   Original Report Authenticated By: Jearld Lesch, [name]    US Biopsy  04/11/2012  *RADIOLOGY REPORT*  ULTRASOUND-GUIDED CORE BIOPSY  Date: 04/11/2012  Clinical History: 72 year old male with areas of recently diagnosed left upper  quadrant mass appears pedunculated and exophytic from the greater curvature of the stomach.  He presents for ultrasound- guided biopsy to facilitate tissue diagnosis.  Procedures Performed: 1. Ultrasound-guided core biopsy  Interventional Radiologist:  [name], MD  Sedation: Moderate (conscious) sedation was used.  One mg Versed, 50 mcg Fentanyl were administered intravenously.  The patient's vital signs were monitored continuously by radiology nursing throughout the procedure.  Sedation Time: 10 minutes  Fluoroscopy time: None  Contrast volume: None  PROCEDURE/FINDINGS:   Informed consent was obtained from the patient following explanation of the procedure, risks, benefits and alternatives. The patient understands, agrees and consents for the procedure. All questions were addressed. A time out was performed.  Maximal barrier sterile technique utilized including caps, mask, sterile gowns, sterile gloves, large sterile drape, hand hygiene, and betadine skin prep.  The left upper quadrant was interrogated with ultrasound.  There is a large complex cystic and solid mass in the left upper quadrant which is inseparable from the stomach.  The internal cystic area likely represent areas of internal necrosis. A suitable skin entry site was selected marked.  Local anesthesia was obtained by infiltration of 1% lidocaine.  Under direct sonographic guidance, a 17 gauge trocar needle was advanced through the abdominal wall and into the mass.  Several 18-gauge core biopsies were then coaxially obtained from the solid portion of the mass.  Biopsies were placed in formalin.  As the 17 gauge outer cannula was removed, the biopsy tract was embolized with a Gelfoam slurry.  Post biopsy imaging demonstrated no active hemorrhage or perilesional hematoma.  The patient tolerated the procedure well.  No immediate complication.  IMPRESSION:  Technically successful ultrasound guided core biopsy of left upper quadrant mass lesion  which is likely arising from the gastric wall.  Signed,  [name], MD Vascular & Interventional Radiologist [facility]   Original Report Authenticated By: [name], [name]     Medications: Scheduled Meds: . acetaminophen  1,000 mg Intravenous Q6H  . enoxaparin  40 mg Subcutaneous Q24H  . insulin aspart  0-9 Units Subcutaneous Q4H  . morphine   Intravenous Q4H  . pantoprazole (PROTONIX) IV  40 mg Intravenous Q24H      LOS: 12 days   Rubena Roseman [name]  04/17/2012, 5:41 PM Pager: [phone]  If 7PM-7AM, please contact night-coverage [email or web address] Password [email or web address]

## 2012-04-17 NOTE — Progress Notes (Signed)
General surgery attending note: Patient interviewed and examined this morning. Agree with the assessment and treatment plan outlined by Ms. [name], PA. Abdominal exam is soft and benign. Not distended. Wound looks fine. Minimal bowel sounds.  Still has ileus. Continue NG Need to mobilize and ambulate in hall. Discontinue Foley catheter.   Angelia Mould. Derrell Lolling, [name], [facility] General and Minimally invasive Surgery Breast and Colorectal Surgery Office:   [phone] Pager:   [phone]

## 2012-04-17 NOTE — Progress Notes (Signed)
2 Days Post-Op  Subjective: Pt c/o abdominal pain and itching from his pain medication.  He was switched from dilaudid to morphine.  He has less itching with morphine, but is being given benadryl every 6 hours to help prevent itching.  He denies N/V.  Urinating well.    Objective: Vital signs in last 24 hours: Temp:  [97.5 F (36.4 C)-99.5 F (37.5 C)] 97.5 F (36.4 C) (03/02 0547) Pulse Rate:  [70-89] 70 (03/02 0547) Resp:  [12-22] 18 (03/02 0831) BP: (151-165)/(61-68) 153/66 mmHg (03/02 0547) SpO2:  [95 %-100 %] 100 % (03/02 0831) Last BM Date: 04/14/12  Intake/Output from previous day: 03/01 0701 - 03/02 0700 In: -  Out: 1550 [Urine:500; Emesis/NG output:1050] Intake/Output this shift: Total I/O In: -  Out: 200 [Urine:200]  PE: Gen:  Alert, NAD, pleasant Abd: Soft, NT/ND, +BS, no HSM, incisions C/D/I with some serosanguinous drainage   Lab Results:   Recent Labs  04/16/12 0627 04/17/12 0658  WBC 16.7* 14.2*  HGB 9.4* 8.2*  HCT 28.7* 25.1*  PLT 300 257   BMET  Recent Labs  04/16/12 0627 04/17/12 0658  NA 142 146*  K 4.4 4.5  CL 110 113*  CO2 24 26  GLUCOSE 172* 128*  BUN 13 9  CREATININE 1.10 1.04  CALCIUM 8.0* 7.9*   PT/INR No results found for this basename: LABPROT, INR,  in the last 72 hours CMP     Component Value Date/Time   NA 146* 04/17/2012 0658   K 4.5 04/17/2012 0658   CL 113* 04/17/2012 0658   CO2 26 04/17/2012 0658   GLUCOSE 128* 04/17/2012 0658   BUN 9 04/17/2012 0658   CREATININE 1.04 04/17/2012 0658   CALCIUM 7.9* 04/17/2012 0658   PROT 5.7* 04/11/2012 0700   ALBUMIN 2.0* 04/11/2012 0700   AST 28 04/11/2012 0700   ALT 32 04/11/2012 0700   ALKPHOS 87 04/11/2012 0700   BILITOT 0.2* 04/11/2012 0700   GFRNONAA 71* 04/17/2012 0658   GFRAA 82* 04/17/2012 0658   Lipase  No results found for this basename: lipase       Studies/Results: No results found.  Anti-infectives: Anti-infectives   Start     Dose/Rate Route Frequency Ordered Stop   04/15/12 1930  ceFAZolin (ANCEF) IVPB 1 g/50 mL premix     1 g 100 mL/hr over 30 Minutes Intravenous Every 6 hours 04/15/12 1800 04/16/12 0715   04/15/12 0800  [MAR Hold]  ceFAZolin (ANCEF) IVPB 2 g/50 mL premix     (On MAR Hold since 04/15/12 1228)   2 g 100 mL/hr over 30 Minutes Intravenous On call to O.R. 04/15/12 0752 04/15/12 1315   04/08/12 0845  cefTRIAXone (ROCEPHIN) 1 g in dextrose 5 % 50 mL IVPB  Status:  Discontinued     1 g 100 mL/hr over 30 Minutes Intravenous Every 24 hours 04/08/12 0832 04/12/12 1402   04/06/12 1800  cefTRIAXone (ROCEPHIN) 1 g in dextrose 5 % 50 mL IVPB  Status:  Discontinued     1 g 100 mL/hr over 30 Minutes Intravenous Every 24 hours 04/06/12 1504 04/07/12 1515   04/05/12 1615  cefTRIAXone (ROCEPHIN) 2 g in dextrose 5 % 50 mL IVPB  Status:  Discontinued     2 g 100 mL/hr over 30 Minutes Intravenous Every 24 hours 04/05/12 1600 04/06/12 1504       Assessment/Plan S/P partial gastrectomy with resection large GIST POD#2 - await bowel function, continue NGT/NPO, may need PICC line and  TPN at some point if ileus doesn't resolve soon ABL anemia - continue to monitor, down a bit from yesterday, no need for transfusion today FEN - lytes OK, cont IVF, add IV tylenol for pain Leukocytosis - improved down to 14.2 today Mult medical problems - per primary service   LOS: 12 days    [name] 04/17/2012, 9:16 AM Pager: [phone]

## 2012-04-18 ENCOUNTER — Encounter (HOSPITAL_COMMUNITY): Payer: Self-pay | Admitting: General Surgery

## 2012-04-18 LAB — CBC
HCT: 25.5 % — ABNORMAL LOW (ref 39.0–52.0)
Hemoglobin: 8.2 g/dL — ABNORMAL LOW (ref 13.0–17.0)
MCH: 29.5 pg (ref 26.0–34.0)
MCHC: 32.2 g/dL (ref 30.0–36.0)

## 2012-04-18 LAB — GLUCOSE, CAPILLARY

## 2012-04-18 MED ORDER — FLUTICASONE PROPIONATE 50 MCG/ACT NA SUSP
1.0000 | Freq: Every day | NASAL | Status: DC
  Administered 2012-04-18 – 2012-04-23 (×4): 1 via NASAL
  Filled 2012-04-18: qty 16

## 2012-04-18 MED ORDER — MENTHOL 3 MG MT LOZG: 1.0000 | LOZENGE | OROMUCOSAL | Status: DC | PRN

## 2012-04-18 MED ORDER — SALINE SPRAY 0.65 % NA SOLN
1.0000 | NASAL | Status: DC | PRN
  Administered 2012-04-18 – 2012-04-22 (×2): 1 via NASAL
  Filled 2012-04-18: qty 44

## 2012-04-18 MED ORDER — MORPHINE SULFATE 2 MG/ML IJ SOLN: 1.0000 mg | INTRAMUSCULAR | Status: DC | PRN

## 2012-04-18 MED ORDER — ACETAMINOPHEN 10 MG/ML IV SOLN
1000.0000 mg | Freq: Four times a day (QID) | INTRAVENOUS | Status: AC | PRN
  Administered 2012-04-18 – 2012-04-19 (×2): 1000 mg via INTRAVENOUS
  Filled 2012-04-18 (×3): qty 100

## 2012-04-18 MED ORDER — SODIUM CHLORIDE 0.9 % IJ SOLN
10.0000 mL | INTRAMUSCULAR | Status: DC | PRN
  Administered 2012-04-18 – 2012-04-22 (×5): 10 mL

## 2012-04-18 MED ORDER — PHENOL 1.4 % MT LIQD
1.0000 | OROMUCOSAL | Status: DC | PRN
  Administered 2012-04-18: 1 via OROMUCOSAL
  Filled 2012-04-18: qty 177

## 2012-04-18 NOTE — Progress Notes (Signed)
Peripherally Inserted Central Catheter/Midline Placement  The IV Nurse has discussed with the patient and/or persons authorized to consent for the patient, the purpose of this procedure and the potential benefits and risks involved with this procedure.  The benefits include less needle sticks, lab draws from the catheter and patient may be discharged home with the catheter.  Risks include, but not limited to, infection, bleeding, blood clot (thrombus formation), and puncture of an artery; nerve damage and irregular heat beat.  Alternatives to this procedure were also discussed.  PICC/Midline Placement Documentation        Lisabeth Devoid 04/18/2012, 2:56 PM Consent obtained by Lazarus Gowda, RN

## 2012-04-18 NOTE — Progress Notes (Signed)
Physical Therapy Treatment Patient Details Name: [name] MRN: [id number] DOB: 1940-08-25 Today's Date: 04/18/2012 Time: 4098-1191 PT Time Calculation (min): 26 min  PT Assessment / Plan / Recommendation Comments on Treatment Session  Pt tolerated treatment well. Patient highly motivated. Patient able to increase activity and ambulation has walked with nursing today as well as ambulated the whole unit with therapy at supervision levels. Spoke with patient regarding expectations for mobility and activity at home. Anticipate patient may be able to go home with HHPT if progress continues.    Follow Up Recommendations  Home health PT     Does the patient have the potential to tolerate intense rehabilitation     Barriers to Discharge        Equipment Recommendations  Other (comment) (TBD post acute)    Recommendations for Other Services    Frequency Min 3X/week   Plan Discharge plan needs to be updated    Precautions / Restrictions Precautions Precautions: Fall Restrictions Weight Bearing Restrictions: No   Pertinent Vitals/Pain No pain at this time    Mobility  Bed Mobility Bed Mobility: Right Sidelying to Sit;Sitting - Scoot to Edge of Bed;Sit to Supine Right Sidelying to Sit: 5: Supervision;HOB flat Sitting - Scoot to Edge of Bed: 5: Supervision;With rail Sit to Supine: 5: Supervision;HOB flat Details for Bed Mobility Assistance: struggle, but safe Transfers Transfers: Sit to Stand;Stand to Sit Sit to Stand: 5: Supervision Stand to Sit: 5: Supervision Details for Transfer Assistance: vc's for hand palcement and safety Ambulation/Gait Ambulation/Gait Assistance: 5: Supervision Ambulation Distance (Feet): 400 Feet Assistive device: Rolling walker (pushing IV pole,  likely needs more stable device) Ambulation/Gait Assistance Details: Patient very steady using rw Gait Pattern: Step-through pattern;Decreased step length - right;Decreased step length - left;Decreased  stride length;Narrow base of support (decr knee control bil) Gait velocity: WFL good cadence General Gait Details: patient steady; minimal VCs for upright posture Stairs: No      PT Goals Acute Rehab PT Goals PT Goal Formulation: With patient Time For Goal Achievement: 04/28/12 Potential to Achieve Goals: Good Pt will go Supine/Side to Sit: with modified independence PT Goal: Supine/Side to Sit - Progress: Progressing toward goal Pt will go Sit to Stand: with supervision PT Goal: Sit to Stand - Progress: Progressing toward goal Pt will Transfer Bed to Chair/Chair to Bed: with supervision PT Transfer Goal: Bed to Chair/Chair to Bed - Progress: Progressing toward goal Pt will Ambulate: 51 - 150 feet;with supervision;with least restrictive assistive device PT Goal: Ambulate - Progress: Progressing toward goal  Visit Information  Last PT Received On: 04/18/12 Assistance Needed: +1    Subjective Data  Subjective: I really am not in very much pain at all Patient Stated Goal: to go home   Cognition  Cognition Overall Cognitive Status: Appears within functional limits for tasks assessed/performed Arousal/Alertness: Awake/alert Orientation Level: Appears intact for tasks assessed Behavior During Session: [facility] for tasks performed    Balance  Balance Balance Assessed: No  End of Session PT - End of Session Equipment Utilized During Treatment: Gait belt Activity Tolerance: Patient tolerated treatment well;Patient limited by fatigue Patient left: in bed;with call bell/phone within reach (sitting EOB talking to MD) Nurse Communication: Mobility status   GP     Fabio Asa 04/18/2012, 4:24 PM Charlotte Crumb, PT DPT  [phone]

## 2012-04-18 NOTE — Progress Notes (Signed)
Patient ID: [name]  male  ZOX:[id number]    DOB: May 22, 1940    DOA: 04/05/2012  PCP: [name], MD  Assessment/Plan:  1. Subacute upper GI bleeding: negative EGD on 2/19. Resolved - [facility] following, EGD and C-scope unrevealing.  2. 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 Acute blood loss anemia: S/p 8 units of  PRBCs during hospitalization 4. Atrial fibrillation: in sinus rhythm. Antiplatelets held second GI bleeding.  5. Acute on CKD: ARF resolved.  6. Spingomonas Paucimobilis UTI: recent urinary tract manipulation/penile prosthesis implantation: completed 7 days of rocephin 7. Leukocytosis: ? stress margination. resolved. 8. History of CVA: CT head without acute findings. No focal deficits at this time. Antiplatelets held secondary to GI bleed. 9. History of depression: Continue Seroquel. 10. Uncontrolled type II DM:  Fairly controlled. 11. Altered mental status: Possibly secondary to symptomatic anemia and UTI, resolved 12. Recent penile implant and circumcision: No acute findings. Discussed by Dr Waymon Amato with Dr. Brunilda Payor on 2/20-sutures are self absorbable and okay to use condom catheter. Outpatient followup. 13. Mild hypernatremia and hyperchloremia: improved. 14. Lactic acidosis: Likely secondary to hypoperfusion from GI bleeding and anemia. Resolved. 15. Hypokalemia: replete and follow   DVT Prophylaxis: SCD's  Code Status: DNR  Disposition: home health     Subjective: Feels better   Objective: Weight change:   Intake/Output Summary (Last 24 hours) at 04/18/12 1559 Last data filed at 04/18/12 1400  Gross per 24 hour  Intake   1606 ml  Output   1250 ml  Net    356 ml   Blood pressure 182/56, pulse 64, temperature 99.5 F (37.5 C), temperature source Oral, resp. rate 18, height 5\' 6"  (1.676 m), weight  85.73 kg (189 lb), SpO2 98.00%. Patient Vitals for the past 24 hrs:  BP Temp Temp src Pulse Resp SpO2  04/18/12 1314 182/56 mmHg 99.5 F (37.5 C) Oral 64 18 98 %  04/18/12 0809 - - - - 18 100 %  04/18/12 0641 112/64 mmHg 99.1 F (37.3 C) Oral 98 18 100 %  04/18/12 0634 155/57 mmHg 98.8 F (37.1 C) Oral 62 - 100 %  04/18/12 0225 169/60 mmHg 99 F (37.2 C) Oral 65 18 100 %  04/18/12 0103 - - - - 14 100 %  04/17/12 2111 163/64 mmHg 98.8 F (37.1 C) Oral 53 18 99 %  04/17/12 2110 - - - - 12 100 %  04/17/12 1823 172/61 mmHg 98.4 F (36.9 C) Oral 70 18 98 %  04/17/12 1643 - - - - 18 98 %    Physical Exam: General: Alert and awake, oriented x3, NAD. CVS: S1-S2 clear, no murmur rubs or gallops Chest: CTAB Extremities: no c//c/e bilaterally NG tube with dark material coming out   Lab Results: Basic Metabolic Panel:  Recent Labs Lab 04/13/12 0624  04/16/12 0627 04/17/12 0658  NA  --   < > 142 146*  K  --   < > 4.4 4.5  CL  --   < > 110 113*  CO2  --   < > 24 26  GLUCOSE  --   < > 172* 128*  BUN  --   < > 13 9  CREATININE  --   < > 1.10 1.04  CALCIUM  --   < > 8.0* 7.9*  MG 2.0  --   --   --   < > = values in this interval not displayed. Liver Function Tests: No results found for this basename: AST, ALT, ALKPHOS, BILITOT, PROT, ALBUMIN,  in the last 168 hours No results found for this basename: LIPASE, AMYLASE,  in the last 168 hours No results found for this basename: AMMONIA,  in the last 168 hours CBC:  Recent Labs Lab 04/17/12 0658 04/18/12 1002  WBC 14.2* 14.8*  HGB 8.2* 8.2*  HCT 25.1* 25.5*  MCV 90.9 91.7  PLT 257 247   Cardiac Enzymes: No results found for this basename: CKTOTAL, CKMB, CKMBINDEX, TROPONINI,  in the last 168 hours BNP: No components found with this basename: POCBNP,  CBG:  Recent Labs Lab 04/17/12 2020 04/18/12 0007 04/18/12 0332 04/18/12 0803 04/18/12 1141  GLUCAP 123* 103* 110* 117* 119*     Micro Results: Recent Results  (from the past 240 hour(s))  SURGICAL PCR SCREEN     Status: None   Collection Time    04/15/12 10:28 AM      Result Value Range Status   MRSA, PCR NEGATIVE  NEGATIVE Final   Staphylococcus aureus NEGATIVE  NEGATIVE Final   Comment:            The Xpert SA Assay (FDA     approved for NASAL specimens     in patients over 35 years of age),     is one component of     a comprehensive surveillance     program.  Test performance has     been validated by [employer] for patients greater     than or equal to 24 year old.     It is not intended     to diagnose infection nor to     guide or monitor treatment.    Studies/Results: Ct Abdomen Pelvis Wo Contrast  04/06/2012  *RADIOLOGY REPORT*  Clinical Data: Left upper quadrant mass on ultrasound  CT ABDOMEN AND PELVIS WITHOUT CONTRAST  Technique:  Multidetector CT imaging of the abdomen and pelvis was performed following the standard protocol without intravenous contrast.  Comparison: Ultrasound of the kidneys of 04/05/2012  Findings: The lung bases are clear.  There is moderate cardiomegaly present.  No pericardial effusion is seen.  The liver is unremarkable in the unenhanced state.  Faintly calcified gallstones are noted within the somewhat distended gallbladder and the gallbladder wall is slightly thickened as well and clinical correlation is recommended to exclude cholecystitis.  The pancreas appears somewhat atrophic and the pancreatic duct is not dilated. The adrenal glands are both slightly nodular most consistent with adrenal adenomas.  Metastatic involvement would be difficult to exclude.  The spleen is normal in size.  There is a large rounded soft tissue mass within the left upper quadrant as noted by ultrasound which appears to be intimately associated with the greater curvature of the stomach.  This mass measures approximately 11.1 x 12.7 x 13.2 cm and is somewhat inhomogeneous in attenuation on this unenhanced study.  The primary  considerations are that of a gastrointestinal stromal tumor (G I S T tumor), gastric carcinoma or massive adenopathy as with lymphoma. Biopsy could be performed in view of the location of this mass.  The kidneys are unremarkable in the unenhanced state.  No renal calculi are noted.  No adenopathy is seen.   The abdominal aorta is normal in caliber.  The urinary bladder is unremarkable.  A reservoir for penile prosthesis is noted just anterior to the urinary bladder.  The prostate is slightly prominent.  No abnormality of the colon is seen.  The appendix is unremarkable.  There are degenerative changes diffusely throughout the facet joints of the lower lumbar spine.  IMPRESSION:  1.  Large somewhat inhomogeneous rounded soft tissue mass in the left upper quadrant appears to be intimately associated with the greater curvature of the stomach and may represent a GIST tumor versus gastric carcinoma  carcinoma  carcinoma or adenopathy as with lymphoma.  Biopsy could be performed. 2.  Faintly calcified gallstones within a somewhat thick-walled gallbladder.  Rule out acute cholecystitis. 3.  Nodular adrenal glands.  Possibly adrenal adenomas but cannot exclude metastatic involvement.   Original Report Authenticated By: Dwyane Dee, [name]    Dg Chest 2 View  04/05/2012  *RADIOLOGY REPORT*  Clinical Data: Altered mental status.  CHEST - 2 VIEW  Comparison: 03/03/2012.  Findings: The cardiac silhouette, mediastinal and hilar contours are stable.  Stable surgical changes from bypass surgery.  No acute pulmonary findings.  No pleural effusion or pneumothorax.  The bony thorax is intact.  IMPRESSION: No acute cardiopulmonary findings.   Original Report Authenticated By: Rudie Meyer, [name]    Ct Head Wo Contrast  04/05/2012  *RADIOLOGY REPORT*  Clinical Data: Weakness and altered mental status.  CT HEAD WITHOUT CONTRAST  Technique:  Contiguous axial images were obtained from the base of the skull through the vertex without  contrast.  Comparison: 08/22/2010.  Findings: Stable age related cerebral atrophy, ventriculomegaly and periventricular white matter disease.  No extra-axial fluid collections.  No CT findings for hemispheric infarction and/or intracranial hemorrhage.  No mass lesions.  The brainstem and cerebellum are normal and stable. There is a remote right cerebellar hemispheric infarction.  The bony structures are intact.  No skull fracture.  Mild mucoperiosteal thickening in the left half of the sphenoid sinus and scattered ethmoid air cells.  The mastoid air cells are clear.  IMPRESSION:  1. Stable age related cerebral atrophy, ventriculomegaly and periventricular white matter disease. 2.  Remote right-sided cerebellar infarction. 3.  No acute intracranial findings or mass lesion.   Original Report Authenticated By: Rudie Meyer, [name]    US Renal  04/05/2012  *RADIOLOGY REPORT*  Clinical Data: Acute renal failure  RENAL/URINARY TRACT ULTRASOUND COMPLETE  Comparison:  03/10/2006 CT  Findings:  Right Kidney:  Measures 10.8 cm.  No hydronephrosis.  1.5 x 1.3 cm interpolar hypoechoic lesion is incompletely characterized.  Left Kidney:  Measures 10.5 cm.  No hydronephrosis or focal abnormality.  Bladder:  Not visualized.  The patient has a Foley catheter in place.  Incidental note is made of a mixed cystic and solid mass in the left upper quadrant.  IMPRESSION: No hydronephrosis.  Incompletely characterized 1.5 cm hypoechoic right renal lesion, favored to reflect a mildly complex cyst.  Incompletely characterized 15 cm left upper quadrant mass.  Contrast enhanced CT or MRI recommended to further evaluate the above findings.   Original Report Authenticated By: Jearld Lesch, [name]    US Biopsy  04/11/2012  *RADIOLOGY REPORT*  ULTRASOUND-GUIDED CORE BIOPSY  Date: 04/11/2012  Clinical History: 73 year old male with areas of recently diagnosed left upper quadrant mass appears pedunculated and exophytic from the greater  curvature of the stomach.  He presents for ultrasound- guided biopsy to facilitate tissue diagnosis.  Procedures Performed: 1. Ultrasound-guided core biopsy  Interventional Radiologist:  [name], MD  Sedation: Moderate (  conscious) sedation was used.  One mg Versed, 50 mcg Fentanyl were administered intravenously.  The patient's vital signs were monitored continuously by radiology nursing throughout the procedure.  Sedation Time: 10 minutes  Fluoroscopy time: None  Contrast volume: None  PROCEDURE/FINDINGS:   Informed consent was obtained from the patient following explanation of the procedure, risks, benefits and alternatives. The patient understands, agrees and consents for the procedure. All questions were addressed. A time out was performed.  Maximal barrier sterile technique utilized including caps, mask, sterile gowns, sterile gloves, large sterile drape, hand hygiene, and betadine skin prep.  The left upper quadrant was interrogated with ultrasound.  There is a large complex cystic and solid mass in the left upper quadrant which is inseparable from the stomach.  The internal cystic area likely represent areas of internal necrosis. A suitable skin entry site was selected marked.  Local anesthesia was obtained by infiltration of 1% lidocaine.  Under direct sonographic guidance, a 17 gauge trocar needle was advanced through the abdominal wall and into the mass.  Several 18-gauge core biopsies were then coaxially obtained from the solid portion of the mass.  Biopsies were placed in formalin.  As the 17 gauge outer cannula was removed, the biopsy tract was embolized with a Gelfoam slurry.  Post biopsy imaging demonstrated no active hemorrhage or perilesional hematoma.  The patient tolerated the procedure well.  No immediate complication.  IMPRESSION:  Technically successful ultrasound guided core biopsy of left upper quadrant mass lesion which is likely arising from the gastric wall.  Signed,  [name], MD Vascular & Interventional Radiologist [facility]   Original Report Authenticated By: [name], [name]     Medications: Scheduled Meds: . enoxaparin  40 mg Subcutaneous Q24H  . fluticasone  1 spray Each Nare Daily  . insulin aspart  0-9 Units Subcutaneous Q4H  . pantoprazole (PROTONIX) IV  40 mg Intravenous Q24H      LOS: 13 days   LAZA,SORIN [name]  04/18/2012, 3:59 PM Pager: [phone]  If 7PM-7AM, please contact night-coverage [email or web address] Password [email or web address]

## 2012-04-18 NOTE — Clinical Social Work Psychosocial (Signed)
     Clinical Social Work Department BRIEF PSYCHOSOCIAL ASSESSMENT 04/18/2012  Patient:  [name], [name]     Account Number:  [id number]     Admit date:  04/05/2012  Clinical Social Worker:  Hulan Fray  Date/Time:  04/18/2012 01:49 PM  Referred by:  CSW  Date Referred:  04/15/2012 Referred for  Other - See comment   Other Referral:   Potential SNF placement   Interview type:  Patient Other interview type:    PSYCHOSOCIAL DATA Living Status:  ALONE Admitted from facility:   Level of care:   Primary support name:  Sena Slate Primary support relationship to patient:  CHILD, ADULT Degree of support available:   adequate    CURRENT CONCERNS Current Concerns  Other - See comment   Other Concerns:   Patient prefers returning home with home services    SOCIAL WORK ASSESSMENT / PLAN Clinical Social Worker received referral from [address] CSW regarding potential SNF placement vs CIR at discharge. CSW introduced self to patient and explained reason for visit. CSW explained the process of SNF placement and the need for SNF search as well, pending CIR evalution. Patient reported "the Doctor told me he will recommend home services."  CSW left SNF packet in patient's room. Patient did not appear interested in CSW initiating SNF search at this time. CSW will await PT's evaluation.   Assessment/plan status:  Other - See comment Other assessment/ plan:   CSW will await PT's recommendation for discharge planning needs   Information/referral to community resources:   SNF packet    PATIENTS/FAMILYS RESPONSE TO PLAN OF CARE: Patient did not appear interested in CSW initiating SNF search at this time.

## 2012-04-18 NOTE — Progress Notes (Signed)
3 Days Post-Op  Subjective: Pt feels better today, pain pretty much controlled on IV tylenol.  Pt ambulating some and using IS.  Pt doing well since catheter removed.  Pt denies N/V.    Objective: Vital signs in last 24 hours: Temp:  [97.8 F (36.6 C)-99.1 F (37.3 C)] 99.1 F (37.3 C) (03/03 0641) Pulse Rate:  [53-98] 98 (03/03 0641) Resp:  [12-20] 18 (03/03 0809) BP: (112-172)/(45-64) 112/64 mmHg (03/03 0641) SpO2:  [98 %-100 %] 100 % (03/03 0809) FiO2 (%):  [100 %] 100 % (03/02 2110) Last BM Date: 04/15/12  Intake/Output from previous day: 03/02 0701 - 03/03 0700 In: 5676 [I.V.:5576; IV Piggyback:100] Out: 2125 [Urine:1525; Emesis/NG output:600] Intake/Output this shift:    PE: Gen:  Alert, NAD, pleasant Abd: Soft, ND, mild tenderness, diminished BS, no HSM, incisions C/D/I   Lab Results:   Recent Labs  04/16/12 0627 04/17/12 0658  WBC 16.7* 14.2*  HGB 9.4* 8.2*  HCT 28.7* 25.1*  PLT 300 257   BMET  Recent Labs  04/16/12 0627 04/17/12 0658  NA 142 146*  K 4.4 4.5  CL 110 113*  CO2 24 26  GLUCOSE 172* 128*  BUN 13 9  CREATININE 1.10 1.04  CALCIUM 8.0* 7.9*   PT/INR No results found for this basename: LABPROT, INR,  in the last 72 hours CMP     Component Value Date/Time   NA 146* 04/17/2012 0658   K 4.5 04/17/2012 0658   CL 113* 04/17/2012 0658   CO2 26 04/17/2012 0658   GLUCOSE 128* 04/17/2012 0658   BUN 9 04/17/2012 0658   CREATININE 1.04 04/17/2012 0658   CALCIUM 7.9* 04/17/2012 0658   PROT 5.7* 04/11/2012 0700   ALBUMIN 2.0* 04/11/2012 0700   AST 28 04/11/2012 0700   ALT 32 04/11/2012 0700   ALKPHOS 87 04/11/2012 0700   BILITOT 0.2* 04/11/2012 0700   GFRNONAA 71* 04/17/2012 0658   GFRAA 82* 04/17/2012 0658   Lipase  No results found for this basename: lipase       Studies/Results: No results found.  Anti-infectives: Anti-infectives   Start     Dose/Rate Route Frequency Ordered Stop   04/15/12 1930  ceFAZolin (ANCEF) IVPB 1 g/50 mL premix     1  g 100 mL/hr over 30 Minutes Intravenous Every 6 hours 04/15/12 1800 04/16/12 0715   04/15/12 0800  [MAR Hold]  ceFAZolin (ANCEF) IVPB 2 g/50 mL premix     (On MAR Hold since 04/15/12 1228)   2 g 100 mL/hr over 30 Minutes Intravenous On call to O.R. 04/15/12 0752 04/15/12 1315   04/08/12 0845  cefTRIAXone (ROCEPHIN) 1 g in dextrose 5 % 50 mL IVPB  Status:  Discontinued     1 g 100 mL/hr over 30 Minutes Intravenous Every 24 hours 04/08/12 0832 04/12/12 1402   04/06/12 1800  cefTRIAXone (ROCEPHIN) 1 g in dextrose 5 % 50 mL IVPB  Status:  Discontinued     1 g 100 mL/hr over 30 Minutes Intravenous Every 24 hours 04/06/12 1504 04/07/12 1515   04/05/12 1615  cefTRIAXone (ROCEPHIN) 2 g in dextrose 5 % 50 mL IVPB  Status:  Discontinued     2 g 100 mL/hr over 30 Minutes Intravenous Every 24 hours 04/05/12 1600 04/06/12 1504       Assessment/Plan S/P partial gastrectomy with resection large GIST POD#3 - await bowel function, continue NGT/NPO, may need PICC line and TPN at some point if ileus doesn't resolve soon  ABL anemia - continue to monitor, down a bit yesterday, pending repeat labs FEN - cont IVF, add IV tylenol for pain, d/c PCA  Leukocytosis - improved down to 14.2 yesterday, pending repeat labs Mult medical problems - per primary service Ambulate and IS Disp - await return of bowel function then can start to advance diet    LOS: 13 days    [name], MEGAN 04/18/2012, 8:58 AM Pager: [phone]

## 2012-04-18 NOTE — Progress Notes (Signed)
Awaiting pathology.  He is not taking a lot of pain medications.  NGT still in place.  No flatus or BM.  This patient has been seen and I agree with the findings and treatment plan.  Marta Lamas. Gae Bon, MD, FACS [phone] (pager) [phone] (direct pager) Trauma Surgeon

## 2012-04-19 DIAGNOSIS — C494 Malignant neoplasm of connective and soft tissue of abdomen: Secondary | ICD-10-CM

## 2012-04-19 LAB — TYPE AND SCREEN

## 2012-04-19 LAB — GLUCOSE, CAPILLARY
Glucose-Capillary: 105 mg/dL — ABNORMAL HIGH (ref 70–99)
Glucose-Capillary: 114 mg/dL — ABNORMAL HIGH (ref 70–99)
Glucose-Capillary: 138 mg/dL — ABNORMAL HIGH (ref 70–99)
Glucose-Capillary: 155 mg/dL — ABNORMAL HIGH (ref 70–99)

## 2012-04-19 MED ORDER — ACETAMINOPHEN 325 MG PO TABS
650.0000 mg | ORAL_TABLET | Freq: Four times a day (QID) | ORAL | Status: DC | PRN
  Administered 2012-04-19 – 2012-04-21 (×3): 650 mg via ORAL
  Filled 2012-04-19 (×3): qty 2

## 2012-04-19 MED ORDER — HYDRALAZINE HCL 20 MG/ML IJ SOLN
10.0000 mg | Freq: Four times a day (QID) | INTRAMUSCULAR | Status: DC | PRN
  Administered 2012-04-21: 10 mg via INTRAVENOUS
  Filled 2012-04-19: qty 0.5

## 2012-04-19 MED ORDER — PANTOPRAZOLE SODIUM 40 MG IV SOLR
40.0000 mg | Freq: Two times a day (BID) | INTRAVENOUS | Status: DC
  Administered 2012-04-19 – 2012-04-21 (×5): 40 mg via INTRAVENOUS
  Filled 2012-04-19 (×6): qty 40

## 2012-04-19 MED ORDER — MORPHINE SULFATE 2 MG/ML IJ SOLN: 1.0000 mg | Freq: Once | INTRAMUSCULAR | Status: DC

## 2012-04-19 MED ORDER — DIPHENHYDRAMINE HCL 50 MG/ML IJ SOLN
25.0000 mg | Freq: Every evening | INTRAMUSCULAR | Status: DC | PRN
  Administered 2012-04-20: 25 mg via INTRAVENOUS
  Filled 2012-04-19: qty 1

## 2012-04-19 MED ORDER — ZOLPIDEM TARTRATE 5 MG PO TABS
5.0000 mg | ORAL_TABLET | Freq: Every evening | ORAL | Status: DC | PRN
  Administered 2012-04-19: 5 mg via ORAL
  Filled 2012-04-19: qty 1

## 2012-04-19 MED ORDER — TAMSULOSIN HCL 0.4 MG PO CAPS
0.4000 mg | ORAL_CAPSULE | Freq: Every day | ORAL | Status: DC
  Administered 2012-04-19 – 2012-04-23 (×5): 0.4 mg via ORAL
  Filled 2012-04-19 (×5): qty 1

## 2012-04-19 NOTE — Progress Notes (Signed)
I saw the patient originally on 04/15/2012. He has a new diagnosis of gastrointestinal stromal tumor. He is now status post gastrectomy. 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 It has a high potential for metastatic disease. My recommendation would be that patient will need adjuvant Gleevec treatment. We will like to see the patient recalled for from his surgery. And we will see him back in the clinic in followup to discuss adjuvant treatment options.  Drue Second, MD Medical/Oncology [facility] [phone] (beeper) [phone] (Office)  04/19/2012, 10:14 PM

## 2012-04-19 NOTE — Progress Notes (Signed)
Physical Therapy Treatment Patient Details Name: [name] MRN: [id number] DOB: [birth year] Today's Date: 04/19/2012 Time: 4098-1191 PT Time Calculation (min): 24 min  PT Assessment / Plan / Recommendation Comments on Treatment Session  Pt demonstrates good overall ambulation with PT; encouraged pt to continue ambulation with nsg.  Spoke with patient at length regarding mobility expectations and impact on healing.    Follow Up Recommendations  Home health PT     Does the patient have the potential to tolerate intense rehabilitation     Barriers to Discharge        Equipment Recommendations  Other (comment) (TBD post acute)    Recommendations for Other Services    Frequency Min 3X/week   Plan Discharge plan needs to be updated    Precautions / Restrictions Precautions Precautions: Fall Restrictions Weight Bearing Restrictions: No   Pertinent Vitals/Pain 1/10    Mobility  Bed Mobility Bed Mobility: Right Sidelying to Sit;Sitting - Scoot to Edge of Bed;Sit to Supine Right Sidelying to Sit: 5: Supervision;HOB flat Sitting - Scoot to Edge of Bed: 5: Supervision;With rail Sit to Supine: 5: Supervision;HOB flat Details for Bed Mobility Assistance: Able to perform with increased effort; VC's for safety with positioning and speed Transfers Transfers: Sit to Stand;Stand to Sit Sit to Stand: 5: Supervision Stand to Sit: 5: Supervision Details for Transfer Assistance: vc's for hand palcement and safety Ambulation/Gait Ambulation/Gait Assistance: 5: Supervision Ambulation Distance (Feet): 400 Feet Assistive device: Rolling walker (pushing IV pole,  likely needs more stable device) Ambulation/Gait Assistance Details: Continue use of rw as patient appears significantly more steady with use Gait Pattern: Step-through pattern;Decreased step length - right;Decreased step length - left;Decreased stride length;Narrow base of support (decr knee control bil) Gait velocity: WFL good  cadence General Gait Details: patient steady; minimal VCs for upright posture Stairs: No     PT Goals Acute Rehab PT Goals PT Goal Formulation: With patient Time For Goal Achievement: 04/28/12 Potential to Achieve Goals: Good Pt will go Supine/Side to Sit: with modified independence PT Goal: Supine/Side to Sit - Progress: Progressing toward goal Pt will go Sit to Stand: with supervision PT Goal: Sit to Stand - Progress: Progressing toward goal Pt will Transfer Bed to Chair/Chair to Bed: with supervision Pt will Ambulate: 51 - 150 feet;with modified independence;with rolling walker PT Goal: Ambulate - Progress: Updated due to goal met  Visit Information  Last PT Received On: 04/19/12 Assistance Needed: +1    Subjective Data  Subjective: I really have not been able to sleep at all since the operation Patient Stated Goal: to go home   Cognition  Cognition Overall Cognitive Status: Appears within functional limits for tasks assessed/performed Arousal/Alertness: Awake/alert Orientation Level: Appears intact for tasks assessed Behavior During Session: [facility] for tasks performed    Balance  Balance Balance Assessed: Yes Dynamic Sitting Balance Dynamic Sitting - Balance Support: Feet supported Dynamic Sitting - Level of Assistance: 7: Independent Dynamic Standing Balance Dynamic Standing - Level of Assistance: 6: Modified independent (Device/Increase time) Dynamic Standing - Balance Activities: Reaching for objects;Reaching across midline Dynamic Standing - Comments: Patient without difficulty during activities; no VC's required  End of Session PT - End of Session Equipment Utilized During Treatment: Gait belt Activity Tolerance: Patient tolerated treatment well;Patient limited by fatigue Patient left: in bed;with call bell/phone within reach (sitting EOB talking to MD) Nurse Communication: Mobility status   GP     Fabio Asa 04/19/2012, 11:48 AM Charlotte Crumb, PT DPT  [phone]  

## 2012-04-19 NOTE — Progress Notes (Addendum)
4 Days Post-Op  Subjective: Awake and alert. Morphine stopped because of itching, but IV Tylenol is working well. Ambulating in halls. Voiding well. Passed a little flatus and a tiny stool, but not much. Denies nausea.  NG output down to 450 cc per 24 hours.  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 Margin assessment not possible due to disruption of tumor.   T4, Nx.   High risk recurrence. Will likely need Gleevec after surgery.    I discussed this with the patient. I have called Dr. Mancel Bale from medical oncology. He will review and discuss with Dr. Welton Flakes and will see patient during this hospitalization.  Objective: Vital signs in last 24 hours: Temp:  [97.5 F (36.4 C)-99.5 F (37.5 C)] 97.5 F (36.4 C) (03/04 0634) Pulse Rate:  [63-74] 63 (03/04 0636) Resp:  [17-20] 20 (03/04 0634) BP: (168-190)/(56-62) 180/60 mmHg (03/04 0636) SpO2:  [98 %-100 %] 99 % (03/04 0634) Last BM Date: 04/18/12  Intake/Output from previous day: 03/03 0701 - 03/04 0700 In: 2121.7 [I.V.:2091.7; NG/GT:30] Out: 800 [Urine:350; Emesis/NG output:450] Intake/Output this shift:    General appearance: alert. In no distress. Mental status seems normal. Asking lots of questions. Wants something for sleep. Resp: clear to auscultation bilaterally GI: abdomen is soft, flat, nondistended. Nontender. Wound looks good. Minimal bowel sounds. No tympany.  Lab Results:  Results for orders placed during the hospital encounter of 04/05/12 (from the past 24 hour(s))  GLUCOSE, CAPILLARY     Status: Abnormal   Collection Time    04/18/12  8:03 AM      Result Value Range   Glucose-Capillary 117 (*) 70 - 99 mg/dL   Comment 1 Notify RN    CBC     Status: Abnormal   Collection Time    04/18/12 10:02 AM      Result Value Range   WBC 14.8 (*) 4.0 - 10.5 K/uL   RBC 2.78 (*) 4.22 - 5.81 MIL/uL   Hemoglobin 8.2 (*) 13.0 - 17.0 g/dL   HCT 16.1 (*) 09.6 - 04.5 %   MCV 91.7  78.0 - 100.0 fL   MCH 29.5   26.0 - 34.0 pg   MCHC 32.2  30.0 - 36.0 g/dL   RDW 40.9  81.1 - 91.4 %   Platelets 247  150 - 400 K/uL  GLUCOSE, CAPILLARY     Status: Abnormal   Collection Time    04/18/12 11:41 AM      Result Value Range   Glucose-Capillary 119 (*) 70 - 99 mg/dL  GLUCOSE, CAPILLARY     Status: Abnormal   Collection Time    04/18/12  4:34 PM      Result Value Range   Glucose-Capillary 110 (*) 70 - 99 mg/dL  GLUCOSE, CAPILLARY     Status: Abnormal   Collection Time    04/18/12  8:06 PM      Result Value Range   Glucose-Capillary 109 (*) 70 - 99 mg/dL  GLUCOSE, CAPILLARY     Status: Abnormal   Collection Time    04/19/12 12:29 AM      Result Value Range   Glucose-Capillary 114 (*) 70 - 99 mg/dL  GLUCOSE, CAPILLARY     Status: Abnormal   Collection Time    04/19/12  4:02 AM      Result Value Range   Glucose-Capillary 110 (*) 70 - 99 mg/dL   Comment 1 Notify RN  Studies/Results: @RISRSLT24 @  . enoxaparin  40 mg Subcutaneous Q24H  . fluticasone  1 spray Each Nare Daily  . insulin aspart  0-9 Units Subcutaneous Q4H  . pantoprazole (PROTONIX) IV  40 mg Intravenous Q24H     Assessment/Plan: s/p Procedure(s): Partial Gastrectomy  POD #4. Stable. Ileus beginning to resolve. Clamping NG tube Sips of clear liquids. Consider Gastrografin upper GI if he has difficulty with clear liquids.  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 Dr. Mancel Bale to see and arrange outpatient followup.  Benadryl at bedtime for sleep. Avoid Seroquel (his request) at this time.  ABL anemia. No indication for transfusion. Repeat labs tomorrow  Nutrition. Consider TNA if we cannot establish enteral nutrition. Check prealbumin tomorrow  Multiple medical problems. Appreciate [facility] hospitalist input.  @PROBHOSP @  LOS: 14 days    Haywood M. Derrell Lolling, [name], [facility] General and Minimally invasive Surgery Breast and Colorectal Surgery Office:   [phone] Pager:    [phone]  04/19/2012  . .prob

## 2012-04-19 NOTE — Progress Notes (Signed)
Patient ID: [name]  male  ZOX:[id number]    DOB: January 28, 1941    DOA: 04/05/2012  PCP: [name], MD  Assessment/Plan: Subacute upper GI bleeding Negative EGD on 2/19. Resolved. [facility] following, EGD and C-scope unrevealing.   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 Lavera Guise d/w Dr Welton Flakes from oncology, per her rec's, will likely need Gleevec after surgery. Dr. Derrell Lolling has discussed with Dr. Truett Perna, who is to see the patient today. Currently on a clear liquid diet, diet advancement as per general surgery.  Acute blood loss anemia S/p 8 units of  PRBCs during hospitalization.  Atrial fibrillation Rate controlled, in sinus rhythm. Antiplatelets held second GI bleeding.   Acute on CKD ARF resolved.   Spingomonas Paucimobilis UTI Recent urinary tract manipulation/penile prosthesis implantation: completed 7 days of rocephin.  Leukocytosis ? stress margination. Resolved.  History of CVA CT head without acute findings. No focal deficits at this time. Antiplatelets held secondary to GI bleed.  History of depression Continue Seroquel.  Uncontrolled type II DM Fairly controlled.  Altered mental status Possibly secondary to symptomatic anemia and UTI, resolved.  Recent penile implant and circumcision No acute findings. Discussed by Dr Waymon Amato with Dr. Brunilda Payor on 2/20-sutures are self absorbable and okay to use condom catheter. Outpatient followup.  Mild hypernatremia and hyperchloremia Improved.  Lactic acidosis Likely secondary to hypoperfusion from GI bleeding and anemia. Resolved.  Hypokalemia Resolved with replacement.   DVT Prophylaxis: SCD's  Code Status: DNR  Disposition: home with health   Subjective: Feeling better. Ambulated with help today.  Objective: Weight change:   Intake/Output Summary (Last 24 hours) at 04/19/12 1141 Last data filed at 04/19/12 0644  Gross per 24 hour  Intake  2091.67 ml  Output    650 ml  Net 1441.67 ml   Blood pressure 180/60, pulse 63, temperature 97.5 F (36.4 C), temperature source Oral, resp. rate 20, height 5\' 6"  (1.676 m), weight 85.73 kg (189 lb), SpO2 99.00%. Patient Vitals for the past 24 hrs:  BP Temp Temp src Pulse Resp SpO2  04/19/12 0636 180/60 mmHg - - 63 - -  04/19/12 0634 - 97.5 F (36.4 C) Oral 74 20 99 %  04/19/12 0035 168/58 mmHg - - - - -  04/18/12 2248 190/60 mmHg - - - - -  04/18/12 2240 187/62 mmHg 99.2 F (37.3 C) Oral 64 17 100 %  04/18/12 1314 182/56 mmHg 99.5 F (37.5 C) Oral 64 18 98 %    Physical Exam: General: Alert and awake, oriented x3, NAD. CVS: S1-S2 clear, no murmur rubs or gallops Chest: CTAB Extremities: no c//c/e bilaterally NG tube with dark material coming out, currently clamped.  Lab Results: Basic Metabolic Panel:  Recent Labs Lab 04/13/12 0624  04/16/12 0627 04/17/12 0658  NA  --   < > 142 146*  K  --   < > 4.4 4.5  CL  --   < > 110 113*  CO2  --   < > 24 26  GLUCOSE  --   < > 172* 128*  BUN  --   < > 13 9  CREATININE  --   < > 1.10 1.04  CALCIUM  --   < > 8.0* 7.9*  MG 2.0  --   --   --   < > = values in this interval not displayed. Liver Function Tests: No results found for this basename: AST,  ALT, ALKPHOS, BILITOT, PROT, ALBUMIN,  in the last 168 hours No results found for this basename: LIPASE, AMYLASE,  in the last 168 hours No results found for this basename: AMMONIA,  in the last 168 hours CBC:  Recent Labs Lab 04/17/12 0658 04/18/12 1002  WBC 14.2* 14.8*  HGB 8.2* 8.2*  HCT 25.1* 25.5*  MCV 90.9 91.7  PLT 257 247   Cardiac Enzymes: No results found for this basename: CKTOTAL, CKMB, CKMBINDEX, TROPONINI,  in the last 168 hours BNP: No components found with this basename: POCBNP,  CBG:  Recent Labs Lab 04/18/12 1634 04/18/12 2006 04/19/12 0029 04/19/12 0402 04/19/12 0804  GLUCAP 110* 109* 114* 110* 105*     Micro Results: Recent Results (from  the past 240 hour(s))  SURGICAL PCR SCREEN     Status: None   Collection Time    04/15/12 10:28 AM      Result Value Range Status   MRSA, PCR NEGATIVE  NEGATIVE Final   Staphylococcus aureus NEGATIVE  NEGATIVE Final   Comment:            The Xpert SA Assay (FDA     approved for NASAL specimens     in patients over 73 years of age),     is one component of     a comprehensive surveillance     program.  Test performance has     been validated by [employer] for patients greater     than or equal to 50 year old.     It is not intended     to diagnose infection nor to     guide or monitor treatment.    Studies/Results: Ct Abdomen Pelvis Wo Contrast  04/06/2012  *RADIOLOGY REPORT*  Clinical Data: Left upper quadrant mass on ultrasound  CT ABDOMEN AND PELVIS WITHOUT CONTRAST  Technique:  Multidetector CT imaging of the abdomen and pelvis was performed following the standard protocol without intravenous contrast.  Comparison: Ultrasound of the kidneys of 04/05/2012  Findings: The lung bases are clear.  There is moderate cardiomegaly present.  No pericardial effusion is seen.  The liver is unremarkable in the unenhanced state.  Faintly calcified gallstones are noted within the somewhat distended gallbladder and the gallbladder wall is slightly thickened as well and clinical correlation is recommended to exclude cholecystitis.  The pancreas appears somewhat atrophic and the pancreatic duct is not dilated. The adrenal glands are both slightly nodular most consistent with adrenal adenomas.  Metastatic involvement would be difficult to exclude.  The spleen is normal in size.  There is a large rounded soft tissue mass within the left upper quadrant as noted by ultrasound which appears to be intimately associated with the greater curvature of the stomach.  This mass measures approximately 11.1 x 12.7 x 13.2 cm and is somewhat inhomogeneous in attenuation on this unenhanced study.  The primary  considerations are that of a gastrointestinal stromal tumor (G I S T tumor), gastric carcinoma or massive adenopathy as with lymphoma. Biopsy could be performed in view of the location of this mass.  The kidneys are unremarkable in the unenhanced state.  No renal calculi are noted.  No adenopathy is seen.   The abdominal aorta is normal in caliber.  The urinary bladder is unremarkable.  A reservoir for penile prosthesis is noted just anterior to the urinary bladder.  The prostate is slightly prominent.  No abnormality of the colon is seen.  The appendix  is unremarkable.  There are degenerative changes diffusely throughout the facet joints of the lower lumbar spine.  IMPRESSION:  1.  Large somewhat inhomogeneous rounded soft tissue mass in the left upper quadrant appears to be intimately associated with the greater curvature of the stomach and may represent a GIST tumor versus gastric carcinoma  carcinoma  carcinoma or adenopathy as with lymphoma.  Biopsy could be performed. 2.  Faintly calcified gallstones within a somewhat thick-walled gallbladder.  Rule out acute cholecystitis. 3.  Nodular adrenal glands.  Possibly adrenal adenomas but cannot exclude metastatic involvement.   Original Report Authenticated By: Dwyane Dee, [name]    Dg Chest 2 View  04/05/2012  *RADIOLOGY REPORT*  Clinical Data: Altered mental status.  CHEST - 2 VIEW  Comparison: 03/03/2012.  Findings: The cardiac silhouette, mediastinal and hilar contours are stable.  Stable surgical changes from bypass surgery.  No acute pulmonary findings.  No pleural effusion or pneumothorax.  The bony thorax is intact.  IMPRESSION: No acute cardiopulmonary findings.   Original Report Authenticated By: Rudie Meyer, [name]    Ct Head Wo Contrast  04/05/2012  *RADIOLOGY REPORT*  Clinical Data: Weakness and altered mental status.  CT HEAD WITHOUT CONTRAST  Technique:  Contiguous axial images were obtained from the base of the skull through the vertex without  contrast.  Comparison: 08/22/2010.  Findings: Stable age related cerebral atrophy, ventriculomegaly and periventricular white matter disease.  No extra-axial fluid collections.  No CT findings for hemispheric infarction and/or intracranial hemorrhage.  No mass lesions.  The brainstem and cerebellum are normal and stable. There is a remote right cerebellar hemispheric infarction.  The bony structures are intact.  No skull fracture.  Mild mucoperiosteal thickening in the left half of the sphenoid sinus and scattered ethmoid air cells.  The mastoid air cells are clear.  IMPRESSION:  1. Stable age related cerebral atrophy, ventriculomegaly and periventricular white matter disease. 2.  Remote right-sided cerebellar infarction. 3.  No acute intracranial findings or mass lesion.   Original Report Authenticated By: Rudie Meyer, [name]    US Renal  04/05/2012  *RADIOLOGY REPORT*  Clinical Data: Acute renal failure  RENAL/URINARY TRACT ULTRASOUND COMPLETE  Comparison:  03/10/2006 CT  Findings:  Right Kidney:  Measures 10.8 cm.  No hydronephrosis.  1.5 x 1.3 cm interpolar hypoechoic lesion is incompletely characterized.  Left Kidney:  Measures 10.5 cm.  No hydronephrosis or focal abnormality.  Bladder:  Not visualized.  The patient has a Foley catheter in place.  Incidental note is made of a mixed cystic and solid mass in the left upper quadrant.  IMPRESSION: No hydronephrosis.  Incompletely characterized 1.5 cm hypoechoic right renal lesion, favored to reflect a mildly complex cyst.  Incompletely characterized 15 cm left upper quadrant mass.  Contrast enhanced CT or MRI recommended to further evaluate the above findings.   Original Report Authenticated By: Jearld Lesch, [name]    US Biopsy  04/11/2012  *RADIOLOGY REPORT*  ULTRASOUND-GUIDED CORE BIOPSY  Date: 04/11/2012  Clinical History: 72 year old male with areas of recently diagnosed left upper quadrant mass appears pedunculated and exophytic from the greater  curvature of the stomach.  He presents for ultrasound- guided biopsy to facilitate tissue diagnosis.  Procedures Performed: 1. Ultrasound-guided core biopsy  Interventional Radiologist:  [name], MD  Sedation: Moderate (conscious) sedation was used.  One mg Versed, 50 mcg Fentanyl were administered intravenously.  The patient's vital signs were monitored continuously by radiology nursing throughout the procedure.  Sedation  Time: 10 minutes  Fluoroscopy time: None  Contrast volume: None  PROCEDURE/FINDINGS:   Informed consent was obtained from the patient following explanation of the procedure, risks, benefits and alternatives. The patient understands, agrees and consents for the procedure. All questions were addressed. A time out was performed.  Maximal barrier sterile technique utilized including caps, mask, sterile gowns, sterile gloves, large sterile drape, hand hygiene, and betadine skin prep.  The left upper quadrant was interrogated with ultrasound.  There is a large complex cystic and solid mass in the left upper quadrant which is inseparable from the stomach.  The internal cystic area likely represent areas of internal necrosis. A suitable skin entry site was selected marked.  Local anesthesia was obtained by infiltration of 1% lidocaine.  Under direct sonographic guidance, a 17 gauge trocar needle was advanced through the abdominal wall and into the mass.  Several 18-gauge core biopsies were then coaxially obtained from the solid portion of the mass.  Biopsies were placed in formalin.  As the 17 gauge outer cannula was removed, the biopsy tract was embolized with a Gelfoam slurry.  Post biopsy imaging demonstrated no active hemorrhage or perilesional hematoma.  The patient tolerated the procedure well.  No immediate complication.  IMPRESSION:  Technically successful ultrasound guided core biopsy of left upper quadrant mass lesion which is likely arising from the gastric wall.  Signed,  [name], MD Vascular & Interventional Radiologist [facility]   Original Report Authenticated By: [name], [name]     Medications: Scheduled Meds: . enoxaparin  40 mg Subcutaneous Q24H  . fluticasone  1 spray Each Nare Daily  . insulin aspart  0-9 Units Subcutaneous Q4H  . pantoprazole (PROTONIX) IV  40 mg Intravenous Q24H      LOS: 14 days   Tonetta Napoles A [name]  04/19/2012, 11:41 AM Pager: [phone]  If 7PM-7AM, please contact night-coverage [email or web address] Password [email or web address]

## 2012-04-20 LAB — GLUCOSE, CAPILLARY
Glucose-Capillary: 109 mg/dL — ABNORMAL HIGH (ref 70–99)
Glucose-Capillary: 112 mg/dL — ABNORMAL HIGH (ref 70–99)
Glucose-Capillary: 127 mg/dL — ABNORMAL HIGH (ref 70–99)

## 2012-04-20 LAB — CBC
Hemoglobin: 7.6 g/dL — ABNORMAL LOW (ref 13.0–17.0)
MCH: 28.9 pg (ref 26.0–34.0)
Platelets: 248 10*3/uL (ref 150–400)
RBC: 2.63 MIL/uL — ABNORMAL LOW (ref 4.22–5.81)
WBC: 10.2 10*3/uL (ref 4.0–10.5)

## 2012-04-20 LAB — BASIC METABOLIC PANEL
CO2: 25 mEq/L (ref 19–32)
Calcium: 8 mg/dL — ABNORMAL LOW (ref 8.4–10.5)
Chloride: 109 mEq/L (ref 96–112)
Glucose, Bld: 111 mg/dL — ABNORMAL HIGH (ref 70–99)
Potassium: 3.1 mEq/L — ABNORMAL LOW (ref 3.5–5.1)
Sodium: 143 mEq/L (ref 135–145)

## 2012-04-20 LAB — PREALBUMIN: Prealbumin: 8.4 mg/dL — ABNORMAL LOW (ref 17.0–34.0)

## 2012-04-20 MED ORDER — BOOST / RESOURCE BREEZE PO LIQD
1.0000 | Freq: Three times a day (TID) | ORAL | Status: DC
  Administered 2012-04-20 – 2012-04-23 (×7): 1 via ORAL

## 2012-04-20 MED ORDER — QUETIAPINE FUMARATE 50 MG PO TABS
75.0000 mg | ORAL_TABLET | Freq: Every day | ORAL | Status: DC
  Administered 2012-04-20: 75 mg via ORAL
  Filled 2012-04-20 (×2): qty 1

## 2012-04-20 MED ORDER — AMLODIPINE BESYLATE 5 MG PO TABS
5.0000 mg | ORAL_TABLET | Freq: Every day | ORAL | Status: DC
  Administered 2012-04-20 – 2012-04-21 (×2): 5 mg via ORAL
  Filled 2012-04-20 (×2): qty 1

## 2012-04-20 MED ORDER — POTASSIUM CHLORIDE 10 MEQ/100ML IV SOLN
10.0000 meq | INTRAVENOUS | Status: AC
  Administered 2012-04-20 (×4): 10 meq via INTRAVENOUS
  Filled 2012-04-20 (×2): qty 200

## 2012-04-20 NOTE — Progress Notes (Signed)
NUTRITION FOLLOW UP  Intervention:   1.  Supplements; resume Resource Breeze TID with meals.   Nutrition Dx:   Inadequate oral intake r/t omission of energy dense foods AEB pt on clear liquid diet, ongoing.  Monitor:   1.  Food/Beverage; diet liberalization per MD discretion 2.  Nutrition support; if pt unable to advance diet  3.  Wt/wt change; monitor trends.   Assessment:   Pt admitted with biopsy for stomach mass (2/18), found to have GIST.  Pt was started on CHO Mod Medium diet from (2/21-2/28) with interruptions for procedures.  Pt underwent tumor removal (2/28), and developed post-op ileus.  Pt's NGT was clamped today and he remains on clear liquids.  Will order clear liquid supplement for pt and monitor for tolerance.   Pt unavailable at time of visit.    Height: Ht Readings from Last 1 Encounters:  04/08/12 5\' 6"  (1.676 m)    Weight Status:   Wt Readings from Last 1 Encounters:  04/15/12 189 lb (85.73 kg)    Re-estimated needs:  Kcal: 1800-2060 Protein: 75-85g Fluid: 1.7-1.8 L/day  Skin: incision, non-pitting edema  Diet Order: Clear Liquid   Intake/Output Summary (Last 24 hours) at 04/20/12 1100 Last data filed at 04/20/12 0710  Gross per 24 hour  Intake 2243.75 ml  Output   1775 ml  Net 468.75 ml    Last BM: 3/3  Labs:   Recent Labs Lab 04/16/12 0627 04/17/12 0658 04/20/12 0520  NA 142 146* 143  K 4.4 4.5 3.1*  CL 110 113* 109  CO2 24 26 25   BUN 13 9 5*  CREATININE 1.10 1.04 0.83  CALCIUM 8.0* 7.9* 8.0*  GLUCOSE 172* 128* 111*    CBG (last 3)   Recent Labs  04/20/12 0005 04/20/12 0416 04/20/12 0742  GLUCAP 127* 111* 112*    Scheduled Meds: . enoxaparin  40 mg Subcutaneous Q24H  . fluticasone  1 spray Each Nare Daily  . insulin aspart  0-9 Units Subcutaneous Q4H  .  morphine injection  1-2 mg Intravenous Once  . pantoprazole (PROTONIX) IV  40 mg Intravenous Q12H  . tamsulosin  0.4 mg Oral Daily    Continuous Infusions: .  0.9 % NaCl with KCl 20 mEq / L 100 mL/hr at 04/20/12 1002    Loyce Dys, MS RD LDN Clinical Inpatient Dietitian Pager: [phone] Weekend/After hours pager: [phone]

## 2012-04-20 NOTE — Progress Notes (Signed)
5 Days Post-Op  Subjective: No complaints Denies weakness or dizziness on ambulating Tolerating NG clamped  Objective: Vital signs in last 24 hours: Temp:  [97.9 F (36.6 C)-98.8 F (37.1 C)] 98.2 F (36.8 C) (03/05 0650) Pulse Rate:  [64-67] 67 (03/05 0650) Resp:  [18-20] 18 (03/05 0650) BP: (162-193)/(43-70) 193/70 mmHg (03/05 0650) SpO2:  [96 %-99 %] 98 % (03/05 0650) Last BM Date: 04/18/12  Intake/Output from previous day: 03/04 0701 - 03/05 0700 In: 2243.8 [P.O.:480; I.V.:1763.8] Out: 1600 [Urine:1600] Intake/Output this shift:   Abdomen soft, non distended  Lab Results:   Recent Labs  04/18/12 1002 04/20/12 0520  WBC 14.8* 10.2  HGB 8.2* 7.6*  HCT 25.5* 22.8*  PLT 247 248   BMET  Recent Labs  04/20/12 0520  NA 143  K 3.1*  CL 109  CO2 25  GLUCOSE 111*  BUN 5*  CREATININE 0.83  CALCIUM 8.0*   PT/INR No results found for this basename: LABPROT, INR,  in the last 72 hours ABG No results found for this basename: PHART, PCO2, PO2, HCO3,  in the last 72 hours  Studies/Results: No results found.  Anti-infectives: Anti-infectives   Start     Dose/Rate Route Frequency Ordered Stop   04/15/12 1930  ceFAZolin (ANCEF) IVPB 1 g/50 mL premix     1 g 100 mL/hr over 30 Minutes Intravenous Every 6 hours 04/15/12 1800 04/16/12 0715   04/15/12 0800  [MAR Hold]  ceFAZolin (ANCEF) IVPB 2 g/50 mL premix     (On MAR Hold since 04/15/12 1228)   2 g 100 mL/hr over 30 Minutes Intravenous On call to O.R. 04/15/12 0752 04/15/12 1315   04/08/12 0845  cefTRIAXone (ROCEPHIN) 1 g in dextrose 5 % 50 mL IVPB  Status:  Discontinued     1 g 100 mL/hr over 30 Minutes Intravenous Every 24 hours 04/08/12 0832 04/12/12 1402   04/06/12 1800  cefTRIAXone (ROCEPHIN) 1 g in dextrose 5 % 50 mL IVPB  Status:  Discontinued     1 g 100 mL/hr over 30 Minutes Intravenous Every 24 hours 04/06/12 1504 04/07/12 1515   04/05/12 1615  cefTRIAXone (ROCEPHIN) 2 g in dextrose 5 % 50 mL IVPB   Status:  Discontinued     2 g 100 mL/hr over 30 Minutes Intravenous Every 24 hours 04/05/12 1600 04/06/12 1504      Assessment/Plan: s/p Procedure(s): Partial Gastrectomy (N/A)  D/C NG tube Keep on clears Post op anemia.  Asymptomatic.  Will repeat tomorrow.  May need transfusion is decreases further  LOS: 15 days    BLACKMAN,DOUGLAS A 04/20/2012

## 2012-04-20 NOTE — Progress Notes (Addendum)
Patient ID: DEKKER VERGA  male  ZOX:[id number]    DOB: Dec 29, 1940    DOA: 04/05/2012  PCP: [name], MD  Assessment/Plan: Subacute upper GI bleeding Negative EGD on 2/19. Resolved. [facility] evaluated the patient, EGD and C-scope unrevealing.   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 Lavera Guise d/w Dr Welton Flakes from oncology, per her rec's, will likely need Gleevec after surgery. Dr. Welton Flakes will arrange for the appropriate followup for the patient after discharge. Currently on a clear liquid diet, diet advancement as per general surgery, NG discontinued today.  Acute blood loss anemia S/p 8 units of PRBCs during hospitalization. Hemoglobin slowly trending down.  Atrial fibrillation Rate controlled, in sinus rhythm. Antiplatelets held second GI bleeding.   Acute on CKD ARF resolved.   Spingomonas Paucimobilis UTI Recent urinary tract manipulation/penile prosthesis implantation: completed 7 days of rocephin.  Leukocytosis ? stress margination. Resolved.  History of CVA CT head without acute findings. No focal deficits at this time. Antiplatelets held secondary to GI bleed.  History of depression Continue Seroquel.  Uncontrolled type II DM Fairly controlled.  Altered mental status Possibly secondary to symptomatic anemia and UTI, resolved.  Recent penile implant and circumcision No acute findings. Discussed by Dr Waymon Amato with Dr. Brunilda Payor on 2/20-sutures are self absorbable and okay to use condom catheter. Outpatient followup.  Mild hypernatremia and hyperchloremia Improved.  Lactic acidosis Likely secondary to hypoperfusion from GI bleeding and anemia. Resolved.  Hypokalemia Resolved with replacement.   Hypertension Blood pressure not well controlled.  Add amlodipine to his regimen.  Severe protein calorie malnutrition Advance oral diet as tolerated.   DVT Prophylaxis: SCD's  Code Status:  DNR  Disposition: home with health   Subjective: No specific concerns, he is ambulating the halls and feels like he has more energy.  Objective: Weight change:   Intake/Output Summary (Last 24 hours) at 04/20/12 1228 Last data filed at 04/20/12 0710  Gross per 24 hour  Intake 2243.75 ml  Output   1775 ml  Net 468.75 ml   Blood pressure 193/70, pulse 67, temperature 98.2 F (36.8 C), temperature source Oral, resp. rate 18, height 5\' 6"  (1.676 m), weight 85.73 kg (189 lb), SpO2 98.00%. Patient Vitals for the past 24 hrs:  BP Temp Temp src Pulse Resp SpO2  04/20/12 0650 193/70 mmHg 98.2 F (36.8 C) Oral 67 18 98 %  04/19/12 2226 169/62 mmHg 97.9 F (36.6 C) Axillary 67 20 96 %  04/19/12 1330 162/43 mmHg 98.8 F (37.1 C) Oral 64 18 99 %    Physical Exam: General: Awake, Oriented, No acute distress. HEENT: EOMI. NG discontinued. Neck: Supple CV: S1 and S2 Lungs: Clear to ascultation bilaterally Abdomen: Soft, Nontender, Nondistended, +bowel sounds. Bandages in place. Ext: Good pulses. Trace edema.  Lab Results: Basic Metabolic Panel:  Recent Labs Lab 04/17/12 0658 04/20/12 0520  NA 146* 143  K 4.5 3.1*  CL 113* 109  CO2 26 25  GLUCOSE 128* 111*  BUN 9 5*  CREATININE 1.04 0.83  CALCIUM 7.9* 8.0*   Liver Function Tests: No results found for this basename: AST, ALT, ALKPHOS, BILITOT, PROT, ALBUMIN,  in the last 168 hours No results found for this basename: LIPASE, AMYLASE,  in the last 168 hours No results found for this basename: AMMONIA,  in the last 168 hours CBC:  Recent Labs Lab 04/18/12 1002 04/20/12 0520  WBC 14.8* 10.2  HGB 8.2* 7.6*  HCT 25.5* 22.8*  MCV 91.7 86.7  PLT 247 248   Cardiac Enzymes: No results found for this basename: CKTOTAL, CKMB, CKMBINDEX, TROPONINI,  in the last 168 hours BNP: No components found with this basename: POCBNP,  CBG:  Recent Labs Lab 04/19/12 1601 04/19/12 2023 04/20/12 0005 04/20/12 0416 04/20/12 0742   GLUCAP 162* 155* 127* 111* 112*     Micro Results: Recent Results (from the past 240 hour(s))  SURGICAL PCR SCREEN     Status: None   Collection Time    04/15/12 10:28 AM      Result Value Range Status   MRSA, PCR NEGATIVE  NEGATIVE Final   Staphylococcus aureus NEGATIVE  NEGATIVE Final   Comment:            The Xpert SA Assay (FDA     approved for NASAL specimens     in patients over 6 years of age),     is one component of     a comprehensive surveillance     program.  Test performance has     been validated by [employer] for patients greater     than or equal to 92 year old.     It is not intended     to diagnose infection nor to     guide or monitor treatment.    Studies/Results: Ct Abdomen Pelvis Wo Contrast  04/06/2012  *RADIOLOGY REPORT*  Clinical Data: Left upper quadrant mass on ultrasound  CT ABDOMEN AND PELVIS WITHOUT CONTRAST  Technique:  Multidetector CT imaging of the abdomen and pelvis was performed following the standard protocol without intravenous contrast.  Comparison: Ultrasound of the kidneys of 04/05/2012  Findings: The lung bases are clear.  There is moderate cardiomegaly present.  No pericardial effusion is seen.  The liver is unremarkable in the unenhanced state.  Faintly calcified gallstones are noted within the somewhat distended gallbladder and the gallbladder wall is slightly thickened as well and clinical correlation is recommended to exclude cholecystitis.  The pancreas appears somewhat atrophic and the pancreatic duct is not dilated. The adrenal glands are both slightly nodular most consistent with adrenal adenomas.  Metastatic involvement would be difficult to exclude.  The spleen is normal in size.  There is a large rounded soft tissue mass within the left upper quadrant as noted by ultrasound which appears to be intimately associated with the greater curvature of the stomach.  This mass measures approximately 11.1 x 12.7 x 13.2 cm and is  somewhat inhomogeneous in attenuation on this unenhanced study.  The primary considerations are that of a gastrointestinal stromal tumor (G I S T tumor), gastric carcinoma or massive adenopathy as with lymphoma. Biopsy could be performed in view of the location of this mass.  The kidneys are unremarkable in the unenhanced state.  No renal calculi are noted.  No adenopathy is seen.   The abdominal aorta is normal in caliber.  The urinary bladder is unremarkable.  A reservoir for penile prosthesis is noted just anterior to the urinary bladder.  The prostate is slightly prominent.  No abnormality of the colon is seen.  The appendix is unremarkable.  There are degenerative changes diffusely throughout the facet joints of the lower lumbar spine.  IMPRESSION:  1.  Large somewhat inhomogeneous rounded soft tissue mass in the left upper quadrant appears to be intimately associated with the greater curvature of the stomach and may represent a GIST tumor versus gastric carcinoma  carcinoma  carcinoma or adenopathy as with lymphoma.  Biopsy could be performed. 2.  Faintly calcified gallstones within a somewhat thick-walled gallbladder.  Rule out acute cholecystitis. 3.  Nodular adrenal glands.  Possibly adrenal adenomas but cannot exclude metastatic involvement.   Original Report Authenticated By: Dwyane Dee, [name]    Dg Chest 2 View  04/05/2012  *RADIOLOGY REPORT*  Clinical Data: Altered mental status.  CHEST - 2 VIEW  Comparison: 03/03/2012.  Findings: The cardiac silhouette, mediastinal and hilar contours are stable.  Stable surgical changes from bypass surgery.  No acute pulmonary findings.  No pleural effusion or pneumothorax.  The bony thorax is intact.  IMPRESSION: No acute cardiopulmonary findings.   Original Report Authenticated By: Rudie Meyer, [name]    Ct Head Wo Contrast  04/05/2012  *RADIOLOGY REPORT*  Clinical Data: Weakness and altered mental status.  CT HEAD WITHOUT CONTRAST  Technique:  Contiguous axial  images were obtained from the base of the skull through the vertex without contrast.  Comparison: 08/22/2010.  Findings: Stable age related cerebral atrophy, ventriculomegaly and periventricular white matter disease.  No extra-axial fluid collections.  No CT findings for hemispheric infarction and/or intracranial hemorrhage.  No mass lesions.  The brainstem and cerebellum are normal and stable. There is a remote right cerebellar hemispheric infarction.  The bony structures are intact.  No skull fracture.  Mild mucoperiosteal thickening in the left half of the sphenoid sinus and scattered ethmoid air cells.  The mastoid air cells are clear.  IMPRESSION:  1. Stable age related cerebral atrophy, ventriculomegaly and periventricular white matter disease. 2.  Remote right-sided cerebellar infarction. 3.  No acute intracranial findings or mass lesion.   Original Report Authenticated By: Rudie Meyer, [name]    US Renal  04/05/2012  *RADIOLOGY REPORT*  Clinical Data: Acute renal failure  RENAL/URINARY TRACT ULTRASOUND COMPLETE  Comparison:  03/10/2006 CT  Findings:  Right Kidney:  Measures 10.8 cm.  No hydronephrosis.  1.5 x 1.3 cm interpolar hypoechoic lesion is incompletely characterized.  Left Kidney:  Measures 10.5 cm.  No hydronephrosis or focal abnormality.  Bladder:  Not visualized.  The patient has a Foley catheter in place.  Incidental note is made of a mixed cystic and solid mass in the left upper quadrant.  IMPRESSION: No hydronephrosis.  Incompletely characterized 1.5 cm hypoechoic right renal lesion, favored to reflect a mildly complex cyst.  Incompletely characterized 15 cm left upper quadrant mass.  Contrast enhanced CT or MRI recommended to further evaluate the above findings.   Original Report Authenticated By: Jearld Lesch, [name]    US Biopsy  04/11/2012  *RADIOLOGY REPORT*  ULTRASOUND-GUIDED CORE BIOPSY  Date: 04/11/2012  Clinical History: 72 year old male with areas of recently diagnosed left  upper quadrant mass appears pedunculated and exophytic from the greater curvature of the stomach.  He presents for ultrasound- guided biopsy to facilitate tissue diagnosis.  Procedures Performed: 1. Ultrasound-guided core biopsy  Interventional Radiologist:  [name], MD  Sedation: Moderate (conscious) sedation was used.  One mg Versed, 50 mcg Fentanyl were administered intravenously.  The patient's vital signs were monitored continuously by radiology nursing throughout the procedure.  Sedation Time: 10 minutes  Fluoroscopy time: None  Contrast volume: None  PROCEDURE/FINDINGS:   Informed consent was obtained from the patient following explanation of the procedure, risks, benefits and alternatives. The patient understands, agrees and consents for the procedure. All questions were addressed. A time out was performed.  Maximal barrier sterile technique utilized including  caps, mask, sterile gowns, sterile gloves, large sterile drape, hand hygiene, and betadine skin prep.  The left upper quadrant was interrogated with ultrasound.  There is a large complex cystic and solid mass in the left upper quadrant which is inseparable from the stomach.  The internal cystic area likely represent areas of internal necrosis. A suitable skin entry site was selected marked.  Local anesthesia was obtained by infiltration of 1% lidocaine.  Under direct sonographic guidance, a 17 gauge trocar needle was advanced through the abdominal wall and into the mass.  Several 18-gauge core biopsies were then coaxially obtained from the solid portion of the mass.  Biopsies were placed in formalin.  As the 17 gauge outer cannula was removed, the biopsy tract was embolized with a Gelfoam slurry.  Post biopsy imaging demonstrated no active hemorrhage or perilesional hematoma.  The patient tolerated the procedure well.  No immediate complication.  IMPRESSION:  Technically successful ultrasound guided core biopsy of left upper quadrant mass  lesion which is likely arising from the gastric wall.  Signed,  [name], MD Vascular & Interventional Radiologist [facility]   Original Report Authenticated By: [name], [name]     Medications: Scheduled Meds: . enoxaparin  40 mg Subcutaneous Q24H  . fluticasone  1 spray Each Nare Daily  . insulin aspart  0-9 Units Subcutaneous Q4H  .  morphine injection  1-2 mg Intravenous Once  . pantoprazole (PROTONIX) IV  40 mg Intravenous Q12H  . potassium chloride  10 mEq Intravenous Q1 Hr x 4  . tamsulosin  0.4 mg Oral Daily      LOS: 15 days   REDDY,SRIKAR A [name]  04/20/2012, 12:28 PM Pager: [phone]  If 7PM-7AM, please contact night-coverage [email or web address] Password [email or web address]

## 2012-04-20 NOTE — Clinical Documentation Improvement (Signed)
MALNUTRITION DOCUMENTATION CLARIFICATION  THIS DOCUMENT IS NOT A PERMANENT PART OF THE MEDICAL RECORD  TO RESPOND TO THE THIS QUERY, FOLLOW THE INSTRUCTIONS BELOW:  1. If needed, update documentation for the patient's encounter via the notes activity.  2. Access this query again and click edit on the In [employer].  3. After updating, or not, click F2 to complete all highlighted (required) fields concerning your review. Select "additional documentation in the medical record" OR "no additional documentation provided".  4. Click Sign note button.  5. The deficiency will fall out of your In Basket *Please let us know if you are not able to complete this workflow by phone or e-mail (listed below).  Please update your documentation within the medical record to reflect your response to this query.                                                                                        04/20/12   Dear Dr. Betti Cruz / Associates,  In a better effort to capture your patient's severity of illness, reflect appropriate length of stay and utilization of resources, a review of the patient medical record has revealed the following indicators.    Based on your clinical judgment, please clarify and document in a progress note and/or discharge summary the clinical condition associated with the following supporting information:  In responding to this query please exercise your independent judgment.  The fact that a query is asked, does not imply that any particular answer is desired or expected. Possible Clinical Conditions?  _______Mild Malnutrition  _______Moderate Malnutrition _______Severe Malnutrition   _______Protein Calorie Malnutrition _______Severe Protein Calorie Malnutrition _______Other Condition _______Cannot clinically determine     Supporting Information:  Risk Factors: Ileus beginning to resolve, clamping ngt, sips of clear liquid per 3/04 progress notes..  Consider TNA if cannot  establish enteral nutrition per 3/04 progress notes.   Signs & Symptoms: Ht: 5'6"     Wt: 189lb   Treatment: 04/20/12 Diet liberalization per MD discretion. Resume Resource Breeze TID w/meals.  Nutrition Consult: 04/20/12: Inadequate oral intake related to omission of energy defense foods.   You may use possible, probable, or suspect with inpatient documentation. possible, probable, suspected diagnoses MUST be documented at the time of discharge  Reviewed: additional documentation in the medical record  Thank You,  Marciano Sequin,  Clinical Documentation Specialist:  Pager: [phone]  Phone: [phone]  Health Information Management [place]

## 2012-04-21 DIAGNOSIS — R7309 Other abnormal glucose: Secondary | ICD-10-CM

## 2012-04-21 LAB — BASIC METABOLIC PANEL
BUN: 4 mg/dL — ABNORMAL LOW (ref 6–23)
Chloride: 111 mEq/L (ref 96–112)
Creatinine, Ser: 0.9 mg/dL (ref 0.50–1.35)
GFR calc Af Amer: >90 mL/min (ref >90)
GFR calc non Af Amer: 84 mL/min — ABNORMAL LOW (ref >90)
Glucose, Bld: 100 mg/dL — ABNORMAL HIGH (ref 70–99)
Potassium: 3.4 mEq/L — ABNORMAL LOW (ref 3.5–5.1)

## 2012-04-21 LAB — MAGNESIUM: Magnesium: 1.4 mg/dL — ABNORMAL LOW (ref 1.5–2.5)

## 2012-04-21 LAB — CBC
HCT: 23.6 % — ABNORMAL LOW (ref 39.0–52.0)
Hemoglobin: 8 g/dL — ABNORMAL LOW (ref 13.0–17.0)
MCHC: 33.9 g/dL (ref 30.0–36.0)
MCV: 86.4 fL (ref 78.0–100.0)
WBC: 7.3 10*3/uL (ref 4.0–10.5)

## 2012-04-21 MED ORDER — POTASSIUM CHLORIDE 10 MEQ/100ML IV SOLN
10.0000 meq | INTRAVENOUS | Status: AC
  Administered 2012-04-21 (×4): 10 meq via INTRAVENOUS
  Filled 2012-04-21: qty 400

## 2012-04-21 MED ORDER — AMLODIPINE BESYLATE 10 MG PO TABS
10.0000 mg | ORAL_TABLET | Freq: Every day | ORAL | Status: DC
  Administered 2012-04-22 – 2012-04-23 (×2): 10 mg via ORAL
  Filled 2012-04-21 (×2): qty 1

## 2012-04-21 MED ORDER — QUETIAPINE FUMARATE 200 MG PO TABS
200.0000 mg | ORAL_TABLET | Freq: Every day | ORAL | Status: DC
  Administered 2012-04-21 – 2012-04-22 (×2): 200 mg via ORAL
  Filled 2012-04-21 (×3): qty 1

## 2012-04-21 MED ORDER — MAGNESIUM SULFATE 40 MG/ML IJ SOLN
2.0000 g | Freq: Once | INTRAMUSCULAR | Status: AC
  Administered 2012-04-21: 2 g via INTRAVENOUS
  Filled 2012-04-21: qty 50

## 2012-04-21 NOTE — Progress Notes (Signed)
6 Days Post-Op  Subjective: Tolerating clears, had BM, requests home dose of seroquel  Objective: Vital signs in last 24 hours: Temp:  [98.4 F (36.9 C)-99.2 F (37.3 C)] 98.4 F (36.9 C) (03/06 0625) Pulse Rate:  [72-89] 72 (03/06 0625) Resp:  [18] 18 (03/06 0625) BP: (153-180)/(61-77) 169/61 mmHg (03/06 0625) SpO2:  [98 %] 98 % (03/06 0625) Last BM Date: 04/20/12  Intake/Output from previous day: 03/05 0701 - 03/06 0700 In: 4498.3 [P.O.:1160; I.V.:3138.3; IV Piggyback:200] Out: 3800 [Urine:3800] Intake/Output this shift:    General appearance: alert and cooperative Resp: clear to auscultation bilaterally GI: soft, incision CDI, +BS Ext: calves soft  Lab Results:   Recent Labs  04/20/12 0520 04/21/12 0510  WBC 10.2 7.3  HGB 7.6* 8.0*  HCT 22.8* 23.6*  PLT 248 260   BMET  Recent Labs  04/20/12 0520 04/21/12 0510  NA 143 145  K 3.1* 3.4*  CL 109 111  CO2 25 27  GLUCOSE 111* 100*  BUN 5* 4*  CREATININE 0.83 0.90  CALCIUM 8.0* 8.3*   PT/INR No results found for this basename: LABPROT, INR,  in the last 72 hours ABG No results found for this basename: PHART, PCO2, PO2, HCO3,  in the last 72 hours  Studies/Results: No results found.  Anti-infectives: Anti-infectives   Start     Dose/Rate Route Frequency Ordered Stop   04/15/12 1930  ceFAZolin (ANCEF) IVPB 1 g/50 mL premix     1 g 100 mL/hr over 30 Minutes Intravenous Every 6 hours 04/15/12 1800 04/16/12 0715   04/15/12 0800  [MAR Hold]  ceFAZolin (ANCEF) IVPB 2 g/50 mL premix     (On MAR Hold since 04/15/12 1228)   2 g 100 mL/hr over 30 Minutes Intravenous On call to O.R. 04/15/12 0752 04/15/12 1315   04/08/12 0845  cefTRIAXone (ROCEPHIN) 1 g in dextrose 5 % 50 mL IVPB  Status:  Discontinued     1 g 100 mL/hr over 30 Minutes Intravenous Every 24 hours 04/08/12 0832 04/12/12 1402   04/06/12 1800  cefTRIAXone (ROCEPHIN) 1 g in dextrose 5 % 50 mL IVPB  Status:  Discontinued     1 g 100 mL/hr over 30  Minutes Intravenous Every 24 hours 04/06/12 1504 04/07/12 1515   04/05/12 1615  cefTRIAXone (ROCEPHIN) 2 g in dextrose 5 % 50 mL IVPB  Status:  Discontinued     2 g 100 mL/hr over 30 Minutes Intravenous Every 24 hours 04/05/12 1600 04/06/12 1504      Assessment/Plan: s/p Procedure(s): Partial Gastrectomy (N/A) S/P partial gastrectomy for GIST POD#6 FEN - advance to full liquids, decrease IVF Insomina - resume seroquel at home dose Anemia - improved  LOS: 16 days    Emmalia Heyboer E 04/21/2012

## 2012-04-21 NOTE — Progress Notes (Signed)
Patient ID: [name]  male  WUJ:[id number]    DOB: [birth year]    DOA: 04/05/2012  PCP: [name], MD  Assessment/Plan: Subacute upper GI bleeding Negative EGD on 2/19. Resolved. [facility] evaluated the patient, EGD and C-scope unrevealing.   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 Lavera Guise d/w Dr Welton Flakes from oncology, per her rec's, will likely need Gleevec after surgery. Dr. Welton Flakes will arrange for the appropriate followup for the patient after discharge.  Diet advanced to full liquid diet, diet advancement as per general surgery.  Acute blood loss anemia S/p 8 units of PRBCs during hospitalization. Hemoglobin slowly trending down, unchanged from yesterday.  Atrial fibrillation Rate controlled, in sinus rhythm. Antiplatelets held second GI bleeding.   Acute on CKD ARF resolved.   Spingomonas Paucimobilis UTI Recent urinary tract manipulation/penile prosthesis implantation: completed 7 days of rocephin.  Leukocytosis ? stress margination. Resolved.  History of CVA CT head without acute findings. No focal deficits at this time. Antiplatelets held secondary to GI bleed.  History of depression Continue Seroquel.  Uncontrolled type II DM Fairly controlled.  Altered mental status Possibly secondary to symptomatic anemia and UTI, resolved.  Recent penile implant and circumcision No acute findings. Discussed by Dr Waymon Amato with Dr. Brunilda Payor on 2/20-sutures are self absorbable and okay to use condom catheter. Outpatient followup.  Mild hypernatremia and hyperchloremia Improved.  Lactic acidosis Likely secondary to hypoperfusion from GI bleeding and anemia. Resolved.  Hypokalemia/hypomagnesemia Replace magnesium and potassium as needed.  Hypertension Blood pressure not well controlled.  Increase dose of amlodipine.    Severe protein calorie malnutrition Advance oral diet as tolerated.   DVT Prophylaxis:  SCD's  Code Status: DNR  Disposition: home with health   Subjective: No specific concerns.  Feeling better.  Objective: Weight change:   Intake/Output Summary (Last 24 hours) at 04/21/12 1301 Last data filed at 04/21/12 1100  Gross per 24 hour  Intake 4138.34 ml  Output   4150 ml  Net -11.66 ml   Blood pressure 169/61, pulse 72, temperature 98.4 F (36.9 C), temperature source Oral, resp. rate 18, height 5\' 6"  (1.676 m), weight 85.73 kg (189 lb), SpO2 98.00%. Patient Vitals for the past 24 hrs:  BP Temp Temp src Pulse Resp SpO2  04/21/12 0625 169/61 mmHg 98.4 F (36.9 C) Oral 72 18 98 %  04/21/12 0000 172/77 mmHg - - 89 - -  04/20/12 2203 180/72 mmHg 98.6 F (37 C) Oral 73 18 98 %  04/20/12 1359 153/72 mmHg 99.2 F (37.3 C) Oral 72 18 98 %    Physical Exam: General: Awake, Oriented, No acute distress. HEENT: EOMI. NG discontinued. Neck: Supple CV: S1 and S2 Lungs: Clear to ascultation bilaterally Abdomen: Soft, Nontender, Nondistended, +bowel sounds. Bandages in place. Ext: Good pulses. Trace edema.  Lab Results: Basic Metabolic Panel:  Recent Labs Lab 04/20/12 0520 04/21/12 0510  NA 143 145  K 3.1* 3.4*  CL 109 111  CO2 25 27  GLUCOSE 111* 100*  BUN 5* 4*  CREATININE 0.83 0.90  CALCIUM 8.0* 8.3*  MG  --  1.4*   Liver Function Tests: No results found for this basename: AST, ALT, ALKPHOS, BILITOT, PROT, ALBUMIN,  in the last 168 hours No results found for this basename: LIPASE, AMYLASE,  in the last 168 hours No results found for this basename: AMMONIA,  in the last 168 hours CBC:  Recent Labs Lab 04/20/12 0520  04/21/12 0510  WBC 10.2 7.3  HGB 7.6* 8.0*  HCT 22.8* 23.6*  MCV 86.7 86.4  PLT 248 260   Cardiac Enzymes: No results found for this basename: CKTOTAL, CKMB, CKMBINDEX, TROPONINI,  in the last 168 hours BNP: No components found with this basename: POCBNP,  CBG:  Recent Labs Lab 04/20/12 1553 04/20/12 2030 04/20/12 2358  04/21/12 0351 04/21/12 0821  GLUCAP 102* 109* 96 94 98     Micro Results: Recent Results (from the past 240 hour(s))  SURGICAL PCR SCREEN     Status: None   Collection Time    04/15/12 10:28 AM      Result Value Range Status   MRSA, PCR NEGATIVE  NEGATIVE Final   Staphylococcus aureus NEGATIVE  NEGATIVE Final   Comment:            The Xpert SA Assay (FDA     approved for NASAL specimens     in patients over 70 years of age),     is one component of     a comprehensive surveillance     program.  Test performance has     been validated by [employer] for patients greater     than or equal to 21 year old.     It is not intended     to diagnose infection nor to     guide or monitor treatment.    Studies/Results: Ct Abdomen Pelvis Wo Contrast  04/06/2012  *RADIOLOGY REPORT*  Clinical Data: Left upper quadrant mass on ultrasound  CT ABDOMEN AND PELVIS WITHOUT CONTRAST  Technique:  Multidetector CT imaging of the abdomen and pelvis was performed following the standard protocol without intravenous contrast.  Comparison: Ultrasound of the kidneys of 04/05/2012  Findings: The lung bases are clear.  There is moderate cardiomegaly present.  No pericardial effusion is seen.  The liver is unremarkable in the unenhanced state.  Faintly calcified gallstones are noted within the somewhat distended gallbladder and the gallbladder wall is slightly thickened as well and clinical correlation is recommended to exclude cholecystitis.  The pancreas appears somewhat atrophic and the pancreatic duct is not dilated. The adrenal glands are both slightly nodular most consistent with adrenal adenomas.  Metastatic involvement would be difficult to exclude.  The spleen is normal in size.  There is a large rounded soft tissue mass within the left upper quadrant as noted by ultrasound which appears to be intimately associated with the greater curvature of the stomach.  This mass measures approximately 11.1 x 12.7  x 13.2 cm and is somewhat inhomogeneous in attenuation on this unenhanced study.  The primary considerations are that of a gastrointestinal stromal tumor (G I S T tumor), gastric carcinoma or massive adenopathy as with lymphoma. Biopsy could be performed in view of the location of this mass.  The kidneys are unremarkable in the unenhanced state.  No renal calculi are noted.  No adenopathy is seen.   The abdominal aorta is normal in caliber.  The urinary bladder is unremarkable.  A reservoir for penile prosthesis is noted just anterior to the urinary bladder.  The prostate is slightly prominent.  No abnormality of the colon is seen.  The appendix is unremarkable.  There are degenerative changes diffusely throughout the facet joints of the lower lumbar spine.  IMPRESSION:  1.  Large somewhat inhomogeneous rounded soft tissue mass in the left upper quadrant appears to be intimately associated with the greater curvature of the  stomach and may represent a GIST tumor versus gastric carcinoma  carcinoma  carcinoma or adenopathy as with lymphoma.  Biopsy could be performed. 2.  Faintly calcified gallstones within a somewhat thick-walled gallbladder.  Rule out acute cholecystitis. 3.  Nodular adrenal glands.  Possibly adrenal adenomas but cannot exclude metastatic involvement.   Original Report Authenticated By: Dwyane Dee, [name]    Dg Chest 2 View  04/05/2012  *RADIOLOGY REPORT*  Clinical Data: Altered mental status.  CHEST - 2 VIEW  Comparison: 03/03/2012.  Findings: The cardiac silhouette, mediastinal and hilar contours are stable.  Stable surgical changes from bypass surgery.  No acute pulmonary findings.  No pleural effusion or pneumothorax.  The bony thorax is intact.  IMPRESSION: No acute cardiopulmonary findings.   Original Report Authenticated By: Rudie Meyer, [name]    Ct Head Wo Contrast  04/05/2012  *RADIOLOGY REPORT*  Clinical Data: Weakness and altered mental status.  CT HEAD WITHOUT CONTRAST  Technique:   Contiguous axial images were obtained from the base of the skull through the vertex without contrast.  Comparison: 08/22/2010.  Findings: Stable age related cerebral atrophy, ventriculomegaly and periventricular white matter disease.  No extra-axial fluid collections.  No CT findings for hemispheric infarction and/or intracranial hemorrhage.  No mass lesions.  The brainstem and cerebellum are normal and stable. There is a remote right cerebellar hemispheric infarction.  The bony structures are intact.  No skull fracture.  Mild mucoperiosteal thickening in the left half of the sphenoid sinus and scattered ethmoid air cells.  The mastoid air cells are clear.  IMPRESSION:  1. Stable age related cerebral atrophy, ventriculomegaly and periventricular white matter disease. 2.  Remote right-sided cerebellar infarction. 3.  No acute intracranial findings or mass lesion.   Original Report Authenticated By: Rudie Meyer, [name]    US Renal  04/05/2012  *RADIOLOGY REPORT*  Clinical Data: Acute renal failure  RENAL/URINARY TRACT ULTRASOUND COMPLETE  Comparison:  03/10/2006 CT  Findings:  Right Kidney:  Measures 10.8 cm.  No hydronephrosis.  1.5 x 1.3 cm interpolar hypoechoic lesion is incompletely characterized.  Left Kidney:  Measures 10.5 cm.  No hydronephrosis or focal abnormality.  Bladder:  Not visualized.  The patient has a Foley catheter in place.  Incidental note is made of a mixed cystic and solid mass in the left upper quadrant.  IMPRESSION: No hydronephrosis.  Incompletely characterized 1.5 cm hypoechoic right renal lesion, favored to reflect a mildly complex cyst.  Incompletely characterized 15 cm left upper quadrant mass.  Contrast enhanced CT or MRI recommended to further evaluate the above findings.   Original Report Authenticated By: Jearld Lesch, [name]    US Biopsy  04/11/2012  *RADIOLOGY REPORT*  ULTRASOUND-GUIDED CORE BIOPSY  Date: 04/11/2012  Clinical History: 72 year old male with areas of recently  diagnosed left upper quadrant mass appears pedunculated and exophytic from the greater curvature of the stomach.  He presents for ultrasound- guided biopsy to facilitate tissue diagnosis.  Procedures Performed: 1. Ultrasound-guided core biopsy  Interventional Radiologist:  [name], MD  Sedation: Moderate (conscious) sedation was used.  One mg Versed, 50 mcg Fentanyl were administered intravenously.  The patient's vital signs were monitored continuously by radiology nursing throughout the procedure.  Sedation Time: 10 minutes  Fluoroscopy time: None  Contrast volume: None  PROCEDURE/FINDINGS:   Informed consent was obtained from the patient following explanation of the procedure, risks, benefits and alternatives. The patient understands, agrees and consents for the procedure. All questions were addressed. A  time out was performed.  Maximal barrier sterile technique utilized including caps, mask, sterile gowns, sterile gloves, large sterile drape, hand hygiene, and betadine skin prep.  The left upper quadrant was interrogated with ultrasound.  There is a large complex cystic and solid mass in the left upper quadrant which is inseparable from the stomach.  The internal cystic area likely represent areas of internal necrosis. A suitable skin entry site was selected marked.  Local anesthesia was obtained by infiltration of 1% lidocaine.  Under direct sonographic guidance, a 17 gauge trocar needle was advanced through the abdominal wall and into the mass.  Several 18-gauge core biopsies were then coaxially obtained from the solid portion of the mass.  Biopsies were placed in formalin.  As the 17 gauge outer cannula was removed, the biopsy tract was embolized with a Gelfoam slurry.  Post biopsy imaging demonstrated no active hemorrhage or perilesional hematoma.  The patient tolerated the procedure well.  No immediate complication.  IMPRESSION:  Technically successful ultrasound guided core biopsy of left upper  quadrant mass lesion which is likely arising from the gastric wall.  Signed,  [name], MD Vascular & Interventional Radiologist [facility] Radiology   Original Report Authenticated By: [name], [name]     Medications: Scheduled Meds: . amLODipine  5 mg Oral Daily  . enoxaparin  40 mg Subcutaneous Q24H  . feeding supplement  1 Container Oral TID BM  . fluticasone  1 spray Each Nare Daily  . insulin aspart  0-9 Units Subcutaneous Q4H  .  morphine injection  1-2 mg Intravenous Once  . pantoprazole (PROTONIX) IV  40 mg Intravenous Q12H  . QUEtiapine  200 mg Oral Q2000  . tamsulosin  0.4 mg Oral Daily      LOS: 16 days   REDDY,SRIKAR A [name]  04/21/2012, 1:01 PM Pager: [phone]  If 7PM-7AM, please contact night-coverage [email or web address] Password [email or web address]

## 2012-04-22 LAB — GLUCOSE, CAPILLARY
Glucose-Capillary: 99 mg/dL (ref 70–99)
Glucose-Capillary: 135 mg/dL — ABNORMAL HIGH (ref 70–99)
Glucose-Capillary: 219 mg/dL — ABNORMAL HIGH (ref 70–99)
Glucose-Capillary: 140 mg/dL — ABNORMAL HIGH (ref 70–99)

## 2012-04-22 MED ORDER — PANTOPRAZOLE SODIUM 40 MG PO TBEC
40.0000 mg | DELAYED_RELEASE_TABLET | Freq: Two times a day (BID) | ORAL | Status: DC
  Administered 2012-04-22 – 2012-04-23 (×2): 40 mg via ORAL
  Filled 2012-04-22 (×2): qty 1

## 2012-04-22 NOTE — Progress Notes (Signed)
Patient ID: [name], male   DOB: 1940/07/11, 72 y.o.   MRN: [id number] 7 Days Post-Op  Subjective: No complaints this morning except that he wants something to eat. Tolerating full liquids well. Had a normal bowel movement.  Objective: Vital signs in last 24 hours: Temp:  [97.3 F (36.3 C)-98.4 F (36.9 C)] 97.3 F (36.3 C) (03/07 0540) Pulse Rate:  [82-100] 100 (03/07 0540) Resp:  [18] 18 (03/07 0540) BP: (147-172)/(62-75) 155/75 mmHg (03/07 0540) SpO2:  [97 %-99 %] 99 % (03/07 0540) Last BM Date: 04/21/12  Intake/Output from previous day: 03/06 0701 - 03/07 0700 In: 1751.7 [P.O.:960; I.V.:791.7] Out: 1450 [Urine:1450] Intake/Output this shift: Total I/O In: -  Out: 500 [Urine:500]  General appearance: alert, cooperative and no distress GI: normal findings: soft, non-tender Incision/Wound: clean and dry without apparent infection  Lab Results:   Recent Labs  04/20/12 0520 04/21/12 0510  WBC 10.2 7.3  HGB 7.6* 8.0*  HCT 22.8* 23.6*  PLT 248 260   BMET  Recent Labs  04/20/12 0520 04/21/12 0510  NA 143 145  K 3.1* 3.4*  CL 109 111  CO2 25 27  GLUCOSE 111* 100*  BUN 5* 4*  CREATININE 0.83 0.90  CALCIUM 8.0* 8.3*     Studies/Results: No results found.  Anti-infectives: Anti-infectives   Start     Dose/Rate Route Frequency Ordered Stop   04/15/12 1930  ceFAZolin (ANCEF) IVPB 1 g/50 mL premix     1 g 100 mL/hr over 30 Minutes Intravenous Every 6 hours 04/15/12 1800 04/16/12 0715   04/15/12 0800  [MAR Hold]  ceFAZolin (ANCEF) IVPB 2 g/50 mL premix     (On MAR Hold since 04/15/12 1228)   2 g 100 mL/hr over 30 Minutes Intravenous On call to O.R. 04/15/12 0752 04/15/12 1315   04/08/12 0845  cefTRIAXone (ROCEPHIN) 1 g in dextrose 5 % 50 mL IVPB  Status:  Discontinued     1 g 100 mL/hr over 30 Minutes Intravenous Every 24 hours 04/08/12 0832 04/12/12 1402   04/06/12 1800  cefTRIAXone (ROCEPHIN) 1 g in dextrose 5 % 50 mL IVPB  Status:  Discontinued      1 g 100 mL/hr over 30 Minutes Intravenous Every 24 hours 04/06/12 1504 04/07/12 1515   04/05/12 1615  cefTRIAXone (ROCEPHIN) 2 g in dextrose 5 % 50 mL IVPB  Status:  Discontinued     2 g 100 mL/hr over 30 Minutes Intravenous Every 24 hours 04/05/12 1600 04/06/12 1504      Assessment/Plan: s/p Procedure(s): Partial Gastrectomy Doing well. No complications identified. Advanced to regular diet. Possible discharge tomorrow.   LOS: 17 days    HOXWORTH,BENJAMIN T 04/22/2012

## 2012-04-22 NOTE — Plan of Care (Addendum)
We will be seeing this patient for [facility] needs per referral from [facility] [place].Marland Kitchen PT OT SN and HHA services will be initiated.  Ayesha Rumpf RN, BSN Care Transition Liaison [facility]

## 2012-04-22 NOTE — Progress Notes (Signed)
Physical Therapy Treatment Patient Details Name: [name] MRN: [id number] DOB: 20-Jul-1940 Today's Date: 04/22/2012 Time: 0102-7253 PT Time Calculation (min): 25 min  PT Assessment / Plan / Recommendation Comments on Treatment Session  Patient able to ambulate and perform stairs safely. Anticipate patient will be safefor d/c home    Follow Up Recommendations  Home health PT           Equipment Recommendations  Rolling walker with 5" wheels    Recommendations for Other Services    Frequency Min 3X/week   Plan Discharge plan needs to be updated    Precautions / Restrictions Precautions Precautions: Fall Restrictions Weight Bearing Restrictions: No   Pertinent Vitals/Pain No pain    Mobility  Bed Mobility Bed Mobility: Right Sidelying to Sit;Sitting - Scoot to Edge of Bed;Sit to Supine Right Sidelying to Sit: 6: Modified independent (Device/Increase time) Sitting - Scoot to Edge of Bed: 6: Modified independent (Device/Increase time) Sit to Supine: 6: Modified independent (Device/Increase time) Details for Bed Mobility Assistance: increased time Transfers Transfers: Sit to Stand;Stand to Sit Sit to Stand: 6: Modified independent (Device/Increase time) Stand to Sit: 6: Modified independent (Device/Increase time) Ambulation/Gait Ambulation/Gait Assistance: 5: Supervision Ambulation Distance (Feet): 240 Feet Assistive device: Rolling walker Ambulation/Gait Assistance Details: Continue use of rw as patient appears significantly more steady with use Gait Pattern: Step-through pattern;Decreased step length - right;Decreased step length - left;Decreased stride length;Narrow base of support Gait velocity: WFL good cadence General Gait Details: patient steady; minimal VCs for upright posture Stairs: Yes Stairs Assistance: 5: Supervision Stairs Assistance Details (indicate cue type and reason): no assist needed Stair Management Technique: One rail Right;Forwards Number of  Stairs: 6      PT Goals Acute Rehab PT Goals PT Goal Formulation: With patient Time For Goal Achievement: 04/28/12 Potential to Achieve Goals: Good Pt will go Supine/Side to Sit: with modified independence PT Goal: Supine/Side to Sit - Progress: Met Pt will go Sit to Stand: with supervision PT Goal: Sit to Stand - Progress: Met Pt will Transfer Bed to Chair/Chair to Bed: with supervision Pt will Ambulate: 51 - 150 feet;with modified independence;with rolling walker PT Goal: Ambulate - Progress: Progressing toward goal  Visit Information  Last PT Received On: 04/22/12 Assistance Needed: +1    Subjective Data  Subjective: I really have not been able to sleep at all since the operation Patient Stated Goal: to go home   Cognition  Cognition Overall Cognitive Status: Appears within functional limits for tasks assessed/performed Arousal/Alertness: Awake/alert Orientation Level: Appears intact for tasks assessed Behavior During Session: [facility] for tasks performed    Balance  Balance Balance Assessed: Yes Dynamic Sitting Balance Dynamic Sitting - Balance Support: Feet supported Dynamic Sitting - Level of Assistance: 7: Independent Dynamic Standing Balance Dynamic Standing - Level of Assistance: 6: Modified independent (Device/Increase time) Dynamic Standing - Balance Activities: Reaching for objects;Reaching across midline  End of Session PT - End of Session Equipment Utilized During Treatment: Gait belt Activity Tolerance: Patient tolerated treatment well;Patient limited by fatigue Patient left: in bed;with call bell/phone within reach (sitting EOB (pt more comfortable)) Nurse Communication: Mobility status   GP     Fabio Asa 04/22/2012, 12:30 PM Charlotte Crumb, PT DPT  [phone]

## 2012-04-22 NOTE — Progress Notes (Signed)
Patient ID: [name]  male  ZOX:[id number]    DOB: 01/23/1941    DOA: 04/05/2012  PCP: [name], MD  Assessment/Plan: Subacute upper GI bleeding Negative EGD on 2/19. Resolved. [facility] evaluated the patient, EGD and C-scope unrevealing.   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 Lavera Guise d/w Dr Welton Flakes from oncology, per her rec's, will likely need Gleevec after surgery. Dr. Welton Flakes will arrange for the appropriate followup for the patient after discharge.  Diet advanced to full liquid diet, diet advancement as per general surgery.  Acute blood loss anemia S/p 8 units of PRBCs during hospitalization. Hemoglobin slowly trending down, unchanged from yesterday.  Atrial fibrillation Rate controlled, in sinus rhythm. Antiplatelets held second GI bleeding.   Acute on CKD ARF resolved.   Spingomonas Paucimobilis UTI Recent urinary tract manipulation/penile prosthesis implantation: completed 7 days of rocephin.  Leukocytosis ? stress margination. Resolved.  History of CVA CT head without acute findings. No focal deficits at this time. Antiplatelets held secondary to GI bleed.  History of depression Continue Seroquel.  Uncontrolled type II DM Fairly controlled.  Altered mental status Possibly secondary to symptomatic anemia and UTI, resolved.  Recent penile implant and circumcision No acute findings. Discussed by Dr Waymon Amato with Dr. Brunilda Payor on 2/20-sutures are self absorbable and okay to use condom catheter. Outpatient followup.  Mild hypernatremia and hyperchloremia Improved.  Lactic acidosis Likely secondary to hypoperfusion from GI bleeding and anemia. Resolved.  Hypokalemia/hypomagnesemia Replace magnesium and potassium as needed.  Hypertension Blood pressure not well controlled.  Increase dose of amlodipine.    Severe protein calorie malnutrition Advance oral diet as tolerated.   DVT Prophylaxis:  SCD's  Code Status: DNR  Disposition: home with health   Subjective: No specific concerns. Slept better last night with Seroquel.  Objective: Weight change:   Intake/Output Summary (Last 24 hours) at 04/22/12 1249 Last data filed at 04/22/12 1227  Gross per 24 hour  Intake 1511.67 ml  Output   1750 ml  Net -238.33 ml   Blood pressure 155/75, pulse 100, temperature 97.3 F (36.3 C), temperature source Oral, resp. rate 18, height 5\' 6"  (1.676 m), weight 85.73 kg (189 lb), SpO2 99.00%. Patient Vitals for the past 24 hrs:  BP Temp Temp src Pulse Resp SpO2  04/22/12 0540 155/75 mmHg 97.3 F (36.3 C) Oral 100 18 99 %  04/21/12 2228 147/70 mmHg 97.6 F (36.4 C) Oral 82 18 97 %  04/21/12 1405 172/62 mmHg 98.4 F (36.9 C) Oral 84 18 97 %    Physical Exam: General: Awake, Oriented, No acute distress. HEENT: EOMI. NG discontinued. Neck: Supple CV: S1 and S2 Lungs: Clear to ascultation bilaterally Abdomen: Soft, Nontender, Nondistended, +bowel sounds. Bandages in place. Ext: Good pulses. Trace edema.  Lab Results: Basic Metabolic Panel:  Recent Labs Lab 04/20/12 0520 04/21/12 0510  NA 143 145  K 3.1* 3.4*  CL 109 111  CO2 25 27  GLUCOSE 111* 100*  BUN 5* 4*  CREATININE 0.83 0.90  CALCIUM 8.0* 8.3*  MG  --  1.4*   Liver Function Tests: No results found for this basename: AST, ALT, ALKPHOS, BILITOT, PROT, ALBUMIN,  in the last 168 hours No results found for this basename: LIPASE, AMYLASE,  in the last 168 hours No results found for this basename: AMMONIA,  in the last 168 hours CBC:  Recent Labs Lab 04/20/12 0520 04/21/12 0510  WBC 10.2 7.3  HGB 7.6* 8.0*  HCT 22.8* 23.6*  MCV 86.7 86.4  PLT 248 260   Cardiac Enzymes: No results found for this basename: CKTOTAL, CKMB, CKMBINDEX, TROPONINI,  in the last 168 hours BNP: No components found with this basename: POCBNP,  CBG:  Recent Labs Lab 04/21/12 1954 04/22/12 0019 04/22/12 0412 04/22/12 0742  04/22/12 1204  GLUCAP 153* 109* 130* 99 135*     Micro Results: Recent Results (from the past 240 hour(s))  SURGICAL PCR SCREEN     Status: None   Collection Time    04/15/12 10:28 AM      Result Value Range Status   MRSA, PCR NEGATIVE  NEGATIVE Final   Staphylococcus aureus NEGATIVE  NEGATIVE Final   Comment:            The Xpert SA Assay (FDA     approved for NASAL specimens     in patients over 81 years of age),     is one component of     a comprehensive surveillance     program.  Test performance has     been validated by [employer] for patients greater     than or equal to 27 year old.     It is not intended     to diagnose infection nor to     guide or monitor treatment.    Studies/Results: Ct Abdomen Pelvis Wo Contrast  04/06/2012  *RADIOLOGY REPORT*  Clinical Data: Left upper quadrant mass on ultrasound  CT ABDOMEN AND PELVIS WITHOUT CONTRAST  Technique:  Multidetector CT imaging of the abdomen and pelvis was performed following the standard protocol without intravenous contrast.  Comparison: Ultrasound of the kidneys of 04/05/2012  Findings: The lung bases are clear.  There is moderate cardiomegaly present.  No pericardial effusion is seen.  The liver is unremarkable in the unenhanced state.  Faintly calcified gallstones are noted within the somewhat distended gallbladder and the gallbladder wall is slightly thickened as well and clinical correlation is recommended to exclude cholecystitis.  The pancreas appears somewhat atrophic and the pancreatic duct is not dilated. The adrenal glands are both slightly nodular most consistent with adrenal adenomas.  Metastatic involvement would be difficult to exclude.  The spleen is normal in size.  There is a large rounded soft tissue mass within the left upper quadrant as noted by ultrasound which appears to be intimately associated with the greater curvature of the stomach.  This mass measures approximately 11.1 x 12.7 x 13.2 cm  and is somewhat inhomogeneous in attenuation on this unenhanced study.  The primary considerations are that of a gastrointestinal stromal tumor (G I S T tumor), gastric carcinoma or massive adenopathy as with lymphoma. Biopsy could be performed in view of the location of this mass.  The kidneys are unremarkable in the unenhanced state.  No renal calculi are noted.  No adenopathy is seen.   The abdominal aorta is normal in caliber.  The urinary bladder is unremarkable.  A reservoir for penile prosthesis is noted just anterior to the urinary bladder.  The prostate is slightly prominent.  No abnormality of the colon is seen.  The appendix is unremarkable.  There are degenerative changes diffusely throughout the facet joints of the lower lumbar spine.  IMPRESSION:  1.  Large somewhat inhomogeneous rounded soft tissue mass in the left upper quadrant appears to be intimately associated with the greater curvature of the stomach and may represent a GIST tumor  versus gastric carcinoma  carcinoma  carcinoma or adenopathy as with lymphoma.  Biopsy could be performed. 2.  Faintly calcified gallstones within a somewhat thick-walled gallbladder.  Rule out acute cholecystitis. 3.  Nodular adrenal glands.  Possibly adrenal adenomas but cannot exclude metastatic involvement.   Original Report Authenticated By: Dwyane Dee, [name]    Dg Chest 2 View  04/05/2012  *RADIOLOGY REPORT*  Clinical Data: Altered mental status.  CHEST - 2 VIEW  Comparison: 03/03/2012.  Findings: The cardiac silhouette, mediastinal and hilar contours are stable.  Stable surgical changes from bypass surgery.  No acute pulmonary findings.  No pleural effusion or pneumothorax.  The bony thorax is intact.  IMPRESSION: No acute cardiopulmonary findings.   Original Report Authenticated By: Rudie Meyer, [name]    Ct Head Wo Contrast  04/05/2012  *RADIOLOGY REPORT*  Clinical Data: Weakness and altered mental status.  CT HEAD WITHOUT CONTRAST  Technique:  Contiguous  axial images were obtained from the base of the skull through the vertex without contrast.  Comparison: 08/22/2010.  Findings: Stable age related cerebral atrophy, ventriculomegaly and periventricular white matter disease.  No extra-axial fluid collections.  No CT findings for hemispheric infarction and/or intracranial hemorrhage.  No mass lesions.  The brainstem and cerebellum are normal and stable. There is a remote right cerebellar hemispheric infarction.  The bony structures are intact.  No skull fracture.  Mild mucoperiosteal thickening in the left half of the sphenoid sinus and scattered ethmoid air cells.  The mastoid air cells are clear.  IMPRESSION:  1. Stable age related cerebral atrophy, ventriculomegaly and periventricular white matter disease. 2.  Remote right-sided cerebellar infarction. 3.  No acute intracranial findings or mass lesion.   Original Report Authenticated By: Rudie Meyer, [name]    US Renal  04/05/2012  *RADIOLOGY REPORT*  Clinical Data: Acute renal failure  RENAL/URINARY TRACT ULTRASOUND COMPLETE  Comparison:  03/10/2006 CT  Findings:  Right Kidney:  Measures 10.8 cm.  No hydronephrosis.  1.5 x 1.3 cm interpolar hypoechoic lesion is incompletely characterized.  Left Kidney:  Measures 10.5 cm.  No hydronephrosis or focal abnormality.  Bladder:  Not visualized.  The patient has a Foley catheter in place.  Incidental note is made of a mixed cystic and solid mass in the left upper quadrant.  IMPRESSION: No hydronephrosis.  Incompletely characterized 1.5 cm hypoechoic right renal lesion, favored to reflect a mildly complex cyst.  Incompletely characterized 15 cm left upper quadrant mass.  Contrast enhanced CT or MRI recommended to further evaluate the above findings.   Original Report Authenticated By: Jearld Lesch, [name]    US Biopsy  04/11/2012  *RADIOLOGY REPORT*  ULTRASOUND-GUIDED CORE BIOPSY  Date: 04/11/2012  Clinical History: 72 year old male with areas of recently diagnosed  left upper quadrant mass appears pedunculated and exophytic from the greater curvature of the stomach.  He presents for ultrasound- guided biopsy to facilitate tissue diagnosis.  Procedures Performed: 1. Ultrasound-guided core biopsy  Interventional Radiologist:  [name], MD  Sedation: Moderate (conscious) sedation was used.  One mg Versed, 50 mcg Fentanyl were administered intravenously.  The patient's vital signs were monitored continuously by radiology nursing throughout the procedure.  Sedation Time: 10 minutes  Fluoroscopy time: None  Contrast volume: None  PROCEDURE/FINDINGS:   Informed consent was obtained from the patient following explanation of the procedure, risks, benefits and alternatives. The patient understands, agrees and consents for the procedure. All questions were addressed. A time out was performed.  Maximal barrier  sterile technique utilized including caps, mask, sterile gowns, sterile gloves, large sterile drape, hand hygiene, and betadine skin prep.  The left upper quadrant was interrogated with ultrasound.  There is a large complex cystic and solid mass in the left upper quadrant which is inseparable from the stomach.  The internal cystic area likely represent areas of internal necrosis. A suitable skin entry site was selected marked.  Local anesthesia was obtained by infiltration of 1% lidocaine.  Under direct sonographic guidance, a 17 gauge trocar needle was advanced through the abdominal wall and into the mass.  Several 18-gauge core biopsies were then coaxially obtained from the solid portion of the mass.  Biopsies were placed in formalin.  As the 17 gauge outer cannula was removed, the biopsy tract was embolized with a Gelfoam slurry.  Post biopsy imaging demonstrated no active hemorrhage or perilesional hematoma.  The patient tolerated the procedure well.  No immediate complication.  IMPRESSION:  Technically successful ultrasound guided core biopsy of left upper quadrant  mass lesion which is likely arising from the gastric wall.  Signed,  [name], MD Vascular & Interventional Radiologist [facility]   Original Report Authenticated By: [name], [name]     Medications: Scheduled Meds: . amLODipine  10 mg Oral Daily  . enoxaparin  40 mg Subcutaneous Q24H  . feeding supplement  1 Container Oral TID BM  . fluticasone  1 spray Each Nare Daily  . insulin aspart  0-9 Units Subcutaneous Q4H  .  morphine injection  1-2 mg Intravenous Once  . pantoprazole  40 mg Oral BID  . QUEtiapine  200 mg Oral Q2000  . tamsulosin  0.4 mg Oral Daily      LOS: 17 days   REDDY,SRIKAR A [name]  04/22/2012, 12:49 PM Pager: [phone]  If 7PM-7AM, please contact night-coverage [email or web address] Password [email or web address]

## 2012-04-23 LAB — CBC
Hemoglobin: 8.4 g/dL — ABNORMAL LOW (ref 13.0–17.0)
MCH: 28.8 pg (ref 26.0–34.0)
MCHC: 32.2 g/dL (ref 30.0–36.0)
Platelets: 364 10*3/uL (ref 150–400)

## 2012-04-23 LAB — GLUCOSE, CAPILLARY

## 2012-04-23 LAB — BASIC METABOLIC PANEL
Calcium: 8.2 mg/dL — ABNORMAL LOW (ref 8.4–10.5)
Creatinine, Ser: 0.93 mg/dL (ref 0.50–1.35)
GFR calc non Af Amer: 83 mL/min — ABNORMAL LOW (ref >90)
Glucose, Bld: 101 mg/dL — ABNORMAL HIGH (ref 70–99)
Sodium: 143 mEq/L (ref 135–145)

## 2012-04-23 MED ORDER — POTASSIUM CHLORIDE CRYS ER 20 MEQ PO TBCR: 20.0000 meq | EXTENDED_RELEASE_TABLET | Freq: Every day | ORAL | Status: DC

## 2012-04-23 MED ORDER — POTASSIUM CHLORIDE CRYS ER 20 MEQ PO TBCR
40.0000 meq | EXTENDED_RELEASE_TABLET | Freq: Once | ORAL | Status: AC
  Administered 2012-04-23: 40 meq via ORAL
  Filled 2012-04-23 (×2): qty 2

## 2012-04-23 MED ORDER — PANTOPRAZOLE SODIUM 40 MG PO TBEC: 40.0000 mg | DELAYED_RELEASE_TABLET | Freq: Two times a day (BID) | ORAL | Status: DC

## 2012-04-23 MED ORDER — QUETIAPINE FUMARATE 200 MG PO TABS: 200.0000 mg | ORAL_TABLET | Freq: Every day | ORAL | Status: AC

## 2012-04-23 MED ORDER — POLYETHYLENE GLYCOL 3350 17 G PO PACK: 17.0000 g | PACK | Freq: Every day | ORAL | Status: DC | PRN

## 2012-04-23 MED ORDER — OXYCODONE-ACETAMINOPHEN 5-325 MG PO TABS: 1.0000 | ORAL_TABLET | Freq: Four times a day (QID) | ORAL | Status: DC | PRN

## 2012-04-23 MED ORDER — AMLODIPINE BESYLATE 10 MG PO TABS: 10.0000 mg | ORAL_TABLET | Freq: Every day | ORAL | Status: DC

## 2012-04-23 MED ORDER — POLYETHYLENE GLYCOL 3350 17 G PO PACK
17.0000 g | PACK | Freq: Every day | ORAL | Status: DC | PRN
  Filled 2012-04-23: qty 1

## 2012-04-23 MED ORDER — OXYCODONE-ACETAMINOPHEN 5-325 MG PO TABS: 1.0000 | ORAL_TABLET | ORAL | Status: DC | PRN

## 2012-04-23 MED ORDER — BOOST / RESOURCE BREEZE PO LIQD: 1.0000 | Freq: Three times a day (TID) | ORAL | Status: DC

## 2012-04-23 NOTE — Progress Notes (Signed)
8 Days Post-Op  Subjective: Tolerating regular diet, ambulating and had 2 BM's yesterday.  Over all doing quite well.  Objective: Vital signs in last 24 hours: Temp:  [99.2 F (37.3 C)-99.4 F (37.4 C)] 99.4 F (37.4 C) (03/08 0535) Pulse Rate:  [80-87] 80 (03/08 0535) Resp:  [18-20] 18 (03/08 0535) BP: (156-173)/(59-77) 166/59 mmHg (03/08 0535) SpO2:  [98 %-100 %] 100 % (03/08 0535) Last BM Date: 04/21/12 Diet: Regular, 240 PO recorded, no BM recorded, Temp 99.4, BP up some, K+ is low, CBC is stab;e  Intake/Output from previous day: 03/07 0701 - 03/08 0700 In: 1060 [P.O.:240; I.V.:820] Out: 1900 [Urine:1900] Intake/Output this shift:    General appearance: alert, cooperative and no distress GI: soft, still tender, moves well, +BS, +BM.  Incision looks good.  Lab Results:   Recent Labs  04/21/12 0510 04/23/12 0515  WBC 7.3 10.6*  HGB 8.0* 8.4*  HCT 23.6* 26.1*  PLT 260 364    BMET  Recent Labs  04/21/12 0510 04/23/12 0515  NA 145 143  K 3.4* 3.0*  CL 111 107  CO2 27 29  GLUCOSE 100* 101*  BUN 4* 4*  CREATININE 0.90 0.93  CALCIUM 8.3* 8.2*   PT/INR No results found for this basename: LABPROT, INR,  in the last 72 hours  No results found for this basename: AST, ALT, ALKPHOS, BILITOT, PROT, ALBUMIN,  in the last 168 hours   Lipase  No results found for this basename: lipase     Studies/Results: No results found.  Medications: . amLODipine  10 mg Oral Daily  . enoxaparin  40 mg Subcutaneous Q24H  . feeding supplement  1 Container Oral TID BM  . fluticasone  1 spray Each Nare Daily  . insulin aspart  0-9 Units Subcutaneous Q4H  .  morphine injection  1-2 mg Intravenous Once  . pantoprazole  40 mg Oral BID  . potassium chloride  40 mEq Oral Once  . QUEtiapine  200 mg Oral Q2000  . tamsulosin  0.4 mg Oral Daily  Diagnosis Stomach, resection for tumor, greater curvature - GASTROINTESTINAL STROMAL TUMOR (GIST) (15.0 CM), SEE COMMENT. Microscopic  Comment GASTROINTESTINAL STROMAL TUMOR (GIST) Tumor size: 15 cm GIST subtype: Spindle cell Mitotic rate: 7 #mitoses/ 50 HPFs TNM pT4, pNX A [place] RUND DO Pathologist, Electronic Signature (Case signed 04/18/2012) Specimen Gross and Clinical Information  Assessment/Plan Gastric GIST tumor, s/p Partial Gastrectomy, 04/15/2012, [name] Ridges, MD. Anemia with GI bleed CAD/Afib HYPERTENSION AODM HX of Stroke,  GERD Depression  Plan:  Saline lock IV.  From our standpoint he can go home.  I will put information in AVS so he can get staples out at office next week and follow up with Dr. Lindie [name] in 2 weeks, both in our office. He needs oral pain med and I have added percocet, and plain tylenol.  Also adding PRN Miralax for possible constipation issues.   LOS: 18 days    [name],[name] 04/23/2012

## 2012-04-23 NOTE — Discharge Summary (Signed)
Physician Discharge Summary  [name] ZOX:[id number] DOB: [birth year] DOA: 04/05/2012  PCP: [name], MD  Admit date: 04/05/2012 Discharge date: 04/23/2012  Time spent: 35 minutes  Recommendations for Outpatient Follow-up:  Please followup with general surgery (Dr. Lindie Spruce) as noted in your instructions including removing staples.  Please followup with Dr. Welton Flakes, oncology, in 1 week.  Please followup with OSEI-BONSU,GEORGE, MD (PCP) in 1-2 weeks.  Please have CBC and BMET checked at the next clinic visit.  Home health PT/OT/Aide and walker arranged at discharge.  Discharge Diagnoses:  Principal Problem:   GI bleeding Active Problems:   Atrial fibrillation   Acute on chronic renal failure   Anemia   Lactic acidosis   Leukocytosis   H/O: CVA (cerebrovascular accident)   Depression   Diabetes   HTN (hypertension)   Erectile dysfunction   Acute post-hemorrhagic anemia   Left upper quadrant abdominal mass   Discharge Condition: Stable  Diet recommendation: Diabetic diet.  Filed Weights   04/06/12 2330 04/08/12 1100 04/15/12 0405  Weight: 75.1 kg (165 lb 9.1 oz) 79.652 kg (175 lb 9.6 oz) 85.73 kg (189 lb)    History of present illness:  On admission: "[name] is a 72 y.o. male with a medical history significant for coronary artery disease, hypertension, depression, diabetes, GERD, atrial fibrillation and recent admission for penile prosthesis implant; came to the hospital due to increased weakness and change in mental status according to family members on 04/05/2012."  Hospital Course:  Subacute upper GI bleeding Negative EGD on 2/19. Resolved. [facility] evaluated the patient, EGD and C-scope unrevealing.   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 Lavera Guise d/w Dr Welton Flakes from oncology, per her rec's, will likely need Gleevec after surgery. Dr. Welton Flakes will arrange for the appropriate followup for the  patient after discharge.  Diabetic diet.  Acute blood loss anemia S/p 8 units of PRBCs during hospitalization. Hemoglobin stable prior to discharge.  Atrial fibrillation Rate controlled, in sinus rhythm. Antiplatelets held second GI bleeding, resume at discharge, as patient already on Lovenox during the hospital stay prior to discharge.  Acute on CKD ARF resolved.   Spingomonas Paucimobilis UTI Recent urinary tract manipulation/penile prosthesis implantation: completed 7 days of rocephin.  Leukocytosis Resolved.  History of CVA CT head without acute findings. No focal deficits at this time. Antiplatelets held secondary to GI bleed, resumed at discharge.  History of depression Continue Seroquel.  Uncontrolled type II DM Fairly controlled. Resume home medications at discharge.  Altered mental status Possibly secondary to symptomatic anemia and UTI, resolved.  Recent penile implant and circumcision No acute findings. Discussed by Dr Waymon Amato with Dr. Brunilda Payor on 2/20, sutures are self absorbable and okay to use condom catheter. Outpatient followup.  Mild hypernatremia and hyperchloremia Improved.  Lactic acidosis Likely secondary to hypoperfusion from GI bleeding and anemia. Resolved.  Hypokalemia/hypomagnesemia Replace magnesium and potassium as needed. Give one weeks worth of supplemental potassium.  Hypertension Stable, continue amlodipine.    Severe protein calorie malnutrition Advance oral diet as tolerated.   Consultations:  GI  General surgery  Discharge Exam: Filed Vitals:   04/22/12 0540 04/22/12 1418 04/22/12 2200 04/23/12 0535  BP: 155/75 173/77 156/76 166/59  Pulse: 100 80 87 80  Temp: 97.3 F (36.3 C) 99.2 F (37.3 C) 99.4 F (37.4 C) 99.4 F (37.4 C)  TempSrc: Oral Oral Oral Oral  Resp: 18 20 18 18   Height:  Weight:      SpO2: 99% 100% 98% 100%   Discharge Instructions  Discharge Orders   Future Appointments Provider Department Dept  Phone   04/29/2012 2:30 PM [facility] [phone]   04/29/2012 3:00 PM Ladene Artist, MD [facility] MEDICAL ONCOLOGY [phone]   Future Orders Complete By Expires     Diet Carb Modified  As directed     Discharge instructions  As directed     Comments:      Please followup with general surgery (Dr. Lindie Spruce) as noted in your instructions including removing staples.  Please followup with Dr. Welton Flakes, oncology, in 1 week.  Please followup with OSEI-BONSU,GEORGE, MD (PCP) in 1-2 weeks.  Please have CBC and BMET checked at the next clinic visit.    Increase activity slowly  As directed         Medication List    STOP taking these medications       amLODipine-benazepril 10-40 MG per capsule  Commonly known as:  LOTREL     hydrochlorothiazide 25 MG tablet  Commonly known as:  HYDRODIURIL     HYDROcodone-acetaminophen 5-325 MG per tablet  Commonly known as:  NORCO/VICODIN     insulin aspart protamine-insulin aspart (70-30) 100 UNIT/ML injection  Commonly known as:  NOVOLOG 70/30     insulin glargine 100 UNIT/ML injection  Commonly known as:  LANTUS      TAKE these medications       amLODipine 10 MG tablet  Commonly known as:  NORVASC  Take 1 tablet (10 mg total) by mouth daily.     atorvastatin 40 MG tablet  Commonly known as:  LIPITOR  Take 40 mg by mouth daily.     clopidogrel 75 MG tablet  Commonly known as:  PLAVIX  Take 75 mg by mouth daily.     feeding supplement Liqd  Take 1 Container by mouth 3 (three) times daily between meals.     glimepiride 4 MG tablet  Commonly known as:  AMARYL  Take 4 mg by mouth daily before breakfast.     metFORMIN 1000 MG tablet  Commonly known as:  GLUCOPHAGE  Take 1,000 mg by mouth 2 (two) times daily with a meal.     metoprolol tartrate 25 MG tablet  Commonly known as:  LOPRESSOR  Take 12.5 mg by mouth 2 (two) times daily.     nitroGLYCERIN 0.4  MG SL tablet  Commonly known as:  NITROSTAT  Place 0.4 mg under the tongue every 5 (five) minutes as needed for chest pain.     oxyCODONE-acetaminophen 5-325 MG per tablet  Commonly known as:  PERCOCET/ROXICET  Take 1-2 tablets by mouth every 6 (six) hours as needed.     pantoprazole 40 MG tablet  Commonly known as:  PROTONIX  Take 1 tablet (40 mg total) by mouth 2 (two) times daily.     polyethylene glycol packet  Commonly known as:  MIRALAX / GLYCOLAX  Take 17 g by mouth daily as needed (He needs 1-2 smooth BM per day.).     potassium chloride SA 20 MEQ tablet  Commonly known as:  K-DUR,KLOR-CON  Take 1 tablet (20 mEq total) by mouth daily.     QUEtiapine 200 MG tablet  Commonly known as:  SEROQUEL  Take 1 tablet (200 mg total) by mouth daily at 8 pm.     sildenafil 100 MG tablet  Commonly known as:  VIAGRA  Take 100 mg by mouth daily as needed for erectile dysfunction.     tamsulosin 0.4 MG Caps  Commonly known as:  FLOMAX  Take 0.4 mg by mouth daily.     Vitamin D-3 5000 UNITS Tabs  Take 5,000 Units by mouth daily.           Follow-up Information   Follow up with WYATT, Marta Lamas, MD. Schedule an appointment as soon as possible for a visit in 5 days. (Call and ask them to have nurse remove your staples in the office.)    Contact information:   [address] [facility] [place] [address] [phone]       Follow up with Cherylynn Ridges, MD. Schedule an appointment as soon as possible for a visit in 2 weeks. (To see Dr. Lindie Spruce)    Contact information:   [address] [facility] [place] [address] [phone]       Follow up with Drue Second, MD. Schedule an appointment as soon as possible for a visit in 1 week.   Contact information:   [address] [place] [address] [phone]       Follow up with OSEI-BONSU,GEORGE, MD. Schedule an appointment as soon as possible for a visit in  1 week.   Contact information:   [address], [address] [place] [address] [phone]        The results of significant diagnostics from this hospitalization (including imaging, microbiology, ancillary and laboratory) are listed below for reference.    Significant Diagnostic Studies: Ct Abdomen Pelvis Wo Contrast  04/06/2012  *RADIOLOGY REPORT*  Clinical Data: Left upper quadrant mass on ultrasound  CT ABDOMEN AND PELVIS WITHOUT CONTRAST  Technique:  Multidetector CT imaging of the abdomen and pelvis was performed following the standard protocol without intravenous contrast.  Comparison: Ultrasound of the kidneys of 04/05/2012  Findings: The lung bases are clear.  There is moderate cardiomegaly present.  No pericardial effusion is seen.  The liver is unremarkable in the unenhanced state.  Faintly calcified gallstones are noted within the somewhat distended gallbladder and the gallbladder wall is slightly thickened as well and clinical correlation is recommended to exclude cholecystitis.  The pancreas appears somewhat atrophic and the pancreatic duct is not dilated. The adrenal glands are both slightly nodular most consistent with adrenal adenomas.  Metastatic involvement would be difficult to exclude.  The spleen is normal in size.  There is a large rounded soft tissue mass within the left upper quadrant as noted by ultrasound which appears to be intimately associated with the greater curvature of the stomach.  This mass measures approximately 11.1 x 12.7 x 13.2 cm and is somewhat inhomogeneous in attenuation on this unenhanced study.  The primary considerations are that of a gastrointestinal stromal tumor (G I S T tumor), gastric carcinoma or massive adenopathy as with lymphoma. Biopsy could be performed in view of the location of this mass.  The kidneys are unremarkable in the unenhanced state.  No renal calculi are noted.  No adenopathy is seen.   The abdominal aorta is normal in caliber.   The urinary bladder is unremarkable.  A reservoir for penile prosthesis is noted just anterior to the urinary bladder.  The prostate is slightly prominent.  No abnormality of the colon is seen.  The appendix is unremarkable.  There are degenerative changes diffusely throughout the facet joints of the  lower lumbar spine.  IMPRESSION:  1.  Large somewhat inhomogeneous rounded soft tissue mass in the left upper quadrant appears to be intimately associated with the greater curvature of the stomach and may represent a GIST tumor versus gastric carcinoma  carcinoma  carcinoma or adenopathy as with lymphoma.  Biopsy could be performed. 2.  Faintly calcified gallstones within a somewhat thick-walled gallbladder.  Rule out acute cholecystitis. 3.  Nodular adrenal glands.  Possibly adrenal adenomas but cannot exclude metastatic involvement.   Original Report Authenticated By: Dwyane Dee, [name]    Dg Chest 2 View  04/05/2012  *RADIOLOGY REPORT*  Clinical Data: Altered mental status.  CHEST - 2 VIEW  Comparison: 03/03/2012.  Findings: The cardiac silhouette, mediastinal and hilar contours are stable.  Stable surgical changes from bypass surgery.  No acute pulmonary findings.  No pleural effusion or pneumothorax.  The bony thorax is intact.  IMPRESSION: No acute cardiopulmonary findings.   Original Report Authenticated By: Rudie Meyer, [name]    Ct Head Wo Contrast  04/05/2012  *RADIOLOGY REPORT*  Clinical Data: Weakness and altered mental status.  CT HEAD WITHOUT CONTRAST  Technique:  Contiguous axial images were obtained from the base of the skull through the vertex without contrast.  Comparison: 08/22/2010.  Findings: Stable age related cerebral atrophy, ventriculomegaly and periventricular white matter disease.  No extra-axial fluid collections.  No CT findings for hemispheric infarction and/or intracranial hemorrhage.  No mass lesions.  The brainstem and cerebellum are normal and stable. There is a remote right  cerebellar hemispheric infarction.  The bony structures are intact.  No skull fracture.  Mild mucoperiosteal thickening in the left half of the sphenoid sinus and scattered ethmoid air cells.  The mastoid air cells are clear.  IMPRESSION:  1. Stable age related cerebral atrophy, ventriculomegaly and periventricular white matter disease. 2.  Remote right-sided cerebellar infarction. 3.  No acute intracranial findings or mass lesion.   Original Report Authenticated By: Rudie Meyer, [name]    US Renal  04/05/2012  *RADIOLOGY REPORT*  Clinical Data: Acute renal failure  RENAL/URINARY TRACT ULTRASOUND COMPLETE  Comparison:  03/10/2006 CT  Findings:  Right Kidney:  Measures 10.8 cm.  No hydronephrosis.  1.5 x 1.3 cm interpolar hypoechoic lesion is incompletely characterized.  Left Kidney:  Measures 10.5 cm.  No hydronephrosis or focal abnormality.  Bladder:  Not visualized.  The patient has a Foley catheter in place.  Incidental note is made of a mixed cystic and solid mass in the left upper quadrant.  IMPRESSION: No hydronephrosis.  Incompletely characterized 1.5 cm hypoechoic right renal lesion, favored to reflect a mildly complex cyst.  Incompletely characterized 15 cm left upper quadrant mass.  Contrast enhanced CT or MRI recommended to further evaluate the above findings.   Original Report Authenticated By: Jearld Lesch, [name]    US Biopsy  04/11/2012  *RADIOLOGY REPORT*  ULTRASOUND-GUIDED CORE BIOPSY  Date: 04/11/2012  Clinical History: 72 year old male with areas of recently diagnosed left upper quadrant mass appears pedunculated and exophytic from the greater curvature of the stomach.  He presents for ultrasound- guided biopsy to facilitate tissue diagnosis.  Procedures Performed: 1. Ultrasound-guided core biopsy  Interventional Radiologist:  [name], MD  Sedation: Moderate (conscious) sedation was used.  One mg Versed, 50 mcg Fentanyl were administered intravenously.  The patient's vital signs  were monitored continuously by radiology nursing throughout the procedure.  Sedation Time: 10 minutes  Fluoroscopy time: None  Contrast volume: None  PROCEDURE/FINDINGS:  Informed consent was obtained from the patient following explanation of the procedure, risks, benefits and alternatives. The patient understands, agrees and consents for the procedure. All questions were addressed. A time out was performed.  Maximal barrier sterile technique utilized including caps, mask, sterile gowns, sterile gloves, large sterile drape, hand hygiene, and betadine skin prep.  The left upper quadrant was interrogated with ultrasound.  There is a large complex cystic and solid mass in the left upper quadrant which is inseparable from the stomach.  The internal cystic area likely represent areas of internal necrosis. A suitable skin entry site was selected marked.  Local anesthesia was obtained by infiltration of 1% lidocaine.  Under direct sonographic guidance, a 17 gauge trocar needle was advanced through the abdominal wall and into the mass.  Several 18-gauge core biopsies were then coaxially obtained from the solid portion of the mass.  Biopsies were placed in formalin.  As the 17 gauge outer cannula was removed, the biopsy tract was embolized with a Gelfoam slurry.  Post biopsy imaging demonstrated no active hemorrhage or perilesional hematoma.  The patient tolerated the procedure well.  No immediate complication.  IMPRESSION:  Technically successful ultrasound guided core biopsy of left upper quadrant mass lesion which is likely arising from the gastric wall.  Signed,  [name], MD Vascular & Interventional Radiologist [facility]   Original Report Authenticated By: [name], [name]     Microbiology: Recent Results (from the past 240 hour(s))  SURGICAL PCR SCREEN     Status: None   Collection Time    04/15/12 10:28 AM      Result Value Range Status   MRSA, PCR NEGATIVE  NEGATIVE Final    Staphylococcus aureus NEGATIVE  NEGATIVE Final   Comment:            The Xpert SA Assay (FDA     approved for NASAL specimens     in patients over 27 years of age),     is one component of     a comprehensive surveillance     program.  Test performance has     been validated by [employer] for patients greater     than or equal to 55 year old.     It is not intended     to diagnose infection nor to     guide or monitor treatment.     Labs: Basic Metabolic Panel:  Recent Labs Lab 04/17/12 0658 04/20/12 0520 04/21/12 0510 04/23/12 0515  NA 146* 143 145 143  K 4.5 3.1* 3.4* 3.0*  CL 113* 109 111 107  CO2 26 25 27 29   GLUCOSE 128* 111* 100* 101*  BUN 9 5* 4* 4*  CREATININE 1.04 0.83 0.90 0.93  CALCIUM 7.9* 8.0* 8.3* 8.2*  MG  --   --  1.4* 1.7   Liver Function Tests: No results found for this basename: AST, ALT, ALKPHOS, BILITOT, PROT, ALBUMIN,  in the last 168 hours No results found for this basename: LIPASE, AMYLASE,  in the last 168 hours No results found for this basename: AMMONIA,  in the last 168 hours CBC:  Recent Labs Lab 04/17/12 0658 04/18/12 1002 04/20/12 0520 04/21/12 0510 04/23/12 0515  WBC 14.2* 14.8* 10.2 7.3 10.6*  HGB 8.2* 8.2* 7.6* 8.0* 8.4*  HCT 25.1* 25.5* 22.8* 23.6* 26.1*  MCV 90.9 91.7 86.7 86.4 89.4  PLT 257 247 248 260 364   Cardiac Enzymes: No results found for  this basename: CKTOTAL, CKMB, CKMBINDEX, TROPONINI,  in the last 168 hours BNP: BNP (last 3 results) No results found for this basename: PROBNP,  in the last 8760 hours CBG:  Recent Labs Lab 04/22/12 1652 04/22/12 2032 04/23/12 0028 04/23/12 0409 04/23/12 0816  GLUCAP 219* 140* 136* 128* 108*       Signed:  REDDY,SRIKAR A  [facility] 04/23/2012, 11:52 AM

## 2012-04-23 NOTE — Progress Notes (Signed)
RUA PICC removed per order. No signs of infection present. No bleeding after removal.  Vaseline guaze and dry 4x4 applied tosite.  Pt verbalizes understanding of site care, drsg care and signs of infection.  No ADN.

## 2012-04-23 NOTE — Progress Notes (Signed)
Patient ID: [name]  male  BJY:[id number]    DOB: Mar 05, 1940    DOA: 04/05/2012  PCP: [name], MD  Assessment/Plan: Subacute upper GI bleeding Negative EGD on 2/19. Resolved. [facility] evaluated the patient, EGD and C-scope unrevealing.   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 Lavera Guise d/w Dr Welton Flakes from oncology, per her rec's, will likely need Gleevec after surgery. Dr. Welton Flakes will arrange for the appropriate followup for the patient after discharge.  Diabetic diet.  Acute blood loss anemia S/p 8 units of PRBCs during hospitalization. Hemoglobin stable prior to discharge.  Atrial fibrillation Rate controlled, in sinus rhythm. Antiplatelets held second GI bleeding, resumption to be deferred to his PCP at the next clinic visit.  Acute on CKD ARF resolved.   Spingomonas Paucimobilis UTI Recent urinary tract manipulation/penile prosthesis implantation: completed 7 days of rocephin.  Leukocytosis ? stress margination. Resolved.  History of CVA CT head without acute findings. No focal deficits at this time. Antiplatelets held secondary to GI bleed.  History of depression Continue Seroquel.  Uncontrolled type II DM Fairly controlled.  Altered mental status Possibly secondary to symptomatic anemia and UTI, resolved.  Recent penile implant and circumcision No acute findings. Discussed by Dr Waymon Amato with Dr. Brunilda Payor on 2/20, sutures are self absorbable and okay to use condom catheter. Outpatient followup.  Mild hypernatremia and hyperchloremia Improved.  Lactic acidosis Likely secondary to hypoperfusion from GI bleeding and anemia. Resolved.  Hypokalemia/hypomagnesemia Replace magnesium and potassium as needed. Give one weeks worth of supplemental potassium.  Hypertension Stable, continue amlodipine.    Severe protein calorie malnutrition Advance oral diet as tolerated.   DVT Prophylaxis:  SCD's  Code Status: DNR  Disposition: home with health today  Subjective: Had good bowel movements. Tolerating oral diet.  Objective: Weight change:   Intake/Output Summary (Last 24 hours) at 04/23/12 1142 Last data filed at 04/23/12 0624  Gross per 24 hour  Intake   1060 ml  Output   1100 ml  Net    -40 ml   Blood pressure 166/59, pulse 80, temperature 99.4 F (37.4 C), temperature source Oral, resp. rate 18, height 5\' 6"  (1.676 m), weight 85.73 kg (189 lb), SpO2 100.00%. Patient Vitals for the past 24 hrs:  BP Temp Temp src Pulse Resp SpO2  04/23/12 0535 166/59 mmHg 99.4 F (37.4 C) Oral 80 18 100 %  04/22/12 2200 156/76 mmHg 99.4 F (37.4 C) Oral 87 18 98 %  04/22/12 1418 173/77 mmHg 99.2 F (37.3 C) Oral 80 20 100 %    Physical Exam: General: Awake, Oriented, No acute distress. HEENT: EOMI. NG discontinued. Neck: Supple CV: S1 and S2 Lungs: Clear to ascultation bilaterally Abdomen: Soft, Nontender, Nondistended, +bowel sounds. Bandages in place. Ext: Good pulses. Trace edema.  Lab Results: Basic Metabolic Panel:  Recent Labs Lab 04/21/12 0510 04/23/12 0515  NA 145 143  K 3.4* 3.0*  CL 111 107  CO2 27 29  GLUCOSE 100* 101*  BUN 4* 4*  CREATININE 0.90 0.93  CALCIUM 8.3* 8.2*  MG 1.4* 1.7   Liver Function Tests: No results found for this basename: AST, ALT, ALKPHOS, BILITOT, PROT, ALBUMIN,  in the last 168 hours No results found for this basename: LIPASE, AMYLASE,  in the last 168 hours No results found for this basename: AMMONIA,  in the last 168 hours CBC:  Recent Labs Lab 04/21/12 0510 04/23/12 0515  WBC 7.3 10.6*  HGB 8.0* 8.4*  HCT 23.6* 26.1*  MCV 86.4 89.4  PLT 260 364   Cardiac Enzymes: No results found for this basename: CKTOTAL, CKMB, CKMBINDEX, TROPONINI,  in the last 168 hours BNP: No components found with this basename: POCBNP,  CBG:  Recent Labs Lab 04/22/12 1652 04/22/12 2032 04/23/12 0028 04/23/12 0409 04/23/12 0816   GLUCAP 219* 140* 136* 128* 108*     Micro Results: Recent Results (from the past 240 hour(s))  SURGICAL PCR SCREEN     Status: None   Collection Time    04/15/12 10:28 AM      Result Value Range Status   MRSA, PCR NEGATIVE  NEGATIVE Final   Staphylococcus aureus NEGATIVE  NEGATIVE Final   Comment:            The Xpert SA Assay (FDA     approved for NASAL specimens     in patients over 73 years of age),     is one component of     a comprehensive surveillance     program.  Test performance has     been validated by [employer] for patients greater     than or equal to 42 year old.     It is not intended     to diagnose infection nor to     guide or monitor treatment.    Studies/Results: Ct Abdomen Pelvis Wo Contrast  04/06/2012  *RADIOLOGY REPORT*  Clinical Data: Left upper quadrant mass on ultrasound  CT ABDOMEN AND PELVIS WITHOUT CONTRAST  Technique:  Multidetector CT imaging of the abdomen and pelvis was performed following the standard protocol without intravenous contrast.  Comparison: Ultrasound of the kidneys of 04/05/2012  Findings: The lung bases are clear.  There is moderate cardiomegaly present.  No pericardial effusion is seen.  The liver is unremarkable in the unenhanced state.  Faintly calcified gallstones are noted within the somewhat distended gallbladder and the gallbladder wall is slightly thickened as well and clinical correlation is recommended to exclude cholecystitis.  The pancreas appears somewhat atrophic and the pancreatic duct is not dilated. The adrenal glands are both slightly nodular most consistent with adrenal adenomas.  Metastatic involvement would be difficult to exclude.  The spleen is normal in size.  There is a large rounded soft tissue mass within the left upper quadrant as noted by ultrasound which appears to be intimately associated with the greater curvature of the stomach.  This mass measures approximately 11.1 x 12.7 x 13.2 cm and is  somewhat inhomogeneous in attenuation on this unenhanced study.  The primary considerations are that of a gastrointestinal stromal tumor (G I S T tumor), gastric carcinoma or massive adenopathy as with lymphoma. Biopsy could be performed in view of the location of this mass.  The kidneys are unremarkable in the unenhanced state.  No renal calculi are noted.  No adenopathy is seen.   The abdominal aorta is normal in caliber.  The urinary bladder is unremarkable.  A reservoir for penile prosthesis is noted just anterior to the urinary bladder.  The prostate is slightly prominent.  No abnormality of the colon is seen.  The appendix is unremarkable.  There are degenerative changes diffusely throughout the facet joints of the lower lumbar spine.  IMPRESSION:  1.  Large somewhat inhomogeneous rounded soft tissue mass in the left upper quadrant appears to be intimately associated with the greater curvature of the stomach and may represent a GIST tumor  versus gastric carcinoma  carcinoma  carcinoma or adenopathy as with lymphoma.  Biopsy could be performed. 2.  Faintly calcified gallstones within a somewhat thick-walled gallbladder.  Rule out acute cholecystitis. 3.  Nodular adrenal glands.  Possibly adrenal adenomas but cannot exclude metastatic involvement.   Original Report Authenticated By: Dwyane Dee, [name]    Dg Chest 2 View  04/05/2012  *RADIOLOGY REPORT*  Clinical Data: Altered mental status.  CHEST - 2 VIEW  Comparison: 03/03/2012.  Findings: The cardiac silhouette, mediastinal and hilar contours are stable.  Stable surgical changes from bypass surgery.  No acute pulmonary findings.  No pleural effusion or pneumothorax.  The bony thorax is intact.  IMPRESSION: No acute cardiopulmonary findings.   Original Report Authenticated By: Rudie Meyer, [name]    Ct Head Wo Contrast  04/05/2012  *RADIOLOGY REPORT*  Clinical Data: Weakness and altered mental status.  CT HEAD WITHOUT CONTRAST  Technique:  Contiguous axial  images were obtained from the base of the skull through the vertex without contrast.  Comparison: 08/22/2010.  Findings: Stable age related cerebral atrophy, ventriculomegaly and periventricular white matter disease.  No extra-axial fluid collections.  No CT findings for hemispheric infarction and/or intracranial hemorrhage.  No mass lesions.  The brainstem and cerebellum are normal and stable. There is a remote right cerebellar hemispheric infarction.  The bony structures are intact.  No skull fracture.  Mild mucoperiosteal thickening in the left half of the sphenoid sinus and scattered ethmoid air cells.  The mastoid air cells are clear.  IMPRESSION:  1. Stable age related cerebral atrophy, ventriculomegaly and periventricular white matter disease. 2.  Remote right-sided cerebellar infarction. 3.  No acute intracranial findings or mass lesion.   Original Report Authenticated By: Rudie Meyer, [name]    US Renal  04/05/2012  *RADIOLOGY REPORT*  Clinical Data: Acute renal failure  RENAL/URINARY TRACT ULTRASOUND COMPLETE  Comparison:  03/10/2006 CT  Findings:  Right Kidney:  Measures 10.8 cm.  No hydronephrosis.  1.5 x 1.3 cm interpolar hypoechoic lesion is incompletely characterized.  Left Kidney:  Measures 10.5 cm.  No hydronephrosis or focal abnormality.  Bladder:  Not visualized.  The patient has a Foley catheter in place.  Incidental note is made of a mixed cystic and solid mass in the left upper quadrant.  IMPRESSION: No hydronephrosis.  Incompletely characterized 1.5 cm hypoechoic right renal lesion, favored to reflect a mildly complex cyst.  Incompletely characterized 15 cm left upper quadrant mass.  Contrast enhanced CT or MRI recommended to further evaluate the above findings.   Original Report Authenticated By: Jearld Lesch, [name]    US Biopsy  04/11/2012  *RADIOLOGY REPORT*  ULTRASOUND-GUIDED CORE BIOPSY  Date: 04/11/2012  Clinical History: 72 year old male with areas of recently diagnosed left  upper quadrant mass appears pedunculated and exophytic from the greater curvature of the stomach.  He presents for ultrasound- guided biopsy to facilitate tissue diagnosis.  Procedures Performed: 1. Ultrasound-guided core biopsy  Interventional Radiologist:  [name], MD  Sedation: Moderate (conscious) sedation was used.  One mg Versed, 50 mcg Fentanyl were administered intravenously.  The patient's vital signs were monitored continuously by radiology nursing throughout the procedure.  Sedation Time: 10 minutes  Fluoroscopy time: None  Contrast volume: None  PROCEDURE/FINDINGS:   Informed consent was obtained from the patient following explanation of the procedure, risks, benefits and alternatives. The patient understands, agrees and consents for the procedure. All questions were addressed. A time out was performed.  Maximal barrier  sterile technique utilized including caps, mask, sterile gowns, sterile gloves, large sterile drape, hand hygiene, and betadine skin prep.  The left upper quadrant was interrogated with ultrasound.  There is a large complex cystic and solid mass in the left upper quadrant which is inseparable from the stomach.  The internal cystic area likely represent areas of internal necrosis. A suitable skin entry site was selected marked.  Local anesthesia was obtained by infiltration of 1% lidocaine.  Under direct sonographic guidance, a 17 gauge trocar needle was advanced through the abdominal wall and into the mass.  Several 18-gauge core biopsies were then coaxially obtained from the solid portion of the mass.  Biopsies were placed in formalin.  As the 17 gauge outer cannula was removed, the biopsy tract was embolized with a Gelfoam slurry.  Post biopsy imaging demonstrated no active hemorrhage or perilesional hematoma.  The patient tolerated the procedure well.  No immediate complication.  IMPRESSION:  Technically successful ultrasound guided core biopsy of left upper quadrant mass  lesion which is likely arising from the gastric wall.  Signed,  [name], MD Vascular & Interventional Radiologist [facility]   Original Report Authenticated By: [name], [name]     Medications: Scheduled Meds: . amLODipine  10 mg Oral Daily  . enoxaparin  40 mg Subcutaneous Q24H  . feeding supplement  1 Container Oral TID BM  . fluticasone  1 spray Each Nare Daily  . insulin aspart  0-9 Units Subcutaneous Q4H  .  morphine injection  1-2 mg Intravenous Once  . pantoprazole  40 mg Oral BID  . potassium chloride  40 mEq Oral Once  . QUEtiapine  200 mg Oral Q2000  . tamsulosin  0.4 mg Oral Daily      LOS: 18 days   REDDY,SRIKAR A [name]  04/23/2012, 11:42 AM Pager: [phone]  If 7PM-7AM, please contact night-coverage [email or web address] Password [email or web address]

## 2012-04-23 NOTE — Progress Notes (Signed)
Patient interviewed and examined, agree with PA note above.  He appears ready for  Mariella Saa MD, FACS  04/23/2012 12:56 PM

## 2012-04-25 ENCOUNTER — Telehealth: Payer: Self-pay | Admitting: *Deleted

## 2012-04-25 ENCOUNTER — Telehealth (INDEPENDENT_AMBULATORY_CARE_PROVIDER_SITE_OTHER): Payer: Self-pay

## 2012-04-25 NOTE — Telephone Encounter (Signed)
Spoke with patient by phone and confirmed appointment with Dr. Truett Perna for 04/29/12.  Contact names and phone numbers were provided.

## 2012-04-25 NOTE — Telephone Encounter (Signed)
The family friend called to schedule his follow up in 5 days.  Call [phone] or [phone].  Dr Lindie Spruce did the surgery.

## 2012-04-28 ENCOUNTER — Ambulatory Visit (INDEPENDENT_AMBULATORY_CARE_PROVIDER_SITE_OTHER): Payer: Medicare Other

## 2012-04-28 DIAGNOSIS — Z9889 Other specified postprocedural states: Secondary | ICD-10-CM

## 2012-04-28 DIAGNOSIS — Z903 Acquired absence of stomach [part of]: Secondary | ICD-10-CM

## 2012-04-28 NOTE — Progress Notes (Signed)
Pt came in for nurse only to have staples removed today. The staples were removed and steri strips applied. I advised pt to look out for signs of redness,drainage with odor,and fever after staples were removed. The pt was c/o not having an appetite and eating food. I advised pt that he needed to get some boost or ensure to help with his nutrition if not able to eat food yet. The pt will go and get these drinks today. The pt has a f/u appt with Dr Lindie Spruce on 05/03/12.

## 2012-04-29 ENCOUNTER — Encounter: Payer: Self-pay | Admitting: Oncology

## 2012-04-29 ENCOUNTER — Ambulatory Visit: Payer: Medicare Other

## 2012-04-29 ENCOUNTER — Inpatient Hospital Stay (HOSPITAL_COMMUNITY)
Admission: EM | Admit: 2012-04-29 | Discharge: 2012-05-06 | Disposition: A | Discharge status: Home Health | Payer: Medicare Other | Source: Home / Self Care | Attending: Internal Medicine | Admitting: Internal Medicine

## 2012-04-29 ENCOUNTER — Encounter (HOSPITAL_COMMUNITY): Payer: Self-pay | Admitting: Emergency Medicine

## 2012-04-29 ENCOUNTER — Other Ambulatory Visit: Payer: Self-pay

## 2012-04-29 ENCOUNTER — Emergency Department (HOSPITAL_COMMUNITY): Payer: Medicare Other

## 2012-04-29 ENCOUNTER — Ambulatory Visit (HOSPITAL_BASED_OUTPATIENT_CLINIC_OR_DEPARTMENT_OTHER): Payer: Medicare Other | Admitting: Oncology

## 2012-04-29 VITALS — BP 93/54 | HR 89 | Temp 96.8°F | Resp 20 | Ht 66.0 in | Wt 160.3 lb

## 2012-04-29 DIAGNOSIS — R630 Anorexia: Secondary | ICD-10-CM

## 2012-04-29 DIAGNOSIS — Z903 Acquired absence of stomach [part of]: Secondary | ICD-10-CM

## 2012-04-29 DIAGNOSIS — C494 Malignant neoplasm of connective and soft tissue of abdomen: Secondary | ICD-10-CM

## 2012-04-29 DIAGNOSIS — Z8673 Personal history of transient ischemic attack (TIA), and cerebral infarction without residual deficits: Secondary | ICD-10-CM

## 2012-04-29 DIAGNOSIS — E1169 Type 2 diabetes mellitus with other specified complication: Secondary | ICD-10-CM | POA: Diagnosis present

## 2012-04-29 DIAGNOSIS — R5381 Other malaise: Secondary | ICD-10-CM | POA: Diagnosis present

## 2012-04-29 DIAGNOSIS — I4949 Other premature depolarization: Secondary | ICD-10-CM | POA: Diagnosis not present

## 2012-04-29 DIAGNOSIS — Z6825 Body mass index (BMI) 25.0-25.9, adult: Secondary | ICD-10-CM

## 2012-04-29 DIAGNOSIS — D638 Anemia in other chronic diseases classified elsewhere: Secondary | ICD-10-CM | POA: Diagnosis present

## 2012-04-29 DIAGNOSIS — N179 Acute kidney failure, unspecified: Secondary | ICD-10-CM

## 2012-04-29 DIAGNOSIS — R627 Adult failure to thrive: Secondary | ICD-10-CM | POA: Diagnosis present

## 2012-04-29 DIAGNOSIS — I480 Paroxysmal atrial fibrillation: Secondary | ICD-10-CM | POA: Diagnosis present

## 2012-04-29 DIAGNOSIS — D481 Neoplasm of uncertain behavior of connective and other soft tissue: Secondary | ICD-10-CM | POA: Diagnosis present

## 2012-04-29 DIAGNOSIS — C49A0 Gastrointestinal stromal tumor, unspecified site: Secondary | ICD-10-CM

## 2012-04-29 DIAGNOSIS — I4891 Unspecified atrial fibrillation: Secondary | ICD-10-CM

## 2012-04-29 DIAGNOSIS — D649 Anemia, unspecified: Secondary | ICD-10-CM

## 2012-04-29 DIAGNOSIS — D72829 Elevated white blood cell count, unspecified: Secondary | ICD-10-CM | POA: Diagnosis present

## 2012-04-29 DIAGNOSIS — I129 Hypertensive chronic kidney disease with stage 1 through stage 4 chronic kidney disease, or unspecified chronic kidney disease: Secondary | ICD-10-CM | POA: Diagnosis present

## 2012-04-29 DIAGNOSIS — E162 Hypoglycemia, unspecified: Secondary | ICD-10-CM

## 2012-04-29 DIAGNOSIS — E860 Dehydration: Secondary | ICD-10-CM

## 2012-04-29 DIAGNOSIS — E876 Hypokalemia: Secondary | ICD-10-CM | POA: Diagnosis present

## 2012-04-29 DIAGNOSIS — K312 Hourglass stricture and stenosis of stomach: Secondary | ICD-10-CM | POA: Diagnosis present

## 2012-04-29 DIAGNOSIS — N189 Chronic kidney disease, unspecified: Secondary | ICD-10-CM

## 2012-04-29 DIAGNOSIS — E872 Acidosis, unspecified: Secondary | ICD-10-CM | POA: Diagnosis present

## 2012-04-29 DIAGNOSIS — IMO0002 Reserved for concepts with insufficient information to code with codable children: Secondary | ICD-10-CM | POA: Diagnosis present

## 2012-04-29 DIAGNOSIS — E46 Unspecified protein-calorie malnutrition: Secondary | ICD-10-CM | POA: Diagnosis present

## 2012-04-29 DIAGNOSIS — Z79899 Other long term (current) drug therapy: Secondary | ICD-10-CM

## 2012-04-29 DIAGNOSIS — D4819 Other specified neoplasm of uncertain behavior of connective and other soft tissue: Secondary | ICD-10-CM | POA: Diagnosis present

## 2012-04-29 DIAGNOSIS — I2510 Atherosclerotic heart disease of native coronary artery without angina pectoris: Secondary | ICD-10-CM | POA: Diagnosis present

## 2012-04-29 DIAGNOSIS — F3289 Other specified depressive episodes: Secondary | ICD-10-CM | POA: Diagnosis present

## 2012-04-29 DIAGNOSIS — F32A Depression, unspecified: Secondary | ICD-10-CM | POA: Diagnosis present

## 2012-04-29 DIAGNOSIS — K219 Gastro-esophageal reflux disease without esophagitis: Secondary | ICD-10-CM | POA: Diagnosis present

## 2012-04-29 DIAGNOSIS — R4182 Altered mental status, unspecified: Secondary | ICD-10-CM

## 2012-04-29 DIAGNOSIS — R1310 Dysphagia, unspecified: Secondary | ICD-10-CM

## 2012-04-29 DIAGNOSIS — Z951 Presence of aortocoronary bypass graft: Secondary | ICD-10-CM

## 2012-04-29 DIAGNOSIS — F329 Major depressive disorder, single episode, unspecified: Secondary | ICD-10-CM | POA: Diagnosis present

## 2012-04-29 DIAGNOSIS — I10 Essential (primary) hypertension: Secondary | ICD-10-CM | POA: Diagnosis present

## 2012-04-29 DIAGNOSIS — E119 Type 2 diabetes mellitus without complications: Secondary | ICD-10-CM

## 2012-04-29 DIAGNOSIS — N289 Disorder of kidney and ureter, unspecified: Secondary | ICD-10-CM

## 2012-04-29 LAB — URINALYSIS, ROUTINE W REFLEX MICROSCOPIC
Hgb urine dipstick: NEGATIVE
Protein, ur: NEGATIVE mg/dL
Urobilinogen, UA: 0.2 mg/dL (ref 0.0–1.0)

## 2012-04-29 LAB — CBC WITH DIFFERENTIAL/PLATELET
Basophils Relative: 0 % (ref 0–1)
Eosinophils Relative: 2 % (ref 0–5)
HCT: 33.2 % — ABNORMAL LOW (ref 39.0–52.0)
Hemoglobin: 10.5 g/dL — ABNORMAL LOW (ref 13.0–17.0)
MCHC: 31.6 g/dL (ref 30.0–36.0)
MCV: 88.8 fL (ref 78.0–100.0)
Monocytes Absolute: 0.9 10*3/uL (ref 0.1–1.0)
Monocytes Relative: 7 % (ref 3–12)
Neutro Abs: 9.9 10*3/uL — ABNORMAL HIGH (ref 1.7–7.7)

## 2012-04-29 LAB — HEPATIC FUNCTION PANEL
ALT: 6 U/L (ref 0–53)
AST: 12 U/L (ref 0–37)
Bilirubin, Direct: 0.1 mg/dL (ref 0.0–0.3)
Indirect Bilirubin: 0.2 mg/dL — ABNORMAL LOW (ref 0.3–0.9)
Total Bilirubin: 0.3 mg/dL (ref 0.3–1.2)

## 2012-04-29 LAB — POCT I-STAT, CHEM 8
BUN: 20 mg/dL (ref 6–23)
Calcium, Ion: 1.02 mmol/L — ABNORMAL LOW (ref 1.13–1.30)
Creatinine, Ser: 1.7 mg/dL — ABNORMAL HIGH (ref 0.50–1.35)
TCO2: 27 mmol/L (ref 0–100)

## 2012-04-29 MED ORDER — SODIUM CHLORIDE 0.9 % IV BOLUS (SEPSIS): 1000.0000 mL | Freq: Once | INTRAVENOUS | Status: DC

## 2012-04-29 MED ORDER — SODIUM CHLORIDE 0.9 % IV SOLN
Freq: Once | INTRAVENOUS | Status: AC
  Administered 2012-04-29: 18:00:00 via INTRAVENOUS

## 2012-04-29 MED ORDER — NALOXONE HCL 1 MG/ML IJ SOLN
1.0000 mg | Freq: Once | INTRAMUSCULAR | Status: AC
  Administered 2012-04-29: 1 mg via INTRAVENOUS
  Filled 2012-04-29: qty 2

## 2012-04-29 NOTE — ED Provider Notes (Signed)
History     CSN: [id number]  Arrival date & time 04/29/12  1713   First MD Initiated Contact with Patient 04/29/12 1721      Chief Complaint  Patient presents with  . Weakness  . lethargic     (Consider location/radiation/quality/duration/timing/severity/associated sxs/prior treatment) HPI Comments: Patient arrives from the cancer center to be evaluated, recently had an admission to the hospital requiring 8 units of transfused blood secondary to significant anemia from a GI bleed. During that admission He had a negative EGD as well as a negative colonoscopy reported in the discharge summary.  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 He has had the staples out of his midline incision. The patient denies feeling bad today, he is unsure what is going on, he presented to the cancer center with diffuse weakness and lethargy according to the nursing reports but had a normal mental status was able answer questions appropriately. The patient admits to having OxyContin at home but states that he has not been using it has Tylenol has been enough for his pain. He denies any recurrent bleeding from his rectum, denies vomiting or nausea and has no abdominal pain or shortness of breath at this time. The patient states that he did wake up this morning and was able eat breakfast, he has not been walking around very much because he is feeling weak.ng and was able eat breakfast.  Patient is a 72 y.o. male presenting with weakness. The history is provided by the patient, the EMS personnel and medical records.  Weakness    Past Medical History  Diagnosis Date  . Coronary artery disease   . Hypertension   . Depression   . Diabetes mellitus without complication   . Stroke 72 years old  . GERD (gastroesophageal reflux disease)     hx of  . Arthritis     hx of  . Erectile dysfunction   . Atrial fibrillation      Past Surgical History  Procedure Laterality Date  . Coronary artery bypass graft    . Eye surgery      cataracts bilateral  . Carpal tunnel release      bilateral  . Tonsillectomy    . Penile prosthesis implant  03/15/2012    Procedure: PENILE PROTHESIS INFLATABLE;  Surgeon: Lindaann Slough, MD;  Location: [facility];  Service: Urology;  Laterality: N/A;  . Circumcision  03/15/2012    Procedure: CIRCUMCISION ADULT;  Surgeon: Lindaann Slough, MD;  Location: [facility];  Service: Urology;  Laterality: N/A;  . Esophagogastroduodenoscopy N/A 04/06/2012    Procedure: ESOPHAGOGASTRODUODENOSCOPY (EGD);  Surgeon: Graylin Shiver, MD;  Location: [facility] ENDOSCOPY;  Service: Endoscopy;  Laterality: N/A;  . Colonoscopy N/A 04/14/2012    Procedure: COLONOSCOPY;  Surgeon: Barrie Folk, MD;  Location: [facility] ENDOSCOPY;  Service: Endoscopy;  Laterality: N/A;  . Gastric resection N/A 04/15/2012    Procedure: Partial Gastrectomy;  Surgeon: Cherylynn Ridges, MD;  Location: [facility] OR;  Service: General;  Laterality: N/A;    No family history on file.  History  Substance Use Topics  . Smoking status: Never Smoker   . Smokeless tobacco: Never Used  . Alcohol Use: No      Review of Systems  Neurological: Positive for weakness.  All other systems reviewed and are negative.    Allergies  Review of patient's allergies indicates no known allergies.  Home  Medications   Current Outpatient Rx  Name  Route  Sig  Dispense  Refill  . amLODipine (NORVASC) 10 MG tablet   Oral   Take 1 tablet (10 mg total) by mouth daily.   30 tablet   0   . atorvastatin (LIPITOR) 40 MG tablet   Oral   Take 40 mg by mouth daily.         . Cholecalciferol (VITAMIN D-3) 5000 UNITS TABS   Oral   Take 5,000 Units by mouth daily.         . clopidogrel (PLAVIX) 75 MG tablet   Oral   Take 75 mg by mouth daily.         . feeding supplement (RESOURCE BREEZE) LIQD   Oral   Take 1 Container by mouth 3 (three) times daily between  meals.         Marland Kitchen glimepiride (AMARYL) 4 MG tablet   Oral   Take 4 mg by mouth daily before breakfast.         . metFORMIN (GLUCOPHAGE) 1000 MG tablet   Oral   Take 1,000 mg by mouth 2 (two) times daily with a meal.         . metoprolol tartrate (LOPRESSOR) 25 MG tablet   Oral   Take 12.5 mg by mouth 2 (two) times daily.         . nitroGLYCERIN (NITROSTAT) 0.4 MG SL tablet   Sublingual   Place 0.4 mg under the tongue every 5 (five) minutes as needed for chest pain.         Marland Kitchen oxyCODONE-acetaminophen (PERCOCET/ROXICET) 5-325 MG per tablet   Oral   Take 1-2 tablets by mouth every 6 (six) hours as needed.   20 tablet   0   . pantoprazole (PROTONIX) 40 MG tablet   Oral   Take 1 tablet (40 mg total) by mouth 2 (two) times daily.   60 tablet   0   . polyethylene glycol (MIRALAX / GLYCOLAX) packet   Oral   Take 17 g by mouth daily as needed (He needs 1-2 smooth BM per day.).   60 each   0   . potassium chloride SA (K-DUR,KLOR-CON) 20 MEQ tablet   Oral   Take 1 tablet (20 mEq total) by mouth daily.   7 tablet   0   . QUEtiapine (SEROQUEL) 200 MG tablet   Oral   Take 1 tablet (200 mg total) by mouth daily at 8 pm.         . sildenafil (VIAGRA) 100 MG tablet   Oral   Take 100 mg by mouth daily as needed for erectile dysfunction.         . Tamsulosin HCl (FLOMAX) 0.4 MG CAPS   Oral   Take 0.4 mg by mouth daily.           BP 137/50  Pulse 79  Temp(Src) 98.6 F (37 C) (Rectal)  Resp 15  SpO2 98%  Physical Exam  Nursing note and vitals reviewed. Constitutional: He appears well-developed and well-nourished.  Somnolent but arousable  HENT:  Head: Normocephalic and atraumatic.  Mouth/Throat: No oropharyngeal exudate.  Mucous membranes significantly dehydrated  Eyes: Conjunctivae and EOM are normal. Pupils are equal, round, and reactive to light. Right eye exhibits no discharge. Left eye exhibits no discharge. No scleral icterus.  Neck: Normal range  of motion. Neck supple. No JVD present. No thyromegaly present.  Cardiovascular: Normal rate, regular rhythm, normal heart  sounds and intact distal pulses.  Exam reveals no gallop and no friction rub.   No murmur heard. Pulmonary/Chest: Effort normal and breath sounds normal. No respiratory distress. He has no wheezes. He has no rales.  Abdominal: Soft. Bowel sounds are normal. He exhibits no distension and no mass. There is no tenderness.  Midline abdominal incision is clean dry and intact, no erythema drainage or tenderness or masses, no abdominal tenderness or guarding, no hepatosplenomegaly  Genitourinary:  Normal appearing penis scrotum and testicles, the penile shaft is consistent with having a penile implant, no erythema or tenderness  Musculoskeletal: Normal range of motion. He exhibits no edema and no tenderness.  Lymphadenopathy:    He has no cervical adenopathy.  Neurological: Coordination normal.  Somnolent but arousable to voice, is able to lift both of his legs off the bed without any difficulty, asking to lift both of his hands and some in the month of the year, his left upper extremity has slight weakness but is able to reposition it was a thickened. No difference in his grips, no cranial nerve deficits 3 through 12 as tested, pupils are 1 mm and nonreactive bilaterally  Skin: Skin is warm and dry. No rash noted. No erythema.  Psychiatric: He has a normal mood and affect. His behavior is normal.    ED Course  Procedures (including critical care time)  Labs Reviewed  CBC WITH DIFFERENTIAL - Abnormal; Notable for the following:    WBC 12.5 (*)    RBC 3.74 (*)    Hemoglobin 10.5 (*)    HCT 33.2 (*)    Platelets 582 (*)    Neutrophils Relative 80 (*)    Neutro Abs 9.9 (*)    Lymphocytes Relative 11 (*)    All other components within normal limits  HEPATIC FUNCTION PANEL - Abnormal; Notable for the following:    Albumin 2.8 (*)    Indirect Bilirubin 0.2 (*)    All other  components within normal limits  URINALYSIS, ROUTINE W REFLEX MICROSCOPIC - Abnormal; Notable for the following:    APPearance CLOUDY (*)    Bilirubin Urine SMALL (*)    Ketones, ur 15 (*)    All other components within normal limits  CG4 I-STAT (LACTIC ACID) - Abnormal; Notable for the following:    Lactic Acid, Venous 2.80 (*)    All other components within normal limits  POCT I-STAT, CHEM 8 - Abnormal; Notable for the following:    Creatinine, Ser 1.70 (*)    Calcium, Ion 1.02 (*)    All other components within normal limits  URINE RAPID DRUG SCREEN (HOSP PERFORMED)  POCT I-STAT TROPONIN I   Ct Head Wo Contrast  04/29/2012  *RADIOLOGY REPORT*  Clinical Data: 72 year old male with altered mental status.  CT HEAD WITHOUT CONTRAST  Technique:  Contiguous axial images were obtained from the base of the skull through the vertex without contrast.  Comparison: 04/05/2012 and 07/28/2010 head CTs  Findings: Mild generalized cerebral volume loss and chronic small vessel white matter ischemic changes again noted. A remote right cerebellar infarct is unchanged. No acute intracranial abnormalities are identified, including mass lesion or mass effect, hydrocephalus, extra-axial fluid collection, midline shift, hemorrhage, or acute infarction.  The visualized bony calvarium is unremarkable.  IMPRESSION: No evidence of acute intracranial abnormality.  Chronic small vessel white matter ischemic changes and remote right cerebellar infarct.   Original Report Authenticated By: Harmon Pier, [name]    Dg [facility]  04/29/2012  *RADIOLOGY REPORT*  Clinical Data: Hypertension and diabetes.  Complains of weakness, fatigue, and altered mental status.  PORTABLE CHEST - 1 VIEW  Comparison: 04/05/2012  Findings: Stable postoperative changes in the mediastinum. Borderline heart size with normal pulmonary vascularity.  This is likely normal for portable technique.  No focal consolidation or airspace disease.  No  blunting of costophrenic angles.  No pneumothorax.  Mediastinal contours appear intact.  Degenerative changes in the thoracic spine and shoulders.  No significant change since previous study.  IMPRESSION: No evidence of active pulmonary disease.   Original Report Authenticated By: Burman Nieves, [name]      1. Altered mental status   2. Dehydration   3. Renal insufficiency       MDM  The patient has normal vital signs, he is not hypotensive febrile or tachycardic. His postoperative abdomen is soft and nontender his lungs are clear. He is somnolent but arousable which could be from multiple different causes, possible recurrent anemia, electrolyte disturbance, severe dehydration with renal dysfunction and would also consider opiate use, Narcan ordered, IV fluids, check labs. EKG shows right bundle-branch block, consistent with prior EKG  ED ECG REPORT  I personally interpreted this EKG   Date: 04/29/2012   Rate: 82  Rhythm: normal sinus rhythm  QRS Axis: normal  Intervals: normal  ST/T Wave abnormalities: nonspecific ST/T changes  Conduction Disutrbances:right bundle branch block  Narrative Interpretation:   Old EKG Reviewed: unchanged compared with 04/05/2012  Labs unremarkale other than mild renl insuff and dehydration, CT neg, d/w hospitalist as pt has persisten tmild somnolence, will admit.     Vida Roller, MD 04/29/12 2156

## 2012-04-29 NOTE — Progress Notes (Signed)
Checked in new pt with no financial concerns. °

## 2012-04-29 NOTE — ED Notes (Signed)
Pt stated that he had urinated earlier.   Cannot find the sample.  Will try again in 30 minutes.

## 2012-04-29 NOTE — Progress Notes (Signed)
[facility] New Patient Consult   Referring MD: Jaylee Lantry 72 y.o.  08/12/1940    Reason for Referral: Gastrointestinal stromal tumor     HPI: He presented to the emergency room on 04/05/2012 with an altered mental status. He was noted to have a hemoglobin of 4.1 in the emergency room. He was admitted for further evaluation. He reported melena. He was on aspirin and Plavix prior to hospital admission. He underwent a negative upper endoscopy on 04/06/2012. A CT revealed a left upper quadrant mass. Interventional radiology was consult at for a biopsy of the mass. This was performed on 04/11/2012 and the pathology returned consistent with a gastrointestinal stromal tumor.  Dr. Cliffton Asters was consult at and he was taken to the operating room on 04/15/2012 and underwent a partial gastrectomy. A large tumor was noted over the greater curvature of stomach. The tumor was completely resected. During the dissection the tumor "ruptured "spilling tumor particles into the central portion of the wound. The pathology ([id number]) C. confirmed a gastrointestinal stromal tumor measuring 15 cm. 7 mitoses per 50 high-powered fields were noted. The margins could not be evaluated since the tumor was submitted in separate pieces. He was discharged from the hospital on 04/23/2012.  He presents today to discuss adjuvant treatment options. A friend brought him to the appointment today. The friend reports [name] has been lethargic since discharge from the hospital with anorexia. He presented to the office in a wheelchair. He is lethargic and unable to give much history. The patient and his friend reports he has not been taking pain medications.   Past Medical History  Diagnosis Date  . Coronary artery disease   . Hypertension   . Depression   . Diabetes mellitus without complication   . Stroke 72 years old  . GERD (gastroesophageal reflux disease)     hx of  . Arthritis    hx of  . Erectile dysfunction   . Atrial fibrillation     Past Surgical History  Procedure Laterality Date  . Coronary artery bypass graft    . Eye surgery      cataracts bilateral  . Carpal tunnel release      bilateral  . Tonsillectomy    . Penile prosthesis implant  03/15/2012    Procedure: PENILE PROTHESIS INFLATABLE;  Surgeon: Lindaann Slough, MD;  Location: [facility];  Service: Urology;  Laterality: N/A;  . Circumcision  03/15/2012    Procedure: CIRCUMCISION ADULT;  Surgeon: Lindaann Slough, MD;  Location: [facility];  Service: Urology;  Laterality: N/A;  . Esophagogastroduodenoscopy N/A 04/06/2012    Procedure: ESOPHAGOGASTRODUODENOSCOPY (EGD);  Surgeon: Graylin Shiver, MD;  Location: [facility] ENDOSCOPY;  Service: Endoscopy;  Laterality: N/A;  . Colonoscopy N/A 04/14/2012    Procedure: COLONOSCOPY;  Surgeon: Barrie Folk, MD;  Location: [facility] ENDOSCOPY;  Service: Endoscopy;  Laterality: N/A;  . Gastric resection N/A 04/15/2012    Procedure: Partial Gastrectomy;  Surgeon: Cherylynn Ridges, MD;  Location: [facility] OR;  Service: General;  Laterality: N/A;    Family history: No family history of cancer. He has one brother and one daughter.  Current outpatient prescriptions:amLODipine (NORVASC) 10 MG tablet, Take 1 tablet (10 mg total) by mouth daily., Disp: 30 tablet, Rfl: 0;  atorvastatin (LIPITOR) 40 MG tablet, Take 40 mg by mouth daily., Disp: , Rfl: ;  Cholecalciferol (VITAMIN D-3) 5000 UNITS TABS, Take 5,000 Units by mouth daily., Disp: , Rfl: ;  clopidogrel (  PLAVIX) 75 MG tablet, Take 75 mg by mouth daily., Disp: , Rfl:  feeding supplement (RESOURCE BREEZE) LIQD, Take 1 Container by mouth 3 (three) times daily between meals., Disp: , Rfl: ;  glimepiride (AMARYL) 4 MG tablet, Take 4 mg by mouth daily before breakfast., Disp: , Rfl: ;  metFORMIN (GLUCOPHAGE) 1000 MG tablet, Take 1,000 mg by mouth 2 (two) times daily with a meal., Disp: , Rfl: ;  metoprolol tartrate (LOPRESSOR) 25 MG tablet, Take 12.5 mg by mouth  2 (two) times daily., Disp: , Rfl:  nitroGLYCERIN (NITROSTAT) 0.4 MG SL tablet, Place 0.4 mg under the tongue every 5 (five) minutes as needed for chest pain., Disp: , Rfl: ;  oxyCODONE-acetaminophen (PERCOCET/ROXICET) 5-325 MG per tablet, Take 1-2 tablets by mouth every 6 (six) hours as needed., Disp: 20 tablet, Rfl: 0;  pantoprazole (PROTONIX) 40 MG tablet, Take 1 tablet (40 mg total) by mouth 2 (two) times daily., Disp: 60 tablet, Rfl: 0 polyethylene glycol (MIRALAX / GLYCOLAX) packet, Take 17 g by mouth daily as needed (He needs 1-2 smooth BM per day.)., Disp: 60 each, Rfl: 0;  potassium chloride SA (K-DUR,KLOR-CON) 20 MEQ tablet, Take 1 tablet (20 mEq total) by mouth daily., Disp: 7 tablet, Rfl: 0;  QUEtiapine (SEROQUEL) 200 MG tablet, Take 1 tablet (200 mg total) by mouth daily at 8 pm., Disp: , Rfl:  sildenafil (VIAGRA) 100 MG tablet, Take 100 mg by mouth daily as needed for erectile dysfunction., Disp: , Rfl: ;  Tamsulosin HCl (FLOMAX) 0.4 MG CAPS, Take 0.4 mg by mouth daily., Disp: , Rfl:   Allergies: No Known Allergies  Social History: He lives alone in [place]. He is retired Community education officer. He does not use tobacco or alcohol. No transfusion history prior to the recent hospital admission. No risk factor for HIV or hepatitis.  ROS:   Positives include: Anorexia and weight loss prior to the February 2014 hospital admission  A complete ROS was otherwise negative.  Physical Exam:  Blood pressure 93/54, pulse 89, temperature 96.8 F (36 C), temperature source Oral, resp. rate 20, height 5\' 6"  (1.676 m), weight 160 lb 4.8 oz (72.712 kg).  HEENT: Oropharynx without visible mass, neck without mass Lungs: Clear bilaterally Cardiac: Regular rate and rhythm Abdomen: Healing midline incision with Steri-Strips in place. Nontender, soft, no hepatosplenomegaly GU: Penile implant in place. Testes without mass  Vascular: No leg edema Lymph nodes: No cervical, supraclavicular, axillary, or  inguinal nodes Neurologic: Lethargic, arousable, speaks a few words, follows commands, unable to sit up on his own or ambulate, the pupils are small  Skin: Diminished skin turgor, no rash   LAB:  CBC  Lab Results  Component Value Date   WBC 12.5* 04/29/2012   HGB 13.3 04/29/2012   HCT 39.0 04/29/2012   MCV 88.8 04/29/2012   PLT 582* 04/29/2012     CMP      Component Value Date/Time   NA 140 04/29/2012 1742   K 3.5 04/29/2012 1742   CL 104 04/29/2012 1742   CO2 29 04/23/2012 0515   GLUCOSE 78 04/29/2012 1742   BUN 20 04/29/2012 1742   CREATININE 1.70* 04/29/2012 1742   CALCIUM 8.2* 04/23/2012 0515   PROT 5.7* 04/11/2012 0700   ALBUMIN 2.0* 04/11/2012 0700   AST 28 04/11/2012 0700   ALT 32 04/11/2012 0700   ALKPHOS 87 04/11/2012 0700   BILITOT 0.2* 04/11/2012 0700   GFRNONAA 83* 04/23/2012 0515   GFRAA >90 04/23/2012 0515  Radiology: CT of the abdomen and pelvis 04/06/2012-large soft tissue mass in the left upper quadrant that is associated with the greater curvature of the stomach. No adenopathy.    Assessment/Plan:   1. 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 Diabetes  3. History of coronary artery disease  4. Chronic renal failure  5. Admission with gastrointestinal bleeding and severe anemia 04/05/2012   Disposition:   [name] is referred to consider the indication for adjuvant therapy after undergoing resection of a gastric GIST. He is at high-risk of developing progressive disease based on the tumor size, mitotic rate, and tumor rupture at the time of surgery. I recommend adjuvant Gleevec therapy.  He appears acutely ill today. He is lethargic and appears dehydrated. He has not been eating or drinking adequate amounts according to his friend. He is unable to provide a history We will refer [name] to the emergency room for  further diagnostic evaluation. We will reschedule an oncology appointment for within the next several weeks to further discuss adjuvant Gleevec.  [name], [name] 04/29/2012, 6:21 PM

## 2012-04-29 NOTE — H&P (Signed)
PCP:   [name], MD   Chief Complaint:  dehydrated  HPI: 72 yo male with 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  Has had no appetite.  Lives alone.  HH first visit today to his house and PT.  No fevers.  No n/v/d.  No rashes.  Loosing weight.  Went to onc office for evaluation of further treatment options and was noted to be very weak and dehydrated so sent to ED for evaluation.  Review of Systems:  Positive and negative as per HPI otherwise all other systems are negative  Past Medical History: Past Medical History  Diagnosis Date  . Coronary artery disease   . Hypertension   . Depression   . Diabetes mellitus without complication   . Stroke 72 years old  . GERD (gastroesophageal reflux disease)     hx of  . Arthritis     hx of  . Erectile dysfunction   . Atrial fibrillation    Past Surgical History  Procedure Laterality Date  . Coronary artery bypass graft    . Eye surgery      cataracts bilateral  . Carpal tunnel release      bilateral  . Tonsillectomy    . Penile prosthesis implant  03/15/2012    Procedure: PENILE PROTHESIS INFLATABLE;  Surgeon: Lindaann Slough, MD;  Location: [facility] ORS;  Service: Urology;  Laterality: N/A;  . Circumcision  03/15/2012    Procedure: CIRCUMCISION ADULT;  Surgeon: Lindaann Slough, MD;  Location: [facility]S;  Service: Urology;  Laterality: N/A;  . Esophagogastroduodenoscopy N/A 04/06/2012    Procedure: ESOPHAGOGASTRODUODENOSCOPY (EGD);  Surgeon: Graylin Shiver, MD;  Location: [facility] ENDOSCOPY;  Service: Endoscopy;  Laterality: N/A;  . Colonoscopy N/A 04/14/2012    Procedure: COLONOSCOPY;  Surgeon: Barrie Folk, MD;  Location: [facility] ENDOSCOPY;  Service: Endoscopy;  Laterality: N/A;  . Gastric resection N/A 04/15/2012    Procedure: Partial Gastrectomy;  Surgeon: Cherylynn Ridges, MD;  Location: [facility] OR;  Service: General;  Laterality: N/A;     Medications: Prior to Admission medications   Medication Sig Start Date End Date Taking? Authorizing Provider  amLODipine (NORVASC) 10 MG tablet Take 1 tablet (10 mg total) by mouth daily. 04/23/12   Srikar Cherlynn Kaiser, MD  atorvastatin (LIPITOR) 40 MG tablet Take 40 mg by mouth daily.    Historical Provider, MD  Cholecalciferol (VITAMIN D-3) 5000 UNITS TABS Take 5,000 Units by mouth daily.    Historical Provider, MD  clopidogrel (PLAVIX) 75 MG tablet Take 75 mg by mouth daily.    Historical Provider, MD  feeding supplement (RESOURCE BREEZE) LIQD Take 1 Container by mouth 3 (three) times daily between meals. 04/23/12   Srikar Cherlynn Kaiser, MD  glimepiride (AMARYL) 4 MG tablet Take 4 mg by mouth daily before breakfast.    Historical Provider, MD  metFORMIN (GLUCOPHAGE) 1000 MG tablet Take 1,000 mg by mouth 2 (two) times daily with a meal.    Historical Provider, MD  metoprolol tartrate (LOPRESSOR) 25 MG tablet Take 12.5 mg by mouth 2 (two) times daily.    Historical Provider, MD  nitroGLYCERIN (NITROSTAT) 0.4 MG SL tablet Place 0.4 mg under the tongue every 5 (five) minutes as needed for chest pain.    Historical Provider, MD  oxyCODONE-acetaminophen (PERCOCET/ROXICET) 5-325 MG per tablet Take 1-2 tablets by mouth every 6 (six) hours as needed. 04/23/12  Cristal Ford, MD  pantoprazole (PROTONIX) 40 MG tablet Take 1 tablet (40 mg total) by mouth 2 (two) times daily. 04/23/12   Srikar Cherlynn Kaiser, MD  polyethylene glycol (MIRALAX / GLYCOLAX) packet Take 17 g by mouth daily as needed (He needs 1-2 smooth BM per day.). 04/23/12   Cristal Ford, MD  potassium chloride SA (K-DUR,KLOR-CON) 20 MEQ tablet Take 1 tablet (20 mEq total) by mouth daily. 04/23/12   Cristal Ford, MD  QUEtiapine (SEROQUEL) 200 MG tablet Take 1 tablet (200 mg total) by mouth daily at 8 pm. 04/23/12   Cristal Ford, MD  sildenafil (VIAGRA) 100 MG tablet Take 100 mg by mouth daily as needed for erectile dysfunction.    Historical Provider, MD   Tamsulosin HCl (FLOMAX) 0.4 MG CAPS Take 0.4 mg by mouth daily.    Historical Provider, MD    Allergies:  No Known Allergies  Social History:  reports that he has never smoked. He has never used smokeless tobacco. He reports that he does not drink alcohol or use illicit drugs.  Family History: neg  Physical Exam: Filed Vitals:   04/29/12 1714 04/29/12 2000 04/29/12 2030 04/29/12 2100  BP: 109/58 127/50 133/56 137/50  Pulse: 79 81  79  Temp: 98.6 F (37 C)     TempSrc: Rectal     Resp: 22 22 15 15   SpO2: 96% 96%  98%   General appearance: alert, cooperative and no distress  Mm dry  Head: Normocephalic, without obvious abnormality, atraumatic Eyes: negative Nose: Nares normal. Septum midline. Mucosa normal. No drainage or sinus tenderness. Neck: no JVD and supple, symmetrical, trachea midline Lungs: clear to auscultation bilaterally Heart: regular rate and rhythm, S1, S2 normal, no murmur, click, rub or gallop Abdomen: soft, non-tender; bowel sounds normal; no masses,  no organomegaly Extremities: extremities normal, atraumatic, no cyanosis or edema Pulses: 2+ and symmetric Skin: Skin color, texture, turgor normal. No rashes or lesions Neurologic: Grossly normal    Labs on Admission:   Recent Labs  04/29/12 1742  NA 140  K 3.5  CL 104  GLUCOSE 78  BUN 20  CREATININE 1.70*    Recent Labs  04/29/12 1715  AST 12  ALT 6  ALKPHOS 109  BILITOT 0.3  PROT 7.0  ALBUMIN 2.8*    Recent Labs  04/29/12 1715 04/29/12 1742  WBC 12.5*  --   NEUTROABS 9.9*  --   HGB 10.5* 13.3  HCT 33.2* 39.0  MCV 88.8  --   PLT 582*  --    Radiological Exams on Admission:  Ct Head Wo Contrast  04/29/2012  *RADIOLOGY REPORT*  Clinical Data: 72 year old male with altered mental status.  CT HEAD WITHOUT CONTRAST  Technique:  Contiguous axial images were obtained from the base of the skull through the vertex without contrast.  Comparison: 04/05/2012 and 07/28/2010 head CTs   Findings: Mild generalized cerebral volume loss and chronic small vessel white matter ischemic changes again noted. A remote right cerebellar infarct is unchanged. No acute intracranial abnormalities are identified, including mass lesion or mass effect, hydrocephalus, extra-axial fluid collection, midline shift, hemorrhage, or acute infarction.  The visualized bony calvarium is unremarkable.  IMPRESSION: No evidence of acute intracranial abnormality.  Chronic small vessel white matter ischemic changes and remote right cerebellar infarct.   Original Report Authenticated By: Harmon Pier, [name]    Dg Chest Port 1 View  04/29/2012  *RADIOLOGY REPORT*  Clinical Data: Hypertension and diabetes.  Complains  of weakness, fatigue, and altered mental status.  PORTABLE CHEST - 1 VIEW  Comparison: 04/05/2012  Findings: Stable postoperative changes in the mediastinum. Borderline heart size with normal pulmonary vascularity.  This is likely normal for portable technique.  No focal consolidation or airspace disease.  No blunting of costophrenic angles.  No pneumothorax.  Mediastinal contours appear intact.  Degenerative changes in the thoracic spine and shoulders.  No significant change since previous study.  IMPRESSION: No evidence of active pulmonary disease.   Original Report Authenticated By: Burman Nieves, [name]     Assessment/Plan 72 yo male with dehydration, renal failure  Principal Problem:   Acute on chronic renal failure Active Problems:   Lactic acidosis   HTN (hypertension)   FTT (failure to thrive) in adult   Dehydration   Stromal cell tumor  Ivf.  No source of infection.  Wbc normal.  Afebrile.  Monitor Cr.  Monitor diet.  Tele bed.    DAVID,RACHAL A 04/29/2012, 10:05 PM

## 2012-04-29 NOTE — ED Notes (Signed)
Pt came from Cancer center where he was there to be evaluated for cancer but pt doesn't know what type of cancer he has.  Pt recently had abdominal surgery and was released from hospital per pt. PT became real weak and lethargic at cancer center and was sent here for further evaluation.

## 2012-04-29 NOTE — ED Notes (Signed)
Attempted to call report

## 2012-04-30 DIAGNOSIS — D72829 Elevated white blood cell count, unspecified: Secondary | ICD-10-CM | POA: Diagnosis present

## 2012-04-30 DIAGNOSIS — D649 Anemia, unspecified: Secondary | ICD-10-CM

## 2012-04-30 DIAGNOSIS — I4891 Unspecified atrial fibrillation: Secondary | ICD-10-CM

## 2012-04-30 DIAGNOSIS — E162 Hypoglycemia, unspecified: Secondary | ICD-10-CM | POA: Diagnosis present

## 2012-04-30 DIAGNOSIS — N189 Chronic kidney disease, unspecified: Secondary | ICD-10-CM

## 2012-04-30 DIAGNOSIS — E860 Dehydration: Secondary | ICD-10-CM

## 2012-04-30 DIAGNOSIS — N179 Acute kidney failure, unspecified: Secondary | ICD-10-CM

## 2012-04-30 DIAGNOSIS — E876 Hypokalemia: Secondary | ICD-10-CM | POA: Diagnosis not present

## 2012-04-30 LAB — COMPREHENSIVE METABOLIC PANEL
ALT: <5 U/L (ref 0–53)
AST: 11 U/L (ref 0–37)
Albumin: 2.5 g/dL — ABNORMAL LOW (ref 3.5–5.2)
CO2: 24 mEq/L (ref 19–32)
Calcium: 7.9 mg/dL — ABNORMAL LOW (ref 8.4–10.5)
Chloride: 102 mEq/L (ref 96–112)
Creatinine, Ser: 1.21 mg/dL (ref 0.50–1.35)
GFR calc non Af Amer: 59 mL/min — ABNORMAL LOW (ref >90)
Sodium: 138 mEq/L (ref 135–145)

## 2012-04-30 LAB — CBC
HCT: 28.7 % — ABNORMAL LOW (ref 39.0–52.0)
Hemoglobin: 9.1 g/dL — ABNORMAL LOW (ref 13.0–17.0)
MCH: 27.8 pg (ref 26.0–34.0)
MCHC: 31.7 g/dL (ref 30.0–36.0)
MCV: 87.8 fL (ref 78.0–100.0)
RBC: 3.27 MIL/uL — ABNORMAL LOW (ref 4.22–5.81)

## 2012-04-30 LAB — GLUCOSE, CAPILLARY
Glucose-Capillary: 45 mg/dL — ABNORMAL LOW (ref 70–99)
Glucose-Capillary: 80 mg/dL (ref 70–99)
Glucose-Capillary: 78 mg/dL (ref 70–99)
Glucose-Capillary: 45 mg/dL — ABNORMAL LOW (ref 70–99)
Glucose-Capillary: 89 mg/dL (ref 70–99)

## 2012-04-30 LAB — RAPID URINE DRUG SCREEN, HOSP PERFORMED
Amphetamines: NOT DETECTED
Benzodiazepines: NOT DETECTED
Cocaine: NOT DETECTED
Opiates: NOT DETECTED

## 2012-04-30 LAB — MAGNESIUM: Magnesium: 1.6 mg/dL (ref 1.5–2.5)

## 2012-04-30 LAB — TROPONIN I: Troponin I: <0.3 ng/mL (ref <0.30)

## 2012-04-30 MED ORDER — ONDANSETRON HCL 4 MG/2ML IJ SOLN
4.0000 mg | Freq: Four times a day (QID) | INTRAMUSCULAR | Status: DC | PRN
  Administered 2012-05-01 – 2012-05-05 (×4): 4 mg via INTRAVENOUS
  Filled 2012-04-30 (×5): qty 2

## 2012-04-30 MED ORDER — KCL IN DEXTROSE-NACL 40-5-0.9 MEQ/L-%-% IV SOLN
INTRAVENOUS | Status: DC
  Administered 2012-04-30 – 2012-05-01 (×3): via INTRAVENOUS
  Filled 2012-04-30 (×4): qty 1000

## 2012-04-30 MED ORDER — PANTOPRAZOLE SODIUM 40 MG PO TBEC
40.0000 mg | DELAYED_RELEASE_TABLET | Freq: Two times a day (BID) | ORAL | Status: DC
  Administered 2012-04-30 – 2012-05-04 (×10): 40 mg via ORAL
  Filled 2012-04-30 (×11): qty 1

## 2012-04-30 MED ORDER — DEXTROSE 50 % IV SOLN
INTRAVENOUS | Status: AC
  Administered 2012-04-30: 25 mL
  Filled 2012-04-30: qty 50

## 2012-04-30 MED ORDER — ONDANSETRON HCL 4 MG PO TABS: 4.0000 mg | ORAL_TABLET | Freq: Four times a day (QID) | ORAL | Status: DC | PRN

## 2012-04-30 MED ORDER — CLOPIDOGREL BISULFATE 75 MG PO TABS
75.0000 mg | ORAL_TABLET | Freq: Every day | ORAL | Status: DC
  Administered 2012-04-30 – 2012-05-06 (×7): 75 mg via ORAL
  Filled 2012-04-30 (×9): qty 1

## 2012-04-30 MED ORDER — SODIUM CHLORIDE 0.9 % IJ SOLN
3.0000 mL | Freq: Two times a day (BID) | INTRAMUSCULAR | Status: DC
  Administered 2012-04-30 – 2012-05-06 (×11): 3 mL via INTRAVENOUS

## 2012-04-30 MED ORDER — METOPROLOL TARTRATE 12.5 MG HALF TABLET
12.5000 mg | ORAL_TABLET | Freq: Two times a day (BID) | ORAL | Status: DC
  Administered 2012-04-30 – 2012-05-04 (×10): 12.5 mg via ORAL
  Filled 2012-04-30 (×11): qty 1

## 2012-04-30 MED ORDER — AMLODIPINE BESYLATE 10 MG PO TABS
10.0000 mg | ORAL_TABLET | Freq: Every day | ORAL | Status: DC
  Administered 2012-04-30 – 2012-05-06 (×7): 10 mg via ORAL
  Filled 2012-04-30 (×7): qty 1

## 2012-04-30 MED ORDER — ENOXAPARIN SODIUM 30 MG/0.3ML ~~LOC~~ SOLN
30.0000 mg | SUBCUTANEOUS | Status: DC
  Administered 2012-04-30 – 2012-05-01 (×2): 30 mg via SUBCUTANEOUS
  Filled 2012-04-30 (×2): qty 0.3

## 2012-04-30 MED ORDER — ONDANSETRON HCL 4 MG/2ML IJ SOLN: 4.0000 mg | Freq: Three times a day (TID) | INTRAMUSCULAR | Status: DC | PRN

## 2012-04-30 MED ORDER — SODIUM CHLORIDE 0.9 % IV SOLN: INTRAVENOUS | Status: DC

## 2012-04-30 MED ORDER — POLYETHYLENE GLYCOL 3350 17 G PO PACK
17.0000 g | PACK | Freq: Every day | ORAL | Status: DC | PRN
  Filled 2012-04-30: qty 1

## 2012-04-30 MED ORDER — MAGNESIUM SULFATE 50 % IJ SOLN
3.0000 g | Freq: Once | INTRAMUSCULAR | Status: AC
  Administered 2012-04-30: 3 g via INTRAVENOUS
  Filled 2012-04-30: qty 6

## 2012-04-30 MED ORDER — SODIUM CHLORIDE 0.9 % IV SOLN
INTRAVENOUS | Status: DC
  Administered 2012-04-30: 02:00:00 via INTRAVENOUS

## 2012-04-30 MED ORDER — ATORVASTATIN CALCIUM 40 MG PO TABS
40.0000 mg | ORAL_TABLET | Freq: Every day | ORAL | Status: DC
  Administered 2012-04-30 – 2012-05-06 (×7): 40 mg via ORAL
  Filled 2012-04-30 (×7): qty 1

## 2012-04-30 MED ORDER — BOOST / RESOURCE BREEZE PO LIQD
1.0000 | Freq: Three times a day (TID) | ORAL | Status: DC
  Administered 2012-04-30 – 2012-05-06 (×16): 1 via ORAL

## 2012-04-30 MED ORDER — TAMSULOSIN HCL 0.4 MG PO CAPS
0.4000 mg | ORAL_CAPSULE | Freq: Every day | ORAL | Status: DC
  Administered 2012-04-30 – 2012-05-06 (×7): 0.4 mg via ORAL
  Filled 2012-04-30 (×7): qty 1

## 2012-04-30 MED ORDER — POTASSIUM CHLORIDE CRYS ER 20 MEQ PO TBCR
40.0000 meq | EXTENDED_RELEASE_TABLET | ORAL | Status: AC
  Administered 2012-04-30 (×3): 40 meq via ORAL
  Filled 2012-04-30 (×3): qty 2

## 2012-04-30 NOTE — Progress Notes (Signed)
[facility] PROGRESS NOTE  [name] BJY:[id number] DOB: 10/06/1940 DOA: 04/29/2012 PCP: [name], MD  Assessment/Plan:  #1 acute renal failure Likely secondary to prerenal azotemia secondary to volume depletion. Renal function improved with IV fluids. Continue IV fluids and follow.  #2 hypokalemia Probably likely secondary to GI losses. Replete. Keep magnesium greater than 2.  #3 leukocytosis Questionable etiology. Chest x-ray is negative. Urinalysis is negative. Lactic acidosis on admission was elevated however patient was also severely dehydrated. Patient is currently afebrile. Will monitor for now. Repeat lactic acid level. Will hold off on antibiotics at this time.  #4 anemia Likely secondary to anemia of chronic disease. Patient with no overt GI bleed. May have a dilutional component. Will check an anemia panel. Follow H&H.  #5 diabetes mellitus type 2/hypoglycemia Patient noted to be hypoglycemic this morning with CBGs in the 45. Patient will be placed on D5 normal saline. Place patient on a regular diet. Hold oral hypoglycemic agents. Check CBGs every 4.  #6 hypertension Stable. Continue Norvasc and metoprolol and Flomax.  #7 dehydration IV fluids.  #8 history of atrial fibrillation Continue metoprolol for rate control. Plavix on anticoagulations.  #9 history of CVA Stable. Continue Plavix.  #10 history of stromal cell tumor Will need to followup with oncology as an outpatient.  #11 prophylaxis PPI for GI prophylaxis. Lovenox for DVT prophylaxis.  Code Status: Full Family Communication: Updated patient no family at bedside. Disposition Plan: Home when medically stable   Consultants:  None  Procedures:  chest x-ray 04/29/2012 CT head 04/29/2012  Antibiotics:  None  HPI/Subjective: Patient sleeping but easily arousable. Patient c/o weakness. Patient states he feels the same as on presentation. Patient asking for  food.  Objective: Filed Vitals:   04/30/12 0115 04/30/12 0117 04/30/12 0226 04/30/12 0539  BP: 133/60 118/56  123/49  Pulse: 88 102 74 78  Temp:    97.8 F (36.6 C)  TempSrc:    Oral  Resp:    16  Height:      Weight:      SpO2:    99%    Intake/Output Summary (Last 24 hours) at 04/30/12 1020 Last data filed at 04/30/12 0645  Gross per 24 hour  Intake    240 ml  Output    200 ml  Net     40 ml   Filed Weights   04/30/12 0057  Weight: 71.2 kg (156 lb 15.5 oz)    Exam:   General:  NAD. SLEEPING BUT EASILY AROUSABLE  Cardiovascular: RRR  Respiratory: CTAB anterior lung fields  Abdomen: Soft/NT/ND/+BS  EXTREMITIES: No c/c/e  Data Reviewed: Basic Metabolic Panel:  Recent Labs Lab 04/29/12 1742 04/30/12 0730  NA 140 138  K 3.5 2.7*  CL 104 102  CO2  --  24  GLUCOSE 78 32*  BUN 20 16  CREATININE 1.70* 1.21  CALCIUM  --  7.9*  MG  --  1.6   Liver Function Tests:  Recent Labs Lab 04/29/12 1715 04/30/12 0730  AST 12 11  ALT 6 <5  ALKPHOS 109 86  BILITOT 0.3 0.2*  PROT 7.0 6.1  ALBUMIN 2.8* 2.5*   No results found for this basename: LIPASE, AMYLASE,  in the last 168 hours No results found for this basename: AMMONIA,  in the last 168 hours CBC:  Recent Labs Lab 04/29/12 1715 04/29/12 1742 04/30/12 0730  WBC 12.5*  --  11.7*  NEUTROABS 9.9*  --   --   HGB 10.5*  13.3 9.1*  HCT 33.2* 39.0 28.7*  MCV 88.8  --  87.8  PLT 582*  --  521*   Cardiac Enzymes:  Recent Labs Lab 04/30/12 0105 04/30/12 0730  TROPONINI <0.30 <0.30   BNP (last 3 results) No results found for this basename: PROBNP,  in the last 8760 hours CBG:  Recent Labs Lab 04/23/12 1229 04/30/12 0748 04/30/12 0819  GLUCAP 115* 45* 80    No results found for this or any previous visit (from the past 240 hour(s)).   Studies: Ct Head Wo Contrast  04/29/2012  *RADIOLOGY REPORT*  Clinical Data: 72 year old male with altered mental status.  CT HEAD WITHOUT CONTRAST   Technique:  Contiguous axial images were obtained from the base of the skull through the vertex without contrast.  Comparison: 04/05/2012 and 07/28/2010 head CTs  Findings: Mild generalized cerebral volume loss and chronic small vessel white matter ischemic changes again noted. A remote right cerebellar infarct is unchanged. No acute intracranial abnormalities are identified, including mass lesion or mass effect, hydrocephalus, extra-axial fluid collection, midline shift, hemorrhage, or acute infarction.  The visualized bony calvarium is unremarkable.  IMPRESSION: No evidence of acute intracranial abnormality.  Chronic small vessel white matter ischemic changes and remote right cerebellar infarct.   Original Report Authenticated By: Harmon Pier, [name]    Dg Chest Port 1 View  04/29/2012  *RADIOLOGY REPORT*  Clinical Data: Hypertension and diabetes.  Complains of weakness, fatigue, and altered mental status.  PORTABLE CHEST - 1 VIEW  Comparison: 04/05/2012  Findings: Stable postoperative changes in the mediastinum. Borderline heart size with normal pulmonary vascularity.  This is likely normal for portable technique.  No focal consolidation or airspace disease.  No blunting of costophrenic angles.  No pneumothorax.  Mediastinal contours appear intact.  Degenerative changes in the thoracic spine and shoulders.  No significant change since previous study.  IMPRESSION: No evidence of active pulmonary disease.   Original Report Authenticated By: Burman Nieves, [name]     Scheduled Meds: . amLODipine  10 mg Oral Daily  . atorvastatin  40 mg Oral Daily  . clopidogrel  75 mg Oral Q breakfast  . enoxaparin (LOVENOX) injection  30 mg Subcutaneous Q24H  . feeding supplement  1 Container Oral TID BM  . magnesium sulfate 1 - 4 g bolus IVPB  3 g Intravenous Once  . metoprolol tartrate  12.5 mg Oral BID  . pantoprazole  40 mg Oral BID  . potassium chloride  40 mEq Oral Q4H  . sodium chloride  1,000 mL Intravenous  Once  . sodium chloride  3 mL Intravenous Q12H  . tamsulosin  0.4 mg Oral Daily   Continuous Infusions: . dextrose 5 % and 0.9 % NaCl with KCl 40 mEq/L      Principal Problem:   Acute on chronic renal failure Active Problems:   Atrial fibrillation   Anemia   Lactic acidosis   Leukocytosis   H/O: CVA (cerebrovascular accident)   Depression   Diabetes   HTN (hypertension)   FTT (failure to thrive) in adult   Dehydration   Stromal cell tumor   Hypokalemia   Leukocytosis, unspecified   Hypoglycemia    Time spent: > 30 MINS    [facility]  [facility] Pager [phone]. If 7PM-7AM, please contact night-coverage at [email or web address], password [email or web address] 04/30/2012, 10:20 AM  LOS: 1 day

## 2012-04-30 NOTE — Progress Notes (Signed)
Hypoglycemic Event  CBG: 45  Treatment: D50 IV 25 mL  Symptoms: Hungry  Follow-up CBG: Time:0815 CBG Result:80  Possible Reasons for Event: Other: NPO  Comments/MD notified:Marga Melnick, Charisse Wendell K  Remember to initiate Hypoglycemia Order Set & complete

## 2012-04-30 NOTE — Progress Notes (Signed)
CRITICAL VALUE ALERT  Critical value received:  K 2.7  Date of notification:  3/15  Time of notification:  0915  Critical value read back:yes  Nurse who received alert:  [place]  MD notified (1st page):  Janee Morn  Time of first page:  0920  MD notified (2nd page):na  Time of second page:na  Responding MD:  Janee Morn  Time MD responded:  [phone]

## 2012-05-01 DIAGNOSIS — E162 Hypoglycemia, unspecified: Secondary | ICD-10-CM

## 2012-05-01 DIAGNOSIS — E876 Hypokalemia: Secondary | ICD-10-CM

## 2012-05-01 DIAGNOSIS — R4182 Altered mental status, unspecified: Secondary | ICD-10-CM

## 2012-05-01 LAB — VITAMIN B12: Vitamin B-12: 277 pg/mL (ref 211–911)

## 2012-05-01 LAB — GLUCOSE, CAPILLARY
Glucose-Capillary: 122 mg/dL — ABNORMAL HIGH (ref 70–99)
Glucose-Capillary: 180 mg/dL — ABNORMAL HIGH (ref 70–99)
Glucose-Capillary: 206 mg/dL — ABNORMAL HIGH (ref 70–99)

## 2012-05-01 LAB — FERRITIN: Ferritin: 69 ng/mL (ref 22–322)

## 2012-05-01 LAB — BASIC METABOLIC PANEL
CO2: 24 mEq/L (ref 19–32)
Chloride: 107 mEq/L (ref 96–112)
Creatinine, Ser: 1.05 mg/dL (ref 0.50–1.35)

## 2012-05-01 LAB — CBC
HCT: 29 % — ABNORMAL LOW (ref 39.0–52.0)
MCV: 88.7 fL (ref 78.0–100.0)
RBC: 3.27 MIL/uL — ABNORMAL LOW (ref 4.22–5.81)
RDW: 15 % (ref 11.5–15.5)
WBC: 11.3 10*3/uL — ABNORMAL HIGH (ref 4.0–10.5)

## 2012-05-01 LAB — IRON AND TIBC: Iron: 29 ug/dL — ABNORMAL LOW (ref 42–135)

## 2012-05-01 MED ORDER — ENOXAPARIN SODIUM 40 MG/0.4ML ~~LOC~~ SOLN
40.0000 mg | SUBCUTANEOUS | Status: DC
  Administered 2012-05-02 – 2012-05-06 (×5): 40 mg via SUBCUTANEOUS
  Filled 2012-05-01 (×5): qty 0.4

## 2012-05-01 MED ORDER — ENSURE COMPLETE PO LIQD
237.0000 mL | Freq: Two times a day (BID) | ORAL | Status: DC
  Administered 2012-05-01 – 2012-05-06 (×7): 237 mL via ORAL

## 2012-05-01 MED ORDER — SODIUM CHLORIDE 0.9 % IV SOLN
INTRAVENOUS | Status: DC
  Administered 2012-05-01 – 2012-05-02 (×2): via INTRAVENOUS

## 2012-05-01 MED ORDER — QUETIAPINE FUMARATE ER 200 MG PO TB24
200.0000 mg | ORAL_TABLET | ORAL | Status: DC
  Administered 2012-05-01 – 2012-05-05 (×4): 200 mg via ORAL
  Filled 2012-05-01 (×6): qty 1

## 2012-05-01 NOTE — Progress Notes (Signed)
Pt's 0400 CBG this am was 150. Paged MD Burnadette Peter) for possible insulin order. MD called back, ordered to wait for next sugar at 0800 and decide based on those results. Will continue to monitor pt. - Christell Faith, RN

## 2012-05-01 NOTE — Progress Notes (Signed)
[facility] PROGRESS NOTE  [name] RUE:[id number] DOB: Dec 31, 1940 DOA: 04/29/2012 PCP: [name], MD  Assessment/Plan:  #1 acute renal failure Likely secondary to prerenal azotemia secondary to volume depletion. Renal function improved with IV fluids. Continue IV fluids and follow.  #2 hypokalemia Probably likely secondary to GI losses. Repleted. Keep magnesium greater than 2.  #3 leukocytosis Questionable etiology. Chest x-ray is negative. Urinalysis is negative. Lactic acidosis on admission was elevated however patient was also severely dehydrated, and lactic acid trended down. Patient is currently afebrile. Will monitor for now. Will hold off on antibiotics at this time.  #4 anemia Likely secondary to anemia of chronic disease. Patient with no overt GI bleed. May have a dilutional component. Will check an anemia panel. Follow H&H.  #5 diabetes mellitus type 2/hypoglycemia Patient noted to be hypoglycemic yesterday with CBG 45, and last night. CBG this morning is 162. D/C'd D5NS yesterday. Continue on a regular diet. Hold oral hypoglycemic agents. Check CBGs every 4.  #6 hypertension Stable. Continue Norvasc and metoprolol and Flomax.  #7 dehydration IV fluids.  #8 history of atrial fibrillation Continue metoprolol for rate control. Plavix for anticoagulation.  #9 history of CVA Stable. Continue Plavix.  #10 history of stromal cell tumor Will need to followup with oncology as an outpatient.  #11 prophylaxis PPI for GI prophylaxis. Lovenox for DVT prophylaxis.  Code Status: Full Family Communication: Updated patient no family at bedside. Disposition Plan: Home when medically stable   Consultants:  None  Procedures:  chest x-ray 04/29/2012 CT head 04/29/2012  Antibiotics:  None  HPI/Subjective: Patient sleeping but easily arousable. Patient c/o weakness. Patient states he feels the same as on presentation. Patient asking for his seroquel  200mg  be given at 730pm. Per nursing patient with low CBG 40s last night.  Objective: Filed Vitals:   04/30/12 1531 04/30/12 2044 04/30/12 2220 05/01/12 0554  BP: 107/59 115/66 122/55 141/59  Pulse: 80 77 75 84  Temp: 98.6 F (37 C) 98.4 F (36.9 C)  98.4 F (36.9 C)  TempSrc: Oral Oral  Oral  Resp: 20 18  16   Height:      Weight:      SpO2: 97% 97%  99%    Intake/Output Summary (Last 24 hours) at 05/01/12 1007 Last data filed at 05/01/12 0900  Gross per 24 hour  Intake    240 ml  Output   1825 ml  Net  -1585 ml   Filed Weights   04/30/12 0057  Weight: 71.2 kg (156 lb 15.5 oz)    Exam:   General:  NAD. SLEEPING BUT EASILY AROUSABLE  Cardiovascular: RRR  Respiratory: CTAB anterior lung fields  Abdomen: Soft/NT/ND/+BS  EXTREMITIES: No c/c/e  Data Reviewed: Basic Metabolic Panel:  Recent Labs Lab 04/29/12 1742 04/30/12 0730 05/01/12 0500  NA 140 138 139  K 3.5 2.7* 4.2  CL 104 102 107  CO2  --  24 24  GLUCOSE 78 32* 154*  BUN 20 16 8   CREATININE 1.70* 1.21 1.05  CALCIUM  --  7.9* 7.8*  MG  --  1.6 1.9   Liver Function Tests:  Recent Labs Lab 04/29/12 1715 04/30/12 0730  AST 12 11  ALT 6 <5  ALKPHOS 109 86  BILITOT 0.3 0.2*  PROT 7.0 6.1  ALBUMIN 2.8* 2.5*   No results found for this basename: LIPASE, AMYLASE,  in the last 168 hours No results found for this basename: AMMONIA,  in the last 168 hours CBC:  Recent Labs Lab 04/29/12 1715 04/29/12 1742 04/30/12 0730 05/01/12 0500  WBC 12.5*  --  11.7* 11.3*  NEUTROABS 9.9*  --   --   --   HGB 10.5* 13.3 9.1* 9.1*  HCT 33.2* 39.0 28.7* 29.0*  MCV 88.8  --  87.8 88.7  PLT 582*  --  521* 545*   Cardiac Enzymes:  Recent Labs Lab 04/30/12 0105 04/30/12 0730 04/30/12 1315  TROPONINI <0.30 <0.30 <0.30   BNP (last 3 results) No results found for this basename: PROBNP,  in the last 8760 hours CBG:  Recent Labs Lab 04/30/12 1958 04/30/12 2026 05/01/12 0008 05/01/12 0410  05/01/12 0755  GLUCAP 45* 89 122* 150* 162*    No results found for this or any previous visit (from the past 240 hour(s)).   Studies: Ct Head Wo Contrast  04/29/2012  *RADIOLOGY REPORT*  Clinical Data: 72 year old male with altered mental status.  CT HEAD WITHOUT CONTRAST  Technique:  Contiguous axial images were obtained from the base of the skull through the vertex without contrast.  Comparison: 04/05/2012 and 07/28/2010 head CTs  Findings: Mild generalized cerebral volume loss and chronic small vessel white matter ischemic changes again noted. A remote right cerebellar infarct is unchanged. No acute intracranial abnormalities are identified, including mass lesion or mass effect, hydrocephalus, extra-axial fluid collection, midline shift, hemorrhage, or acute infarction.  The visualized bony calvarium is unremarkable.  IMPRESSION: No evidence of acute intracranial abnormality.  Chronic small vessel white matter ischemic changes and remote right cerebellar infarct.   Original Report Authenticated By: Harmon Pier, [name]    Dg Chest Port 1 View  04/29/2012  *RADIOLOGY REPORT*  Clinical Data: Hypertension and diabetes.  Complains of weakness, fatigue, and altered mental status.  PORTABLE CHEST - 1 VIEW  Comparison: 04/05/2012  Findings: Stable postoperative changes in the mediastinum. Borderline heart size with normal pulmonary vascularity.  This is likely normal for portable technique.  No focal consolidation or airspace disease.  No blunting of costophrenic angles.  No pneumothorax.  Mediastinal contours appear intact.  Degenerative changes in the thoracic spine and shoulders.  No significant change since previous study.  IMPRESSION: No evidence of active pulmonary disease.   Original Report Authenticated By: Burman Nieves, [name]     Scheduled Meds: . amLODipine  10 mg Oral Daily  . atorvastatin  40 mg Oral Daily  . clopidogrel  75 mg Oral Q breakfast  . enoxaparin (LOVENOX) injection  30 mg  Subcutaneous Q24H  . feeding supplement  1 Container Oral TID BM  . metoprolol tartrate  12.5 mg Oral BID  . pantoprazole  40 mg Oral BID  . sodium chloride  1,000 mL Intravenous Once  . sodium chloride  3 mL Intravenous Q12H  . tamsulosin  0.4 mg Oral Daily   Continuous Infusions: . sodium chloride 100 mL/hr at 05/01/12 1610    Principal Problem:   Acute on chronic renal failure Active Problems:   Atrial fibrillation   Anemia   Lactic acidosis   Leukocytosis   H/O: CVA (cerebrovascular accident)   Depression   Diabetes   HTN (hypertension)   FTT (failure to thrive) in adult   Dehydration   Stromal cell tumor   Hypokalemia   Leukocytosis, unspecified   Hypoglycemia    Time spent: > 30 MINS    [facility]  [facility] Pager [phone]. If 7PM-7AM, please contact night-coverage at [email or web address], password [facility] 05/01/2012, 10:07 AM  LOS: 2 days

## 2012-05-01 NOTE — Progress Notes (Signed)
Utilization Review Completed.Rigby Swamy T3/16/2014  

## 2012-05-01 NOTE — Progress Notes (Signed)
INITIAL NUTRITION ASSESSMENT  DOCUMENTATION CODES Per approved criteria  -Severe malnutrition in the context of chronic illness   INTERVENTION: Ensure Complete bid Resource Breeze tid to continue Encouraged intake  NUTRITION DIAGNOSIS: Inadequate oral intake related to poor appetite as evidenced by documented intake..   Goal: Intake of meals and supplements to meet >90% of estimated needs and prevent further weight loss.  Monitor:  Weight trend, intake, labs  Reason for Assessment: MST  72 y.o. male  Admitting Dx: Acute on chronic renal failure  ASSESSMENT: Patient reports very poor intake and appetite since the end of January.  Eating only 25% of meals.  Dislikes supplements but willing to try.  Nutrition Focused Physical Exam:  Subcutaneous Fat:  Orbital Region: mild Upper Arm Region: severe Thoracic and Lumbar Region: moderate  Muscle:  Temple Region: moderate Clavicle Bone Region: moderate Clavicle and Acromion Bone Region: moderate Scapular Bone Region: n/a Dorsal Hand: n/a Patellar Region: severe Anterior Thigh Region: severe Posterior Calf Region: severe  Edema: none noted  Patient now meets criteria for severe malnutrition relation to chronic illness AEB 18% weight loss in the last 2 months, <75% intake for >1 month, decreased muscle mass and body fat.    Height: Ht Readings from Last 1 Encounters:  04/30/12 5\' 6"  (1.676 m)    Weight: Wt Readings from Last 1 Encounters:  04/30/12 156 lb 15.5 oz (71.2 kg)    Ideal Body Weight: 136 lbs  % Ideal Body Weight: 115  Wt Readings from Last 10 Encounters:  04/30/12 156 lb 15.5 oz (71.2 kg)  04/29/12 160 lb 4.8 oz (72.712 kg)  04/15/12 189 lb (85.73 kg)  04/15/12 189 lb (85.73 kg)  04/15/12 189 lb (85.73 kg)  04/15/12 189 lb (85.73 kg)  04/15/12 189 lb (85.73 kg)  03/15/12 183 lb 6.8 oz (83.2 kg)  03/15/12 183 lb 6.8 oz (83.2 kg)  03/03/12 183 lb 6.4 oz (83.19 kg)    Usual Body Weight: 189  lbs  % Usual Body Weight: 83  BMI:  Body mass index is 25.35 kg/(m^2).  Estimated Nutritional Needs: Kcal: 1800-2000 Protein: 75-85 gm Fluid: 1.8L  Skin: intact  Diet Order: Dysphagia 3 thin with resource Breeze tid  EDUCATION NEEDS: -No education needs identified at this time   Intake/Output Summary (Last 24 hours) at 05/01/12 1359 Last data filed at 05/01/12 1237  Gross per 24 hour  Intake    120 ml  Output   1525 ml  Net  -1405 ml     Labs:   Recent Labs Lab 04/29/12 1742 04/30/12 0730 05/01/12 0500  NA 140 138 139  K 3.5 2.7* 4.2  CL 104 102 107  CO2  --  24 24  BUN 20 16 8   CREATININE 1.70* 1.21 1.05  CALCIUM  --  7.9* 7.8*  MG  --  1.6 1.9  GLUCOSE 78 32* 154*    CBG (last 3)   Recent Labs  05/01/12 0410 05/01/12 0755 05/01/12 1215  GLUCAP 150* 162* 180*    Scheduled Meds: . amLODipine  10 mg Oral Daily  . atorvastatin  40 mg Oral Daily  . clopidogrel  75 mg Oral Q breakfast  . [START ON 05/02/2012] enoxaparin (LOVENOX) injection  40 mg Subcutaneous Q24H  . feeding supplement  1 Container Oral TID BM  . metoprolol tartrate  12.5 mg Oral BID  . pantoprazole  40 mg Oral BID  . QUEtiapine  200 mg Oral Q24H  . sodium chloride  1,000 mL Intravenous Once  . sodium chloride  3 mL Intravenous Q12H  . tamsulosin  0.4 mg Oral Daily    Continuous Infusions: . sodium chloride 100 mL/hr at 05/01/12 1610    Past Medical History  Diagnosis Date  . Coronary artery disease   . Hypertension   . Depression   . Diabetes mellitus without complication   . Stroke 73 years old  . GERD (gastroesophageal reflux disease)     hx of  . Arthritis     hx of  . Erectile dysfunction   . Atrial fibrillation     Past Surgical History  Procedure Laterality Date  . Coronary artery bypass graft    . Eye surgery      cataracts bilateral  . Carpal tunnel release      bilateral  . Tonsillectomy    . Penile prosthesis implant  03/15/2012    Procedure: PENILE  PROTHESIS INFLATABLE;  Surgeon: Lindaann Slough, MD;  Location: [facility];  Service: Urology;  Laterality: N/A;  . Circumcision  03/15/2012    Procedure: CIRCUMCISION ADULT;  Surgeon: Lindaann Slough, MD;  Location: [facility];  Service: Urology;  Laterality: N/A;  . Esophagogastroduodenoscopy N/A 04/06/2012    Procedure: ESOPHAGOGASTRODUODENOSCOPY (EGD);  Surgeon: Graylin Shiver, MD;  Location: [facility] ENDOSCOPY;  Service: Endoscopy;  Laterality: N/A;  . Colonoscopy N/A 04/14/2012    Procedure: COLONOSCOPY;  Surgeon: Barrie Folk, MD;  Location: [facility] ENDOSCOPY;  Service: Endoscopy;  Laterality: N/A;  . Gastric resection N/A 04/15/2012    Procedure: Partial Gastrectomy;  Surgeon: Cherylynn Ridges, MD;  Location: [facility] OR;  Service: General;  Laterality: N/A;    Oran Rein, RD, LDN Clinical Inpatient Dietitian Pager:  [phone] Weekend and after hours pager:  [phone]

## 2012-05-01 NOTE — Evaluation (Addendum)
Clinical/Bedside Swallow Evaluation Patient Details  Name: [name] MRN: [id number] Date of Birth: 01-Apr-1940  Today's Date: 05/01/2012 Time: 0820-0845 SLP Time Calculation (min): 25 min  Past Medical History:  Past Medical History  Diagnosis Date  . Coronary artery disease   . Hypertension   . Depression   . Diabetes mellitus without complication   . Stroke 72 years old  . GERD (gastroesophageal reflux disease)     hx of  . Arthritis     hx of  . Erectile dysfunction   . Atrial fibrillation    Past Surgical History:  Past Surgical History  Procedure Laterality Date  . Coronary artery bypass graft    . Eye surgery      cataracts bilateral  . Carpal tunnel release      bilateral  . Tonsillectomy    . Penile prosthesis implant  03/15/2012    Procedure: PENILE PROTHESIS INFLATABLE;  Surgeon: Lindaann Slough, MD;  Location: [facility];  Service: Urology;  Laterality: N/A;  . Circumcision  03/15/2012    Procedure: CIRCUMCISION ADULT;  Surgeon: Lindaann Slough, MD;  Location: [facility];  Service: Urology;  Laterality: N/A;  . Esophagogastroduodenoscopy N/A 04/06/2012    Procedure: ESOPHAGOGASTRODUODENOSCOPY (EGD);  Surgeon: Graylin Shiver, MD;  Location: [facility] ENDOSCOPY;  Service: Endoscopy;  Laterality: N/A;  . Colonoscopy N/A 04/14/2012    Procedure: COLONOSCOPY;  Surgeon: Barrie Folk, MD;  Location: [facility] ENDOSCOPY;  Service: Endoscopy;  Laterality: N/A;  . 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  Has had no appetite.  Lives alone.  HH first visit today to his house and PT.  No fevers.  No n/v/d.  No rashes.  Losing weight.  Went to onc office for evaluation of further treatment options and was noted to be  very weak and dehydrated so sent to ED for evaluation. CXR: 04/29/12: No evidence of active pulmonary disease. CT: No evidence of acute intracranial abnormality.    Referred for BSE to assess risk for aspiration.  Patient's description of dysphagia is "food sticking" with feeling of "throwing up" with some solid foods. " I have to drink a lot of liquid to get some food down".  Assessment / Plan / Recommendation Clinical Impression   Oropharyngeal dysphagia indicated with main component suspected esophageal dysphagia. Moderate throat clears  noted s/p swallow of large sips of thin liquid by cup in succession indicating possible penetration to cords due to suspected reduced airway closure.  Patient impulsive and required max verbal and tactile cues to slow rate of eating and take small sips.  Possible vocal cord edema as vocal quality hoarse.  Strategies of small cup sips, slow rate of eating, and alternating bites with sips reduced s/s of penetration vs. aspiration and reports of globus sensation.  Recommend downgrade to dysphagia 3 and continue thin liquids.  Full supervision with all meals to provide necessary cueing to ensure safety.  ST to follow in acute care setting for diet tolerance.     Aspiration Risk  Moderate    Diet Recommendation Dysphagia 3 (Mechanical Soft);Thin liquid   Liquid Administration via: Cup Medication Administration: Whole meds with liquid Supervision: Patient able to  self feed Compensations: Slow rate;Small sips/bites;Multiple dry swallows after each bite/sip;Follow solids with liquid Postural Changes and/or Swallow Maneuvers: Seated upright 90 degrees;Upright 30-60 min after meal    Other  Recommendations Oral Care Recommendations: Oral care BID   Follow Up Recommendations  Other (comment) (TBD)    Frequency and Duration min 2x/week  2 weeks       SLP Swallow Goals Patient will utilize recommended strategies during swallow to increase swallowing safety with:  Supervision/safety   Swallow Study Prior Functional Status   Lives at home     General Date of Onset: 04/29/12 HPI: 72 yo male with 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  Has had no appetite.  Lives alone.  HH first visit today to his house and PT.  No fevers.  No n/v/d.  No rashes.  Loosing weight.  Went to onc office for evaluation of further treatment options and was noted to be very weak and dehydrated so sent to ED for evaluation. Type of Study: Bedside swallow evaluation Diet Prior to this Study: Regular;Thin liquids Temperature Spikes Noted: No Respiratory Status: Room air History of Recent Intubation: No Behavior/Cognition: Alert;Cooperative;Pleasant mood Oral Cavity - Dentition: Adequate natural dentition Self-Feeding Abilities: Able to feed self Patient Positioning: Other (comment) (Sitting EOB) Baseline Vocal Quality: Hoarse;Low vocal intensity Volitional Cough: Strong Volitional Swallow: Able to elicit    Oral/Motor/Sensory Function Overall Oral Motor/Sensory Function: Appears within functional limits for tasks assessed Labial Symmetry: Abnormal symmetry left   Ice Chips Ice chips: Not tested   Thin Liquid Thin Liquid: Impaired Presentation: Cup Pharyngeal  Phase Impairments: Suspected delayed Swallow;Throat Clearing - Delayed    Nectar Thick Nectar Thick Liquid: Not tested   Honey Thick Honey Thick Liquid: Not tested   Puree Puree: Not tested   Solid   GO    Solid: Impaired Pharyngeal Phase Impairments: Suspected delayed Joneen Caraway MS, CCC-SLP [phone] [facility] 05/01/2012,9:15 AM

## 2012-05-02 DIAGNOSIS — R1310 Dysphagia, unspecified: Secondary | ICD-10-CM

## 2012-05-02 LAB — GLUCOSE, CAPILLARY
Glucose-Capillary: 171 mg/dL — ABNORMAL HIGH (ref 70–99)
Glucose-Capillary: 191 mg/dL — ABNORMAL HIGH (ref 70–99)
Glucose-Capillary: 172 mg/dL — ABNORMAL HIGH (ref 70–99)

## 2012-05-02 LAB — BASIC METABOLIC PANEL
Chloride: 104 mEq/L (ref 96–112)
GFR calc Af Amer: >90 mL/min (ref >90)
Potassium: 3.4 mEq/L — ABNORMAL LOW (ref 3.5–5.1)
Sodium: 136 mEq/L (ref 135–145)

## 2012-05-02 LAB — CBC
HCT: 27.7 % — ABNORMAL LOW (ref 39.0–52.0)
Hemoglobin: 8.9 g/dL — ABNORMAL LOW (ref 13.0–17.0)
RDW: 14.9 % (ref 11.5–15.5)
WBC: 10.8 10*3/uL — ABNORMAL HIGH (ref 4.0–10.5)

## 2012-05-02 MED ORDER — POTASSIUM CHLORIDE CRYS ER 20 MEQ PO TBCR
40.0000 meq | EXTENDED_RELEASE_TABLET | Freq: Once | ORAL | Status: AC
  Administered 2012-05-02: 40 meq via ORAL
  Filled 2012-05-02 (×2): qty 2

## 2012-05-02 NOTE — Clinical Documentation Improvement (Signed)
MALNUTRITION DOCUMENTATION CLARIFICATION  THIS DOCUMENT IS NOT A PERMANENT PART OF THE MEDICAL RECORD  TO RESPOND TO THE THIS QUERY, FOLLOW THE INSTRUCTIONS BELOW:  1. If needed, update documentation for the patient's encounter via the notes activity.  2. Access this query again and click edit on the In [employer].  3. After updating, or not, click F2 to complete all highlighted (required) fields concerning your review. Select "additional documentation in the medical record" OR "no additional documentation provided".  4. Click Sign note button.  5. The deficiency will fall out of your In Basket *Please let us know if you are not able to complete this workflow by phone or e-mail (listed below).  Please update your documentation within the medical record to reflect your response to this query.                                                                                        05/02/12   Dear Dr.[facility],  In a better effort to capture your patient's severity of illness, reflect appropriate length of stay and utilization of resources, a review of the patient medical record has revealed the following indicators.    Based on your clinical judgment, please clarify and document in a progress note and/or discharge summary the clinical condition associated with the following supporting information:  In responding to this query please exercise your independent judgment.  The fact that a query is asked, does not imply that any particular answer is desired or expected.  05/01/12 Nutrition assessment w/ documentation criteria for "Severe malnutrition in the context of chronic illness ". For Dx specificity & severity please help validate nutr documentation for cond being eval'd, mon'd & tx'd. Thank you  Possible Clinical Conditions?  . Mild Malnutrition  . Moderate Malnutrition . Severe Malnutrition   . Protein Calorie Malnutrition . Severe Protein Calorie  Malnutrition  . Emaciation  . Cachexia   Other Condition (please specify) Cannot Clinically Determine  Supporting Information: Risk Factors: See Nutrition Assessment 05/01/12  Signs & Symptoms: See Nutrition Assessment 05/01/12  -Diagnostics: See Nutrition Assessment 05/01/12  Treatments: See Nutrition Assessment 05/01/12  -Nutrition Consult: See Nutrition Assessment 05/01/12  You may use possible, probable, or suspect with inpatient documentation. possible, probable, suspected diagnoses MUST be documented at the time of discharge  Reviewed:  no additional documentation provided; 05/04/12: no new doc in notes by PMD.. ORM  Thank You,  Toribio Harbour, RN, BSN, CCDS Certified Clinical Documentation Specialist Pager: [phone]  Health Information Management 

## 2012-05-02 NOTE — Care Management (Signed)
Active pt with [facility] branch-- [phone],  HHPT,RN..will ned resumption of care orders  Thank You Venia Minks  [phone].

## 2012-05-02 NOTE — Evaluation (Signed)
Occupational Therapy Evaluation Patient Details Name: [name] MRN: [id number] DOB: 05/25/1940 Today's Date: 05/02/2012 Time: 4098-1191 OT Time Calculation (min): 8 min  OT Assessment / Plan / Recommendation Clinical Impression  72 yo male admitted acute renal failure, hyokalemia and leukocytosis that coudl benefit from skilled OT acutely.    OT Assessment  Patient needs continued OT Services    Follow Up Recommendations  Home health OT    Barriers to Discharge      Equipment Recommendations  None recommended by OT    Recommendations for Other Services    Frequency  Min 2X/week    Precautions / Restrictions Precautions Precautions: Fall Restrictions Weight Bearing Restrictions: No   Pertinent Vitals/Pain Reports fatigue    ADL  Eating/Feeding: Set up Where Assessed - Eating/Feeding: Chair Grooming: Wash/dry hands;Min guard Where Assessed - Grooming: Unsupported standing (bil elbows on sink) Toilet Transfer: Supervision/safety Toilet Transfer Method: Stand pivot (standing to void bladder) Toilet Transfer Equipment: Regular height toilet Toileting - Clothing Manipulation and Hygiene: Supervision/safety Where Assessed - Glass blower/designer Manipulation and Hygiene: Standing Equipment Used: Gait belt;Rolling walker Transfers/Ambulation Related to ADLs: Pt using RW to ambulate to restroom and completed 180 degree turn in bathroom with RW ADL Comments: Pt demonstrates bed mobility, static sitting, stand pivot to chair, self feeding, ambulation with RW to restroom, sink level grooming and return to supine. Pt demonstrates weakness and decr activity tolerance.     OT Diagnosis: Generalized weakness  OT Problem List: Decreased strength;Decreased activity tolerance;Impaired balance (sitting and/or standing);Decreased safety awareness OT Treatment Interventions: Self-care/ADL training;Therapeutic exercise;DME and/or AE instruction;Therapeutic activities;Patient/family  education;Balance training   OT Goals Acute Rehab OT Goals OT Goal Formulation: With patient Time For Goal Achievement: 05/16/12 Potential to Achieve Goals: Good ADL Goals Pt Will Perform Lower Body Bathing: with modified independence;Sit to stand from chair ADL Goal: Lower Body Bathing - Progress: Goal set today Pt Will Perform Lower Body Dressing: with modified independence;Sit to stand from chair ADL Goal: Lower Body Dressing - Progress: Goal set today Pt Will Transfer to Toilet: with modified independence;Ambulation;Regular height toilet ADL Goal: Toilet Transfer - Progress: Goal set today Miscellaneous OT Goals Miscellaneous OT Goal #1: Pt will complete two adl task static standing (sink level or toileting) without UE support Mod I OT Goal: Miscellaneous Goal #1 - Progress: Goal set today  Visit Information  Last OT Received On: 05/02/12 Assistance Needed: +1 PT/OT Co-Evaluation/Treatment: Yes    Subjective Data  Subjective: a really good friend- pt reports having assistance at d/c Patient Stated Goal: to return home   Prior Functioning     Home Living Lives With: Other (Comment);Friend(s) (really close friend) Available Help at Discharge: Available PRN/intermittently Type of Home: House Home Access: Stairs to enter [employer] of Steps: 4-5 Entrance Stairs-Rails: Right;Left Home Layout: One level Bathroom Shower/Tub: Forensic scientist: Standard Home Adaptive Equipment: Walker - rolling Prior Function Level of Independence: Independent with assistive device(s) Able to Take Stairs?: Yes Driving: Yes Vocation: Retired Musician: No difficulties;Other (comment) (very soft voice tone) Dominant Hand: Right         Vision/Perception Vision - History Baseline Vision: No visual deficits Patient Visual Report: No change from baseline   Cognition  Cognition Overall Cognitive Status: Appears within functional  limits for tasks assessed/performed Arousal/Alertness: Awake/alert Orientation Level: Appears intact for tasks assessed Behavior During Session: [facility] for tasks performed    Extremity/Trunk Assessment Right Upper Extremity Assessment RUE ROM/Strength/Tone: [facility] for tasks assessed RUE Coordination:  WFL - gross/fine motor (opening soap top by flipping open and closing with pincher ) Left Upper Extremity Assessment LUE ROM/Strength/Tone: WFL for tasks assessed LUE Coordination: WFL - gross motor Right Lower Extremity Assessment RLE ROM/Strength/Tone: Deficits RLE ROM/Strength/Tone Deficits: Noted some muscle shaking with testing, however WFL during functional tasks.  RLE Sensation: WFL - Light Touch RLE Coordination: WFL - gross/fine motor Left Lower Extremity Assessment LLE ROM/Strength/Tone: Deficits LLE ROM/Strength/Tone Deficits: noted some muscle shaking with testing, however WFL during functional tasks.  LLE Sensation: WFL - Light Touch LLE Coordination: WFL - gross/fine motor Trunk Assessment Trunk Assessment: Normal     Mobility Bed Mobility Bed Mobility: Supine to Sit;Sit to Supine Right Sidelying to Sit: 6: Modified independent (Device/Increase time);With rails;HOB flat Supine to Sit: 6: Modified independent (Device/Increase time);With rails (LE) Sit to Supine: 4: Min assist;HOB flat;With rail ((A) with LB) Details for Bed Mobility Assistance: Pt requires max encouragement to participate in tasks, however able to do at mod I level, min assist for trunk with getting back into bed.  Transfers Transfers: Sit to Stand;Stand to Sit Sit to Stand: 4: Min guard;With upper extremity assist;From bed Stand to Sit: 4: Min guard;With upper extremity assist;To bed Details for Transfer Assistance: Cues for hand placement and safety.      Exercise     Balance Balance Balance Assessed: Yes Dynamic Sitting Balance Dynamic Sitting - Balance Support: Bilateral upper extremity  supported;Feet supported Dynamic Sitting - Level of Assistance: 6: Modified independent (Device/Increase time) Static Standing Balance Static Standing - Balance Support: Right upper extremity supported;During functional activity Static Standing - Level of Assistance: 6: Modified independent (Device/Increase time) Static Standing - Comment/# of Minutes: Pt able to stand and use restroom holding onto rail.    End of Session OT - End of Session Equipment Utilized During Treatment: Gait belt Activity Tolerance: Patient tolerated treatment well Patient left: in bed;with call bell/phone within reach Nurse Communication: Mobility status;Precautions  GO     Advait, Buice 05/02/2012, 4:04 PM Pager: [phone]

## 2012-05-02 NOTE — Progress Notes (Signed)
[facility] PROGRESS NOTE  [name]:[id number] DOB: 08/09/1940 DOA: 04/29/2012 PCP: [name], MD  Assessment/Plan:  #1 acute renal failure Likely secondary to prerenal azotemia secondary to volume depletion. Renal function improved with IV fluids. Continue IV fluids and follow.  #2 hypokalemia Probably likely secondary to GI losses. Replete. Keep magnesium greater than 2.  #3 leukocytosis Questionable etiology. Chest x-ray is negative. Urinalysis is negative. Lactic acidosis on admission was elevated however patient was also severely dehydrated, and lactic acid trended down. Patient is currently afebrile. Will monitor for now. Will hold off on antibiotics at this time.  #4 anemia Likely secondary to anemia of chronic disease. Patient with no overt GI bleed. May have a dilutional component. Will check an anemia panel. Follow H&H.  #5 diabetes mellitus type 2/hypoglycemia Patient noted to be hypoglycemic yesterday with CBG 143-198. Continue on a regular diet. Hold oral hypoglycemic agents. Check CBGs ac and hs.  #6. Dysphagia Barium swallow and UGI series pending as patient with recent partial gastrectomy. GI consult pending.  #7 hypertension Stable. Continue Norvasc and metoprolol and Flomax.  #8 dehydration IV fluids.  #9 history of atrial fibrillation Continue metoprolol for rate control. Plavix for anticoagulation.  #10 history of CVA Stable. Continue Plavix.  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  #12 prophylaxis PPI for GI prophylaxis. Lovenox for DVT prophylaxis.  Code Status: Full Family Communication: Updated patient no family at bedside. Disposition Plan: Home when medically stable   Consultants:  GI pending  Procedures:  chest x-ray 04/29/2012 CT head 04/29/2012  Antibiotics:  None  HPI/Subjective: Patient sleeping but easily arousable. Patient c/o weakness. Patient states  he feels the same as on presentation. Patient c/o food getting stuck when he swallows and only able to eat small portions.  Objective: Filed Vitals:   05/01/12 0554 05/01/12 1446 05/01/12 2053 05/02/12 0417  BP: 141/59 135/55 122/49 141/58  Pulse: 84 78 80 73  Temp: 98.4 F (36.9 C) 98.4 F (36.9 C) 98.9 F (37.2 C) 98.4 F (36.9 C)  TempSrc: Oral Oral Oral Oral  Resp: 16 20 16 18   Height:      Weight:      SpO2: 99% 98% 100% 100%    Intake/Output Summary (Last 24 hours) at 05/02/12 1055 Last data filed at 05/02/12 0804  Gross per 24 hour  Intake 5842.08 ml  Output   1750 ml  Net 4092.08 ml   Filed Weights   04/30/12 0057  Weight: 71.2 kg (156 lb 15.5 oz)    Exam:   General:  NAD. SLEEPING BUT EASILY AROUSABLE  Cardiovascular: RRR  Respiratory: CTAB anterior lung fields  Abdomen: Soft/NT/ND/+BS  EXTREMITIES: No c/c/e  Data Reviewed: Basic Metabolic Panel:  Recent Labs Lab 04/29/12 1742 04/30/12 0730 05/01/12 0500 05/02/12 0430  NA 140 138 139 136  K 3.5 2.7* 4.2 3.4*  CL 104 102 107 104  CO2  --  24 24 23   GLUCOSE 78 32* 154* 186*  BUN 20 16 8  4*  CREATININE 1.70* 1.21 1.05 0.86  CALCIUM  --  7.9* 7.8* 7.8*  MG  --  1.6 1.9  --    Liver Function Tests:  Recent Labs Lab 04/29/12 1715 04/30/12 0730  AST 12 11  ALT 6 <5  ALKPHOS 109 86  BILITOT 0.3 0.2*  PROT 7.0 6.1  ALBUMIN 2.8* 2.5*   No results found for this basename: LIPASE, AMYLASE,  in the last  168 hours No results found for this basename: AMMONIA,  in the last 168 hours CBC:  Recent Labs Lab 04/29/12 1715 04/29/12 1742 04/30/12 0730 05/01/12 0500 05/02/12 0430  WBC 12.5*  --  11.7* 11.3* 10.8*  NEUTROABS 9.9*  --   --   --   --   HGB 10.5* 13.3 9.1* 9.1* 8.9*  HCT 33.2* 39.0 28.7* 29.0* 27.7*  MCV 88.8  --  87.8 88.7 88.5  PLT 582*  --  521* 545* 441*   Cardiac Enzymes:  Recent Labs Lab 04/30/12 0105 04/30/12 0730 04/30/12 1315  TROPONINI <0.30 <0.30 <0.30    BNP (last 3 results) No results found for this basename: PROBNP,  in the last 8760 hours CBG:  Recent Labs Lab 05/01/12 1215 05/01/12 1645 05/01/12 1956 05/02/12 0014 05/02/12 0803  GLUCAP 180* 206* 231* 248* 143*    No results found for this or any previous visit (from the past 240 hour(s)).   Studies: No results found.  Scheduled Meds: . amLODipine  10 mg Oral Daily  . atorvastatin  40 mg Oral Daily  . clopidogrel  75 mg Oral Q breakfast  . enoxaparin (LOVENOX) injection  40 mg Subcutaneous Q24H  . feeding supplement  237 mL Oral BID BM  . feeding supplement  1 Container Oral TID BM  . metoprolol tartrate  12.5 mg Oral BID  . pantoprazole  40 mg Oral BID  . potassium chloride  40 mEq Oral Once  . QUEtiapine  200 mg Oral Q24H  . sodium chloride  1,000 mL Intravenous Once  . sodium chloride  3 mL Intravenous Q12H  . tamsulosin  0.4 mg Oral Daily   Continuous Infusions:    Principal Problem:   Acute on chronic renal failure Active Problems:   Atrial fibrillation   Anemia   Lactic acidosis   Leukocytosis   H/O: CVA (cerebrovascular accident)   Depression   Diabetes   HTN (hypertension)   FTT (failure to thrive) in adult   Dehydration   Stromal cell tumor   Hypokalemia   Leukocytosis, unspecified   Hypoglycemia   Dysphagia, unspecified    Time spent: > 30 MINS    [facility]  [facility] Pager [phone]. If 7PM-7AM, please contact night-coverage at [email or web address], password [facility] 05/02/2012, 10:55 AM  LOS: 3 days

## 2012-05-02 NOTE — Consult Note (Signed)
Referring Provider: Dr. Ramiro Harvest ([facility]) Primary Care Physician:  [name], MD Primary Gastroenterologist:  [facility]  Reason for Consultation:  Dysphagia  This is a preliminary note. The patient is currently asleep and I did not want to awaken him. The history is based on discussion with the referring physician and review of the medical record. The patient will be interviewed tomorrow and examined at that time as well.   HPI: [name] is a 72 y.o. 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 He was readmitted because of failure to thrive and dehydration. As his condition has improved, he has indicated that he is having trouble eating, because food "sticks" and there is some regurgitation of solid food. He describes having to "wash food down."  Endoscopic evaluation by Dr. Wandalee Ferdinand on February 19, performed because of melena and severe anemia (before the diagnosis of GIST tumor was made based on CT findings and subsequent ultrasound-guided biopsy of the left upper quadrant as), was completely normal, with no mention of any esophageal stricture.  Bedside evaluation by speech and language pathology suggests that there is no evidence of aspiration, and there is suspicion of esophageal dysphagia. It was, however, thought that there may be some signs and symptoms of penetration so his diet was downgraded to dysphagia 3 with thin liquids.  A barium swallow with concurrent upper GI series is pending at present.   Past Medical History  Diagnosis Date  . Coronary artery disease   . Hypertension   . Depression   . Diabetes mellitus without complication   . Stroke 72 years old  . GERD (gastroesophageal reflux disease)     hx of  . Arthritis     hx of  . Erectile dysfunction   . Atrial fibrillation     Past Surgical History  Procedure Laterality Date  .  Coronary artery bypass graft    . Eye surgery      cataracts bilateral  . Carpal tunnel release      bilateral  . Tonsillectomy    . Penile prosthesis implant  03/15/2012    Procedure: PENILE PROTHESIS INFLATABLE;  Surgeon: Lindaann Slough, MD;  Location: [facility] ORS;  Service: Urology;  Laterality: N/A;  . Circumcision  03/15/2012    Procedure: CIRCUMCISION ADULT;  Surgeon: Lindaann Slough, MD;  Location: [facility] ORS;  Service: Urology;  Laterality: N/A;  . Esophagogastroduodenoscopy N/A 04/06/2012    Procedure: ESOPHAGOGASTRODUODENOSCOPY (EGD);  Surgeon: Graylin Shiver, MD;  Location: [facility] ENDOSCOPY;  Service: Endoscopy;  Laterality: N/A;  . Colonoscopy N/A 04/14/2012    Procedure: COLONOSCOPY;  Surgeon: Barrie Folk, MD;  Location: [facility] ENDOSCOPY;  Service: Endoscopy;  Laterality: N/A;  . Gastric resection N/A 04/15/2012    Procedure: Partial Gastrectomy;  Surgeon: Cherylynn Ridges, MD;  Location: [facility] OR;  Service: General;  Laterality: N/A;    Prior to Admission medications   Medication Sig Start Date End Date Taking? Authorizing Provider  amLODipine (NORVASC) 10 MG tablet Take 1 tablet (10 mg total) by mouth daily. 04/23/12  Yes Srikar Cherlynn Kaiser, MD  atorvastatin (LIPITOR) 40 MG tablet Take 40 mg by mouth daily.   Yes Historical Provider, MD  Cholecalciferol (VITAMIN D-3) 5000 UNITS TABS Take 5,000 Units by mouth daily.   Yes Historical Provider, MD  clopidogrel (PLAVIX) 75 MG tablet Take 75 mg by mouth daily.   Yes  Historical Provider, MD  feeding supplement (RESOURCE BREEZE) LIQD Take 1 Container by mouth 3 (three) times daily between meals. 04/23/12  Yes Srikar Cherlynn Kaiser, MD  glimepiride (AMARYL) 4 MG tablet Take 4 mg by mouth daily before breakfast.   Yes Historical Provider, MD  metFORMIN (GLUCOPHAGE) 1000 MG tablet Take 1,000 mg by mouth 2 (two) times daily with a meal.   Yes Historical Provider, MD  metoprolol tartrate (LOPRESSOR) 25 MG tablet Take 12.5 mg by mouth 2 (two) times daily.   Yes Historical  Provider, MD  oxyCODONE-acetaminophen (PERCOCET/ROXICET) 5-325 MG per tablet Take 1-2 tablets by mouth every 6 (six) hours as needed. 04/23/12  Yes Srikar Cherlynn Kaiser, MD  pantoprazole (PROTONIX) 40 MG tablet Take 1 tablet (40 mg total) by mouth 2 (two) times daily. 04/23/12  Yes Srikar Cherlynn Kaiser, MD  polyethylene glycol (MIRALAX / GLYCOLAX) packet Take 17 g by mouth daily as needed (He needs 1-2 smooth BM per day.). 04/23/12  Yes Srikar Cherlynn Kaiser, MD  potassium chloride SA (K-DUR,KLOR-CON) 20 MEQ tablet Take 1 tablet (20 mEq total) by mouth daily. 04/23/12  Yes Cristal Ford, MD  QUEtiapine (SEROQUEL) 200 MG tablet Take 1 tablet (200 mg total) by mouth daily at 8 pm. 04/23/12  Yes Cristal Ford, MD  Tamsulosin HCl (FLOMAX) 0.4 MG CAPS Take 0.4 mg by mouth daily.   Yes Historical Provider, MD    Current Facility-Administered Medications  Medication Dose Route Frequency Provider Last Rate Last Dose  . amLODipine (NORVASC) tablet 10 mg  10 mg Oral Daily Tarry Kos, MD   10 mg at 05/02/12 1128  . atorvastatin (LIPITOR) tablet 40 mg  40 mg Oral Daily Tarry Kos, MD   40 mg at 05/02/12 1128  . clopidogrel (PLAVIX) tablet 75 mg  75 mg Oral Q breakfast Tarry Kos, MD   75 mg at 05/02/12 0917  . enoxaparin (LOVENOX) injection 40 mg  40 mg Subcutaneous Q24H Rodolph Bong, MD   40 mg at 05/02/12 1128  . feeding supplement (ENSURE COMPLETE) liquid 237 mL  237 mL Oral BID BM Jeoffrey Massed, RD   237 mL at 05/02/12 1400  . feeding supplement (RESOURCE BREEZE) liquid 1 Container  1 Container Oral TID BM Tarry Kos, MD   1 Container at 05/02/12 2000  . metoprolol tartrate (LOPRESSOR) tablet 12.5 mg  12.5 mg Oral BID Tarry Kos, MD   12.5 mg at 05/02/12 2039  . ondansetron (ZOFRAN) tablet 4 mg  4 mg Oral Q6H PRN Tarry Kos, MD       Or  . ondansetron [facility]) injection 4 mg  4 mg Intravenous Q6H PRN Tarry Kos, MD   4 mg at 05/01/12 2226  . pantoprazole (PROTONIX) EC tablet 40 mg  40 mg Oral BID Tarry Kos,  MD   40 mg at 05/02/12 2040  . polyethylene glycol (MIRALAX / GLYCOLAX) packet 17 g  17 g Oral Daily PRN Tarry Kos, MD      . QUEtiapine (SEROQUEL XR) 24 hr tablet 200 mg  200 mg Oral Q24H Rodolph Bong, MD   200 mg at 05/02/12 2039  . sodium chloride 0.9 % bolus 1,000 mL  1,000 mL Intravenous Once Vida Roller, MD      . sodium chloride 0.9 % injection 3 mL  3 mL Intravenous Q12H Tarry Kos, MD   3 mL at 05/02/12 2040  . tamsulosin (FLOMAX) capsule 0.4 mg  0.4 mg Oral Daily Tarry Kos,  MD   0.4 mg at 05/02/12 1127    Allergies as of 04/29/2012  . (No Known Allergies)    No family history on file.  History   Social History  . Marital Status: Divorced    Spouse Name: N/A    Number of Children: N/A  . Years of Education: N/A   Occupational History  . Not on file.   Social History Main Topics  . Smoking status: Never Smoker   . Smokeless tobacco: Never Used  . Alcohol Use: No  . Drug Use: No  . Sexually Active: Not on file   Other Topics Concern  . Not on file   Social History Narrative  . No narrative on file       Physical Exam: Vital signs in last 24 hours: Temp:  [98.2 F (36.8 C)-98.4 F (36.9 C)] 98.3 F (36.8 C) (03/17 2031) Pulse Rate:  [73-77] 77 (03/17 2031) Resp:  [16-18] 16 (03/17 2031) BP: (115-141)/(52-58) 129/54 mmHg (03/17 2031) SpO2:  [98 %-100 %] 98 % (03/17 2031) Last BM Date: 04/30/12    Intake/Output from previous day: 03/16 0701 - 03/17 0700 In: 5962.1 [P.O.:240; I.V.:5722.1] Out: 1850 [Urine:1850] Intake/Output this shift: Total I/O In: -  Out: 250 [Urine:250]  Lab Results:  Recent Labs  04/30/12 0730 05/01/12 0500 05/02/12 0430  WBC 11.7* 11.3* 10.8*  HGB 9.1* 9.1* 8.9*  HCT 28.7* 29.0* 27.7*  PLT 521* 545* 441*   BMET  Recent Labs  04/30/12 0730 05/01/12 0500 05/02/12 0430  NA 138 139 136  K 2.7* 4.2 3.4*  CL 102 107 104  CO2 24 24 23   GLUCOSE 32* 154* 186*  BUN 16 8 4*  CREATININE 1.21 1.05  0.86  CALCIUM 7.9* 7.8* 7.8*   LFT  Recent Labs  04/30/12 0730  PROT 6.1  ALBUMIN 2.5*  AST 11  ALT <5  ALKPHOS 86  BILITOT 0.2*   PT/INR No results found for this basename: LABPROT, INR,  in the last 72 hours   Studies/Results: No results found.  Impression: Nonspecific dysphagia. Clearly, a high-grade esophageal stenosis is unlikely with a negative endoscopy in the past month. It may be that this is an esophageal motility problem or a mild esophageal ring which is not always endoscopically apparent  Plan: Await results of upper GI series   LOS: 3 days   Lucetta Baehr V  05/02/2012, 9:46 PM

## 2012-05-02 NOTE — Progress Notes (Signed)
Speech Language Pathology Dysphagia Treatment Patient Details Name: [name] MRN: [id number] DOB: 1940/10/01 Today's Date: 05/02/2012 Time: 4098-1191 SLP Time Calculation (min): 20 min  Assessment / Plan / Recommendation Clinical Impression  Patient appears to be tolerating Dysphagia 3 diet with thin liquids.  Patient took sips of water via straw and chewed and swallowed a cracker without overt s/s of aspiration.  Patient's voice is dysphonic intermittently.  With effort and verbal cues, pt. can obtain phonation, however, it is not sustained.    Diet Recommendation  Continue with Current Diet: Dysphagia 3 (mechanical soft);Thin liquid    SLP Plan Continue with current plan of care   Pertinent Vitals/Pain n/a   Swallowing Goals  SLP Swallowing Goals Patient will consume recommended diet without observed clinical signs of aspiration with: Supervision/safety Swallow Study Goal #1 - Progress: Partly Met  General Temperature Spikes Noted: No Respiratory Status: Room air Behavior/Cognition: Alert;Cooperative;Requires cueing;Decreased sustained attention Oral Cavity - Dentition: Adequate natural dentition Patient Positioning: Upright in chair  Oral Cavity - Oral Hygiene Does patient have any of the following "at risk" factors?: None of the above Brush patient's teeth BID with toothbrush (using toothpaste with fluoride): Yes Patient is AT RISK - Oral Care Protocol followed (see row info): Yes   Dysphagia Treatment Treatment focused on: Skilled observation of diet tolerance;Patient/family/caregiver education Treatment Methods/Modalities: Skilled observation Patient observed directly with PO's: Yes Type of PO's observed: Dysphagia 3 (soft);Thin liquids Feeding: Able to feed self;Needs set up;Needs assist Liquids provided via: Cup;Straw Oral Phase Signs & Symptoms: Prolonged mastication;Prolonged bolus formation (Munching type pattern) Type of cueing: Verbal Amount of cueing:  Minimal   GO     Maryjo Rochester T 05/02/2012, 3:44 PM

## 2012-05-02 NOTE — Evaluation (Signed)
Physical Therapy Evaluation Patient Details Name: [name] MRN: [id number] DOB: 1940/03/17 Today's Date: 05/02/2012 Time: 2130-8657 PT Time Calculation (min): 8 min  PT Assessment / Plan / Recommendation Clinical Impression  Pt presents with acute on chronic renal failure with recent dx/resection of GIST tumor.  Tolerated OOB and ambulation to/from restroom at min/guard to min assist for safety, however requires max encouragement to participate with therapy and note that pt has very soft voice tone.  SLP in room also with encouragement to speak louder.  Pt will benefit from skilled PT in acute venue to address deficits.  PT recommends ST SNF for follow up, however feel that pt will refuse, therefore recommend HHPT with 24/7 supervision/assist.     PT Assessment  Patient needs continued PT services    Follow Up Recommendations  Home health PT;SNF;Supervision/Assistance - 24 hour    Does the patient have the potential to tolerate intense rehabilitation      Barriers to Discharge None States will have mostly 24/7 assist at home.     Equipment Recommendations  None recommended by PT    Recommendations for Other Services     Frequency Min 3X/week    Precautions / Restrictions Precautions Precautions: Fall Restrictions Weight Bearing Restrictions: No   Pertinent Vitals/Pain No pain, states uncomfortable while in chair during speech eval.       Mobility  Bed Mobility Bed Mobility: Supine to Sit;Sit to Supine Right Sidelying to Sit: 6: Modified independent (Device/Increase time);With rails;HOB flat Supine to Sit: 6: Modified independent (Device/Increase time);With rails (LE) Sit to Supine: 4: Min assist;HOB flat;With rail ((A) with LB) Details for Bed Mobility Assistance: Pt requires max encouragement to participate in tasks, however able to do at mod I level, min assist for trunk with getting back into bed.  Transfers Sit to Stand: 4: Min guard;With upper extremity  assist;From bed Stand to Sit: 4: Min guard;With upper extremity assist;To bed Details for Transfer Assistance: Cues for hand placement and safety.  Ambulation/Gait Ambulation/Gait Assistance: 4: Min assist;4: Min guard Ambulation Distance (Feet): 20 Feet (x 2) Assistive device: Rolling walker Ambulation/Gait Assistance Details: cues for maintaining position inside of RW with amb and to maintain upright posture.   Gait Pattern: Step-through pattern;Decreased stride length Gait velocity: decreased Stairs: No    Exercises     PT Diagnosis: Difficulty walking;Generalized weakness  PT Problem List: Decreased strength;Decreased activity tolerance;Decreased balance;Decreased mobility;Decreased knowledge of use of DME PT Treatment Interventions: DME instruction;Gait training;Stair training;Functional mobility training;Therapeutic activities;Therapeutic exercise;Balance training;Patient/family education   PT Goals Acute Rehab PT Goals PT Goal Formulation: With patient Time For Goal Achievement: 05/09/12 Potential to Achieve Goals: Good Pt will go Sit to Stand: with modified independence PT Goal: Sit to Stand - Progress: Goal set today Pt will Ambulate: >150 feet;with modified independence;with least restrictive assistive device PT Goal: Ambulate - Progress: Goal set today Pt will Go Up / Down Stairs: 6-9 stairs;with rail(s);with modified independence PT Goal: Up/Down Stairs - Progress: Goal set today Pt will Perform Home Exercise Program: with supervision, verbal cues required/provided PT Goal: Perform Home Exercise Program - Progress: Goal set today  Visit Information  Last PT Received On: 05/02/12 Assistance Needed: +1    Subjective Data  Subjective: I can't talk loud b/c of that tube they had down my throat.  Patient Stated Goal: to return home with friend.    Prior Functioning  Home Living Lives With: Other (Comment);Friend(s) (really close friend) Available Help at Discharge:  Available PRN/intermittently  Type of Home: House Home Access: Stairs to enter [employer] of Steps: 4-5 Entrance Stairs-Rails: Right;Left Home Layout: One level Bathroom Shower/Tub: Tub/shower unit;Curtain Firefighter: Standard Home Adaptive Equipment: Walker - rolling Prior Function Level of Independence: Independent with assistive device(s) Able to Take Stairs?: Yes Driving: Yes Vocation: Retired Musician: No difficulties;Other (comment) (very soft voice tone) Dominant Hand: Right    Cognition  Cognition Overall Cognitive Status: Appears within functional limits for tasks assessed/performed Arousal/Alertness: Awake/alert Orientation Level: Appears intact for tasks assessed Behavior During Session: [facility] for tasks performed    Extremity/Trunk Assessment Right Upper Extremity Assessment RUE ROM/Strength/Tone: WFL for tasks assessed RUE Coordination: WFL - gross/fine motor (opening soap top by flipping open and closing with pincher ) Left Upper Extremity Assessment LUE ROM/Strength/Tone: WFL for tasks assessed LUE Coordination: WFL - gross motor Right Lower Extremity Assessment RLE ROM/Strength/Tone: Deficits RLE ROM/Strength/Tone Deficits: Noted some muscle shaking with testing, however WFL during functional tasks.  RLE Sensation: WFL - Light Touch RLE Coordination: WFL - gross/fine motor Left Lower Extremity Assessment LLE ROM/Strength/Tone: Deficits LLE ROM/Strength/Tone Deficits: noted some muscle shaking with testing, however WFL during functional tasks.  LLE Sensation: WFL - Light Touch LLE Coordination: WFL - gross/fine motor Trunk Assessment Trunk Assessment: Normal   Balance Balance Balance Assessed: Yes Dynamic Sitting Balance Dynamic Sitting - Balance Support: Bilateral upper extremity supported;Feet supported Dynamic Sitting - Level of Assistance: 6: Modified independent (Device/Increase time) Static Standing Balance Static  Standing - Balance Support: Right upper extremity supported;During functional activity Static Standing - Level of Assistance: 6: Modified independent (Device/Increase time) Static Standing - Comment/# of Minutes: Pt able to stand and use restroom holding onto rail.   End of Session PT - End of Session Activity Tolerance: Patient limited by fatigue Patient left: in bed;with call bell/phone within reach Nurse Communication: Mobility status  GP     Vista Deck 05/02/2012, 4:04 PM

## 2012-05-03 ENCOUNTER — Encounter (INDEPENDENT_AMBULATORY_CARE_PROVIDER_SITE_OTHER): Payer: Medicare Other | Admitting: General Surgery

## 2012-05-03 ENCOUNTER — Inpatient Hospital Stay (HOSPITAL_COMMUNITY): Payer: Medicare Other

## 2012-05-03 LAB — GLUCOSE, CAPILLARY
Glucose-Capillary: 154 mg/dL — ABNORMAL HIGH (ref 70–99)
Glucose-Capillary: 227 mg/dL — ABNORMAL HIGH (ref 70–99)
Glucose-Capillary: 258 mg/dL — ABNORMAL HIGH (ref 70–99)

## 2012-05-03 LAB — CBC
MCH: 28.5 pg (ref 26.0–34.0)
Platelets: 475 10*3/uL — ABNORMAL HIGH (ref 150–400)
RBC: 3.26 MIL/uL — ABNORMAL LOW (ref 4.22–5.81)

## 2012-05-03 LAB — BASIC METABOLIC PANEL
Calcium: 8.4 mg/dL (ref 8.4–10.5)
GFR calc non Af Amer: 88 mL/min — ABNORMAL LOW (ref >90)
Glucose, Bld: 182 mg/dL — ABNORMAL HIGH (ref 70–99)
Sodium: 140 mEq/L (ref 135–145)

## 2012-05-03 MED ORDER — IOHEXOL 300 MG/ML  SOLN
50.0000 mL | Freq: Once | INTRAMUSCULAR | Status: AC | PRN
  Administered 2012-05-03: 50 mL via ORAL

## 2012-05-03 MED ORDER — POTASSIUM CHLORIDE CRYS ER 20 MEQ PO TBCR
40.0000 meq | EXTENDED_RELEASE_TABLET | Freq: Once | ORAL | Status: AC
  Administered 2012-05-03: 40 meq via ORAL
  Filled 2012-05-03: qty 2

## 2012-05-03 MED ORDER — ADULT MULTIVITAMIN W/MINERALS CH
1.0000 | ORAL_TABLET | Freq: Every day | ORAL | Status: DC
  Administered 2012-05-03 – 2012-05-06 (×4): 1 via ORAL
  Filled 2012-05-03 (×4): qty 1

## 2012-05-03 NOTE — Progress Notes (Signed)
General Surgery [facility]  Patient discussed with Dr. Marjorie Smolder and Dr. Lindie Spruce.  If patient can maintain himself on liquid diet, then no role for acute surgical intervention.  Dr. Marjorie Smolder and Dr. Lindie Spruce to discuss EGD procedure originally scheduled for tomorrow.  Will follow.  Velora Heckler, MD, [facility] Office: [phone]

## 2012-05-03 NOTE — Progress Notes (Signed)
   CARE MANAGEMENT NOTE 05/03/2012  Patient:  [name], [name]   Account Number:  [id number]  Date Initiated:  05/03/2012  Documentation initiated by:  Jiles Crocker  Subjective/Objective Assessment:   ADMITTED WITH LETHARGY, ARF, ANEMIA     Action/Plan:   PCP: [name], MD  LIVES ALONE, IS ACTIVE WITH [facility]- HHRN/ PT   Anticipated DC Date:  05/06/2012   Anticipated DC Plan:  HOME W HOME HEALTH SERVICES      DC Planning Services  CM consult       Status of service:  In process, will continue to follow Medicare Important Message given?  NA - LOS <3 / Initial given by admissions (If response is "NO", the following Medicare IM given date fields will be blank) Per UR Regulation:  Reviewed for med. necessity/level of care/duration of stay Comments:  05/02/2012- B Gudrun Axe RN,BSN,MHA

## 2012-05-03 NOTE — Progress Notes (Signed)
[facility] PROGRESS NOTE  [name] EXB:[id number] DOB: [birth year] DOA: 04/29/2012 PCP: [name], MD  Assessment/Plan:  #1 acute renal failure Likely secondary to prerenal azotemia secondary to volume depletion. Renal function improved with IV fluids and currently resolved. Saline lock IV fluids and follow.  #2 hypokalemia Probably likely secondary to GI losses. Replete. Keep magnesium greater than 2.  #3 leukocytosis Questionable etiology. Chest x-ray is negative. Urinalysis is negative. Lactic acidosis on admission was elevated however patient was also severely dehydrated, and lactic acid trended down. Patient is currently afebrile. WBC trending down. Will hold off on antibiotics at this time.  #4 anemia of chronic disease. Likely secondary to anemia of chronic disease. Patient with no overt GI bleed. May have a dilutional component. Anemia panel consistent with anemia of chronic disease. Follow H&H.  #5 diabetes mellitus type 2/hypoglycemia Patient with CBG 143-198. Continue on a regular diet, pending upper GI series. Hold oral hypoglycemic agents. Check CBGs ac and hs.  #6. Dysphagia Barium swallow and UGI series pending as patient with recent partial gastrectomy. GI consulted and awaiting recommendations pending studies.  #7 hypertension Stable. Continue Norvasc and metoprolol and Flomax.  #8 dehydration IV fluids.  #9 history of atrial fibrillation Continue metoprolol for rate control. Plavix for anticoagulation.  #10 history of CVA Stable. Continue Plavix.  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  #12 prophylaxis PPI for GI prophylaxis. Lovenox for DVT prophylaxis.  Code Status: Full Family Communication: Updated patient no family at bedside. Disposition Plan: Home when medically stable   Consultants:  GI Dr Matthias Hughs 05/02/12  Procedures:  chest x-ray 04/29/2012 CT head 04/29/2012 Barium  swallow  UGI series 05/03/2012  Antibiotics:  None  HPI/Subjective: Patient sleeping but easily arousable. Patient c/o weakness. Patient states he feels the same as on presentation. Patient c/o food getting stuck when he swallows and only able to eat small portions.  Objective: Filed Vitals:   05/02/12 0417 05/02/12 1356 05/02/12 2031 05/03/12 0510  BP: 141/58 115/52 129/54 124/59  Pulse: 73 74 77 78  Temp: 98.4 F (36.9 C) 98.2 F (36.8 C) 98.3 F (36.8 C) 98.2 F (36.8 C)  TempSrc: Oral Oral Oral Oral  Resp: 18 18 16 16   Height:      Weight:      SpO2: 100% 100% 98% 99%    Intake/Output Summary (Last 24 hours) at 05/03/12 1110 Last data filed at 05/03/12 0200  Gross per 24 hour  Intake    160 ml  Output    600 ml  Net   -440 ml   Filed Weights   04/30/12 0057  Weight: 71.2 kg (156 lb 15.5 oz)    Exam:   General:  NAD. SLEEPING BUT EASILY AROUSABLE  Cardiovascular: RRR  Respiratory: CTAB anterior lung fields  Abdomen: Soft/NT/ND/+BS  EXTREMITIES: No c/c/e  Data Reviewed: Basic Metabolic Panel:  Recent Labs Lab 04/29/12 1742 04/30/12 0730 05/01/12 0500 05/02/12 0430 05/03/12 0442  NA 140 138 139 136 140  K 3.5 2.7* 4.2 3.4* 3.3*  CL 104 102 107 104 106  CO2  --  24 24 23 26   GLUCOSE 78 32* 154* 186* 182*  BUN 20 16 8  4* 5*  CREATININE 1.70* 1.21 1.05 0.86 0.82  CALCIUM  --  7.9* 7.8* 7.8* 8.4  MG  --  1.6 1.9  --   --    Liver Function Tests:  Recent Labs Lab 04/29/12 1715 04/30/12  0730  AST 12 11  ALT 6 <5  ALKPHOS 109 86  BILITOT 0.3 0.2*  PROT 7.0 6.1  ALBUMIN 2.8* 2.5*   No results found for this basename: LIPASE, AMYLASE,  in the last 168 hours No results found for this basename: AMMONIA,  in the last 168 hours CBC:  Recent Labs Lab 04/29/12 1715 04/29/12 1742 04/30/12 0730 05/01/12 0500 05/02/12 0430 05/03/12 0442  WBC 12.5*  --  11.7* 11.3* 10.8* 10.2  NEUTROABS 9.9*  --   --   --   --   --   HGB 10.5* 13.3 9.1*  9.1* 8.9* 9.3*  HCT 33.2* 39.0 28.7* 29.0* 27.7* 28.6*  MCV 88.8  --  87.8 88.7 88.5 87.7  PLT 582*  --  521* 545* 441* 475*   Cardiac Enzymes:  Recent Labs Lab 04/30/12 0105 04/30/12 0730 04/30/12 1315  TROPONINI <0.30 <0.30 <0.30   BNP (last 3 results) No results found for this basename: PROBNP,  in the last 8760 hours CBG:  Recent Labs Lab 05/02/12 0803 05/02/12 1201 05/02/12 1547 05/02/12 2030 05/03/12 0731  GLUCAP 143* 198* 191* 172* 157*    No results found for this or any previous visit (from the past 240 hour(s)).   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 Now with pain and vomiting and difficulty eating  ESOPHOGRAM/BARIUM SWALLOW  Technique:  Single contrast examination was performed using Omnipaque followed by thin barium.  Fluoroscopy time:  5.3 minutes.  Comparison:  CT 04/06/2012  Findings:  Preliminary KUB reveals nonobstructive bowel gas pattern.  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  No esophageal stricture or hiatal hernia was identified.  There has been resection of a large tumor arising from the greater curve of the stomach.  The body of the stomach is filled with barium.  There is a narrow channel    between the gastric body and gastric antrum.  This was studied in some detail in  multiple positions and appears to be a persistent area of narrowing.  The gastric antrum is normal.  The pylorus and duodenal bulb are normal.  There is contrast in the proximal small bowel which is nondilated.  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  This appears to be a significant stricture accounting for the patient's current symptoms.  I discussed the findings with Dr. Lindie Spruce and Dr. Matthias Hughs.   Original Report Authenticated By: Janeece Riggers, [name]     Scheduled Meds: . amLODipine  10 mg Oral Daily  .  atorvastatin  40 mg Oral Daily  . clopidogrel  75 mg Oral Q breakfast  . enoxaparin (LOVENOX) injection  40 mg Subcutaneous Q24H  . feeding supplement  237 mL Oral BID BM  . feeding supplement  1 Container Oral TID BM  . metoprolol tartrate  12.5 mg Oral BID  . pantoprazole  40 mg Oral BID  . potassium chloride  40 mEq Oral Once  . QUEtiapine  200 mg Oral Q24H  . sodium chloride  1,000 mL Intravenous Once  . sodium chloride  3 mL Intravenous Q12H  . tamsulosin  0.4 mg Oral Daily   Continuous Infusions:    Principal Problem:   Acute on chronic renal failure Active Problems:   Atrial fibrillation   Anemia   Lactic acidosis   Leukocytosis   H/O: CVA (cerebrovascular accident)   Depression  Diabetes   HTN (hypertension)   FTT (failure to thrive) in adult   Dehydration   Stromal cell tumor   Hypokalemia   Leukocytosis, unspecified   Hypoglycemia   Dysphagia, unspecified    Time spent: > 30 MINS    [facility]  [facility] Pager [phone]. If 7PM-7AM, please contact night-coverage at [email or web address], password [facility] 05/03/2012, 11:10 AM  LOS: 4 days

## 2012-05-03 NOTE — Progress Notes (Signed)
CSW reviewed PT evaluation noting recommendation of home health vs. SNF at discharge. CSW met with patient who states that he plans to return home, that he has "numerous people" to help him at home. Patient also informed CSW that he is already set up with [place]. RNCM, Steward Drone aware. CSW signing off.   Unice Bailey, LCSW [facility] Clinical Social Worker cell #: [phone]

## 2012-05-03 NOTE — Progress Notes (Signed)
Physical Therapy Treatment Patient Details Name: [name] MRN: [id number] DOB: [birth year] Today's Date: 05/03/2012 Time: 1415-1440 PT Time Calculation (min): 25 min  PT Assessment / Plan / Recommendation Comments on Treatment Session  Pt requires MAX encouragement to increase self assist.  Slow/sluggish with poor effort.      Follow Up Recommendations  Home health PT;SNF;Supervision/Assistance - 24 hour     Does the patient have the potential to tolerate intense rehabilitation     Barriers to Discharge        Equipment Recommendations  None recommended by PT    Recommendations for Other Services    Frequency Min 3X/week   Plan Discharge plan needs to be updated    Precautions / Restrictions     Pertinent Vitals/Pain C/o ABD pain during amb did not rate    Mobility  Bed Mobility Bed Mobility: Supine to Sit Right Sidelying to Sit: 6: Modified independent (Device/Increase time);With rails;HOB flat Details for Bed Mobility Assistance: Pt requires max encouragement to participate in tasks.  Requires increased time. Transfers Transfers: Sit to Stand;Stand to Sit Sit to Stand: 4: Min guard;With upper extremity assist;From bed Stand to Sit: 4: Min guard;With upper extremity assist;To bed Details for Transfer Assistance: 75% VC's on proper tech and hand placement esp with stand to sit. Ambulation/Gait Ambulation/Gait Assistance: 4: Min assist;4: Min guard Ambulation Distance (Feet): 380 Feet (with one sitting rest break) Assistive device: Rolling walker Ambulation/Gait Assistance Details: 50% VC's on proper walker to self distance and proper  upright posture. Gait Pattern: Step-through pattern;Decreased stride length;Shuffle Gait velocity: decreased     PT Goals                                                   Progressing slowly    Visit Information  Last PT Received On: 05/03/12    Subjective Data      Cognition       Balance   fair  End of Session PT -  End of Session Equipment Utilized During Treatment: Gait belt Activity Tolerance: Patient limited by fatigue Patient left: in chair;with call bell/phone within reach Nurse Communication: Mobility status   Felecia Shelling  PTA [facility]  Acute  Rehab Pager      [phone]

## 2012-05-03 NOTE — Progress Notes (Signed)
Nutrition Brief Note  RD consulted to assess if pt will get enough calories and protein on full liquid diet  Estimated Nutritional Needs:  Kcal: 1800-2000  Protein: 75-85 gm  Fluid: 1.8L  Diet: Standard full liquid diet provides approximately 1564 kcal and 56 grams of protein daily Supplements between meals: Ensure BID and Resource Breeze TID will provide 1200 kcal and 44 grams of protein.  Diet and Supplements combined will provide 2764 kcal and 100 grams of protein which will exceed pt's calorie and protein needs.  Pt at this time is drinking two [employer] daily and 1 Ensure daily; in combination with full liquid diet, this alone will meet pt's needs to sustain him. Will add Multivitamin with minerals to ensure nutrient needs are met. Discussed options on full liquid diet with pt and encouraged pt to drink at least 2-3 supplements daily. Expect good compliance.   No further nutrition interventions warranted at this time. Please re-consult dietitian as needed.  Ian Malkin RD, LDN Inpatient Clinical Dietitian Pager: [phone] After Hours Pager: [phone]

## 2012-05-03 NOTE — Progress Notes (Signed)
This note is to complete my initial consultation from last night.  History obtained from the patient indicates that, since the time of his surgery, he has had food intolerance, where food seems to "stock" in the epigastric area and sometimes become regurgitated. There is a past history of reflux but he is only having minimal reflux symptoms at this time. I do not obtain a history that suggests that food actually sticks in the esophagus itself; this does not sound like esophageal dysphagia.  The patient indicates he has lost a large amount of weight since his surgery because of this symptom.  The patient has a very weak, hoarse voice, which he indicates has been a problem since his surgery, possibly related to prolonged naso- gastric intubation.  Physical exam:  A still fairly well-developed [place] American male with significant voice hoarseness. Anicteric, no pallor. Chest clear, heart normal. Abdomen without succussion splash, mass effect, or tenderness. There is still a dressing over his abdominal incision.  Upper GI series: Shows an hourglass narrowing of the mid stomach, with an aperture of only about 1 cm, per discussion with the radiologist. The stomach was slowed empty, but emptied best with the patient either upright or, even better, in the right lateral decubitus position. These findings were reviewed with the patient.  Impression: The patient's symptoms are probably related to impaired gastric emptying due to his midgastric stricture.  Recommendation:   1. I would favor a full liquid diet, but the patient is not agreeable to this. In the meantime, I have at least recommended the patient try to avoid vegetable matter which would probably be the hardest to pass through the narrowed area.  2.  We should consider endoscopic evaluation and the patient is scheduled for that procedure tomorrow. However, I would like to have the surgeons input regarding the safety of that procedure, since it is  slightly less than 3 weeks since his operation. My understanding is that the surgeons will be seeing the patient today, since Dr. Lindie Spruce was contacted by the radiologist with the above-mentioned x-ray findings, and Dr. Lindie Spruce indicated he would contact the surgeon of the week for this hospital.  Florencia Reasons, [name] [phone]

## 2012-05-03 NOTE — Progress Notes (Signed)
Subjective: Pt tired, doesn't really want to walk, PT is here to help him.  Feels tired and worn out.  Objective: Vital signs in last 24 hours: Temp:  [98.2 F (36.8 C)-98.4 F (36.9 C)] 98.4 F (36.9 C) (03/18 1300) Pulse Rate:  [74-81] 81 (03/18 1300) Resp:  [16-18] 16 (03/18 1300) BP: (115-149)/(47-59) 149/47 mmHg (03/18 1300) SpO2:  [98 %-100 %] 100 % (03/18 1300) Last BM Date: 04/30/12 Diet: regular, afebrile, VSS, K+ is still low, UGI/Ba Swallow: focal stricture of the distal body of the stomach in the area of the partial gastrectomy, which is significant.  Intake/Output from previous day: 03/17 0701 - 03/18 0700 In: 280 [P.O.:280] Out: 1300 [Urine:1300] Intake/Output this shift:    General appearance: alert, cooperative, no distress and fatigued and tired. GI: soft, non-tender; bowel sounds normal; no masses,  no organomegaly and he has a large dressing but the wound is healed and he looks fine.  There was one spot of drainage on ABD, pad, but I cant tell where it is from.  Lab Results:   Recent Labs  05/02/12 0430 05/03/12 0442  WBC 10.8* 10.2  HGB 8.9* 9.3*  HCT 27.7* 28.6*  PLT 441* 475*    BMET  Recent Labs  05/02/12 0430 05/03/12 0442  NA 136 140  K 3.4* 3.3*  CL 104 106  CO2 23 26  GLUCOSE 186* 182*  BUN 4* 5*  CREATININE 0.86 0.82  CALCIUM 7.8* 8.4   PT/INR No results found for this basename: LABPROT, INR,  in the last 72 hours   Recent Labs Lab 04/29/12 1715 04/30/12 0730  AST 12 11  ALT 6 <5  ALKPHOS 109 86  BILITOT 0.3 0.2*  PROT 7.0 6.1  ALBUMIN 2.8* 2.5*     Lipase  No results found for this basename: lipase     Studies/Results: Dg Ugi W/kub  05/03/2012  *RADIOLOGY REPORT*  Clinical Data: 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 Now with pain and vomiting and difficulty eating  ESOPHOGRAM/BARIUM SWALLOW  Technique:  Single contrast examination was performed using Omnipaque followed by thin barium.   Fluoroscopy time:  5.3 minutes.  Comparison:  CT 04/06/2012  Findings:  Preliminary KUB reveals nonobstructive bowel gas pattern.  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  No esophageal stricture or hiatal hernia was identified.  There has been resection of a large tumor arising from the greater curve of the stomach.  The body of the stomach is filled with barium.  There is a narrow channel    between the gastric body and gastric antrum.  This was studied in some detail in  multiple positions and appears to be a persistent area of narrowing.  The gastric antrum is normal.  The pylorus and duodenal bulb are normal.  There is contrast in the proximal small bowel which is nondilated.  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  This appears to be a significant stricture accounting for the patient's current symptoms.  I discussed the findings with Dr. Lindie Spruce and Dr. Matthias Hughs.   Original Report Authenticated By: Janeece Riggers, [name]     Medications: . amLODipine  10 mg Oral Daily  . atorvastatin  40 mg Oral Daily  . clopidogrel  75 mg Oral Q breakfast  . enoxaparin (LOVENOX) injection  40 mg Subcutaneous Q24H  . feeding supplement  237 mL Oral BID BM  . feeding supplement  1 Container Oral  TID BM  . metoprolol tartrate  12.5 mg Oral BID  . pantoprazole  40 mg Oral BID  . QUEtiapine  200 mg Oral Q24H  . sodium chloride  1,000 mL Intravenous Once  . sodium chloride  3 mL Intravenous Q12H  . 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 Renal insuffiencey with dehydration. Malnutrition/deconditioning Anemia with GI bleed  CAD/Afib/CABG HYPERTENSION  AODM  HX of Stroke,  GERD  Depression  Plan:  I am going to put him on full liquid diet, multiple feedings and  see how he does with PO intake and how he tolerates this.   If he cannot cope with full liquids we may have to consider alternate feeding method, possibly TNA.   Dr. Gerrit Friends and Dr. Lindie Spruce will discuss this problem.  He can shower and do OT too.  LOS: 4 days    Clare Fennimore 05/03/2012

## 2012-05-04 ENCOUNTER — Encounter (HOSPITAL_COMMUNITY)
Admission: EM | Disposition: A | Discharge status: Home Health | Payer: Self-pay | Source: Home / Self Care | Attending: Internal Medicine

## 2012-05-04 DIAGNOSIS — E119 Type 2 diabetes mellitus without complications: Secondary | ICD-10-CM

## 2012-05-04 LAB — MAGNESIUM: Magnesium: 1.3 mg/dL — ABNORMAL LOW (ref 1.5–2.5)

## 2012-05-04 LAB — COMPREHENSIVE METABOLIC PANEL
AST: 8 U/L (ref 0–37)
Albumin: 2.1 g/dL — ABNORMAL LOW (ref 3.5–5.2)
Chloride: 103 mEq/L (ref 96–112)
Creatinine, Ser: 0.82 mg/dL (ref 0.50–1.35)
Total Bilirubin: 0.2 mg/dL — ABNORMAL LOW (ref 0.3–1.2)
Total Protein: 5.5 g/dL — ABNORMAL LOW (ref 6.0–8.3)

## 2012-05-04 LAB — URINALYSIS, ROUTINE W REFLEX MICROSCOPIC
Bilirubin Urine: NEGATIVE
Ketones, ur: NEGATIVE mg/dL
Nitrite: NEGATIVE
Protein, ur: NEGATIVE mg/dL
Specific Gravity, Urine: 1.026 (ref 1.005–1.030)
Urobilinogen, UA: 1 mg/dL (ref 0.0–1.0)

## 2012-05-04 LAB — GLUCOSE, CAPILLARY
Glucose-Capillary: 180 mg/dL — ABNORMAL HIGH (ref 70–99)
Glucose-Capillary: 209 mg/dL — ABNORMAL HIGH (ref 70–99)
Glucose-Capillary: 233 mg/dL — ABNORMAL HIGH (ref 70–99)

## 2012-05-04 LAB — CBC
MCV: 87.2 fL (ref 78.0–100.0)
Platelets: 420 10*3/uL — ABNORMAL HIGH (ref 150–400)
RDW: 14.8 % (ref 11.5–15.5)
WBC: 11.8 10*3/uL — ABNORMAL HIGH (ref 4.0–10.5)

## 2012-05-04 SURGERY — EGD (ESOPHAGOGASTRODUODENOSCOPY)
Anesthesia: Moderate Sedation

## 2012-05-04 MED ORDER — POTASSIUM CHLORIDE CRYS ER 20 MEQ PO TBCR
40.0000 meq | EXTENDED_RELEASE_TABLET | Freq: Once | ORAL | Status: AC
  Administered 2012-05-04: 40 meq via ORAL
  Filled 2012-05-04: qty 2

## 2012-05-04 MED ORDER — INSULIN ASPART 100 UNIT/ML ~~LOC~~ SOLN
2.0000 [IU] | Freq: Once | SUBCUTANEOUS | Status: AC
  Administered 2012-05-04: 2 [IU] via SUBCUTANEOUS

## 2012-05-04 MED ORDER — POTASSIUM CHLORIDE 20 MEQ/15ML (10%) PO LIQD
40.0000 meq | Freq: Once | ORAL | Status: AC
  Administered 2012-05-04: 40 meq via ORAL
  Filled 2012-05-04: qty 30

## 2012-05-04 MED ORDER — POTASSIUM CHLORIDE CRYS ER 20 MEQ PO TBCR
40.0000 meq | EXTENDED_RELEASE_TABLET | Freq: Once | ORAL | Status: DC
  Filled 2012-05-04: qty 2

## 2012-05-04 MED ORDER — ACETAMINOPHEN 325 MG PO TABS
650.0000 mg | ORAL_TABLET | Freq: Four times a day (QID) | ORAL | Status: DC | PRN
  Administered 2012-05-04: 650 mg via ORAL

## 2012-05-04 MED ORDER — MAGNESIUM SULFATE 40 MG/ML IJ SOLN
2.0000 g | Freq: Once | INTRAMUSCULAR | Status: AC
  Administered 2012-05-04: 2 g via INTRAVENOUS
  Filled 2012-05-04: qty 50

## 2012-05-04 MED ORDER — PANTOPRAZOLE SODIUM 40 MG PO PACK
40.0000 mg | PACK | Freq: Two times a day (BID) | ORAL | Status: DC
  Administered 2012-05-04 – 2012-05-06 (×4): 40 mg via ORAL
  Filled 2012-05-04 (×5): qty 20

## 2012-05-04 MED ORDER — METOPROLOL TARTRATE 12.5 MG HALF TABLET
12.5000 mg | ORAL_TABLET | Freq: Two times a day (BID) | ORAL | Status: DC
  Administered 2012-05-04 – 2012-05-06 (×4): 12.5 mg
  Filled 2012-05-04 (×6): qty 1

## 2012-05-04 MED ORDER — ACETAMINOPHEN 160 MG/5ML PO SOLN
650.0000 mg | Freq: Four times a day (QID) | ORAL | Status: DC | PRN
  Administered 2012-05-05: 650 mg via ORAL
  Filled 2012-05-04: qty 20.3

## 2012-05-04 NOTE — Progress Notes (Signed)
OT Cancellation Note  Patient Details Name: [name] MRN: [id number] DOB: Nov 24, 1940   Cancelled Treatment:    Reason Eval/Treat Not Completed: Other (comment) Pt reports he has been up a lot today, walked with PT and sat up in chair.  States he will participate in OT tomorrow.  Will check back.   Briza Bark 05/04/2012, 3:22 PM Marica Otter, OTR/L [phone] 05/04/2012

## 2012-05-04 NOTE — Progress Notes (Signed)
Speech Language Pathology Dysphagia Treatment Patient Details Name: [name] MRN: [id number] DOB: 1940/06/09 Today's Date: 05/04/2012 Time: 4098-1191 SLP Time Calculation (min): 9 min  Assessment / Plan / Recommendation Clinical Impression  SLP reviewed chart and noted that pt had distal stricture at area of partial gastrectomy that is source of his sensation of food stasis.  Currently pt is on a full liquid diet and received input from dietiician to assure intake is adequate.  Vocal strength appeared better today and pt agrees - he contibuted voice deficits to tube placement prior to surgery.   Advised pt to speak to pharmacist or RN re: which medications can be crushed given his distal stricture and diet limitation.   Also advised to generaly esophageal precautions, several small meals as GI/Sx indicated.  Pt denies any problems swallowing solids, only liquids give stasis sensation.     No further SLP indicated as pt 's dysphagia is esophageal.  Pt reported understanding to information provided.      Diet Recommendation  Continue with Current Diet:  (full liquid per GI and sx)    SLP Plan All goals met   Pertinent Vitals/Pain Afebrile, decreased, 50% po   Swallowing Goals  SLP Swallowing Goals Swallow Study Goal #1 - Progress: Met Swallow Study Goal #2 - Progress: Met  General Respiratory Status: Room air Behavior/Cognition: Pleasant mood;Cooperative;Alert (pt sleepy but did awaken to answer questions) Oral Cavity - Dentition: Adequate natural dentition Patient Positioning:  (pt laying in bed, on his left side)  Dysphagia Treatment Treatment focused on: Patient/family/caregiver education Family/Caregiver Educated: pt only Patient observed directly with PO's: No Reason PO's not observed:  (education only) Feeding: Able to feed self   GO     Donavan Burnet, MS [facility] SLP [phone]

## 2012-05-04 NOTE — Progress Notes (Signed)
Physical Therapy Treatment Patient Details Name: [name] MRN: [id number] DOB: Feb 03, 1941 Today's Date: 05/04/2012 Time: 4098-1191 PT Time Calculation (min): 17 min  PT Assessment / Plan / Recommendation Comments on Treatment Session  Pt continues to require MAX cues for encouragement and participating in therapy.  Also provided max cues for not utilizing hospital bed functions for bed mobility, however pt refused.  He also refused exercises due to increased back pain following amb.     Follow Up Recommendations  Home health PT;SNF;Supervision/Assistance - 24 hour     Does the patient have the potential to tolerate intense rehabilitation     Barriers to Discharge        Equipment Recommendations  None recommended by PT    Recommendations for Other Services    Frequency Min 3X/week   Plan Discharge plan needs to be updated    Precautions / Restrictions Precautions Precautions: Fall Restrictions Weight Bearing Restrictions: No   Pertinent Vitals/Pain 8/10 pain in lower back.  RN notified.     Mobility  Bed Mobility Bed Mobility: Supine to Sit Right Sidelying to Sit: 5: Supervision;HOB elevated;With rails Details for Bed Mobility Assistance: MAX cues for participating with therapy and also for maintaining HOB flat and not using rails to ensure pt can do once D/C'd home.  Pt refused to perform bed mobility as it will be at home.  Transfers Transfers: Sit to Stand;Stand to Sit Sit to Stand: 4: Min guard;With upper extremity assist;From bed Stand to Sit: 4: Min guard;With upper extremity assist;With armrests;To chair/3-in-1 Details for Transfer Assistance: Cues for hand placement and safety when sitting/standing.  Ambulation/Gait Ambulation/Gait Assistance: 4: Min guard;4: Min Environmental consultant (Feet): 260 Feet (w/ 2 standing rest breaks. ) Assistive device: Rolling walker Ambulation/Gait Assistance Details: Pt with very slow gait speed and flexed posture.   Cues for maintaining position inside of RW and maintaining upright posture.  Pt had to take two standing rest breaks in which he wanted to slump over RW.  Max cues for safety and pulling over to wall to lean against.  Gait Pattern: Step-through pattern;Decreased stride length;Shuffle Gait velocity: decreased Stairs: No    Exercises     PT Diagnosis:    PT Problem List:   PT Treatment Interventions:     PT Goals Acute Rehab PT Goals PT Goal Formulation: With patient Time For Goal Achievement: 05/09/12 Potential to Achieve Goals: Good Pt will go Supine/Side to Sit: with modified independence PT Goal: Supine/Side to Sit - Progress: Progressing toward goal Pt will go Sit to Stand: with modified independence PT Goal: Sit to Stand - Progress: Progressing toward goal Pt will Ambulate: >150 feet;with modified independence;with least restrictive assistive device PT Goal: Ambulate - Progress: Progressing toward goal Pt will Perform Home Exercise Program: with supervision, verbal cues required/provided PT Goal: Perform Home Exercise Program - Progress: Not progressing  Visit Information  Last PT Received On: 05/04/12 Assistance Needed: +1    Subjective Data  Subjective: If its what I have to do, I'll do it (about getting OOB) Patient Stated Goal: to return home with friend.    Cognition  Cognition Overall Cognitive Status: Appears within functional limits for tasks assessed/performed Arousal/Alertness: Lethargic Orientation Level: Appears intact for tasks assessed Behavior During Session: Lethargic    Balance     End of Session PT - End of Session Activity Tolerance: Patient limited by fatigue Patient left: in chair;with call bell/phone within reach Nurse Communication: Mobility status;Patient requests pain meds  GP     Vista Deck 05/04/2012, 10:07 AM

## 2012-05-04 NOTE — Progress Notes (Signed)
Patient ID: [name], male   DOB: [birth year], 72 y.o.   MRN: [id number]  General Surgery - [facility] - Progress Note  Subjective: Patient comfortable.  Taking po liquids, supplements without difficulty now.  Objective: Vital signs in last 24 hours: Temp:  [98.1 F (36.7 C)-99 F (37.2 C)] 98.1 F (36.7 C) (03/19 0645) Pulse Rate:  [69-82] 75 (03/19 0925) Resp:  [16-20] 20 (03/19 0645) BP: (117-149)/(47-73) 130/47 mmHg (03/19 0925) SpO2:  [97 %-100 %] 97 % (03/19 0645) Weight:  [159 lb 2.8 oz (72.2 kg)] 159 lb 2.8 oz (72.2 kg) (03/19 0645) Last BM Date: 05/02/12  Intake/Output from previous day: 03/18 0701 - 03/19 0700 In: 120 [P.O.:120] Out: 1075 [Urine:1075]  Exam: HEENT - clear, not icteric Neck - soft Chest - clear bilaterally Cor - RRR, no murmur Abd - soft without distension Ext - no significant edema Neuro - grossly intact, no focal deficits  Lab Results:   Recent Labs  05/03/12 0442 05/04/12 0456  WBC 10.2 11.8*  HGB 9.3* 9.4*  HCT 28.6* 28.7*  PLT 475* 420*     Recent Labs  05/03/12 0442 05/04/12 0456  NA 140 137  K 3.3* 3.4*  CL 106 103  CO2 26 25  GLUCOSE 182* 223*  BUN 5* 5*  CREATININE 0.82 0.82  CALCIUM 8.4 8.3*    Studies/Results: Dg Ugi W/kub  05/03/2012  *RADIOLOGY REPORT*  Clinical Data: 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 Now with pain and vomiting and difficulty eating  ESOPHOGRAM/BARIUM SWALLOW  Technique:  Single contrast examination was performed using Omnipaque followed by thin barium.  Fluoroscopy time:  5.3 minutes.  Comparison:  CT 04/06/2012  Findings:  Preliminary KUB reveals nonobstructive bowel gas pattern.  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  No esophageal stricture or hiatal hernia was identified.  There has been resection of a large tumor arising from the greater curve of the stomach.  The body of the stomach is filled with barium.  There is a narrow  channel    between the gastric body and gastric antrum.  This was studied in some detail in  multiple positions and appears to be a persistent area of narrowing.  The gastric antrum is normal.  The pylorus and duodenal bulb are normal.  There is contrast in the proximal small bowel which is nondilated.  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  This appears to be a significant stricture accounting for the patient's current symptoms.  I discussed the findings with Dr. Lindie Spruce and Dr. Matthias Hughs.   Original Report Authenticated By: Janeece Riggers, [name]     Assessment / Plan: 1.  Status post partial gastrectomy for GIST tumor  Significant narrowing in distal body of stomach by UGI series due to resection  Appears to be able to maintain nutritional needs with liquid diet  No role for immediate operative intervention  Will arrange follow up with Dr. Lindie Spruce at [facility] office in a few weeks to assess progress  Will sign off, call if needed  Velora Heckler, MD, [facility] Office: [phone]  05/04/2012

## 2012-05-04 NOTE — Progress Notes (Signed)
Spoke with Dr. Lindie Spruce yesterday, who felt that endoscopy was not essential for the patient's management, and might expose him to a slight risk of disruption of his recent surgical anatomy. Accordingly, we have decided to hold off on endoscopic evaluation for the present time.  The current plan is to see how the patient does on full liquids. So far, he is tolerating them okay, although he does get a subjective sense of fullness after consuming them.  If he cannot meet nutritional needs orally, one option, short of TNA, might be having a feeding tube placed by interventional radiology, guided through the narrowed area of the stomach under fluoroscopic guidance, to the distal stomach or proximal small bowel.  At this time, I will plan to sign off. However, please feel free to contact me if you feel that further input from [place] would be helpful.  Florencia Reasons, [name] [phone]

## 2012-05-04 NOTE — Progress Notes (Signed)
MD made aware of patient having frequent PVC's this am. VS stable and patient has been asymptomatic.  MD was again notified of more PVC's. MAG level and potassium po ordered.  Will continue to closely monitor patient. Setzer, Don Broach

## 2012-05-04 NOTE — Progress Notes (Signed)
Patient is active with [facility] for home health care services as prior to admission.

## 2012-05-04 NOTE — Progress Notes (Addendum)
[facility] PROGRESS NOTE  DAGON BUDAI ZOX:[id number] DOB: 01/29/1941 DOA: 04/29/2012 PCP: [name], MD  Assessment/Plan:  1 acute renal failure: pre renal. Improved with IV fluids.   2 hypokalemia: Mg low at 1.3. Will replaced magnesium. Will give 2 doses Kcl 40 meq.   3 leukocytosis  Chest x-ray is negative. Urinalysis is negative on admission. Will repeat UA. WBC mildly increase. Repeat in am.  4 anemia of chronic disease. Hb stable.  5 diabetes mellitus type 2/hypoglycemia  Holding oral medication due to hypoglycemia.  6. Dysphagia  Barium swallow and UGI series: stricture in the distal body of the stomach in the area of  recent partial gastrectomy. Patient is at high risk for complication if endoscopy is done due to recent surgery. Surgery does not recommend further surgery at this time. Dr Matthias Hughs does not recommend endoscopy at this time. Patient able to tolerates liquid diet.   7 hypertension  Stable. Continue Norvasc and metoprolol and Flomax.  8 dehydration  Resolved. 9 history of atrial fibrillation  Continue metoprolol. Plavix. 10 history of CVA  Continue Plavix.  11 history of stromal cell tumor  Will need to followup with oncology as an outpatient.  12.PVC: replete electrolytes.    Code Status: Full  Family Communication: Care discussed with patient.  Disposition Plan: Home 1 or 2 days. Discontinue foley, PT , ot consult.    Consultants: GI Dr Matthias Hughs 05/02/12   Procedures: CT head 04/29/2012  Barium swallow UGI series 05/03/2012   Antibiotics: None.  HPI/Subjective: Awake, feeling ok. He is tired. He will drink ensure.   Objective: Filed Vitals:   05/03/12 1300 05/03/12 2115 05/04/12 0645 05/04/12 0925  BP: 149/47 117/73 147/57 130/47  Pulse: 81 69 82 75  Temp: 98.4 F (36.9 C) 99 F (37.2 C) 98.1 F (36.7 C)   TempSrc: Oral Oral Oral   Resp: 16 20 20    Height:      Weight:   72.2 kg (159 lb 2.8 oz)   SpO2: 100% 97% 97%      Intake/Output Summary (Last 24 hours) at 05/04/12 1153 Last data filed at 05/04/12 1047  Gross per 24 hour  Intake    240 ml  Output   1075 ml  Net   -835 ml   Filed Weights   04/30/12 0057 05/04/12 0645  Weight: 71.2 kg (156 lb 15.5 oz) 72.2 kg (159 lb 2.8 oz)    Exam:   General:  No distress.  Cardiovascular: S 1, S 2 RRR  Respiratory: CTA  Abdomen: BS present, incision healing, no rigidity.  Musculoskeletal: no edema.   Data Reviewed: Basic Metabolic Panel:  Recent Labs Lab 04/30/12 0730 05/01/12 0500 05/02/12 0430 05/03/12 0442 05/04/12 0456  NA 138 139 136 140 137  K 2.7* 4.2 3.4* 3.3* 3.4*  CL 102 107 104 106 103  CO2 24 24 23 26 25   GLUCOSE 32* 154* 186* 182* 223*  BUN 16 8 4* 5* 5*  CREATININE 1.21 1.05 0.86 0.82 0.82  CALCIUM 7.9* 7.8* 7.8* 8.4 8.3*  MG 1.6 1.9  --   --  1.3*   Liver Function Tests:  Recent Labs Lab 04/29/12 1715 04/30/12 0730 05/04/12 0456  AST 12 11 8   ALT 6 <5 4  ALKPHOS 109 86 78  BILITOT 0.3 0.2* 0.2*  PROT 7.0 6.1 5.5*  ALBUMIN 2.8* 2.5* 2.1*   No results found for this basename: LIPASE, AMYLASE,  in the last 168 hours No results found for  this basename: AMMONIA,  in the last 168 hours CBC:  Recent Labs Lab 04/29/12 1715  04/30/12 0730 05/01/12 0500 05/02/12 0430 05/03/12 0442 05/04/12 0456  WBC 12.5*  --  11.7* 11.3* 10.8* 10.2 11.8*  NEUTROABS 9.9*  --   --   --   --   --   --   HGB 10.5*  < > 9.1* 9.1* 8.9* 9.3* 9.4*  HCT 33.2*  < > 28.7* 29.0* 27.7* 28.6* 28.7*  MCV 88.8  --  87.8 88.7 88.5 87.7 87.2  PLT 582*  --  521* 545* 441* 475* 420*  < > = values in this interval not displayed. Cardiac Enzymes:  Recent Labs Lab 04/30/12 0105 04/30/12 0730 04/30/12 1315  TROPONINI <0.30 <0.30 <0.30   BNP (last 3 results) No results found for this basename: PROBNP,  in the last 8760 hours CBG:  Recent Labs Lab 05/03/12 1144 05/03/12 1659 05/03/12 2121 05/04/12 0749 05/04/12 1141  GLUCAP  154* 227* 258* 180* 209*    No results found for this or any previous visit (from the past 240 hour(s)).   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 Now with pain and vomiting and difficulty eating  ESOPHOGRAM/BARIUM SWALLOW  Technique:  Single contrast examination was performed using Omnipaque followed by thin barium.  Fluoroscopy time:  5.3 minutes.  Comparison:  CT 04/06/2012  Findings:  Preliminary KUB reveals nonobstructive bowel gas pattern.  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  No esophageal stricture or hiatal hernia was identified.  There has been resection of a large tumor arising from the greater curve of the stomach.  The body of the stomach is filled with barium.  There is a narrow channel    between the gastric body and gastric antrum.  This was studied in some detail in  multiple positions and appears to be a persistent area of narrowing.  The gastric antrum is normal.  The pylorus and duodenal bulb are normal.  There is contrast in the proximal small bowel which is nondilated.  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  This appears to be a significant stricture accounting for the patient's current symptoms.  I discussed the findings with Dr. Lindie Spruce and Dr. Matthias Hughs.   Original Report Authenticated By: Janeece Riggers, [name]     Scheduled Meds: . amLODipine  10 mg Oral Daily  . atorvastatin  40 mg Oral Daily  . clopidogrel  75 mg Oral Q breakfast  . enoxaparin (LOVENOX) injection  40 mg Subcutaneous Q24H  . feeding supplement  237 mL Oral BID BM  . feeding supplement  1 Container Oral TID BM  . magnesium sulfate 1 - 4 g bolus IVPB  2 g Intravenous Once  . metoprolol tartrate  12.5 mg Oral BID  . multivitamin with minerals  1 tablet Oral Daily  . pantoprazole  40 mg Oral BID  . potassium chloride  40 mEq Oral Once  .  QUEtiapine  200 mg Oral Q24H  . sodium chloride  1,000 mL Intravenous Once  . sodium chloride  3 mL Intravenous Q12H  . tamsulosin  0.4 mg Oral Daily   Continuous Infusions:   Principal Problem:   Acute on chronic renal failure Active Problems:   Atrial fibrillation   Anemia   Lactic acidosis   Leukocytosis   H/O: CVA (cerebrovascular accident)   Depression  Diabetes   HTN (hypertension)   FTT (failure to thrive) in adult   Dehydration   Stromal cell tumor   Hypokalemia   Leukocytosis, unspecified   Hypoglycemia   Dysphagia, unspecified    Time spent: 25 minutes   REGALADO,BELKYS  [facility] Pager [phone]. If 7PM-7AM, please contact night-coverage at [email or web address], password [facility] 05/04/2012, 11:53 AM  LOS: 5 days

## 2012-05-05 LAB — BASIC METABOLIC PANEL
CO2: 25 mEq/L (ref 19–32)
Chloride: 106 mEq/L (ref 96–112)
Sodium: 140 mEq/L (ref 135–145)

## 2012-05-05 LAB — GLUCOSE, CAPILLARY: Glucose-Capillary: 159 mg/dL — ABNORMAL HIGH (ref 70–99)

## 2012-05-05 LAB — CBC
HCT: 29.3 % — ABNORMAL LOW (ref 39.0–52.0)
Hemoglobin: 9.5 g/dL — ABNORMAL LOW (ref 13.0–17.0)
MCV: 88.5 fL (ref 78.0–100.0)
Platelets: 411 10*3/uL — ABNORMAL HIGH (ref 150–400)
RBC: 3.31 MIL/uL — ABNORMAL LOW (ref 4.22–5.81)
WBC: 10.2 10*3/uL (ref 4.0–10.5)

## 2012-05-05 MED ORDER — MAGNESIUM SULFATE 40 MG/ML IJ SOLN: 2.0000 g | Freq: Once | INTRAMUSCULAR | Status: DC

## 2012-05-05 MED ORDER — POTASSIUM CHLORIDE CRYS ER 20 MEQ PO TBCR
40.0000 meq | EXTENDED_RELEASE_TABLET | Freq: Every day | ORAL | Status: DC
  Administered 2012-05-05 – 2012-05-06 (×2): 40 meq via ORAL
  Filled 2012-05-05 (×3): qty 2

## 2012-05-05 MED ORDER — POTASSIUM CHLORIDE 20 MEQ PO PACK
40.0000 meq | PACK | Freq: Every day | ORAL | Status: DC
  Filled 2012-05-05 (×2): qty 2

## 2012-05-05 MED ORDER — ENSURE COMPLETE PO LIQD: 237.0000 mL | Freq: Two times a day (BID) | ORAL | Status: DC

## 2012-05-05 MED ORDER — ADULT MULTIVITAMIN W/MINERALS CH: 1.0000 | ORAL_TABLET | Freq: Every day | ORAL | Status: DC

## 2012-05-05 MED ORDER — POTASSIUM CHLORIDE 20 MEQ PO PACK: 40.0000 meq | PACK | Freq: Every day | ORAL | Status: DC

## 2012-05-05 MED ORDER — PANTOPRAZOLE SODIUM 40 MG PO PACK: 40.0000 mg | PACK | Freq: Two times a day (BID) | ORAL | Status: DC

## 2012-05-05 MED ORDER — MAGNESIUM SULFATE 40 MG/ML IJ SOLN
2.0000 g | Freq: Once | INTRAMUSCULAR | Status: AC
  Administered 2012-05-05: 2 g via INTRAVENOUS
  Filled 2012-05-05: qty 50

## 2012-05-05 NOTE — Progress Notes (Signed)
PCP is Dr Julio Sicks - apt made for May 09, 2012 at 4pm [phone]) for follow up medical care;

## 2012-05-05 NOTE — Progress Notes (Signed)
PT Cancellation Note  Patient Details Name: JEFF FRIEDEN MRN: [id number] DOB: 1941/01/01   Cancelled Treatment:    Reason Eval/Treat Not Completed: Pain limiting ability to participate Pt reports that he sat up for 2 hrs after OT and now his back pain is elevated, and he states he has not received pain meds although has called out.  RN notified.   Nelle Sayed,KATHrine E 05/05/2012, 2:26 PM Pager: [phone]

## 2012-05-05 NOTE — Progress Notes (Signed)
Occupational Therapy Treatment Patient Details Name: AIDIAN SALOMON MRN: [id number] DOB: 09/27/1940 Today's Date: 05/05/2012 Time: 1010-1043 OT Time Calculation (min): 33 min  OT Assessment / Plan / Recommendation Comments on Treatment Session needed max encouragement                   Plan Discharge plan remains appropriate    Precautions / Restrictions Precautions Precautions: Fall Restrictions Weight Bearing Restrictions: No       ADL  Grooming: Performed;Wash/dry face;Min guard Where Assessed - Grooming: Unsupported standing;Other (comment) (pt eyes closed- encouraged him to open them) Toilet Transfer: Performed;Other (comment);Min guard (bed top chair, increased time needed and Vc to open eyes) Toilet Transfer Method: Sit to stand ADL Comments: Pt with lack of motivation this OT session.  Stated he did not like therapy and would do ok with everything at home. Explained benefits of therapy and why important to participate. Pt listened with eyes closed, but did not agree.      OT Goals ADL Goals ADL Goal: Toilet Transfer - Progress: Other (comment) (slow progress)  Visit Information  Last OT Received On: 05/05/12    Subjective Data  Subjective: Pt needed much encouragement to get OOB and wash face. Eyes closed most of session- despite encourgagement      Cognition  Cognition Overall Cognitive Status: Appears within functional limits for tasks assessed/performed Arousal/Alertness: Lethargic Orientation Level: Appears intact for tasks assessed Behavior During Session: Lethargic    Mobility  Bed Mobility Bed Mobility: Supine to Sit Supine to Sit: 4: Min assist;With rails;Other (comment) (increased time) Transfers Transfers: Sit to Stand;Stand to Sit Sit to Stand: 4: Min guard;From bed Stand to Sit: 4: Min guard;To chair/3-in-1          End of Session OT - End of Session Activity Tolerance: Patient limited by fatigue Patient left: in chair;with call  bell/phone within reach Nurse Communication: Mobility status  GO     Alba Cory 05/05/2012, 11:02 AM

## 2012-05-05 NOTE — Discharge Summary (Addendum)
Physician Discharge Summary  DASON MOSLEY ZOX:[id number] DOB: 1940/04/20 DOA: 04/29/2012  PCP: [name], MD  Admit date: 04/29/2012 Discharge date: 05/05/2012  Time spent: 35 minutes  Recommendations for Outpatient Follow-up:  1. Follow up with Dr Marijean Niemann for post operative care. 2. Follow up with oncologist. 3. Needs to follow up with PCP for B-met, Mg level and assessment of nutrition status.   Discharge Diagnoses:    Acute on chronic renal failure   Failure to thrive   Stricture stomach prior gastrectomy site.    Atrial fibrillation   Anemia   Lactic acidosis   Leukocytosis   H/O: CVA (cerebrovascular accident)   Depression   Diabetes   HTN (hypertension)   FTT (failure to thrive) in adult   Dehydration   Stromal cell tumor   Hypokalemia   Leukocytosis, unspecified   Hypoglycemia   Dysphagia, unspecified   Discharge Condition: Stable.  Diet recommendation: Full liquid diet.   Filed Weights   04/30/12 0057 05/04/12 0645  Weight: 71.2 kg (156 lb 15.5 oz) 72.2 kg (159 lb 2.8 oz)    History of present illness:  72 yo male with 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 Has had no appetite. Lives alone. HH first visit today to his house and PT. No fevers. No n/v/d. No rashes. Loosing weight. Went to onc office for evaluation of further treatment options and was noted to be very weak and dehydrated so sent to ED for evaluation. Patient does not like the food. He wants to eat regular food. He has been able to drink the ensure. He doesn't like the soup.   Hospital Course:  Patient admitted with dehydration, decrease oral intake. He was found to have  stricture in the distal body of the stomach in the area of recent partial gastrectomy. Dr Matthias Hughs and Dr Marijean Niemann recommend full liquid diet, no endoscopy procedure or surgery at this time. Patient will be able to  maintain nutrition demand with fluid liquid diet, ensure, Breeze supplement. He has been able to drink ensures. He will need to follow up with Dr Marijean Niemann for further care. Patient was evaluated by nutritionist.  1 acute renal failure: pre renal. Improved with IV fluids. Resolved. 2 hypokalemia: Mg increase to 1.7. Will give 2 Gr IV prior to discharge. Will discharge on Kcl daily supplement. 3 leukocytosis Chest x-ray is negative. Urinalysis is negative on admission. Will repeat UA negative. Repeat cbc in am normal.  4 anemia of chronic disease. Hb stable.  5 diabetes mellitus type 2/hypoglycemia  Holding oral medication due to hypoglycemia. Discharge on metformin. 6. Dysphagia  Barium swallow and UGI series: stricture in the distal body of the stomach in the area of  recent partial gastrectomy. Patient is at high risk for complication if endoscopy is done due to recent surgery. Surgery does not recommend further surgery at this time. Dr Matthias Hughs does not recommend endoscopy at this time. Patient able to tolerates liquid diet.  7 hypertension  Stable. Continue Norvasc and metoprolol and Flomax.  8 dehydration  Resolved.  9 history of atrial fibrillation  Continue metoprolol. Plavix.  10 history of CVA  Continue Plavix.  11 history of stromal cell tumor  Will need to followup with oncology as an outpatient.  12.PVC: replete electrolytes.  13-Depression: patient likely depressed. He has poor eyes contact, he was crying. I will delay discharge. I  will consult psych for medications recommendations.   Procedures: CT head 04/29/2012  Barium swallow UGI series 05/03/2012   Consultations:  Dr Estevan Oaks surgery.  Dr Matthias Hughs  Discharge Exam: Filed Vitals:   05/04/12 2134 05/04/12 2325 05/05/12 0451 05/05/12 0857  BP: 128/54 147/74 137/44 132/64  Pulse: 72 80 80 76  Temp: 98.8 F (37.1 C)  98.5 F (36.9 C)   TempSrc: Oral  Oral   Resp: 18  16   Height:      Weight:      SpO2:  97%  98%     General: no distress. Cardiovascular: S 1, S 2 RRR Respiratory: CTA  Discharge Instructions  Discharge Orders   Future Appointments Provider Department Dept Phone   05/18/2012 1:45 PM Rana Snare, NP [facility] [phone]   Future Orders Complete By Expires     Increase activity slowly  As directed         Medication List    STOP taking these medications       glimepiride 4 MG tablet  Commonly known as:  AMARYL     pantoprazole 40 MG tablet  Commonly known as:  PROTONIX     potassium chloride SA 20 MEQ tablet  Commonly known as:  K-DUR,KLOR-CON      TAKE these medications       amLODipine 10 MG tablet  Commonly known as:  NORVASC  Take 1 tablet (10 mg total) by mouth daily.     atorvastatin 40 MG tablet  Commonly known as:  LIPITOR  Take 40 mg by mouth daily.     clopidogrel 75 MG tablet  Commonly known as:  PLAVIX  Take 75 mg by mouth daily.     feeding supplement Liqd  Take 1 Container by mouth 3 (three) times daily between meals.     feeding supplement Liqd  Take 237 mLs by mouth 2 (two) times daily between meals.     metFORMIN 1000 MG tablet  Commonly known as:  GLUCOPHAGE  Take 1,000 mg by mouth 2 (two) times daily with a meal.     metoprolol tartrate 25 MG tablet  Commonly known as:  LOPRESSOR  Take 12.5 mg by mouth 2 (two) times daily.     multivitamin with minerals Tabs  Take 1 tablet by mouth daily.     oxyCODONE-acetaminophen 5-325 MG per tablet  Commonly known as:  PERCOCET/ROXICET  Take 1-2 tablets by mouth every 6 (six) hours as needed.     pantoprazole sodium 40 mg/20 mL Pack  Commonly known as:  PROTONIX  Take 20 mLs (40 mg total) by mouth 2 (two) times daily.     polyethylene glycol packet  Commonly known as:  MIRALAX / GLYCOLAX  Take 17 g by mouth daily as needed (He needs 1-2 smooth BM per day.).     potassium chloride 20 MEQ packet  Commonly known as:  KLOR-CON  Take 40 mEq by  mouth daily.     QUEtiapine 200 MG tablet  Commonly known as:  SEROQUEL  Take 1 tablet (200 mg total) by mouth daily at 8 pm.     tamsulosin 0.4 MG Caps  Commonly known as:  FLOMAX  Take 0.4 mg by mouth daily.     Vitamin D-3 5000 UNITS Tabs  Take 5,000 Units by mouth daily.           Follow-up Information   Follow up with OSEI-BONSU,GEORGE, MD In 1 week.  Contact information:   [address] [place] [address] [phone]       Call Velora Heckler, MD.   Contact information:   [address] [place] [address] [phone]        The results of significant diagnostics from this hospitalization (including imaging, microbiology, ancillary and laboratory) are listed below for reference.    Significant Diagnostic Studies: Ct Abdomen Pelvis Wo Contrast  04/06/2012  *RADIOLOGY REPORT*  Clinical Data: Left upper quadrant mass on ultrasound  CT ABDOMEN AND PELVIS WITHOUT CONTRAST  Technique:  Multidetector CT imaging of the abdomen and pelvis was performed following the standard protocol without intravenous contrast.  Comparison: Ultrasound of the kidneys of 04/05/2012  Findings: The lung bases are clear.  There is moderate cardiomegaly present.  No pericardial effusion is seen.  The liver is unremarkable in the unenhanced state.  Faintly calcified gallstones are noted within the somewhat distended gallbladder and the gallbladder wall is slightly thickened as well and clinical correlation is recommended to exclude cholecystitis.  The pancreas appears somewhat atrophic and the pancreatic duct is not dilated. The adrenal glands are both slightly nodular most consistent with adrenal adenomas.  Metastatic involvement would be difficult to exclude.  The spleen is normal in size.  There is a large rounded soft tissue mass within the left upper quadrant as noted by ultrasound which appears to be intimately associated with the greater curvature of the stomach.   This mass measures approximately 11.1 x 12.7 x 13.2 cm and is somewhat inhomogeneous in attenuation on this unenhanced study.  The primary considerations are that of a gastrointestinal stromal tumor (G I S T tumor), gastric carcinoma or massive adenopathy as with lymphoma. Biopsy could be performed in view of the location of this mass.  The kidneys are unremarkable in the unenhanced state.  No renal calculi are noted.  No adenopathy is seen.   The abdominal aorta is normal in caliber.  The urinary bladder is unremarkable.  A reservoir for penile prosthesis is noted just anterior to the urinary bladder.  The prostate is slightly prominent.  No abnormality of the colon is seen.  The appendix is unremarkable.  There are degenerative changes diffusely throughout the facet joints of the lower lumbar spine.  IMPRESSION:  1.  Large somewhat inhomogeneous rounded soft tissue mass in the left upper quadrant appears to be intimately associated with the greater curvature of the stomach and may represent a GIST tumor versus gastric carcinoma  carcinoma  carcinoma or adenopathy as with lymphoma.  Biopsy could be performed. 2.  Faintly calcified gallstones within a somewhat thick-walled gallbladder.  Rule out acute cholecystitis. 3.  Nodular adrenal glands.  Possibly adrenal adenomas but cannot exclude metastatic involvement.   Original Report Authenticated By: Dwyane Dee, [name]    Dg Chest 2 View  04/05/2012  *RADIOLOGY REPORT*  Clinical Data: Altered mental status.  CHEST - 2 VIEW  Comparison: 03/03/2012.  Findings: The cardiac silhouette, mediastinal and hilar contours are stable.  Stable surgical changes from bypass surgery.  No acute pulmonary findings.  No pleural effusion or pneumothorax.  The bony thorax is intact.  IMPRESSION: No acute cardiopulmonary findings.   Original Report Authenticated By: Rudie Meyer, [name]    Ct Head Wo Contrast  04/29/2012  *RADIOLOGY REPORT*  Clinical Data: 72 year old male with altered  mental status.  CT HEAD WITHOUT CONTRAST  Technique:  Contiguous axial images were obtained from the base of the  skull through the vertex without contrast.  Comparison: 04/05/2012 and 07/28/2010 head CTs  Findings: Mild generalized cerebral volume loss and chronic small vessel white matter ischemic changes again noted. A remote right cerebellar infarct is unchanged. No acute intracranial abnormalities are identified, including mass lesion or mass effect, hydrocephalus, extra-axial fluid collection, midline shift, hemorrhage, or acute infarction.  The visualized bony calvarium is unremarkable.  IMPRESSION: No evidence of acute intracranial abnormality.  Chronic small vessel white matter ischemic changes and remote right cerebellar infarct.   Original Report Authenticated By: Harmon Pier, [name]    Ct Head Wo Contrast  04/05/2012  *RADIOLOGY REPORT*  Clinical Data: Weakness and altered mental status.  CT HEAD WITHOUT CONTRAST  Technique:  Contiguous axial images were obtained from the base of the skull through the vertex without contrast.  Comparison: 08/22/2010.  Findings: Stable age related cerebral atrophy, ventriculomegaly and periventricular white matter disease.  No extra-axial fluid collections.  No CT findings for hemispheric infarction and/or intracranial hemorrhage.  No mass lesions.  The brainstem and cerebellum are normal and stable. There is a remote right cerebellar hemispheric infarction.  The bony structures are intact.  No skull fracture.  Mild mucoperiosteal thickening in the left half of the sphenoid sinus and scattered ethmoid air cells.  The mastoid air cells are clear.  IMPRESSION:  1. Stable age related cerebral atrophy, ventriculomegaly and periventricular white matter disease. 2.  Remote right-sided cerebellar infarction. 3.  No acute intracranial findings or mass lesion.   Original Report Authenticated By: Rudie Meyer, [name]    US Renal  04/05/2012  *RADIOLOGY REPORT*  Clinical Data: Acute  renal failure  RENAL/URINARY TRACT ULTRASOUND COMPLETE  Comparison:  03/10/2006 CT  Findings:  Right Kidney:  Measures 10.8 cm.  No hydronephrosis.  1.5 x 1.3 cm interpolar hypoechoic lesion is incompletely characterized.  Left Kidney:  Measures 10.5 cm.  No hydronephrosis or focal abnormality.  Bladder:  Not visualized.  The patient has a Foley catheter in place.  Incidental note is made of a mixed cystic and solid mass in the left upper quadrant.  IMPRESSION: No hydronephrosis.  Incompletely characterized 1.5 cm hypoechoic right renal lesion, favored to reflect a mildly complex cyst.  Incompletely characterized 15 cm left upper quadrant mass.  Contrast enhanced CT or MRI recommended to further evaluate the above findings.   Original Report Authenticated By: Jearld Lesch, [name]    US Biopsy  04/11/2012  *RADIOLOGY REPORT*  ULTRASOUND-GUIDED CORE BIOPSY  Date: 04/11/2012  Clinical History: 72 year old male with areas of recently diagnosed left upper quadrant mass appears pedunculated and exophytic from the greater curvature of the stomach.  He presents for ultrasound- guided biopsy to facilitate tissue diagnosis.  Procedures Performed: 1. Ultrasound-guided core biopsy  Interventional Radiologist:  [name], MD  Sedation: Moderate (conscious) sedation was used.  One mg Versed, 50 mcg Fentanyl were administered intravenously.  The patient's vital signs were monitored continuously by radiology nursing throughout the procedure.  Sedation Time: 10 minutes  Fluoroscopy time: None  Contrast volume: None  PROCEDURE/FINDINGS:   Informed consent was obtained from the patient following explanation of the procedure, risks, benefits and alternatives. The patient understands, agrees and consents for the procedure. All questions were addressed. A time out was performed.  Maximal barrier sterile technique utilized including caps, mask, sterile gowns, sterile gloves, large sterile drape, hand hygiene, and betadine  skin prep.  The left upper quadrant was interrogated with ultrasound.  There is a large complex cystic  and solid mass in the left upper quadrant which is inseparable from the stomach.  The internal cystic area likely represent areas of internal necrosis. A suitable skin entry site was selected marked.  Local anesthesia was obtained by infiltration of 1% lidocaine.  Under direct sonographic guidance, a 17 gauge trocar needle was advanced through the abdominal wall and into the mass.  Several 18-gauge core biopsies were then coaxially obtained from the solid portion of the mass.  Biopsies were placed in formalin.  As the 17 gauge outer cannula was removed, the biopsy tract was embolized with a Gelfoam slurry.  Post biopsy imaging demonstrated no active hemorrhage or perilesional hematoma.  The patient tolerated the procedure well.  No immediate complication.  IMPRESSION:  Technically successful ultrasound guided core biopsy of left upper quadrant mass lesion which is likely arising from the gastric wall.  Signed,  [name], MD Vascular & Interventional Radiologist [facility]   Original Report Authenticated By: [name], [name]    Dg Chest Port 1 View  04/29/2012  *RADIOLOGY REPORT*  Clinical Data: Hypertension and diabetes.  Complains of weakness, fatigue, and altered mental status.  PORTABLE CHEST - 1 VIEW  Comparison: 04/05/2012  Findings: Stable postoperative changes in the mediastinum. Borderline heart size with normal pulmonary vascularity.  This is likely normal for portable technique.  No focal consolidation or airspace disease.  No blunting of costophrenic angles.  No pneumothorax.  Mediastinal contours appear intact.  Degenerative changes in the thoracic spine and shoulders.  No significant change since previous study.  IMPRESSION: No evidence of active pulmonary disease.   Original Report Authenticated By: Burman Nieves, [name]    Dg Kayleen Memos [place]  05/03/2012  *RADIOLOGY REPORT*   Clinical Data: 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 Now with pain and vomiting and difficulty eating  ESOPHOGRAM/BARIUM SWALLOW  Technique:  Single contrast examination was performed using Omnipaque followed by thin barium.  Fluoroscopy time:  5.3 minutes.  Comparison:  CT 04/06/2012  Findings:  Preliminary KUB reveals nonobstructive bowel gas pattern.  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  No esophageal stricture or hiatal hernia was identified.  There has been resection of a large tumor arising from the greater curve of the stomach.  The body of the stomach is filled with barium.  There is a narrow channel    between the gastric body and gastric antrum.  This was studied in some detail in  multiple positions and appears to be a persistent area of narrowing.  The gastric antrum is normal.  The pylorus and duodenal bulb are normal.  There is contrast in the proximal small bowel which is nondilated.  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  This appears to be a significant stricture accounting for the patient's current symptoms.  I discussed the findings with Dr. Lindie Spruce and Dr. Matthias Hughs.   Original Report Authenticated By: Janeece Riggers, [name]     Microbiology: No results found for this or any previous visit (from the past 240 hour(s)).   Labs: Basic Metabolic Panel:  Recent Labs Lab 04/30/12 0730 05/01/12 0500 05/02/12 0430 05/03/12 0442 05/04/12 0456 05/05/12 0438  NA 138 139 136 140 137 140  K 2.7* 4.2 3.4* 3.3* 3.4* 3.5  CL 102 107 104 106 103 106  CO2 24 24 23 26 25 25   GLUCOSE 32* 154* 186* 182* 223* 161*  BUN 16 8 4* 5* 5* 7  CREATININE 1.21 1.05 0.86 0.82 0.82 0.83  CALCIUM 7.9* 7.8* 7.8* 8.4 8.3* 8.6  MG 1.6 1.9  --   --  1.3* 1.7   Liver Function Tests:  Recent Labs Lab 04/29/12 1715 04/30/12 0730 05/04/12 0456  AST 12 11 8   ALT 6 <5 4  ALKPHOS 109 86 78  BILITOT 0.3  0.2* 0.2*  PROT 7.0 6.1 5.5*  ALBUMIN 2.8* 2.5* 2.1*   No results found for this basename: LIPASE, AMYLASE,  in the last 168 hours No results found for this basename: AMMONIA,  in the last 168 hours CBC:  Recent Labs Lab 04/29/12 1715  05/01/12 0500 05/02/12 0430 05/03/12 0442 05/04/12 0456 05/05/12 0438  WBC 12.5*  < > 11.3* 10.8* 10.2 11.8* 10.2  NEUTROABS 9.9*  --   --   --   --   --   --   HGB 10.5*  < > 9.1* 8.9* 9.3* 9.4* 9.5*  HCT 33.2*  < > 29.0* 27.7* 28.6* 28.7* 29.3*  MCV 88.8  < > 88.7 88.5 87.7 87.2 88.5  PLT 582*  < > 545* 441* 475* 420* 411*  < > = values in this interval not displayed. Cardiac Enzymes:  Recent Labs Lab 04/30/12 0105 04/30/12 0730 04/30/12 1315  TROPONINI <0.30 <0.30 <0.30   BNP: BNP (last 3 results) No results found for this basename: PROBNP,  in the last 8760 hours CBG:  Recent Labs Lab 05/04/12 0749 05/04/12 1141 05/04/12 1705 05/04/12 2135 05/05/12 0750  GLUCAP 180* 209* 209* 233* 159*       Signed:  Adelfo Diebel  [facility] 05/05/2012, 10:54 AM  3/21:  Patient sen and examined. Feeling well. He will continue to drink ensure, breeze. He will follow up with his outpatient psychiatrist.  Stable to be discharge. Hildagarde Holleran, md.

## 2012-05-06 LAB — BASIC METABOLIC PANEL
BUN: 7 mg/dL (ref 6–23)
GFR calc non Af Amer: 85 mL/min — ABNORMAL LOW (ref >90)
Glucose, Bld: 236 mg/dL — ABNORMAL HIGH (ref 70–99)
Potassium: 3.9 mEq/L (ref 3.5–5.1)

## 2012-05-06 LAB — CREATININE, SERUM
GFR calc Af Amer: >90 mL/min (ref >90)
GFR calc non Af Amer: 86 mL/min — ABNORMAL LOW (ref >90)

## 2012-05-06 NOTE — Consult Note (Signed)
Patient Identification:  [name] Date of Evaluation:  05/06/2012  Reason for Consult:  Depression, review Medications  Past Medical History:     Past Medical History  Diagnosis Date  . Coronary artery disease   . Hypertension   . Depression   . Diabetes mellitus without complication   . Stroke 72 years old  . GERD (gastroesophageal reflux disease)     hx of  . Arthritis     hx of  . Erectile dysfunction   . Atrial fibrillation        Past Surgical History  Procedure Laterality Date  . Coronary artery bypass graft    . Eye surgery      cataracts bilateral  . Carpal tunnel release      bilateral  . Tonsillectomy    . Penile prosthesis implant  03/15/2012    Procedure: PENILE PROTHESIS INFLATABLE;  Surgeon: Lindaann Slough, MD;  Location: [facility] ORS;  Service: Urology;  Laterality: N/A;  . Circumcision  03/15/2012    Procedure: CIRCUMCISION ADULT;  Surgeon: Lindaann Slough, MD;  Location: [facility] ORS;  Service: Urology;  Laterality: N/A;  . Esophagogastroduodenoscopy N/A 04/06/2012    Procedure: ESOPHAGOGASTRODUODENOSCOPY (EGD);  Surgeon: Graylin Shiver, MD;  Location: [facility] ENDOSCOPY;  Service: Endoscopy;  Laterality: N/A;  . Colonoscopy N/A 04/14/2012    Procedure: COLONOSCOPY;  Surgeon: Barrie Folk, MD;  Location: [facility] ENDOSCOPY;  Service: Endoscopy;  Laterality: N/A;  . Gastric resection N/A 04/15/2012    Procedure: Partial Gastrectomy;  Surgeon: Cherylynn Ridges, MD;  Location: [facility] OR;  Service: General;  Laterality: N/A;    Allergies: No Known Allergies  Current Medications:  Prior to Admission medications   Medication Sig Start Date End Date Taking? Authorizing Provider  amLODipine (NORVASC) 10 MG tablet Take 1 tablet (10 mg total) by mouth daily. 04/23/12  Yes Srikar Cherlynn Kaiser, MD  atorvastatin (LIPITOR) 40 MG tablet Take 40 mg by mouth daily.   Yes Historical Provider, MD  Cholecalciferol (VITAMIN D-3) 5000 UNITS TABS Take 5,000 Units by mouth daily.   Yes Historical Provider, MD   clopidogrel (PLAVIX) 75 MG tablet Take 75 mg by mouth daily.   Yes Historical Provider, MD  feeding supplement (RESOURCE BREEZE) LIQD Take 1 Container by mouth 3 (three) times daily between meals. 04/23/12  Yes Cristal Ford, MD  metFORMIN (GLUCOPHAGE) 1000 MG tablet Take 1,000 mg by mouth 2 (two) times daily with a meal.   Yes Historical Provider, MD  metoprolol tartrate (LOPRESSOR) 25 MG tablet Take 12.5 mg by mouth 2 (two) times daily.   Yes Historical Provider, MD  oxyCODONE-acetaminophen (PERCOCET/ROXICET) 5-325 MG per tablet Take 1-2 tablets by mouth every 6 (six) hours as needed. 04/23/12  Yes Srikar Cherlynn Kaiser, MD  polyethylene glycol (MIRALAX / GLYCOLAX) packet Take 17 g by mouth daily as needed (He needs 1-2 smooth BM per day.). 04/23/12  Yes Cristal Ford, MD  QUEtiapine (SEROQUEL) 200 MG tablet Take 1 tablet (200 mg total) by mouth daily at 8 pm. 04/23/12  Yes Cristal Ford, MD  Tamsulosin HCl (FLOMAX) 0.4 MG CAPS Take 0.4 mg by mouth daily.   Yes Historical Provider, MD  feeding supplement (ENSURE COMPLETE) LIQD Take 237 mLs by mouth 2 (two) times daily between meals. 05/05/12   Belkys A Regalado, MD  Multiple Vitamin (MULTIVITAMIN WITH MINERALS) TABS Take 1 tablet by mouth daily. 05/05/12   Belkys A Regalado, MD  pantoprazole sodium (PROTONIX) 40 mg/20 mL PACK Take  20 mLs (40 mg total) by mouth 2 (two) times daily. 05/05/12   Belkys A Regalado, MD  potassium chloride (KLOR-CON) 20 MEQ packet Take 40 mEq by mouth daily. 05/05/12   Alba Cory, MD    Social History:    reports that he has never smoked. He has never used smokeless tobacco. He reports that he does not drink alcohol or use illicit drugs.   Assessment/Plan: Pt room was empty.  Pt gown was left on bed.  Pt had discharged himself since discharge note had been written earlier without caveat that Consultation/[facility] Psychiatrist needs to see pt before discharge.  Dr. Vernie Murders is notified that pt has been discharged.  Discussion  with Psych CSW afffirms that pt has outpatient psychiatrist and has a pending appointment in near future.  He has had this steady resource to psychiatric care for some years.  He presented to Psych CSW  As coherent and had capacity to follow up on regular routne.   Torry Istre J. Ferol Luz, MD Psychiatrist  05/06/2012 5:32 PM

## 2012-05-06 NOTE — Progress Notes (Signed)
Physical Therapy Treatment Patient Details Name: [name] MRN: [id number] DOB: [birth year] Today's Date: 05/06/2012 Time: 0902-0928 PT Time Calculation (min): 26 min  PT Assessment / Plan / Recommendation Comments on Treatment Session  Pt stated he is not going to any Nursing Home.  States he has friends and support at home.    Follow Up Recommendations  Home health PT;SNF;Supervision/Assistance - 24 hour     Does the patient have the potential to tolerate intense rehabilitation     Barriers to Discharge        Equipment Recommendations  None recommended by PT    Recommendations for Other Services    Frequency Min 3X/week   Plan      Precautions / Restrictions Precautions Precautions: Fall Restrictions Weight Bearing Restrictions: No   Pertinent Vitals/Pain C/o weakness and very low energy level Pt only on liquid diet    Mobility  Bed Mobility Bed Mobility: Supine to Sit;Sit to Supine Supine to Sit: 5: Supervision Sit to Supine: 5: Supervision Details for Bed Mobility Assistance: only requires increased time Transfers Transfers: Sit to Stand;Stand to Sit Sit to Stand: 5: Supervision;4: Min guard Stand to Sit: 5: Supervision;4: Min guard Details for Transfer Assistance: Cues for hand placement and safety when sitting/standing.  Ambulation/Gait Ambulation/Gait Assistance: 4: Min guard;5: Supervision Ambulation Distance (Feet): 300 Feet (x 4 sitting rest breaks) Assistive device: Rolling walker Ambulation/Gait Assistance Details: Pt demon limited activity tolerance and requires freq sitting rest breaks.  Amb pt to stairwell but he needed to sit to rest a few times.  Gait velocity: decreased General Gait Details: patient steady; minimal VCs for upright posture Stairs: Yes Stairs Assistance: 4: Min assist Stairs Assistance Details (indicate cue type and reason): min assist and use of R rail. Limited activity tol;erance.   Stair Management Technique: One rail  Right;Forwards Number of Stairs: 4     PT Goals                                                           progressing    Visit Information  Last PT Received On: 05/06/12 Assistance Needed: +1    Subjective Data  Subjective: I'm going home !  I am not going to a nursing home Patient Stated Goal: to return home with friend.    Cognition    good   Balance   fair  End of Session PT - End of Session Equipment Utilized During Treatment: Gait belt Activity Tolerance: Patient limited by fatigue Patient left: in bed;with bed alarm set   Felecia Shelling  PTA [facility]  Acute  Rehab Pager      [phone]

## 2012-05-06 NOTE — Progress Notes (Signed)
Clinical Social Work Department CLINICAL SOCIAL WORK PSYCHIATRY SERVICE LINE ASSESSMENT 05/06/2012  Patient:  [name]  Account:  [id number]  Admit Date:  04/29/2012  Clinical Social Worker:  Unk Lightning, LCSW  Date/Time:  05/06/2012 11:15 AM Referred by:  Physician  Date referred:  05/06/2012 Reason for Referral  Behavioral Health Issues   Presenting Symptoms/Problems (In the person's/family's own words):   Psych consulted due to depression   Abuse/Neglect/Trauma History (check all that apply)  Denies history   Abuse/Neglect/Trauma Comments:   Psychiatric History (check all that apply)  Outpatient treatment   Psychiatric medications:  Seroquel 200 mg   Current Mental Health Hospitalizations/Previous Mental Health History:   Patient reports he was diagnosed with depression about 5 years ago.   Current provider:   [facility]-Dr. Sandy Salaam and Date:   [place], [place]   Current Medications:   acetaminophen (TYLENOL) oral liquid 160 mg/5 mL, ondansetron (ZOFRAN) IV, ondansetron, polyethylene glycol            . amLODipine  10 mg Oral Daily  . atorvastatin  40 mg Oral Daily  . clopidogrel  75 mg Oral Q breakfast  . enoxaparin (LOVENOX) injection  40 mg Subcutaneous Q24H  . feeding supplement  237 mL Oral BID BM  . feeding supplement  1 Container Oral TID BM  . metoprolol tartrate  12.5 mg Per Tube BID  . multivitamin with minerals  1 tablet Oral Daily  . pantoprazole sodium  40 mg Oral BID  . potassium chloride  40 mEq Oral Daily  . QUEtiapine  200 mg Oral Q24H  . sodium chloride  1,000 mL Intravenous Once  . sodium chloride  3 mL Intravenous Q12H  . tamsulosin  0.4 mg Oral Daily   Previous Impatient Admission/Date/Reason:   Patient denies any hospitalizations   Emotional Health / Current Symptoms    Suicide/Self Harm  None reported   Suicide attempt in the past:   Patient denies any current or previous SI or HI. Patient denies any suicide  attempts in the past.   Other harmful behavior:   None reported   Psychotic/Dissociative Symptoms  None reported   Other Psychotic/Dissociative Symptoms:    Attention/Behavioral Symptoms  Withdrawn   Other Attention / Behavioral Symptoms:   Patient withdrawn during assessment with flat affect.    Cognitive Impairment  Within Normal Limits   Other Cognitive Impairment:    Mood and Adjustment  Flat    Stress, Anxiety, Trauma, Any Recent Loss/Stressor  Other - See comment   Anxiety (frequency):   Phobia (specify):   Compulsive behavior (specify):   Obsessive behavior (specify):   Other:   Patient reports depression has increased since having to have surgery in Feb 2014.   Substance Abuse/Use  None   SBIRT completed (please refer for detailed history):  N  Self-reported substance use:   Patient denies all substance use.   Urinary Drug Screen Completed:  Y Alcohol level:   Negative screen    Environmental/Housing/Living Arrangement  Stable housing   Who is in the home:   Alone   Emergency contact:  Teaching laboratory technician  Medicare   Patient's Strengths and Goals (patient's own words):   Patient reports that friends and family are supportive.   Clinical Social Worker's Interpretive Summary:   CSW received referral to complete psychosocial assessment on patient. CSW reviewed chart and met with patient at bedside. No visitors present during assessment.    CSW introduced myself  and explained role. Patient agreeable to assessment and laying in bed. Patient reports that he lives alone and has one dtr that lives in [place]. Patient has 8 grandchildren and 4 great-grandchildren. Patient reports that he plans to return home with [facility] and support of family. Patient had additional questions regarding HH and requested to speak with CM. CSW called CM and left a message regarding patient's questions.    Patient reports that he was diagnosed with depression about 5  years ago. Patient reports that he feels depression has increased since his surgery in Feb 2014. Patient reports that he is feeling more hopeless and depressed during the days. Patient denies any SI or HI and denies any psychotic features. Patient does feel that depression has affected his ability to sleep and eat properly. Patient reports that he has been taking Seroquel for assistance with sleep and depression but does not feel it is as effective. Patient is interested in speaking with psych MD regarding medication adjustments.    CSW and patient discussed following up with therapy or support groups. Patient reports that he is not interested in follow up but reports he will continue to go to [facility] for medication appointments with MD.    Patient minimally engaged throughout assessment with limited eye contact. Patient able to identify symptoms but resistant to further treatment for symptoms of depression. Patient is not a harm to himself or others at this time and has a good support system. Patient is already connected to a psychiatrist in the community and reports he will make a same day appointment with psychiatrist once he is dc from the hospital.    CSW staffed case with psych MD and will assist with any recommendations provided.   Disposition:  Recommend Psych CSW continuing to support while in hospital

## 2012-05-12 ENCOUNTER — Telehealth: Payer: Self-pay | Admitting: *Deleted

## 2012-05-12 NOTE — Telephone Encounter (Signed)
sw pt gv him his appt for 05/17/12@ 10:30 am.the patient is aware.

## 2012-05-13 ENCOUNTER — Emergency Department (HOSPITAL_COMMUNITY)
Admission: EM | Admit: 2012-05-13 | Discharge: 2012-05-14 | Disposition: A | Discharge status: Home / Self Care | Payer: Medicare Other | Attending: Emergency Medicine | Admitting: Emergency Medicine

## 2012-05-13 ENCOUNTER — Encounter (HOSPITAL_COMMUNITY): Payer: Self-pay | Admitting: *Deleted

## 2012-05-13 ENCOUNTER — Telehealth: Payer: Self-pay | Admitting: Dietician

## 2012-05-13 DIAGNOSIS — F329 Major depressive disorder, single episode, unspecified: Secondary | ICD-10-CM | POA: Insufficient documentation

## 2012-05-13 DIAGNOSIS — E162 Hypoglycemia, unspecified: Secondary | ICD-10-CM | POA: Insufficient documentation

## 2012-05-13 DIAGNOSIS — I10 Essential (primary) hypertension: Secondary | ICD-10-CM | POA: Insufficient documentation

## 2012-05-13 DIAGNOSIS — E119 Type 2 diabetes mellitus without complications: Secondary | ICD-10-CM | POA: Insufficient documentation

## 2012-05-13 DIAGNOSIS — Z8739 Personal history of other diseases of the musculoskeletal system and connective tissue: Secondary | ICD-10-CM | POA: Insufficient documentation

## 2012-05-13 DIAGNOSIS — R5381 Other malaise: Secondary | ICD-10-CM | POA: Insufficient documentation

## 2012-05-13 DIAGNOSIS — Z8679 Personal history of other diseases of the circulatory system: Secondary | ICD-10-CM | POA: Insufficient documentation

## 2012-05-13 DIAGNOSIS — Z8719 Personal history of other diseases of the digestive system: Secondary | ICD-10-CM | POA: Insufficient documentation

## 2012-05-13 DIAGNOSIS — R011 Cardiac murmur, unspecified: Secondary | ICD-10-CM | POA: Insufficient documentation

## 2012-05-13 DIAGNOSIS — I2510 Atherosclerotic heart disease of native coronary artery without angina pectoris: Secondary | ICD-10-CM | POA: Insufficient documentation

## 2012-05-13 DIAGNOSIS — Z951 Presence of aortocoronary bypass graft: Secondary | ICD-10-CM | POA: Insufficient documentation

## 2012-05-13 DIAGNOSIS — Z79899 Other long term (current) drug therapy: Secondary | ICD-10-CM | POA: Insufficient documentation

## 2012-05-13 DIAGNOSIS — R5383 Other fatigue: Secondary | ICD-10-CM | POA: Insufficient documentation

## 2012-05-13 DIAGNOSIS — Z87448 Personal history of other diseases of urinary system: Secondary | ICD-10-CM | POA: Insufficient documentation

## 2012-05-13 DIAGNOSIS — F3289 Other specified depressive episodes: Secondary | ICD-10-CM | POA: Insufficient documentation

## 2012-05-13 DIAGNOSIS — Z8673 Personal history of transient ischemic attack (TIA), and cerebral infarction without residual deficits: Secondary | ICD-10-CM | POA: Insufficient documentation

## 2012-05-13 LAB — CBC WITH DIFFERENTIAL/PLATELET
Basophils Absolute: 0.1 10*3/uL (ref 0.0–0.1)
Basophils Relative: 0 % (ref 0–1)
HCT: 31.7 % — ABNORMAL LOW (ref 39.0–52.0)
Lymphocytes Relative: 20 % (ref 12–46)
MCHC: 32.8 g/dL (ref 30.0–36.0)
Monocytes Absolute: 0.9 10*3/uL (ref 0.1–1.0)
Neutro Abs: 9.5 10*3/uL — ABNORMAL HIGH (ref 1.7–7.7)
Neutrophils Relative %: 69 % (ref 43–77)
Platelets: 318 10*3/uL (ref 150–400)
RDW: 15.4 % (ref 11.5–15.5)
WBC: 13.8 10*3/uL — ABNORMAL HIGH (ref 4.0–10.5)

## 2012-05-13 LAB — GLUCOSE, CAPILLARY
Glucose-Capillary: 51 mg/dL — ABNORMAL LOW (ref 70–99)
Glucose-Capillary: 80 mg/dL (ref 70–99)

## 2012-05-13 LAB — COMPREHENSIVE METABOLIC PANEL
ALT: 7 U/L (ref 0–53)
Alkaline Phosphatase: 94 U/L (ref 39–117)
BUN: 18 mg/dL (ref 6–23)
CO2: 20 mEq/L (ref 19–32)
GFR calc Af Amer: 61 mL/min — ABNORMAL LOW (ref >90)
GFR calc non Af Amer: 53 mL/min — ABNORMAL LOW (ref >90)
Glucose, Bld: 52 mg/dL — ABNORMAL LOW (ref 70–99)
Potassium: 4.1 mEq/L (ref 3.5–5.1)
Total Protein: 6.8 g/dL (ref 6.0–8.3)

## 2012-05-13 LAB — URINE MICROSCOPIC-ADD ON

## 2012-05-13 LAB — URINALYSIS, ROUTINE W REFLEX MICROSCOPIC
Nitrite: NEGATIVE
Specific Gravity, Urine: 1.022 (ref 1.005–1.030)
Urobilinogen, UA: 1 mg/dL (ref 0.0–1.0)
pH: 5 (ref 5.0–8.0)

## 2012-05-13 MED ORDER — SODIUM CHLORIDE 0.9 % IV BOLUS (SEPSIS)
1000.0000 mL | Freq: Once | INTRAVENOUS | Status: AC
  Administered 2012-05-13: 1000 mL via INTRAVENOUS

## 2012-05-13 MED ORDER — DEXTROSE 50 % IV SOLN
INTRAVENOUS | Status: AC
  Administered 2012-05-13: 50 mL
  Filled 2012-05-13: qty 50

## 2012-05-13 MED ORDER — ONDANSETRON HCL 4 MG/2ML IJ SOLN
4.0000 mg | Freq: Once | INTRAMUSCULAR | Status: AC
  Administered 2012-05-13: 4 mg via INTRAVENOUS
  Filled 2012-05-13: qty 2

## 2012-05-13 NOTE — ED Notes (Signed)
Pt stated he was feeling nauseous.  MD made aware.  Verbal order given for 4 of Zofran.  Pt also given broth.  Informed pt that it was important that he attempt to eat.

## 2012-05-13 NOTE — ED Notes (Signed)
Molly Maduro (friend) states to call him if pt going home.  [phone] or [phone]

## 2012-05-13 NOTE — ED Notes (Signed)
Was d/c'd from [facility] yesterday. PT to ED c/o emesis yesterday.  States blood sugar in 40's.  Presently 51.  Pt AO x 4.

## 2012-05-14 NOTE — ED Provider Notes (Signed)
History     CSN: [id number]  Arrival date & time 05/13/12  1830   First MD Initiated Contact with Patient 05/13/12 1849      Chief Complaint  Patient presents with  . Hypoglycemia    (Consider location/radiation/quality/duration/timing/severity/associated sxs/prior treatment) HPI Comments: Pt with hx of diabetes, CAD, strokes comes in with cc of hypoglycemia. States that home health nurse noted that his sugar was low, and askesd him to come to the ER. Pt has no recent infection. Pt denies nausea, emesis, fevers, chills, chest pains, shortness of breath, headaches, abdominal pain, uti like symptoms. He does admit to decreased po intake - just because he has no appetite - and continued metformin use. Pt has no diarrhea.    The history is provided by the patient and medical records.    Past Medical History  Diagnosis Date  . Coronary artery disease   . Hypertension   . Depression   . Diabetes mellitus without complication   . Stroke 72 years old  . GERD (gastroesophageal reflux disease)     hx of  . Arthritis     hx of  . Erectile dysfunction   . Atrial fibrillation     Past Surgical History  Procedure Laterality Date  . Coronary artery bypass graft    . Eye surgery      cataracts bilateral  . Carpal tunnel release      bilateral  . Tonsillectomy    . Penile prosthesis implant  03/15/2012    Procedure: PENILE PROTHESIS INFLATABLE;  Surgeon: Lindaann Slough, MD;  Location: [facility] ORS;  Service: Urology;  Laterality: N/A;  . Circumcision  03/15/2012    Procedure: CIRCUMCISION ADULT;  Surgeon: Lindaann Slough, MD;  Location: [facility] ORS;  Service: Urology;  Laterality: N/A;  . Esophagogastroduodenoscopy N/A 04/06/2012    Procedure: ESOPHAGOGASTRODUODENOSCOPY (EGD);  Surgeon: Graylin Shiver, MD;  Location: [facility] ENDOSCOPY;  Service: Endoscopy;  Laterality: N/A;  . Colonoscopy N/A 04/14/2012    Procedure: COLONOSCOPY;  Surgeon: Barrie Folk, MD;  Location: [facility] ENDOSCOPY;  Service:  Endoscopy;  Laterality: N/A;  . Gastric resection N/A 04/15/2012    Procedure: Partial Gastrectomy;  Surgeon: Cherylynn Ridges, MD;  Location: [facility] OR;  Service: General;  Laterality: N/A;    No family history on file.  History  Substance Use Topics  . Smoking status: Never Smoker   . Smokeless tobacco: Never Used  . Alcohol Use: No      Review of Systems  Constitutional: Positive for fatigue. Negative for fever, chills and activity change.  HENT: Negative for neck pain.   Eyes: Negative for visual disturbance.  Respiratory: Negative for cough, chest tightness and shortness of breath.   Cardiovascular: Negative for chest pain.  Gastrointestinal: Negative for abdominal distention.  Genitourinary: Negative for dysuria, enuresis and difficulty urinating.  Musculoskeletal: Negative for arthralgias.  Neurological: Negative for dizziness, seizures, light-headedness and headaches.  Psychiatric/Behavioral: Negative for confusion.    Allergies  Morphine and related  Home Medications   Current Outpatient Rx  Name  Route  Sig  Dispense  Refill  . amLODipine (NORVASC) 10 MG tablet   Oral   Take 1 tablet (10 mg total) by mouth daily.   30 tablet   0   . atorvastatin (LIPITOR) 40 MG tablet   Oral   Take 40 mg by mouth daily.         . Cholecalciferol (VITAMIN D-3) 5000 UNITS TABS   Oral   Take 1 tablet  by mouth daily.         . clopidogrel (PLAVIX) 75 MG tablet   Oral   Take 75 mg by mouth daily.         . feeding supplement (ENSURE COMPLETE) LIQD   Oral   Take 237 mLs by mouth 2 (two) times daily between meals.   30 Bottle   2   . feeding supplement (RESOURCE BREEZE) LIQD   Oral   Take 1 Container by mouth 2 (two) times daily between meals.         . metFORMIN (GLUCOPHAGE) 1000 MG tablet   Oral   Take 1,000 mg by mouth 2 (two) times daily with a meal.         . metoprolol tartrate (LOPRESSOR) 25 MG tablet   Oral   Take 12.5 mg by mouth 2 (two) times  daily.         Marland Kitchen oxyCODONE-acetaminophen (PERCOCET/ROXICET) 5-325 MG per tablet   Oral   Take 1-2 tablets by mouth every 6 (six) hours as needed.   20 tablet   0   . polyethylene glycol (MIRALAX / GLYCOLAX) packet   Oral   Take 17 g by mouth daily as needed (He needs 1-2 smooth BM per day.).   60 each   0   . potassium chloride (KLOR-CON) 20 MEQ packet   Oral   Take 40 mEq by mouth 2 (two) times daily.         . QUEtiapine (SEROQUEL) 200 MG tablet   Oral   Take 1 tablet (200 mg total) by mouth daily at 8 pm.         . Tamsulosin HCl (FLOMAX) 0.4 MG CAPS   Oral   Take 0.4 mg by mouth daily.           BP 107/84  Pulse 79  Temp(Src) 98.2 F (36.8 C) (Oral)  Resp 16  SpO2 96%  Physical Exam  Nursing note and vitals reviewed. Constitutional: He is oriented to person, place, and time. He appears well-developed.  HENT:  Head: Normocephalic and atraumatic.  Eyes: Conjunctivae and EOM are normal. Pupils are equal, round, and reactive to light.  Neck: Normal range of motion. Neck supple.  Cardiovascular: Normal rate and regular rhythm.   Murmur heard. Pulmonary/Chest: Effort normal and breath sounds normal.  Abdominal: Soft. Bowel sounds are normal. He exhibits no distension. There is no tenderness. There is no rebound and no guarding.  Neurological: He is alert and oriented to person, place, and time.  Skin: Skin is warm.    ED Course  Procedures (including critical care time)  Labs Reviewed  GLUCOSE, CAPILLARY - Abnormal; Notable for the following:    Glucose-Capillary 51 (*)    All other components within normal limits  COMPREHENSIVE METABOLIC PANEL - Abnormal; Notable for the following:    Glucose, Bld 52 (*)    Albumin 2.6 (*)    GFR calc non Af Amer 53 (*)    GFR calc Af Amer 61 (*)    All other components within normal limits  CBC WITH DIFFERENTIAL - Abnormal; Notable for the following:    WBC 13.8 (*)    RBC 3.70 (*)    Hemoglobin 10.4 (*)     HCT 31.7 (*)    Neutro Abs 9.5 (*)    All other components within normal limits  URINALYSIS, ROUTINE W REFLEX MICROSCOPIC - Abnormal; Notable for the following:    APPearance CLOUDY (*)  Bilirubin Urine SMALL (*)    Ketones, ur 15 (*)    Leukocytes, UA SMALL (*)    All other components within normal limits  GLUCOSE, CAPILLARY - Abnormal; Notable for the following:    Glucose-Capillary 181 (*)    All other components within normal limits  URINE MICROSCOPIC-ADD ON - Abnormal; Notable for the following:    Casts HYALINE CASTS (*)    All other components within normal limits  GLUCOSE, CAPILLARY  GLUCOSE, CAPILLARY  GLUCOSE, CAPILLARY   No results found.   1. Hypoglycemia       MDM   Date: 05/14/2012  Rate: 92  Rhythm: normal sinus rhythm  QRS Axis: normal  Intervals: normal  ST/T Wave abnormalities: nonspecific ST/T changes  Conduction Disutrbances:right bundle branch block  Narrative Interpretation:   Old EKG Reviewed: unchanged  DDx: Sepsis syndrome ACS syndrome DKA ICH Stroke CHF exacerbation COPD exacerbation Infection - pneumonia/UTI/Cellulitis PE Dehydration Electrolyte abnormality Tox syndrome  Pt comes in with cc of hypoglycemia. Pt's exam and hx not suggestive of any infection. No hx of liber dz. Appears to be from decreased po intake. Will get screening labs regardless. Will get serial CBG.      Derwood Kaplan, MD 05/14/12 0981

## 2012-05-16 ENCOUNTER — Telehealth (INDEPENDENT_AMBULATORY_CARE_PROVIDER_SITE_OTHER): Payer: Self-pay

## 2012-05-16 NOTE — Telephone Encounter (Signed)
The pt called reporting he has some leaking from where the surgery was done.  He couldn't give me specifics on the location.  It is draining bloody drainage.  It is not pus.  He said it was doing that in the hospital.  He has no fever.  He couldn't tell me if it's inflamed around it on the skin.  He said someone will be there soon to look at it and he can tell me.   He has an appointment scheduled on 4/3. I told him I could advise just to shower, keep it clean and covered and come to the appointment on 4/3.  He will do that and call me back if it is red and inflamed.

## 2012-05-17 ENCOUNTER — Ambulatory Visit (HOSPITAL_BASED_OUTPATIENT_CLINIC_OR_DEPARTMENT_OTHER): Payer: Medicare Other | Admitting: Oncology

## 2012-05-17 ENCOUNTER — Telehealth: Payer: Self-pay | Admitting: *Deleted

## 2012-05-17 ENCOUNTER — Ambulatory Visit (HOSPITAL_BASED_OUTPATIENT_CLINIC_OR_DEPARTMENT_OTHER): Payer: Medicare Other

## 2012-05-17 ENCOUNTER — Encounter: Payer: Self-pay | Admitting: Oncology

## 2012-05-17 ENCOUNTER — Other Ambulatory Visit (HOSPITAL_BASED_OUTPATIENT_CLINIC_OR_DEPARTMENT_OTHER): Payer: Medicare Other | Admitting: Lab

## 2012-05-17 VITALS — BP 106/61 | HR 83 | Temp 98.2°F | Resp 20

## 2012-05-17 VITALS — BP 74/46 | HR 93 | Temp 97.7°F | Resp 18 | Ht 66.0 in | Wt 153.4 lb

## 2012-05-17 DIAGNOSIS — D649 Anemia, unspecified: Secondary | ICD-10-CM

## 2012-05-17 DIAGNOSIS — C494 Malignant neoplasm of connective and soft tissue of abdomen: Secondary | ICD-10-CM

## 2012-05-17 DIAGNOSIS — I959 Hypotension, unspecified: Secondary | ICD-10-CM

## 2012-05-17 DIAGNOSIS — R1902 Left upper quadrant abdominal swelling, mass and lump: Secondary | ICD-10-CM

## 2012-05-17 DIAGNOSIS — IMO0002 Reserved for concepts with insufficient information to code with codable children: Secondary | ICD-10-CM

## 2012-05-17 DIAGNOSIS — E119 Type 2 diabetes mellitus without complications: Secondary | ICD-10-CM

## 2012-05-17 DIAGNOSIS — R5381 Other malaise: Secondary | ICD-10-CM

## 2012-05-17 LAB — CBC WITH DIFFERENTIAL/PLATELET
Basophils Absolute: 0.1 10*3/uL (ref 0.0–0.1)
Eosinophils Absolute: 0.4 10*3/uL (ref 0.0–0.5)
HGB: 10 g/dL — ABNORMAL LOW (ref 13.0–17.1)
LYMPH%: 21.3 % (ref 14.0–49.0)
MCV: 86.6 fL (ref 79.3–98.0)
MONO#: 0.8 10*3/uL (ref 0.1–0.9)
NEUT#: 7.4 10*3/uL — ABNORMAL HIGH (ref 1.5–6.5)
Platelets: 331 10*3/uL (ref 140–400)
RBC: 3.59 10*6/uL — ABNORMAL LOW (ref 4.20–5.82)
RDW: 15.3 % — ABNORMAL HIGH (ref 11.0–14.6)
WBC: 10.9 10*3/uL — ABNORMAL HIGH (ref 4.0–10.3)

## 2012-05-17 LAB — COMPREHENSIVE METABOLIC PANEL (CC13)
Albumin: 2.4 g/dL — ABNORMAL LOW (ref 3.5–5.0)
BUN: 13.9 mg/dL (ref 7.0–26.0)
CO2: 21 mEq/L — ABNORMAL LOW (ref 22–29)
Glucose: 180 mg/dl — ABNORMAL HIGH (ref 70–99)
Potassium: 4 mEq/L (ref 3.5–5.1)
Sodium: 139 mEq/L (ref 136–145)
Total Protein: 6.3 g/dL — ABNORMAL LOW (ref 6.4–8.3)

## 2012-05-17 MED ORDER — SODIUM CHLORIDE 0.9 % IV SOLN
1000.0000 mL | Freq: Once | INTRAVENOUS | Status: AC
  Administered 2012-05-17: 1000 mL via INTRAVENOUS

## 2012-05-17 MED ORDER — FAMOTIDINE IN NACL 20-0.9 MG/50ML-% IV SOLN
20.0000 mg | Freq: Once | INTRAVENOUS | Status: AC
  Administered 2012-05-17: 20 mg via INTRAVENOUS

## 2012-05-17 NOTE — Progress Notes (Signed)
1415 -  Pt c/o acid reflux.  Dr. Truett Perna notified of pt's lab results and pt's symptoms.   Order received for Pepcid IV to be given.  Explanations given to pt. 1530  -   Dr. Truett Perna assessed pt in infusion room.   Mid Abdomen small opened wounds were assessed by md.   Both open wounds noted with red tissues present inside wounds.  Wounds cleansed with normal saline and redressed with sterile 4x4 gauze.   Pt has appt with the surgeon on 05/19/12.  Dr. Truett Perna aware.

## 2012-05-17 NOTE — Progress Notes (Signed)
   [facility]    OFFICE PROGRESS NOTE   INTERVAL HISTORY:   He was admitted on March 14 with failure to thrive and acute renal failure. 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 He was evaluated by gastroenterology and surgery while in the hospital and a full liquid diet was recommended. He was discharged on 05/05/2012.  He presents today with his caretaker. He complains of solid dysphagia. His appetite remains poor. The blood sugar was measured at approximately 150 this morning. The friend reports Mr. Formica is intermittently lethargic. There was bleeding from the abdominal wound yesterday.  Objective:  Vital signs in last 24 hours:  Blood pressure 74/46, pulse 93, temperature 97.7 F (36.5 C), temperature source Oral, resp. rate 18, height 5\' 6"  (1.676 m), weight 153 lb 6.4 oz (69.582 kg).    HEENT: The mucous membranes are moist Resp: Lungs clear bilaterally Cardio: Regular rate and rhythm GI: No hepatomegaly, nontender, small open area at the upper portion of the midline incision with oozing of blood. Small amount. 1 drainage at an opening over the lower portion of the midline incision. No surrounding erythema. Vascular: No leg edema Neuro: Lethargic, arousable, follows commands, oriented to place       Lab Results:  Lab Results  Component Value Date   WBC 10.9* 05/17/2012   HGB 10.0* 05/17/2012   HCT 31.1* 05/17/2012   MCV 86.6 05/17/2012   PLT 331 05/17/2012   potassium 4.0, BUN 13.9, creatinine 1.2, calcium 8.4, albumin 2.4, glucose 180    Medications: I have reviewed the patient's current medications.  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 Diabetes  3. History of coronary artery disease  4. Chronic renal failure  5. Admission with gastrointestinal bleeding and severe anemia 04/05/2012  6.  Hypotension-he will receive intravenous fluids in the chemotherapy room today. We will reevaluate the blood pressure after she receives IV fluids. We asked him to hold the amlodipine, Lopressor, and Flomax 7.? Wound infection-we will contact Dr. Lindie Spruce. He has an appointment with Dr. Lindie Spruce on 05/19/2012.   Disposition:  He appears weak and lethargic. I am concerned he is unable to maintain adequate nutrition with the gastric stricture. I will contact Dr. Lindie Spruce. Mr. Bates is not a candidate for Gleevec in his current condition. He may need to undergo placement of a feeding tube or begin home TNA.   Addendum: I have evaluated him after he received intravenous fluids. The blood pressure was improved and he appeared more alert. We will arrange for followup with Dr. Julio Sicks. I discussed the situation with his daughter by telephone.   Thornton Papas, MD  05/17/2012  4:37 PM

## 2012-05-17 NOTE — Progress Notes (Signed)
05/17/12 10:44 note entered in error.

## 2012-05-17 NOTE — Progress Notes (Signed)
Patient is weak, lethargic and hypotensive upon presentation to office. Family friend reports CBG 180's this morning.

## 2012-05-17 NOTE — Patient Instructions (Signed)
Dehydration, Adult Dehydration is when you lose more fluids from the body than you take in. Vital organs like the kidneys, brain, and heart cannot function without a proper amount of fluids and salt. Any loss of fluids from the body can cause dehydration.  CAUSES   Vomiting.  Diarrhea.  Excessive sweating.  Excessive urine output.  Fever. SYMPTOMS  Mild dehydration  Thirst.  Dry lips.  Slightly dry mouth. Moderate dehydration  Very dry mouth.  Sunken eyes.  Skin does not bounce back quickly when lightly pinched and released.  Dark urine and decreased urine production.  Decreased tear production.  Headache. Severe dehydration  Very dry mouth.  Extreme thirst.  Rapid, weak pulse (more than 100 beats per minute at rest).  Cold hands and feet.  Not able to sweat in spite of heat and temperature.  Rapid breathing.  Blue lips.  Confusion and lethargy.  Difficulty being awakened.  Minimal urine production.  No tears. DIAGNOSIS  Your caregiver will diagnose dehydration based on your symptoms and your exam. Blood and urine tests will help confirm the diagnosis. The diagnostic evaluation should also identify the cause of dehydration. TREATMENT  Treatment of mild or moderate dehydration can often be done at home by increasing the amount of fluids that you drink. It is best to drink small amounts of fluid more often. Drinking too much at one time can make vomiting worse. Refer to the home care instructions below. Severe dehydration needs to be treated at the hospital where you will probably be given intravenous (IV) fluids that contain water and electrolytes. HOME CARE INSTRUCTIONS   Ask your caregiver about specific rehydration instructions.  Drink enough fluids to keep your urine clear or pale yellow.  Drink small amounts frequently if you have nausea and vomiting.  Eat as you normally do.  Avoid:  Foods or drinks high in sugar.  Carbonated  drinks.  Juice.  Extremely hot or cold fluids.  Drinks with caffeine.  Fatty, greasy foods.  Alcohol.  Tobacco.  Overeating.  Gelatin desserts.  Wash your hands well to avoid spreading bacteria and viruses.  Only take over-the-counter or prescription medicines for pain, discomfort, or fever as directed by your caregiver.  Ask your caregiver if you should continue all prescribed and over-the-counter medicines.  Keep all follow-up appointments with your caregiver. SEEK MEDICAL CARE IF:  You have abdominal pain and it increases or stays in one area (localizes).  You have a rash, stiff neck, or severe headache.  You are irritable, sleepy, or difficult to awaken.  You are weak, dizzy, or extremely thirsty. SEEK IMMEDIATE MEDICAL CARE IF:   You are unable to keep fluids down or you get worse despite treatment.  You have frequent episodes of vomiting or diarrhea.  You have blood or green matter (bile) in your vomit.  You have blood in your stool or your stool looks black and tarry.  You have not urinated in 6 to 8 hours, or you have only urinated a small amount of very dark urine.  You have a fever.  You faint. MAKE SURE YOU:   Understand these instructions.  Will watch your condition.  Will get help right away if you are not doing well or get worse. Document Released: 02/02/2005 Document Revised: 04/27/2011 Document Reviewed: 09/22/2010 [employer] Patient Information 2013 [employer].  

## 2012-05-17 NOTE — Telephone Encounter (Signed)
Made daughter aware that Dr. Truett Perna wants [name] to stop his Norvasc, Lopressor and Flomax due to low BP. Needs to get stronger before he can begin the chemo pills for his cancer. Needs to see Dr. Kathaleen Bury this week also. Faxed office note, labs and request to be seen to PCP. Daughter will call for appointment.

## 2012-05-17 NOTE — Progress Notes (Deleted)
Met with patient and his daughters to check in and assess for needs.  He denies needing any assist at this time.  He is driving himself to daily XRT.  He states he is eating and has had a pretty good appetite.  He stated he has an excellent support system.  Will continue to follow as needed.

## 2012-05-18 ENCOUNTER — Ambulatory Visit: Payer: Medicare Other | Admitting: Nurse Practitioner

## 2012-05-19 ENCOUNTER — Ambulatory Visit (INDEPENDENT_AMBULATORY_CARE_PROVIDER_SITE_OTHER): Payer: Medicare Other | Admitting: General Surgery

## 2012-05-19 ENCOUNTER — Encounter (INDEPENDENT_AMBULATORY_CARE_PROVIDER_SITE_OTHER): Payer: Self-pay | Admitting: General Surgery

## 2012-05-19 VITALS — BP 128/76 | HR 78 | Temp 98.2°F | Resp 18

## 2012-05-19 DIAGNOSIS — R490 Dysphonia: Secondary | ICD-10-CM

## 2012-05-19 NOTE — Progress Notes (Signed)
The patient comes in today after having had 2 different hospitalizations for failure to thrive, gastric outlet obstruction, and recent dehydration with malnutrition. The initial admission left to the patient being on parenteral nutrition for a while until he is able to demonstrate that he can hold down fluids. The patient has been on nothing but fluids since he's been home.   On examination the patient has a 3-4 cm long blistering area of his midline incision with no extravasation of pus. He has no abdominal tenderness and has good bowel sounds. It is noticeable that the patient is markedly hoarse. The patient states that this is been the case since he was intubated at the first time of his hospitalization.  The patient has a good appetite and would like to try solid food. I've advised him to go ahead and start taking solid foods at least 6-8 meals per day in small quantities. The food should be no more than ground beef quality and soft.he should avoid using large fruits and salads it might block of his distal gastric obstruction. If his symptoms persist where he's not able to provide adequate nutrition to himself then will possibly have to convert his partial distal gastrectomy to a Billroth II or wall Y. Gastrojejunostomy appear  For his hoarseness I am referring him to [facility], Dr. Emeline Darling. All see the patient back in 2-3 weeks. All like to get him wait today to compare to his weight and he is seen again in my office.

## 2012-05-24 ENCOUNTER — Telehealth (INDEPENDENT_AMBULATORY_CARE_PROVIDER_SITE_OTHER): Payer: Self-pay | Admitting: General Surgery

## 2012-05-24 NOTE — Telephone Encounter (Signed)
This patient has a appt  With DR Emeline Darling 05/25/12 at  340pm  I have called his voice mail is  Full

## 2012-05-24 NOTE — Telephone Encounter (Signed)
Patient is aware of appt tomorrow with Dr [facility] ENT  05/25/12

## 2012-05-25 ENCOUNTER — Telehealth: Payer: Self-pay | Admitting: Dietician

## 2012-05-25 NOTE — Telephone Encounter (Signed)
Brief Outpatient Oncology Nutrition Note  Patient has been identified to be at risk on malnutrition screen.   Wt Readings from Last 10 Encounters:  05/17/12 153 lb 6.4 oz (69.582 kg)  05/04/12 159 lb 2.8 oz (72.2 kg)  05/04/12 159 lb 2.8 oz (72.2 kg)  04/29/12 160 lb 4.8 oz (72.712 kg)  04/15/12 189 lb (85.73 kg)  04/15/12 189 lb (85.73 kg)  04/15/12 189 lb (85.73 kg)  04/15/12 189 lb (85.73 kg)  04/15/12 189 lb (85.73 kg)  03/15/12 183 lb 6.8 oz (83.2 kg)   Patient with progressive weight loss.  Saw patient during admit 1 month ago.  Patient dx with malnutrition at that time.    Called patient.  Voice mailbox is full.  Will refer [facility].  Oran Rein, RD, LDN

## 2012-05-31 ENCOUNTER — Encounter (INDEPENDENT_AMBULATORY_CARE_PROVIDER_SITE_OTHER): Payer: Self-pay | Admitting: General Surgery

## 2012-05-31 ENCOUNTER — Ambulatory Visit (INDEPENDENT_AMBULATORY_CARE_PROVIDER_SITE_OTHER): Payer: Medicare Other | Admitting: General Surgery

## 2012-05-31 VITALS — BP 142/66 | HR 84 | Temp 97.1°F | Resp 20 | Ht 66.0 in | Wt 159.0 lb

## 2012-05-31 DIAGNOSIS — Z09 Encounter for follow-up examination after completed treatment for conditions other than malignant neoplasm: Secondary | ICD-10-CM

## 2012-05-31 NOTE — Progress Notes (Signed)
The patient comes in today looking a lot stronger. His weight is up to 159 pounds. The patient states that he is eating better particularly liquids. He continues to take boost and Ensure.  His midline wound looks well with no evidence of acute infection. The small blistered area in the upper midportion of his incision has opened up and drained a small amount of serosanguineous fluid. There is no fascial defect and no hernia.  On examination his wound is healing well with no infection. He has no hernia. He has good normal active bowel sounds.  The patient is no longer acutely dehydrated all as malnourished as they have been before. Although his diet is not quite up to what we would wanted to be currently, he is doing better and seems to be on the road to recovery. I have still suggested that the patient needed a frequent basis small amounts and gradually increase his solid input.  I could get a swallowing study on the patient however this was not necessary change my management if it shows that he is emptying. If he should start having more symptoms of blockage and or nausea vomiting I will possibly get an upper GI study. I will see the patient again in one month.

## 2012-07-05 ENCOUNTER — Encounter: Payer: Self-pay | Admitting: Neurology

## 2012-07-05 ENCOUNTER — Ambulatory Visit (INDEPENDENT_AMBULATORY_CARE_PROVIDER_SITE_OTHER): Payer: Medicare Other | Admitting: Neurology

## 2012-07-05 VITALS — BP 175/92 | HR 86 | Ht 65.5 in | Wt 164.0 lb

## 2012-07-05 DIAGNOSIS — R413 Other amnesia: Secondary | ICD-10-CM

## 2012-07-05 DIAGNOSIS — I519 Heart disease, unspecified: Secondary | ICD-10-CM

## 2012-07-05 NOTE — Progress Notes (Signed)
HPI:  [name] is alone at visit for following up of memory loss.  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 He also has left the stove on, or has forgotten to close his garage door , or the car door,  he also does his own finances without much difficulties. He also owns 12 rental objects in [place], and does the finances for those all  by himself  The patient has a past medical history of diabetes, hypertension and heart disease. His diabetes is uncontrolled , his last A1c was  9.1%. He states that  he forgets to take his medication sometimes. He is on  4 meds for his diabetes.    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 He also had acute onset of diplopia in June 2013, presented to [facility] emergency room,, MRI of the brain with and without contrast, has demonstrated chronic moderate periventricular white matter disease, no acute lesions. Laboratory evaluation has demonstrated mild elevated creatinine 1.4, glucose of 117, WBC 13.7, he denies vertigo, lateralized motor or sensory deficit. 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 12. 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   He was admitted on March 14 with failure to thrive and acute renal failure. 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 He was evaluated by gastroenterology and surgery while in the hospital and had transient parenteral nutrition,  He is now discharge home, his daughter, and his son-in-law came to check on him regularly,  Review of Systems  Out of a complete 14 system review, the patient complains of only the following symptoms, and all other reviewed systems are negative.   Constitutional:  Generalized weakness , poor appetite, fatigue  Cardiovascular:  N/A Ear/Nose/Throat:  N/A Skin: N/A Eyes: N/A Respiratory: N/A Gastroitestinal: N/A    Hematology/Lymphatic:  N/A Endocrine:  N/A Musculoskeletal:N/A Allergy/Immunology: N/A Neurological: N/A Psychiatric:    N/A   Physical Exam  General: Patient is well developed and well groomed. Patient is tired looking Head: Symmetric facial features.  Neck: supple no carotid bruits Respiratory: clear to auscultation bilaterally Cardiovascular: regular rate rhythm Skin: No rash, no bruising  Neurologic Exam  Mental Status: tired looking elderly male, cooperative to history, talking, and  casual conversation. MMSE 22/30 , he has difficulty spell world backwards, could not write a sentence, or copy design, missed 1 out of 3 recall. Cranial Nerves: CN II-XII pupils were equal round reactive to light. Visual fields were full on confrontational test.   Facial sensation and strength were normal.  Hearing was intact to finger rubbing bilaterally.  Uvula tongue were midline.  Head turning and shoulder shrugging were normal and symmetric.  Tongue protrusion into the cheeks strength were normal.  Motor: Normal tone, bulk, and strength. Sensory: Intact and symmetric to light touch Coordination: Normal finger-to-nose, heel-to-shin.  There was no dysmetria noticed. Gait and Station: Narrow based gait,  Mildly atalgic  Reflexes: Deep tendon reflexes: Biceps: 2/2, Brachioradialis: 2/2, Triceps: 2/2, Pateller: 1/1, Achilles:absent.  Plantar responses are flexor.   Assessment and Plan:  72 year old [place] American male with vascular risk factor of hypertension, diabetes, hyperlipidemia, insideous  onset of short-term memory trouble,MMSE 22/30 , most recent hospital admission for GIST tumor resection, malnutrition,   1. 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 2. continue moderate exercise, increase by mouth intake . 3. Return to clinic with [name] in one year

## 2012-07-12 ENCOUNTER — Ambulatory Visit (INDEPENDENT_AMBULATORY_CARE_PROVIDER_SITE_OTHER): Payer: Medicare Other | Admitting: General Surgery

## 2012-07-12 ENCOUNTER — Encounter (INDEPENDENT_AMBULATORY_CARE_PROVIDER_SITE_OTHER): Payer: Self-pay | Admitting: General Surgery

## 2012-07-12 VITALS — BP 136/94 | HR 76 | Temp 97.2°F | Resp 24 | Ht 65.75 in | Wt 159.0 lb

## 2012-07-12 DIAGNOSIS — IMO0002 Reserved for concepts with insufficient information to code with codable children: Secondary | ICD-10-CM

## 2012-07-12 DIAGNOSIS — D499 Neoplasm of unspecified behavior of unspecified site: Secondary | ICD-10-CM

## 2012-07-12 NOTE — Progress Notes (Signed)
The patient comes in today doing very well. He no longer has any significant hoarseness. Is eating well and gaining weight.  On examination today his abdominal wall incision is healed well. There is no evidence of infection. There is no evidence of hernia.  The medical oncologist was awaiting release by me in order to consider treatment. The patient is well enough now to undergo treatment if necessary. Further evaluation with a PET scan and or repeat CT scan may be necessary. The patient can return to see me at any point if he needs to.

## 2012-07-13 ENCOUNTER — Telehealth: Payer: Self-pay | Admitting: *Deleted

## 2012-07-13 NOTE — Telephone Encounter (Signed)
Pt called asking for appt "since Dr. Lindie [name] has released me"  Per Dr. Truett Perna, notified pt that Dr. Truett Perna can see him Friday 07/15/12 at 10am.  Pt verbalized understanding and stated "I'll be there"

## 2012-07-15 ENCOUNTER — Telehealth (INDEPENDENT_AMBULATORY_CARE_PROVIDER_SITE_OTHER): Payer: Self-pay

## 2012-07-15 ENCOUNTER — Ambulatory Visit (HOSPITAL_BASED_OUTPATIENT_CLINIC_OR_DEPARTMENT_OTHER): Payer: Medicare Other | Admitting: Oncology

## 2012-07-15 ENCOUNTER — Encounter (INDEPENDENT_AMBULATORY_CARE_PROVIDER_SITE_OTHER): Payer: Medicare Other

## 2012-07-15 ENCOUNTER — Telehealth: Payer: Self-pay | Admitting: Oncology

## 2012-07-15 VITALS — BP 197/78 | HR 74 | Temp 97.9°F | Resp 18 | Ht 65.0 in | Wt 164.5 lb

## 2012-07-15 DIAGNOSIS — E119 Type 2 diabetes mellitus without complications: Secondary | ICD-10-CM

## 2012-07-15 DIAGNOSIS — I2510 Atherosclerotic heart disease of native coronary artery without angina pectoris: Secondary | ICD-10-CM

## 2012-07-15 DIAGNOSIS — N189 Chronic kidney disease, unspecified: Secondary | ICD-10-CM

## 2012-07-15 DIAGNOSIS — C166 Malignant neoplasm of greater curvature of stomach, unspecified: Secondary | ICD-10-CM

## 2012-07-15 DIAGNOSIS — IMO0002 Reserved for concepts with insufficient information to code with codable children: Secondary | ICD-10-CM

## 2012-07-15 NOTE — Telephone Encounter (Signed)
gv and printed appt sched and avs for pt  °

## 2012-07-15 NOTE — Telephone Encounter (Signed)
Pt came to office today c/o one retained staple at umbilical area. He was at his PCP today and advised of staple. He asked pt to stop by our office to have removed. Wound is clean and healed. One retained staple found. Staple removed and pt advised if any concerns to call.

## 2012-07-15 NOTE — Progress Notes (Signed)
   [facility]    OFFICE PROGRESS NOTE   INTERVAL HISTORY:   He was last seen at the cancer Center on 05/17/2012. He appeared weak and lethargic. He was unable to receive Gleevec therapy at the time. We referred him to his primary physician for further evaluation. He was evaluated by Dr. Lindie Spruce on 05/19/2012 and again on 05/31/2012. When he was seen by Dr. Lindie Spruce on 07/12/2012 he was noted to be eating and gaining weight. The abdominal incision had healed and Dr. Lindie Spruce feels he is now well enough to undergo treatment for the gastrointestinal stromal tumor.  Mr. Witherspoon feels well. He has also been evaluated by neurology for memory loss. He has difficulty naming his medications. He reports no longer taking insulin.  Objective:  Vital signs in last 24 hours:  Blood pressure 197/78, pulse 74, temperature 97.9 F (36.6 C), temperature source Oral, resp. rate 18, height 5\' 5"  (1.651 m), weight 164 lb 8 oz (74.617 kg), SpO2 99.00%.    HEENT: Neck without mass Lymphatics: No cervical, supra-clavicular, axillary, or inguinal nodes Resp: Lungs clear bilaterally Cardio: Regular rate and rhythm GI: The abdomen is soft and nontender, no hepatomegaly, no mass, healed midline incision with a single staple remaining Vascular: No leg edema Neuro: Alert and oriented, follows commands      Lab Results:  Lab Results  Component Value Date   WBC 10.9* 05/17/2012   HGB 10.0* 05/17/2012   HCT 31.1* 05/17/2012   MCV 86.6 05/17/2012   PLT 331 05/17/2012    Medications: I have reviewed the patient's current medications.  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 Diabetes  3. History of coronary artery disease  4. Chronic renal failure  5. Admission with gastrointestinal bleeding and severe anemia 04/05/2012  6. Hypotension/failure to  thrive-when he was here on 05/17/2012. His performance status now appears much improved. 7. 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 Pinsky has a markedly improved performance status today. He has a complex medical regimen and continues to have difficulty naming his medications. He reports he uses a pill box.  He remains at high risk of developing recurrent gastrointestinal stromal tumor. The plan is to begin adjuvant Gleevec therapy. I reviewed the potential toxicities associated with Gleevec including the chance for a skin rash, diarrhea, edema, mucositis, and hematologic toxicity. We also discussed the potential for cardiac toxicity with Gleevec. He will attend a chemotherapy teaching class. I encouraged him to bring his daughter to the chemotherapy teaching class. He will return for an office visit approximately 2 weeks after beginning Gleevec. The plan is to administer a 3 year course of adjuvant Gleevec.   Thornton Papas, MD  07/15/2012  5:41 PM

## 2012-07-19 ENCOUNTER — Other Ambulatory Visit (HOSPITAL_BASED_OUTPATIENT_CLINIC_OR_DEPARTMENT_OTHER): Payer: Medicare Other | Admitting: Lab

## 2012-07-19 ENCOUNTER — Other Ambulatory Visit: Payer: Self-pay | Admitting: *Deleted

## 2012-07-19 ENCOUNTER — Encounter: Payer: Self-pay | Admitting: *Deleted

## 2012-07-19 ENCOUNTER — Other Ambulatory Visit: Payer: Medicare Other

## 2012-07-19 ENCOUNTER — Encounter: Payer: Self-pay | Admitting: Oncology

## 2012-07-19 DIAGNOSIS — IMO0002 Reserved for concepts with insufficient information to code with codable children: Secondary | ICD-10-CM

## 2012-07-19 DIAGNOSIS — C166 Malignant neoplasm of greater curvature of stomach, unspecified: Secondary | ICD-10-CM

## 2012-07-19 LAB — CBC WITH DIFFERENTIAL/PLATELET
Basophils Absolute: 0 10*3/uL (ref 0.0–0.1)
Eosinophils Absolute: 0.2 10*3/uL (ref 0.0–0.5)
HCT: 34.9 % — ABNORMAL LOW (ref 38.4–49.9)
HGB: 11.6 g/dL — ABNORMAL LOW (ref 13.0–17.1)
MONO#: 0.4 10*3/uL (ref 0.1–0.9)
NEUT#: 4.5 10*3/uL (ref 1.5–6.5)
NEUT%: 65.5 % (ref 39.0–75.0)
RDW: 16.4 % — ABNORMAL HIGH (ref 11.0–14.6)
lymph#: 1.8 10*3/uL (ref 0.9–3.3)

## 2012-07-19 LAB — COMPREHENSIVE METABOLIC PANEL (CC13)
ALT: 10 U/L (ref 0–55)
CO2: 25 mEq/L (ref 22–29)
Creatinine: 1.2 mg/dL (ref 0.7–1.3)
Total Bilirubin: 0.28 mg/dL (ref 0.20–1.20)

## 2012-07-19 MED ORDER — IMATINIB MESYLATE 400 MG PO TABS: 400.0000 mg | ORAL_TABLET | Freq: Every day | ORAL | Status: DC

## 2012-07-19 NOTE — Progress Notes (Signed)
Faxed gleevec 400mg  pa form to [facility] @ [phone]

## 2012-07-19 NOTE — Progress Notes (Signed)
RECEIVED A FAX FROM  OUTPATIENT PHARMACY CONCERNING A PRIOR AUTHORIZATION FOR GLEEVEC. THIS REQUEST WAS PLACED IN THE MANAGED CARE BIN.

## 2012-07-20 ENCOUNTER — Encounter: Payer: Self-pay | Admitting: Oncology

## 2012-07-20 NOTE — Progress Notes (Signed)
[place], [id number], approved gleevec 400mg  from 07/19/12-07/20/14.

## 2012-07-25 ENCOUNTER — Telehealth: Payer: Self-pay | Admitting: *Deleted

## 2012-07-25 DIAGNOSIS — E876 Hypokalemia: Secondary | ICD-10-CM

## 2012-07-25 NOTE — Telephone Encounter (Signed)
Message copied by Wandalee Ferdinand on Mon Jul 25, 2012 12:18 PM ------      Message from: Ladene Artist      Created: Tue Jul 19, 2012  9:02 PM       Please call patient, be sure he is taking kcl, repeat cmet next lab      Copy cmet to primary MD ------

## 2012-07-25 NOTE — Telephone Encounter (Signed)
Left VM for patient to call to discuss cmet results. Need to confirm he is taking his KCl 40 meq bid. Copy of lab sent to Dr. Julio Sicks and will recheck at next lab visit.

## 2012-07-25 NOTE — Telephone Encounter (Signed)
Pt returned call, he reports he is not taking Potassium. His son in law fills his pill box for him, he doesn't see potassium bottle anywhere. No potassium tablets in pill box. Pt stated he does not have packet either. Requests a new Rx to be sent to [facility]. Pt states he does not have trouble swallowing pills.

## 2012-07-26 ENCOUNTER — Telehealth: Payer: Self-pay | Admitting: *Deleted

## 2012-07-26 MED ORDER — POTASSIUM CHLORIDE 20 MEQ PO PACK: 20.0000 meq | PACK | Freq: Two times a day (BID) | ORAL | Status: DC

## 2012-07-26 NOTE — Telephone Encounter (Signed)
Message copied by Wandalee Ferdinand on Tue Jul 26, 2012 12:24 PM ------      Message from: Ladene Artist      Created: Tue Jul 19, 2012  9:02 PM       Please call patient, be sure he is taking kcl, repeat cmet next lab      Copy cmet to primary MD ------

## 2012-07-26 NOTE — Telephone Encounter (Addendum)
Talked with patient about his lab results. He has not been taking his K+ (has been out and not sure for how long). Went through all his medication bottles and does not have it. May have been stopped last time he left the hospital. He wants it sent to his mail order pharmacy and ask to over night ship. Declines local pharmacy for few days supply. He reports he received his Gleevec in the mail today and will start it today.

## 2012-07-26 NOTE — Telephone Encounter (Signed)
Per Dr. Truett Perna: Since he has not been taking the KCL, let's start with 20 meq bid for now.

## 2012-07-27 NOTE — Telephone Encounter (Signed)
Late entry for 6/9 1630: Note sent to pt's PCP to address potassium level/ Rx.

## 2012-08-05 ENCOUNTER — Telehealth: Payer: Self-pay | Admitting: *Deleted

## 2012-08-05 NOTE — Telephone Encounter (Signed)
Pt walk-in stating that he didn't take his meds with him on a trip & got sick while out of town. Discussed with pt & reports that he went to the beach & his blood sugar dropped & he was in the hospital for 2 days & hasn't taken his gleevec for 4 days.  Discussed with Dr Truett Perna & encouraged pt to restart his gleevec b/c Dr does not believe it has anything to do with his blood sugar.  Pt has seen PCP since home per pt.  Reminded of 08/08/12 appt @ 9:30 am.

## 2012-08-08 ENCOUNTER — Telehealth: Payer: Self-pay | Admitting: *Deleted

## 2012-08-08 ENCOUNTER — Other Ambulatory Visit: Payer: Medicare Other | Admitting: Lab

## 2012-08-08 ENCOUNTER — Ambulatory Visit: Payer: Medicare Other | Admitting: Oncology

## 2012-08-08 NOTE — Telephone Encounter (Signed)
Called patient with appointment with Dr. Truett Perna for tomorrow at 0830. [name] agrees [name] will be there.

## 2012-08-08 NOTE — Telephone Encounter (Signed)
Patient called in inquire when is his appointment? Asking if it is on Tuesday?  Made him aware that is appointment was 30 minutes ago at 0930 as he was told on Friday, June 20th. He apologizes and says "all the medicine I'm on makes me forget things". Told him we will call back to reschedule.

## 2012-08-09 ENCOUNTER — Telehealth: Payer: Self-pay | Admitting: Oncology

## 2012-08-09 ENCOUNTER — Ambulatory Visit (HOSPITAL_BASED_OUTPATIENT_CLINIC_OR_DEPARTMENT_OTHER): Payer: Medicare Other | Admitting: Oncology

## 2012-08-09 ENCOUNTER — Ambulatory Visit (HOSPITAL_BASED_OUTPATIENT_CLINIC_OR_DEPARTMENT_OTHER): Payer: Medicare Other | Admitting: Lab

## 2012-08-09 VITALS — BP 155/59 | HR 60 | Temp 97.0°F | Resp 20 | Ht 65.0 in | Wt 172.2 lb

## 2012-08-09 DIAGNOSIS — E876 Hypokalemia: Secondary | ICD-10-CM

## 2012-08-09 DIAGNOSIS — D499 Neoplasm of unspecified behavior of unspecified site: Secondary | ICD-10-CM

## 2012-08-09 DIAGNOSIS — IMO0002 Reserved for concepts with insufficient information to code with codable children: Secondary | ICD-10-CM

## 2012-08-09 LAB — COMPREHENSIVE METABOLIC PANEL (CC13)
ALT: 12 U/L (ref 0–55)
AST: 15 U/L (ref 5–34)
Chloride: 110 mEq/L — ABNORMAL HIGH (ref 98–107)
Creatinine: 1.2 mg/dL (ref 0.7–1.3)
Sodium: 144 mEq/L (ref 136–145)
Total Bilirubin: 0.33 mg/dL (ref 0.20–1.20)
Total Protein: 6.1 g/dL — ABNORMAL LOW (ref 6.4–8.3)

## 2012-08-09 LAB — CBC WITH DIFFERENTIAL/PLATELET
Basophils Absolute: 0.1 10*3/uL (ref 0.0–0.1)
Eosinophils Absolute: 0.3 10*3/uL (ref 0.0–0.5)
HCT: 32.8 % — ABNORMAL LOW (ref 38.4–49.9)
HGB: 10.8 g/dL — ABNORMAL LOW (ref 13.0–17.1)
LYMPH%: 32.4 % (ref 14.0–49.0)
MCHC: 33.1 g/dL (ref 32.0–36.0)
MONO#: 0.4 10*3/uL (ref 0.1–0.9)
NEUT#: 4.8 10*3/uL (ref 1.5–6.5)
NEUT%: 58.3 % (ref 39.0–75.0)
Platelets: 184 10*3/uL (ref 140–400)
WBC: 8.2 10*3/uL (ref 4.0–10.3)

## 2012-08-09 NOTE — Telephone Encounter (Signed)
Gave pt appt for lab and ML for July, 2014 pt sent to labs today

## 2012-08-09 NOTE — Progress Notes (Signed)
   [facility]    OFFICE PROGRESS NOTE   INTERVAL HISTORY:   He started Gleevec on 07/26/2012. He reports tolerating the Gleevec well. No rash, diarrhea, or edema. Mr. Boschee was admitted to the hospital in [place] last week with symptomatic hypoglycemia. He reports remaining in the hospital for 2 days. Since discharge from the hospital his blood sugar has been under good control. He saw Dr. Kateri Plummer for adjustment of the diabetic regimen.  Objective:  Vital signs in last 24 hours:  Blood pressure 155/59, pulse 60, temperature 97 F (36.1 C), temperature source Oral, resp. rate 20, height 5\' 5"  (1.651 m), weight 172 lb 3.2 oz (78.109 kg).    HEENT: No thrush or ulcers Resp: Lungs clear bilaterally Cardio: Regular rate and rhythm GI: Nontender, no hepatomegaly Vascular: No leg edema  Skin: No rash     Lab Results:  Lab Results  Component Value Date   WBC 8.2 08/09/2012   HGB 10.8* 08/09/2012   HCT 32.8* 08/09/2012   MCV 83.9 08/09/2012   PLT 184 08/09/2012   ANC 4.8 Glucose 110, potassium 3.6, BUN 12.8, creatinine 1.2  Medications: I have reviewed the patient's current medications.  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 Diabetes  3. History of coronary artery disease  4. Chronic renal failure  5. Admission with gastrointestinal bleeding and severe anemia 04/05/2012  6. Hypotension/failure to thrive-when he was here on 05/17/2012. His performance status now appears much improved.  7. 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 admission with symptomatic hypoglycemia in [facility] week of 08/01/2012     Disposition:  Mr. Ginsberg appears to be  tolerating the Gleevec well to date. I suspect the admission with hypoglycemia last week was unrelated to [facility] therapy. He will continue Gleevec and return for an office/lab visit in 2 weeks.   Thornton Papas, MD  08/09/2012  5:26 PM

## 2012-08-25 ENCOUNTER — Other Ambulatory Visit (HOSPITAL_BASED_OUTPATIENT_CLINIC_OR_DEPARTMENT_OTHER): Payer: Medicare Other | Admitting: Lab

## 2012-08-25 ENCOUNTER — Ambulatory Visit (HOSPITAL_BASED_OUTPATIENT_CLINIC_OR_DEPARTMENT_OTHER): Payer: Medicare Other | Admitting: Nurse Practitioner

## 2012-08-25 VITALS — BP 182/56 | HR 40 | Temp 98.4°F | Resp 18 | Ht 65.0 in | Wt 180.4 lb

## 2012-08-25 DIAGNOSIS — IMO0002 Reserved for concepts with insufficient information to code with codable children: Secondary | ICD-10-CM

## 2012-08-25 DIAGNOSIS — D499 Neoplasm of unspecified behavior of unspecified site: Secondary | ICD-10-CM

## 2012-08-25 LAB — COMPREHENSIVE METABOLIC PANEL (CC13)
Albumin: 2.8 g/dL — ABNORMAL LOW (ref 3.5–5.0)
Alkaline Phosphatase: 79 U/L (ref 40–150)
Calcium: 8.4 mg/dL (ref 8.4–10.4)
Creatinine: 1.4 mg/dL — ABNORMAL HIGH (ref 0.7–1.3)
Total Bilirubin: 0.24 mg/dL (ref 0.20–1.20)
Total Protein: 5.9 g/dL — ABNORMAL LOW (ref 6.4–8.3)

## 2012-08-25 LAB — CBC WITH DIFFERENTIAL/PLATELET
BASO%: 0.6 % (ref 0.0–2.0)
EOS%: 3 % (ref 0.0–7.0)
HCT: 32.1 % — ABNORMAL LOW (ref 38.4–49.9)
LYMPH%: 29.2 % (ref 14.0–49.0)
MCH: 28.5 pg (ref 27.2–33.4)
MCHC: 33.2 g/dL (ref 32.0–36.0)
MONO%: 6 % (ref 0.0–14.0)
NEUT%: 61.2 % (ref 39.0–75.0)
Platelets: 192 10*3/uL (ref 140–400)

## 2012-08-25 NOTE — Progress Notes (Signed)
OFFICE PROGRESS NOTE  Interval history:  Mr. Poythress returns as scheduled. He continues Gleevec. He denies nausea/vomiting. No diarrhea. No skin rash. He has noted a trace amount of swelling at the wrists and ankles. No shortness of breath or cough.   Objective: Blood pressure 182/56, pulse 40, temperature 98.4 F (36.9 C), temperature source Oral, resp. rate 18, height 5\' 5"  (1.651 m), weight 180 lb 6.4 oz (81.829 kg).  Oropharynx is without thrush or ulceration. Lungs are clear. Regular cardiac rhythm. Abdomen soft and nontender. No hepatomegaly. Trace lower leg/ankle edema bilaterally. No skin rash.  Lab Results: Lab Results  Component Value Date   WBC 8.4 08/25/2012   HGB 10.7* 08/25/2012   HCT 32.1* 08/25/2012   MCV 85.9 08/25/2012   PLT 192 08/25/2012    Chemistry:    Chemistry      Component Value Date/Time   NA 142 08/25/2012 0959   NA 138 05/13/2012 1857   K 4.2 08/25/2012 0959   K 4.1 05/13/2012 1857   CL 110* 08/09/2012 0922   CL 104 05/13/2012 1857   CO2 25 08/25/2012 0959   CO2 20 05/13/2012 1857   BUN 18.0 08/25/2012 0959   BUN 18 05/13/2012 1857   CREATININE 1.4* 08/25/2012 0959   CREATININE 1.32 05/13/2012 1857      Component Value Date/Time   CALCIUM 8.4 08/25/2012 0959   CALCIUM 9.0 05/13/2012 1857   ALKPHOS 79 08/25/2012 0959   ALKPHOS 94 05/13/2012 1857   AST 23 08/25/2012 0959   AST 19 05/13/2012 1857   ALT 28 08/25/2012 0959   ALT 7 05/13/2012 1857   BILITOT 0.24 08/25/2012 0959   BILITOT 0.3 05/13/2012 1857       Studies/Results: No results found.  Medications: I have reviewed the patient's current medications.  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 Diabetes  3. History of coronary artery disease  4. Chronic renal failure  5. Admission with  gastrointestinal bleeding and severe anemia 04/05/2012  6. Hypotension/failure to thrive-when he was here on 05/17/2012. His performance status continues to be improved.  7. 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  8. Admission with symptomatic hypoglycemia in [facility] week of 08/01/2012.  Disposition-[name] will continue Gleevec. He appears to be tolerating it well. We will see him back in 2 weeks.   Plan reviewed with Dr. Truett Perna.    Lonna Cobb ANP/GNP-BC

## 2012-08-26 ENCOUNTER — Telehealth: Payer: Self-pay | Admitting: Oncology

## 2012-08-26 NOTE — Telephone Encounter (Signed)
S/w the pt and he is aware of his July appts. °

## 2012-08-29 ENCOUNTER — Other Ambulatory Visit: Payer: Self-pay | Admitting: Oncology

## 2012-09-01 ENCOUNTER — Other Ambulatory Visit: Payer: Self-pay | Admitting: Internal Medicine

## 2012-09-02 NOTE — Telephone Encounter (Signed)
Rx was sent to pharmacy electronically. 

## 2012-09-08 ENCOUNTER — Telehealth: Payer: Self-pay | Admitting: Oncology

## 2012-09-08 ENCOUNTER — Ambulatory Visit (HOSPITAL_BASED_OUTPATIENT_CLINIC_OR_DEPARTMENT_OTHER): Payer: Medicare Other | Admitting: Nurse Practitioner

## 2012-09-08 ENCOUNTER — Other Ambulatory Visit (HOSPITAL_BASED_OUTPATIENT_CLINIC_OR_DEPARTMENT_OTHER): Payer: Medicare Other | Admitting: Lab

## 2012-09-08 VITALS — BP 142/58 | HR 67 | Temp 97.9°F | Resp 18 | Ht 65.0 in | Wt 179.7 lb

## 2012-09-08 DIAGNOSIS — C494 Malignant neoplasm of connective and soft tissue of abdomen: Secondary | ICD-10-CM

## 2012-09-08 DIAGNOSIS — C49A0 Gastrointestinal stromal tumor, unspecified site: Secondary | ICD-10-CM

## 2012-09-08 DIAGNOSIS — D499 Neoplasm of unspecified behavior of unspecified site: Secondary | ICD-10-CM

## 2012-09-08 DIAGNOSIS — IMO0002 Reserved for concepts with insufficient information to code with codable children: Secondary | ICD-10-CM

## 2012-09-08 LAB — CBC WITH DIFFERENTIAL/PLATELET
Basophils Absolute: 0 10*3/uL (ref 0.0–0.1)
EOS%: 2.2 % (ref 0.0–7.0)
Eosinophils Absolute: 0.2 10*3/uL (ref 0.0–0.5)
HGB: 10.3 g/dL — ABNORMAL LOW (ref 13.0–17.1)
MCV: 89 fL (ref 79.3–98.0)
MONO%: 6.3 % (ref 0.0–14.0)
NEUT#: 4.6 10*3/uL (ref 1.5–6.5)
RBC: 3.65 10*6/uL — ABNORMAL LOW (ref 4.20–5.82)
RDW: 19.5 % — ABNORMAL HIGH (ref 11.0–14.6)
lymph#: 2.3 10*3/uL (ref 0.9–3.3)

## 2012-09-08 LAB — COMPREHENSIVE METABOLIC PANEL (CC13)
AST: 71 U/L — ABNORMAL HIGH (ref 5–34)
Albumin: 3 g/dL — ABNORMAL LOW (ref 3.5–5.0)
Alkaline Phosphatase: 111 U/L (ref 40–150)
BUN: 21.3 mg/dL (ref 7.0–26.0)
Calcium: 8.6 mg/dL (ref 8.4–10.4)
Chloride: 112 mEq/L — ABNORMAL HIGH (ref 98–109)
Glucose: 214 mg/dl — ABNORMAL HIGH (ref 70–140)
Potassium: 4.3 mEq/L (ref 3.5–5.1)
Sodium: 144 mEq/L (ref 136–145)
Total Protein: 6.3 g/dL — ABNORMAL LOW (ref 6.4–8.3)

## 2012-09-08 NOTE — Telephone Encounter (Signed)
gv and printed appt sched and avs for pt  °

## 2012-09-08 NOTE — Progress Notes (Signed)
OFFICE PROGRESS NOTE  Interval history:  Mr. Ortega returns as scheduled. He continues Gleevec. He denies nausea. No diarrhea. No skin rash. He denies any leg swelling. No abnormal pain.   Objective: Blood pressure 142/58, pulse 67, temperature 97.9 F (36.6 C), temperature source Oral, resp. rate 18, height 5\' 5"  (1.651 m), weight 179 lb 11.2 oz (81.511 kg).  No thrush. Lungs clear. Regular cardiac rhythm. Abdomen soft. No hepatomegaly. Trace lower leg/ankle edema bilaterally. No skin rash.  Lab Results: Lab Results  Component Value Date   WBC 7.6 09/08/2012   HGB 10.3* 09/08/2012   HCT 32.5* 09/08/2012   MCV 89.0 09/08/2012   PLT 198 09/08/2012    Chemistry:    Chemistry      Component Value Date/Time   NA 144 09/08/2012 1138   NA 138 05/13/2012 1857   K 4.3 09/08/2012 1138   K 4.1 05/13/2012 1857   CL 110* 08/09/2012 0922   CL 104 05/13/2012 1857   CO2 26 09/08/2012 1138   CO2 20 05/13/2012 1857   BUN 21.3 09/08/2012 1138   BUN 18 05/13/2012 1857   CREATININE 1.5* 09/08/2012 1138   CREATININE 1.32 05/13/2012 1857      Component Value Date/Time   CALCIUM 8.6 09/08/2012 1138   CALCIUM 9.0 05/13/2012 1857   ALKPHOS 111 09/08/2012 1138   ALKPHOS 94 05/13/2012 1857   AST 71* 09/08/2012 1138   AST 19 05/13/2012 1857   ALT 68* 09/08/2012 1138   ALT 7 05/13/2012 1857   BILITOT 0.26 09/08/2012 1138   BILITOT 0.3 05/13/2012 1857       Studies/Results: No results found.  Medications: I have reviewed the patient's current medications.  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  -Initiation of adjuvant Gleevec on 07/26/2012.  2. Diabetes.  3. History of coronary artery disease.  4. Chronic renal failure.  5. Admission with gastrointestinal bleeding and severe anemia 04/05/2012.  6. Hypotension/failure to thrive-when he was here on  05/17/2012. His performance status continues to be improved.  7. 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  8. Admission with symptomatic hypoglycemia in [facility] week of 08/01/2012. 9. Elevation of transaminases. Question related to Gleevec. We will repeat when he returns in 2 weeks.  Disposition-Mr. Sato continues Gleevec. He appears to be tolerating it well. He will return for a followup visit and repeat chemistry panel in 2 weeks. He will contact the office in the interim with any problems.  We reviewed his medications in the office today. He will contact his primary physician for clarification on the blood pressure and diabetes medications.   Lonna Cobb ANP/GNP-BC

## 2012-09-09 ENCOUNTER — Telehealth: Payer: Self-pay | Admitting: *Deleted

## 2012-09-09 NOTE — Telephone Encounter (Signed)
Pt called stating he has "swelling in my ankles; what do I do about it?"  Pt states swelling is not in calves or feet just ankles; not pitting.  Pt denies any other complaints.  Pt was seen in office yesterday with Lonna Cobb, NP.  Per Dr. Truett Perna, instructed pt to watch for now and if more swelling or SOB to call office.  Pt verbalized understanding.

## 2012-09-23 ENCOUNTER — Telehealth: Payer: Self-pay | Admitting: Oncology

## 2012-09-23 ENCOUNTER — Other Ambulatory Visit (HOSPITAL_BASED_OUTPATIENT_CLINIC_OR_DEPARTMENT_OTHER): Payer: Medicare Other | Admitting: Lab

## 2012-09-23 ENCOUNTER — Ambulatory Visit (HOSPITAL_BASED_OUTPATIENT_CLINIC_OR_DEPARTMENT_OTHER): Payer: Medicare Other | Admitting: Nurse Practitioner

## 2012-09-23 VITALS — BP 182/60 | HR 79 | Temp 98.6°F | Resp 20 | Ht 65.0 in | Wt 180.2 lb

## 2012-09-23 DIAGNOSIS — C494 Malignant neoplasm of connective and soft tissue of abdomen: Secondary | ICD-10-CM

## 2012-09-23 DIAGNOSIS — C49A0 Gastrointestinal stromal tumor, unspecified site: Secondary | ICD-10-CM

## 2012-09-23 LAB — CBC WITH DIFFERENTIAL/PLATELET
EOS%: 3.9 % (ref 0.0–7.0)
Eosinophils Absolute: 0.3 10*3/uL (ref 0.0–0.5)
MCV: 89 fL (ref 79.3–98.0)
MONO%: 6.8 % (ref 0.0–14.0)
NEUT#: 3.6 10*3/uL (ref 1.5–6.5)
RBC: 3.63 10*6/uL — ABNORMAL LOW (ref 4.20–5.82)
RDW: 20.3 % — ABNORMAL HIGH (ref 11.0–14.6)

## 2012-09-23 LAB — COMPREHENSIVE METABOLIC PANEL (CC13)
AST: 18 U/L (ref 5–34)
Albumin: 3.1 g/dL — ABNORMAL LOW (ref 3.5–5.0)
Alkaline Phosphatase: 91 U/L (ref 40–150)
Glucose: 89 mg/dl (ref 70–140)
Potassium: 3.6 mEq/L (ref 3.5–5.1)
Sodium: 145 mEq/L (ref 136–145)
Total Protein: 6.3 g/dL — ABNORMAL LOW (ref 6.4–8.3)

## 2012-09-23 NOTE — Telephone Encounter (Signed)
gv and printed appt sched and avs for pt  °

## 2012-09-23 NOTE — Progress Notes (Signed)
OFFICE PROGRESS NOTE  Interval history:  [name] returns as scheduled. He continues Gleevec. He denies nausea/vomiting. No diarrhea. No skin rash. No shortness of breath. Leg swelling is better. He denies abdominal pain. Blood pressure noted to be elevated in the office. He has not taken his medications yet this morning.   Objective: Blood pressure 182/60, pulse 79, temperature 98.6 F (37 C), temperature source Oral, resp. rate 20, height 5\' 5"  (1.651 m), weight 180 lb 3.2 oz (81.738 kg).  Oropharynx is without thrush or ulceration. Lungs are clear. No wheezes or rales. Regular cardiac rhythm with occasional premature beats. Abdomen soft. No hepatomegaly. No mass. Trace lower leg/ankle edema bilaterally. No skin rash.  Lab Results: Lab Results  Component Value Date   WBC 6.9 09/23/2012   HGB 10.5* 09/23/2012   HCT 32.3* 09/23/2012   MCV 89.0 09/23/2012   PLT 216 09/23/2012    Chemistry:    Chemistry      Component Value Date/Time   NA 145 09/23/2012 0919   NA 138 05/13/2012 1857   K 3.6 09/23/2012 0919   K 4.1 05/13/2012 1857   CL 110* 08/09/2012 0922   CL 104 05/13/2012 1857   CO2 26 09/23/2012 0919   CO2 20 05/13/2012 1857   BUN 16.9 09/23/2012 0919   BUN 18 05/13/2012 1857   CREATININE 1.5* 09/23/2012 0919   CREATININE 1.32 05/13/2012 1857      Component Value Date/Time   CALCIUM 8.5 09/23/2012 0919   CALCIUM 9.0 05/13/2012 1857   ALKPHOS 91 09/23/2012 0919   ALKPHOS 94 05/13/2012 1857   AST 18 09/23/2012 0919   AST 19 05/13/2012 1857   ALT 16 09/23/2012 0919   ALT 7 05/13/2012 1857   BILITOT 0.29 09/23/2012 0919   BILITOT 0.3 05/13/2012 1857       Studies/Results: No results found.  Medications: I have reviewed the patient's current medications.  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  -Initiation of adjuvant Gleevec on 07/26/2012.   2. Diabetes.  3. History of coronary artery disease.  4. Chronic renal failure.  5. Admission with gastrointestinal bleeding and severe anemia 04/05/2012.  6. Hypotension/failure to thrive-when he was here on 05/17/2012. His performance status continues to be improved.  7. 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  8. Admission with symptomatic hypoglycemia in [facility] week of 08/01/2012.  9. Elevation of transaminases 09/08/2012. Question related to Gleevec. Results currently pending.  Disposition-[name] appears to be tolerating the Gleevec well. We will followup on the chemistry panel from today. He will return for a followup visit in one month. He will contact the office in the interim with any problems.  Plan reviewed with Dr. Truett Perna.   Lonna Cobb ANP/GNP-BC

## 2012-10-25 ENCOUNTER — Telehealth: Payer: Self-pay | Admitting: Oncology

## 2012-10-25 ENCOUNTER — Ambulatory Visit (HOSPITAL_BASED_OUTPATIENT_CLINIC_OR_DEPARTMENT_OTHER): Payer: Medicare Other | Admitting: Oncology

## 2012-10-25 ENCOUNTER — Other Ambulatory Visit (HOSPITAL_BASED_OUTPATIENT_CLINIC_OR_DEPARTMENT_OTHER): Payer: Medicare Other | Admitting: Lab

## 2012-10-25 VITALS — BP 140/70 | HR 85 | Temp 97.1°F | Resp 19 | Ht 65.0 in | Wt 184.3 lb

## 2012-10-25 DIAGNOSIS — C166 Malignant neoplasm of greater curvature of stomach, unspecified: Secondary | ICD-10-CM

## 2012-10-25 DIAGNOSIS — IMO0002 Reserved for concepts with insufficient information to code with codable children: Secondary | ICD-10-CM

## 2012-10-25 DIAGNOSIS — N189 Chronic kidney disease, unspecified: Secondary | ICD-10-CM

## 2012-10-25 DIAGNOSIS — C49A0 Gastrointestinal stromal tumor, unspecified site: Secondary | ICD-10-CM

## 2012-10-25 LAB — CBC WITH DIFFERENTIAL/PLATELET
EOS%: 3.4 % (ref 0.0–7.0)
MCH: 31.1 pg (ref 27.2–33.4)
MCV: 93.6 fL (ref 79.3–98.0)
MONO%: 5.8 % (ref 0.0–14.0)
RBC: 3.59 10*6/uL — ABNORMAL LOW (ref 4.20–5.82)
RDW: 23.3 % — ABNORMAL HIGH (ref 11.0–14.6)

## 2012-10-25 LAB — COMPREHENSIVE METABOLIC PANEL (CC13)
AST: 18 U/L (ref 5–34)
Albumin: 3.3 g/dL — ABNORMAL LOW (ref 3.5–5.0)
Alkaline Phosphatase: 80 U/L (ref 40–150)
BUN: 23.5 mg/dL (ref 7.0–26.0)
Potassium: 4.1 mEq/L (ref 3.5–5.1)
Sodium: 145 mEq/L (ref 136–145)
Total Protein: 6.5 g/dL (ref 6.4–8.3)

## 2012-10-25 NOTE — Progress Notes (Signed)
   [facility]    OFFICE PROGRESS NOTE   INTERVAL HISTORY:   He returns as scheduled. He continues Gleevec. He reports tolerating the Gleevec well. No skin rash, mouth sores, or diarrhea. He reports the blood sugars are running as high as the 200s and as low as 100. He has noted mild lower leg and periorbital edema.  Objective:  Vital signs in last 24 hours:  Blood pressure 140/70, pulse 85, temperature 97.1 F (36.2 C), temperature source Oral, resp. rate 19, height 5\' 5"  (1.651 m), weight 184 lb 4.8 oz (83.598 kg).    HEENT: No thrush or ulcers, mild periorbital edema Resp: Lungs clear bilaterally Cardio: Regular rate and rhythm GI: No hepatosplenomegaly, nontender, no mass Vascular: Trace pitting edema at the low leg bilaterally Neuro: Alert and oriented  Skin: No rash   Lab Results:  Lab Results  Component Value Date   WBC 7.4 10/25/2012   HGB 11.2* 10/25/2012   HCT 33.6* 10/25/2012   MCV 93.6 10/25/2012   PLT 192 10/25/2012   ANC 4.9    Medications: I have reviewed the patient's current medications.  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  -Initiation of adjuvant Gleevec on 07/26/2012.  2. Diabetes.  3. History of coronary artery disease.  4. Chronic renal failure.  5. Admission with gastrointestinal bleeding and severe anemia 04/05/2012.  6. Hypotension/failure to thrive-when he was here on 05/17/2012. His performance status continues to be improved.  7. 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  8. Admission with symptomatic hypoglycemia in [facility] week of 08/01/2012.    Disposition:  He appears to be tolerating the Gleevec well. He has mild periorbital and lower extremity edema, potentially  related to Gleevec. I did not increase the Lasix dose. Mr. Guedes will return for an office visit in one month.   Thornton Papas, MD  10/25/2012  6:31 PM

## 2012-10-25 NOTE — Telephone Encounter (Signed)
gv and printed appt sched and avs for pt for OCT. °

## 2012-10-26 ENCOUNTER — Telehealth: Payer: Self-pay | Admitting: *Deleted

## 2012-10-26 NOTE — Telephone Encounter (Signed)
Message copied by Gala Romney on Wed Oct 26, 2012  3:40 PM ------      Message from: Ladene Artist      Created: Tue Oct 25, 2012  8:26 PM       Copy cmet to his nephrologist, has appt. This week ------

## 2012-10-26 NOTE — Telephone Encounter (Signed)
Contacted pt re: name of his nephrologist; Pt didn't know name but will be at [facility] tomorrow.  Contacted [facility] and pt will be seeing Dr. Casimiro Needle.  Routed labs to his office.

## 2012-11-14 ENCOUNTER — Encounter: Payer: Self-pay | Admitting: *Deleted

## 2012-11-15 ENCOUNTER — Encounter: Payer: Self-pay | Admitting: Cardiology

## 2012-11-15 ENCOUNTER — Ambulatory Visit (INDEPENDENT_AMBULATORY_CARE_PROVIDER_SITE_OTHER): Payer: Medicare Other | Admitting: Cardiology

## 2012-11-15 VITALS — BP 132/50 | HR 78 | Ht 66.0 in | Wt 191.6 lb

## 2012-11-15 DIAGNOSIS — R9431 Abnormal electrocardiogram [ECG] [EKG]: Secondary | ICD-10-CM | POA: Insufficient documentation

## 2012-11-15 DIAGNOSIS — I2510 Atherosclerotic heart disease of native coronary artery without angina pectoris: Secondary | ICD-10-CM

## 2012-11-15 DIAGNOSIS — I4891 Unspecified atrial fibrillation: Secondary | ICD-10-CM

## 2012-11-15 DIAGNOSIS — Z9861 Coronary angioplasty status: Secondary | ICD-10-CM

## 2012-11-15 DIAGNOSIS — Z951 Presence of aortocoronary bypass graft: Secondary | ICD-10-CM

## 2012-11-15 DIAGNOSIS — E785 Hyperlipidemia, unspecified: Secondary | ICD-10-CM | POA: Insufficient documentation

## 2012-11-15 DIAGNOSIS — I25810 Atherosclerosis of coronary artery bypass graft(s) without angina pectoris: Secondary | ICD-10-CM

## 2012-11-15 DIAGNOSIS — I10 Essential (primary) hypertension: Secondary | ICD-10-CM

## 2012-11-15 NOTE — Progress Notes (Signed)
PCP: [name], MD  Clinic Note: Chief Complaint  Patient presents with  . ROV 6 months    Swelling in legs-L leg seems to be worse, lightheadedness-when he stands up too quickly   HPI: [name] is a 72 y.o. male with a PMH below who presents today for general followup.  Interestingly, in the interval since I seen him (from November 2013), he was diagnosed with an underwent operation for GIST tumor.  He had postoperative anemia.  This led to transfusions.  He's also had near-syncopal episode due to hypo-glycemia following the operation.  Interval History: He seemed to have tolerated all these procedures colonoscopies endoscopies and operation without any cardiac complications.  His last stress test was in probably 2006- 2007.  ECC done quite well overall from a cardiac standpoint.  No perioperative MI or arrhythmias.  No heart failure.  Now he denies any chest tightness or pressure with pressors.  No dyspnea with rest or exertion.  He is a little fatigued, and has mild edema from Gleevec. The remainder of Cardiovascular ROS is as follows: no chest pain or dyspnea on exertion positive for - irregular heartbeat and palpitations negative for - chest pain, dyspnea on exertion, loss of consciousness, murmur, orthopnea, paroxysmal nocturnal dyspnea, rapid heart rate or shortness of breath: Additional cardiac review of systems: Lightheadedness - only when his blood sugars are down; dizziness - occasional, rare;, syncope/near-syncope - no; TIA/amaurosis fugax - no.  Melena - no, hematochezia no; hematuria - no; nosebleeds - no; claudication - no  Past Medical History  Diagnosis Date  . Hypertension   . Diabetes mellitus without complication   . Stroke 72 years old  . High cholesterol   . History of atrial fibrillation     No recurrence in over 5 years.  Marland Kitchen CAD in native artery 1997    PTCA D1, D2  . S/P CABG x 5 2002    LIMA-LAD, SVG-D1-OM2, SVG-D2, SVG-rPDA  . Abnormal findings  on cardiac catheterization March 2004    Tandem mid and distal RCA stenosis --> PCI below; proximal D1 lesion--> PCI below;   Marland Kitchen CAD S/P percutaneous coronary angioplasty 04/2002    PCI mRCA, dRCA; 3.5 mm x12 mm express BMS overlapping 3.0 mm x 8 mm Zeta BMS proximally; PCI-D1 2.75 mm 4 mm Taxus DES;   . CAD (coronary artery disease) of bypass graft March 2004    Remaining cath findings: SVG-RCA 100% occluded, SVG-D1-OM2 occluded; patent LIMA-LAD and SVG-OM1  . Depression   . Erectile dysfunction   . 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 Memory loss   . Arthritis   . GERD (gastroesophageal reflux disease)     Prior Cardiac Evaluation and Past Surgical History: Past Surgical History  Procedure Laterality Date  . Eye surgery      cataracts bilateral  . Carpal tunnel release      bilateral  . Tonsillectomy    . Penile prosthesis implant  03/15/2012    Procedure: PENILE PROTHESIS INFLATABLE;  Surgeon: Lindaann Slough, MD;  Location: [facility] ORS;  Service: Urology;  Laterality: N/A;  . Circumcision  03/15/2012    Procedure: CIRCUMCISION ADULT;  Surgeon: Lindaann Slough, MD;  Location: [facility] ORS;  Service: Urology;  Laterality: N/A;  . Esophagogastroduodenoscopy N/A 04/06/2012    Procedure: ESOPHAGOGASTRODUODENOSCOPY (EGD);  Surgeon: Graylin Shiver, MD;  Location: [facility] ENDOSCOPY;  Service: Endoscopy;  Laterality: N/A;  . Colonoscopy N/A 04/14/2012  Procedure: COLONOSCOPY;  Surgeon: Barrie Folk, MD;  Location: [facility] ENDOSCOPY;  Service: Endoscopy;  Laterality: N/A;  . Gastric resection N/A 04/15/2012    Procedure: Partial Gastrectomy;  Surgeon: Cherylynn Ridges, MD;  Location: [facility] OR;  Service: General;  Laterality: N/A;  . Coronary artery bypass graft  04/21/2000    LIMA-LAD,SVG-D1 sequential to OM2 , SVG-D2 ,SVG-RPDA  . Cardiac catheterization  1997    PCI -D1 and D2  . Cardiac catheterization  05/02/2002    SEE angioplasty  . Cardiac  catheterization  04/08/2000    LV MILDLY ENLARGED,EF 50%;MITRAL AND AORTIC VALVES  APPEAR NORMAL  . Coronary angioplasty  05/02/2002    mid RCA ,distal RCA and first diagonal branch. RCA 3.50 x 12 mm Express bare-metal stent,Zeta stent 3.o x8 in the more proximal portion;D1 stented with 2.75 x 24 mm Taxus DES stent; showed total occulsion of the RCA vein graft as well as the vein graft to the D1 and OM ,remaining grafts wer normally to OM and LAD    Echocardiogram 2012 - showing normal LV size and function, EF 55-60%.  Mild left atrial dilation.  Otherwise normal.   Allergies  Allergen Reactions  . Morphine And Related Itching    Current Outpatient Prescriptions  Medication Sig Dispense Refill  . amLODipine (NORVASC) 5 MG tablet Take 5 mg by mouth daily.      Marland Kitchen aspirin 325 MG tablet Take 325 mg by mouth daily.      Marland Kitchen atorvastatin (LIPITOR) 40 MG tablet Take 40 mg by mouth daily.      . Cholecalciferol (VITAMIN D-3) 5000 UNITS TABS Take 1 tablet by mouth daily.      . clopidogrel (PLAVIX) 75 MG tablet Take 75 mg by mouth daily.      . feeding supplement (ENSURE COMPLETE) LIQD Take 237 mLs by mouth 2 (two) times daily between meals. Tries to drink 3/day or Boost      . furosemide (LASIX) 20 MG tablet Take 20 mg by mouth daily.       Marland Kitchen glimepiride (AMARYL) 4 MG tablet Take 4 mg by mouth daily.       . hydrochlorothiazide (HYDRODIURIL) 25 MG tablet Take 25 mg by mouth daily.      Marland Kitchen imatinib (GLEEVEC) 400 MG tablet Take 1 tablet (400 mg total) by mouth daily.  30 tablet  3  . insulin NPH-regular (NOVOLIN 70/30) (70-30) 100 UNIT/ML injection Inject into the skin.      Marland Kitchen ketoprofen (ORUDIS) 75 MG capsule Take 75 mg by mouth 3 (three) times daily.      . metFORMIN (GLUCOPHAGE) 1000 MG tablet Take 1,000 mg by mouth 2 (two) times daily with a meal. Hold for BS < 80      . metoprolol tartrate (LOPRESSOR) 25 MG tablet TAKE 1/2 TABLET TWICE DAILY.  90 tablet  1  . Multiple Vitamin (MULTI-VITAMINS)  TABS Take 1 tablet by mouth daily.       Marland Kitchen NITROSTAT 0.4 MG SL tablet Place 0.4 mg under the tongue as needed.      . pantoprazole (PROTONIX) 40 MG tablet Take 40 mg by mouth 2 (two) times daily.       . polyethylene glycol (MIRALAX / GLYCOLAX) packet Take 17 g by mouth daily as needed (He needs 1-2 smooth BM per day.).  60 each  0  . potassium chloride (KLOR-CON) 20 MEQ packet Take 20 mEq by mouth 2 (two) times daily.  180 tablet  1  . QUEtiapine (SEROQUEL) 200 MG tablet Take 1 tablet (200 mg total) by mouth daily at 8 pm.      . RAPAFLO 8 MG CAPS capsule 8 mg daily.       No current facility-administered medications for this visit.    History   Social History Narrative   Divorced. Patient one child daughter. Lives alone-holds furniture and uses walker for ambulation.    Patient is retired. High school education.   Friend comes to home several times day to check on him and fix/bring meals and check glucose      No regular exercise.  Does not drink or smoke.    ROS: A comprehensive Review of Systems - Negative except Symptoms below General ROS: positive for  - fatigue, weight gain and He is notably increase his dietary intake since his surgery.  His back now to his baseline weight.  His been getting a lot of Boost dietary supplement.  He is also not getting a lot of exercise. Psychological ROS: positive for - depression  PHYSICAL EXAM BP 132/50  Pulse 78  Ht 5\' 6"  (1.676 m)  Wt 191 lb 9.6 oz (86.909 kg)  BMI 30.94 kg/m2 General appearance: alert, cooperative, appears stated age, no distress and mildly obese Neck: no adenopathy, no carotid bruit, no JVD and supple, symmetrical, trachea midline Lungs: clear to auscultation bilaterally, normal percussion bilaterally and Nonlabored, clear movement Heart: regular rate and rhythm, S1, S2 normal, no murmur, click, rub or gallop and normal apical impulse Abdomen: soft, non-tender; bowel sounds normal; no masses,  no  organomegaly Extremities: edema Trace to 1+ and no ulcers, gangrene or trophic changes Pulses: 2+ and symmetric Neurologic: Grossly normal; somewhat depressed mood and affect./Relatively flat  [id number] today: Yes Rate: 78 , Rhythm: NSR, frequent PVCs, RBBB, possible anterior MI, age indeterminate.  Recent Labs: They were checked by his primary, but he does not know the results.  Lipids were not checked.  ASSESSMENT / PLAN: CAD S/P percutaneous coronary angioplasty No active symptoms of angina. He remains on aspirin and Plavix area he is on statin, and beta blocker.  Also on amlodipine. No heart failure symptoms, but he is on furosemide due to the large of edema from Gleevec.  S/P CABG x 5 From last cath in 2004 he had a patent LIMA and SVG to OM1, the remaining grafts are closed and PCI was performed to the native vessels. No ongoing, active symptoms.  He tolerated a major surgery without any problems.  Continue current medication regimen. He is not on exam her ARB because of chronic renal insufficiency with creatinine ranging from 1.5 1.8.  HTN (hypertension) His blood pressure was good today.  Continue current medications  Atrial fibrillation I cannot find any record of this on my chart review.  Don't use any recurrence.  Will continue to monitor for this.  If there is no recurrence, I see no reason for admission anticoagulation.  He is on aspirin Plavix for CAD.  That should provide him some protection.  I do not have any recollection of any recurrence.  CAD (coronary artery disease) of bypass graft He is proven that his graphs of the last rubber.  He has not had a stress test in a long time, his ECG is relatively abnormal. Plan: LexiScan Myoview would be appropriate at his next followup appointment.  He wanted to get over his operation a little more before he goes through anything like that.  Abnormal EKG He does  have coronary disease.  We will check a LexiScan Myoview prior  to when I see him back in followup.    Orders Placed This Encounter  Procedures  . Lipid Profile  . Myocardial Perfusion Imaging    Standing Status: Future     Number of Occurrences:      Standing Expiration Date: 11/15/2013    Scheduling Instructions:     Schedule in 6 months prior to appointment    Order Specific Question:  Where should this test be performed    Answer:  [facility]    Order Specific Question:  Type of stress    Answer:  Lexiscan    Order Specific Question:  Patient weight in lbs    Answer:  191  . EKG 12-Lead    Followup: 6 months  DAVID W. Herbie Baltimore, [name], M.S. [facility] [address] [place], [place]  [address]  [phone] Pager # [phone]

## 2012-11-15 NOTE — Assessment & Plan Note (Signed)
From last cath in 2004 he had a patent LIMA and SVG to OM1, the remaining grafts are closed and PCI was performed to the native vessels. No ongoing, active symptoms.  He tolerated a major surgery without any problems.  Continue current medication regimen. He is not on exam her ARB because of chronic renal insufficiency with creatinine ranging from 1.5 1.8.

## 2012-11-15 NOTE — Patient Instructions (Signed)
Your physician wants you to follow-up in 6 month  HARD[place]. Youwill receive a reminder letter in the mail two months in advance. If you don't receive a letter, please call our office to schedule the follow-up appointment.  Your physician has requested that you have a lexiscan myoview. For further information please visit [email or web address] Please follow instruction sheet, as given.   You will have test 6 MONTH BEFORE NEXT APPOINTMENT.

## 2012-11-15 NOTE — Assessment & Plan Note (Addendum)
I cannot find any record of this on my chart review.  Don't use any recurrence.  Will continue to monitor for this.  If there is no recurrence, I see no reason for admission anticoagulation.  He is on aspirin Plavix for CAD.  That should provide him some protection.  I do not have any recollection of any recurrence.

## 2012-11-15 NOTE — Assessment & Plan Note (Signed)
His blood pressure was good today.  Continue current medications

## 2012-11-15 NOTE — Assessment & Plan Note (Signed)
He does have coronary disease.  We will check a LexiScan Myoview prior to when I see him back in followup.

## 2012-11-15 NOTE — Assessment & Plan Note (Signed)
No active symptoms of angina. He remains on aspirin and Plavix area he is on statin, and beta blocker.  Also on amlodipine. No heart failure symptoms, but he is on furosemide due to the large of edema from Gleevec.

## 2012-11-15 NOTE — Assessment & Plan Note (Signed)
He is proven that his graphs of the last rubber.  He has not had a stress test in a long time, his ECG is relatively abnormal. Plan: LexiScan Myoview would be appropriate at his next followup appointment.  He wanted to get over his operation a little more before he goes through anything like that.

## 2012-11-22 ENCOUNTER — Telehealth: Payer: Self-pay | Admitting: Oncology

## 2012-11-22 ENCOUNTER — Encounter: Payer: Self-pay | Admitting: Nurse Practitioner

## 2012-11-22 ENCOUNTER — Ambulatory Visit (HOSPITAL_BASED_OUTPATIENT_CLINIC_OR_DEPARTMENT_OTHER): Payer: Medicare Other | Admitting: Nurse Practitioner

## 2012-11-22 ENCOUNTER — Other Ambulatory Visit (HOSPITAL_BASED_OUTPATIENT_CLINIC_OR_DEPARTMENT_OTHER): Payer: Medicare Other | Admitting: Lab

## 2012-11-22 VITALS — BP 132/69 | HR 66 | Temp 98.0°F | Resp 20 | Ht 66.0 in | Wt 187.7 lb

## 2012-11-22 DIAGNOSIS — C49A0 Gastrointestinal stromal tumor, unspecified site: Secondary | ICD-10-CM

## 2012-11-22 DIAGNOSIS — C166 Malignant neoplasm of greater curvature of stomach, unspecified: Secondary | ICD-10-CM

## 2012-11-22 DIAGNOSIS — IMO0002 Reserved for concepts with insufficient information to code with codable children: Secondary | ICD-10-CM

## 2012-11-22 DIAGNOSIS — N189 Chronic kidney disease, unspecified: Secondary | ICD-10-CM

## 2012-11-22 DIAGNOSIS — E119 Type 2 diabetes mellitus without complications: Secondary | ICD-10-CM

## 2012-11-22 DIAGNOSIS — D649 Anemia, unspecified: Secondary | ICD-10-CM

## 2012-11-22 LAB — CBC WITH DIFFERENTIAL/PLATELET
Basophils Absolute: 0.1 10*3/uL (ref 0.0–0.1)
EOS%: 1.7 % (ref 0.0–7.0)
Eosinophils Absolute: 0.1 10*3/uL (ref 0.0–0.5)
HCT: 35.1 % — ABNORMAL LOW (ref 38.4–49.9)
HGB: 11.5 g/dL — ABNORMAL LOW (ref 13.0–17.1)
MCH: 32 pg (ref 27.2–33.4)
MCV: 97.6 fL (ref 79.3–98.0)
MONO%: 4.3 % (ref 0.0–14.0)
NEUT#: 6.2 10*3/uL (ref 1.5–6.5)
NEUT%: 75 % (ref 39.0–75.0)
RDW: 20.7 % — ABNORMAL HIGH (ref 11.0–14.6)

## 2012-11-22 LAB — COMPREHENSIVE METABOLIC PANEL (CC13)
AST: 35 U/L — ABNORMAL HIGH (ref 5–34)
Albumin: 3.7 g/dL (ref 3.5–5.0)
BUN: 19 mg/dL (ref 7.0–26.0)
Calcium: 9.1 mg/dL (ref 8.4–10.4)
Chloride: 113 mEq/L — ABNORMAL HIGH (ref 98–109)
Creatinine: 1.6 mg/dL — ABNORMAL HIGH (ref 0.7–1.3)
Glucose: 115 mg/dl (ref 70–140)
Potassium: 4.1 mEq/L (ref 3.5–5.1)

## 2012-11-22 NOTE — Patient Instructions (Signed)
For constipation take senokot S 2 tabs daily as needed.

## 2012-11-22 NOTE — Telephone Encounter (Signed)
Gave pt apptr for lab and MD on November 2014

## 2012-11-22 NOTE — Progress Notes (Signed)
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  He presents today for scheduled followup.  He reports feeling well. He overall has a good appetite. He denies nausea/vomiting. No mouth sores. No diarrhea. He is occasionally constipated. He notes improvement in the leg edema. He reports chronic mild periorbital edema prior to beginning Gleevec. No skin rash. He denies shortness of breath. No fever. No abdominal pain. Blood sugars have been "pretty good".   Objective: Blood pressure 132/69, pulse 66, temperature 98 F (36.7 C), temperature source Oral, resp. rate 20, height 5\' 6"  (1.676 m), weight 187 lb 11.2 oz (85.14 kg).  Mild periorbital edema. No thrush or ulcerations. No palpable cervical or supraclavicular lymph nodes. Lungs are clear. No wheezes or rales. Regular cardiac rhythm. Abdomen soft and nontender. No organomegaly. No mass. Trace lower leg edema bilaterally. No skin rash.  Lab Results: Lab Results  Component Value Date   WBC 8.3 11/22/2012   HGB 11.5* 11/22/2012   HCT 35.1* 11/22/2012   MCV 97.6 11/22/2012   PLT 165 11/22/2012    Chemistry:    Chemistry      Component Value Date/Time   NA 148* 11/22/2012 1014   NA 138 05/13/2012 1857   K 4.1 11/22/2012 1014   K 4.1 05/13/2012 1857   CL 110* 08/09/2012 0922   CL 104 05/13/2012 1857   CO2 27 11/22/2012 1014   CO2 20 05/13/2012 1857   BUN 19.0 11/22/2012 1014   BUN 18 05/13/2012 1857   CREATININE 1.6* 11/22/2012 1014   CREATININE 1.32 05/13/2012 1857      Component Value Date/Time   CALCIUM 9.1 11/22/2012 1014   CALCIUM 9.0 05/13/2012 1857   ALKPHOS 89 11/22/2012 1014   ALKPHOS 94 05/13/2012 1857   AST 35* 11/22/2012 1014   AST 19 05/13/2012 1857   ALT 36 11/22/2012 1014   ALT 7 05/13/2012 1857   BILITOT 0.53 11/22/2012 1014   BILITOT 0.3 05/13/2012 1857        Studies/Results: No results found.  Medications: I have reviewed the patient's current medications.  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   Initiation of adjuvant Gleevec on 07/26/2012.  2. Diabetes.  3. History of coronary artery disease.  4. Chronic renal failure.  5. Admission with gastrointestinal bleeding and severe anemia 04/05/2012.  6. Hypotension/failure to thrive when he was here on 05/17/2012. His performance status continues to be improved.  7. 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  8. Admission with symptomatic hypoglycemia in [facility] week of 08/01/2012.   Disposition-he appears stable. He seems to be tolerating the Gleevec well. Plan to continue the same. He will return for a followup visit in 6 weeks.  Plan reviewed with Dr. Truett Perna.   Lonna Cobb ANP/GNP-BC

## 2012-12-05 ENCOUNTER — Other Ambulatory Visit: Payer: Self-pay | Admitting: Oncology

## 2012-12-05 DIAGNOSIS — C494 Malignant neoplasm of connective and soft tissue of abdomen: Secondary | ICD-10-CM

## 2012-12-26 ENCOUNTER — Telehealth: Payer: Self-pay | Admitting: *Deleted

## 2012-12-26 NOTE — Telephone Encounter (Signed)
This RN received call from pt with pt stating " I think they finally got me to the right person to speak with ".  [name] states he is calling to report a change in bowels that occurred last week for approximately 3 days .  States he had " diarrhea " with description of stools as " a little thicker then mashed potatoes" and occurrence was 1-2 times during the day and 1 event at night.  Currently the stools have lessened in occurrence but are still soft.  This note will be directed to Dr Kalman Drape nurse for appropriate communication.

## 2012-12-28 ENCOUNTER — Telehealth: Payer: Self-pay | Admitting: *Deleted

## 2012-12-28 NOTE — Telephone Encounter (Signed)
Left VM that MD is aware. May take Imodium as needed for diarrhea. Call if this does not help. Instructed him to call with update if he needs to take more than 4/day for bowels.

## 2012-12-30 ENCOUNTER — Other Ambulatory Visit: Payer: Self-pay | Admitting: Oncology

## 2012-12-30 DIAGNOSIS — C494 Malignant neoplasm of connective and soft tissue of abdomen: Secondary | ICD-10-CM

## 2012-12-30 NOTE — Telephone Encounter (Signed)
THIS REFILL REQUEST WAS GIVEN TO DR.SHERRILL'S NURSE, SUSAN COWARD,RN. 

## 2013-01-02 ENCOUNTER — Ambulatory Visit (HOSPITAL_BASED_OUTPATIENT_CLINIC_OR_DEPARTMENT_OTHER): Payer: Medicare Other | Admitting: Oncology

## 2013-01-02 ENCOUNTER — Other Ambulatory Visit (HOSPITAL_BASED_OUTPATIENT_CLINIC_OR_DEPARTMENT_OTHER): Payer: Medicare Other | Admitting: Lab

## 2013-01-02 ENCOUNTER — Telehealth: Payer: Self-pay | Admitting: Oncology

## 2013-01-02 VITALS — BP 137/56 | HR 52 | Temp 98.1°F | Resp 18 | Ht 66.0 in | Wt 199.7 lb

## 2013-01-02 DIAGNOSIS — C49A0 Gastrointestinal stromal tumor, unspecified site: Secondary | ICD-10-CM

## 2013-01-02 DIAGNOSIS — C494 Malignant neoplasm of connective and soft tissue of abdomen: Secondary | ICD-10-CM

## 2013-01-02 DIAGNOSIS — N189 Chronic kidney disease, unspecified: Secondary | ICD-10-CM

## 2013-01-02 DIAGNOSIS — R197 Diarrhea, unspecified: Secondary | ICD-10-CM

## 2013-01-02 LAB — COMPREHENSIVE METABOLIC PANEL (CC13)
AST: 21 U/L (ref 5–34)
Albumin: 3.5 g/dL (ref 3.5–5.0)
Alkaline Phosphatase: 81 U/L (ref 40–150)
Anion Gap: 7 mEq/L (ref 3–11)
Calcium: 8.6 mg/dL (ref 8.4–10.4)
Chloride: 116 mEq/L — ABNORMAL HIGH (ref 98–109)
Glucose: 128 mg/dl (ref 70–140)
Potassium: 4.1 mEq/L (ref 3.5–5.1)
Sodium: 147 mEq/L — ABNORMAL HIGH (ref 136–145)
Total Protein: 6.5 g/dL (ref 6.4–8.3)

## 2013-01-02 LAB — CBC WITH DIFFERENTIAL/PLATELET
Basophils Absolute: 0.1 10*3/uL (ref 0.0–0.1)
Eosinophils Absolute: 0.2 10*3/uL (ref 0.0–0.5)
HGB: 11 g/dL — ABNORMAL LOW (ref 13.0–17.1)
MCHC: 32.5 g/dL (ref 32.0–36.0)
MCV: 99.8 fL — ABNORMAL HIGH (ref 79.3–98.0)
MONO%: 5.7 % (ref 0.0–14.0)
NEUT#: 4.7 10*3/uL (ref 1.5–6.5)
RBC: 3.38 10*6/uL — ABNORMAL LOW (ref 4.20–5.82)
RDW: 17.8 % — ABNORMAL HIGH (ref 11.0–14.6)
WBC: 7 10*3/uL (ref 4.0–10.3)
lymph#: 1.6 10*3/uL (ref 0.9–3.3)

## 2013-01-02 MED ORDER — LOPERAMIDE HCL 2 MG PO CAPS: ORAL_CAPSULE | ORAL | Status: DC

## 2013-01-02 NOTE — Progress Notes (Signed)
   [facility]    OFFICE PROGRESS NOTE   INTERVAL HISTORY:   [name] returns as scheduled. He continues Gleevec. He denies rash and swelling. He reports intermittent diarrhea. He is not taking Imodium. He reports the blood sugar has been under better control.  Objective:  Vital signs in last 24 hours:  Blood pressure 137/56, pulse 52, temperature 98.1 F (36.7 C), temperature source Oral, resp. rate 18, height 5\' 6"  (1.676 m), weight 199 lb 11.2 oz (90.583 kg).    HEENT: No thrush or ulcers Resp: Lungs clear bilaterally Cardio: Irregular GI: No hepatosplenomegaly, nontender, no mass Vascular: No leg edema  Skin: Dryness with a mild fine rash over the back     Lab Results:  Lab Results  Component Value Date   WBC 7.0 01/02/2013   HGB 11.0* 01/02/2013   HCT 33.8* 01/02/2013   MCV 99.8* 01/02/2013   PLT 157 01/02/2013   ANC 4.7    Medications: I have reviewed the patient's current medications.  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  Initiation of adjuvant Gleevec on 07/26/2012.  2. Diabetes.  3. History of coronary artery disease.  4. Chronic renal failure.  5. Admission with gastrointestinal bleeding and severe anemia 04/05/2012.  6. Hypotension/failure to thrive when he was here on 05/17/2012. His performance status continues to be improved.  7. 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  8. Admission with symptomatic hypoglycemia in [facility] week of 08/01/2012.  9. Diarrhea-likely secondary to Gleevec, he will try Imodium   Disposition:  He appears stable. He will continue Gleevec. He will try Imodium for the diarrhea. [name] will return for an office visit in 6 weeks.  The heart  rate is irregular today. He reports this is a chronic finding and there is a history of atrial fibrillation. We did not perform an EKG today. We will alert Dr. Julio Sicks and cardiology of the irregular heart rate.   Thornton Papas, MD  01/02/2013  1:41 PM

## 2013-01-02 NOTE — Telephone Encounter (Signed)
gv and printed appt sched and avs for pt for DEc °

## 2013-01-02 NOTE — Patient Instructions (Signed)
Take Imodium for diarrhea as needed. You can buy this over the counter. Take 2 after the first loose stool, then one after each loose stool thereafter. You can take up to 8 tablets a day. Call the office if this does not help.

## 2013-01-25 ENCOUNTER — Other Ambulatory Visit: Payer: Self-pay | Admitting: Oncology

## 2013-02-13 ENCOUNTER — Other Ambulatory Visit (HOSPITAL_BASED_OUTPATIENT_CLINIC_OR_DEPARTMENT_OTHER): Payer: Medicare Other

## 2013-02-13 ENCOUNTER — Ambulatory Visit (HOSPITAL_BASED_OUTPATIENT_CLINIC_OR_DEPARTMENT_OTHER): Payer: Medicare Other | Admitting: Nurse Practitioner

## 2013-02-13 ENCOUNTER — Telehealth: Payer: Self-pay | Admitting: Oncology

## 2013-02-13 VITALS — BP 132/55 | HR 60 | Temp 97.0°F | Resp 17 | Ht 66.0 in | Wt 192.3 lb

## 2013-02-13 DIAGNOSIS — R1013 Epigastric pain: Secondary | ICD-10-CM

## 2013-02-13 DIAGNOSIS — E119 Type 2 diabetes mellitus without complications: Secondary | ICD-10-CM

## 2013-02-13 DIAGNOSIS — C494 Malignant neoplasm of connective and soft tissue of abdomen: Secondary | ICD-10-CM

## 2013-02-13 DIAGNOSIS — N189 Chronic kidney disease, unspecified: Secondary | ICD-10-CM

## 2013-02-13 DIAGNOSIS — R197 Diarrhea, unspecified: Secondary | ICD-10-CM

## 2013-02-13 DIAGNOSIS — C49A0 Gastrointestinal stromal tumor, unspecified site: Secondary | ICD-10-CM

## 2013-02-13 LAB — CBC WITH DIFFERENTIAL/PLATELET
BASO%: 0.6 % (ref 0.0–2.0)
Basophils Absolute: 0 10*3/uL (ref 0.0–0.1)
Eosinophils Absolute: 0.2 10*3/uL (ref 0.0–0.5)
LYMPH%: 32.4 % (ref 14.0–49.0)
MCHC: 33 g/dL (ref 32.0–36.0)
MCV: 101.7 fL — ABNORMAL HIGH (ref 79.3–98.0)
MONO%: 5.9 % (ref 0.0–14.0)
NEUT#: 4.4 10*3/uL (ref 1.5–6.5)
Platelets: 153 10*3/uL (ref 140–400)
RBC: 3.53 10*6/uL — ABNORMAL LOW (ref 4.20–5.82)
RDW: 17 % — ABNORMAL HIGH (ref 11.0–14.6)
WBC: 7.5 10*3/uL (ref 4.0–10.3)
lymph#: 2.4 10*3/uL (ref 0.9–3.3)

## 2013-02-13 LAB — COMPREHENSIVE METABOLIC PANEL (CC13)
ALT: 10 U/L (ref 0–55)
AST: 14 U/L (ref 5–34)
Alkaline Phosphatase: 58 U/L (ref 40–150)
BUN: 12.4 mg/dL (ref 7.0–26.0)
Potassium: 4.3 mEq/L (ref 3.5–5.1)
Sodium: 147 mEq/L — ABNORMAL HIGH (ref 136–145)
Total Bilirubin: 0.48 mg/dL (ref 0.20–1.20)
Total Protein: 6.2 g/dL — ABNORMAL LOW (ref 6.4–8.3)

## 2013-02-13 NOTE — Progress Notes (Signed)
OFFICE PROGRESS NOTE  Interval history:  [name] returns for scheduled followup. He continues adjuvant Gleevec. He denies nausea/vomiting. He has occasional loose stools. Stools are intermittently "dark". He thinks this may be related to oral iron. He denies leg swelling. He has stable dyspnea on exertion. No cough. No skin rash. He denies mouth sores. He states blood sugars are "pretty good".   Objective: Filed Vitals:   02/13/13 1113  BP:   Pulse: 60  Temp:   Resp:      Oropharynx is without thrush or ulceration. Lungs are clear. Irregular cardiac rhythm. Abdomen soft and nontender. No organomegaly. No mass. No leg edema.  Lab Results: Lab Results  Component Value Date   WBC 7.5 02/13/2013   HGB 11.8* 02/13/2013   HCT 35.9* 02/13/2013   MCV 101.7* 02/13/2013   PLT 153 02/13/2013   NEUTROABS 4.4 02/13/2013    Chemistry:    Chemistry      Component Value Date/Time   NA 147* 02/13/2013 1023   NA 138 05/13/2012 1857   K 4.3 02/13/2013 1023   K 4.1 05/13/2012 1857   CL 110* 08/09/2012 0922   CL 104 05/13/2012 1857   CO2 27 02/13/2013 1023   CO2 20 05/13/2012 1857   BUN 12.4 02/13/2013 1023   BUN 18 05/13/2012 1857   CREATININE 1.5* 02/13/2013 1023   CREATININE 1.32 05/13/2012 1857      Component Value Date/Time   CALCIUM 8.5 02/13/2013 1023   CALCIUM 9.0 05/13/2012 1857   ALKPHOS 58 02/13/2013 1023   ALKPHOS 94 05/13/2012 1857   AST 14 02/13/2013 1023   AST 19 05/13/2012 1857   ALT 10 02/13/2013 1023   ALT 7 05/13/2012 1857   BILITOT 0.48 02/13/2013 1023   BILITOT 0.3 05/13/2012 1857       Studies/Results: No results found.  Medications: I have reviewed the patient's current medications.  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  Initiation of adjuvant Gleevec on 07/26/2012.  2. Diabetes.  3. History of  coronary artery disease.  4. Chronic renal failure.  5. Admission with gastrointestinal bleeding and severe anemia 04/05/2012.  6. Hypotension/failure to thrive when he was here on 05/17/2012. His performance status continues to be improved.  7. 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  8. Admission with symptomatic hypoglycemia in [facility] week of 08/01/2012.  9. Diarrhea-likely secondary to Gleevec. He takes Imodium as needed.    Dispositon-he appears stable. He will continue Gleevec. He will return for a followup visit in 6 weeks.  Plan reviewed with Dr. Truett Perna.   Lonna Cobb ANP/GNP-BC

## 2013-02-13 NOTE — Telephone Encounter (Signed)
gave pt appt for lab and MD on February 2015 °

## 2013-02-15 ENCOUNTER — Ambulatory Visit (INDEPENDENT_AMBULATORY_CARE_PROVIDER_SITE_OTHER): Payer: Medicare Other | Admitting: Cardiology

## 2013-02-15 ENCOUNTER — Encounter: Payer: Self-pay | Admitting: Cardiology

## 2013-02-15 VITALS — BP 140/60 | HR 79 | Ht 65.75 in | Wt 189.7 lb

## 2013-02-15 DIAGNOSIS — I2510 Atherosclerotic heart disease of native coronary artery without angina pectoris: Secondary | ICD-10-CM

## 2013-02-15 DIAGNOSIS — R413 Other amnesia: Secondary | ICD-10-CM

## 2013-02-15 DIAGNOSIS — Z9861 Coronary angioplasty status: Secondary | ICD-10-CM

## 2013-02-15 DIAGNOSIS — R9431 Abnormal electrocardiogram [ECG] [EKG]: Secondary | ICD-10-CM

## 2013-02-15 DIAGNOSIS — Z79899 Other long term (current) drug therapy: Secondary | ICD-10-CM

## 2013-02-15 DIAGNOSIS — I10 Essential (primary) hypertension: Secondary | ICD-10-CM

## 2013-02-15 DIAGNOSIS — IMO0002 Reserved for concepts with insufficient information to code with codable children: Secondary | ICD-10-CM

## 2013-02-15 DIAGNOSIS — I4891 Unspecified atrial fibrillation: Secondary | ICD-10-CM

## 2013-02-15 DIAGNOSIS — R627 Adult failure to thrive: Secondary | ICD-10-CM

## 2013-02-15 DIAGNOSIS — E785 Hyperlipidemia, unspecified: Secondary | ICD-10-CM

## 2013-02-15 DIAGNOSIS — D499 Neoplasm of unspecified behavior of unspecified site: Secondary | ICD-10-CM

## 2013-02-15 DIAGNOSIS — E119 Type 2 diabetes mellitus without complications: Secondary | ICD-10-CM

## 2013-02-15 DIAGNOSIS — I25810 Atherosclerosis of coronary artery bypass graft(s) without angina pectoris: Secondary | ICD-10-CM

## 2013-02-15 NOTE — Assessment & Plan Note (Signed)
This appears to have resolved- 40lb wgt gain since his surgery

## 2013-02-15 NOTE — Progress Notes (Signed)
02/15/2013 [name]   May 07, 1940  [id number]  Primary Physicia OSEI-BONSU,GEORGE, MD Primary Cardiologist: Dr Herbie Baltimore  HPI:  72 y/o male with a history of CAD. He had CABG X 5 in 2002. In 2004 he had an RCA DES. At that time he had a patent LIMA-LAD and SVG-OM1. His SVG-Dx-OM2 and his SVG-RCA were occluded. He has done well since from a cardiac standpoint. 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 After this he had failure to thrive and depression. He did have a Neuro work up that showed SVD on CT. He was admitted in June in [facility] with hypoglycemia. All this seems to have resolved and he says he has gained 40 lbs since his surgery. He saw Dr Herbie Baltimore in Sept and Dr Herbie Baltimore had mentioned getting a Myoview when the pt felt fully recovered.    Current Outpatient Prescriptions  Medication Sig Dispense Refill  . amLODipine (NORVASC) 5 MG tablet Take 10 mg by mouth daily.       Marland Kitchen aspirin 325 MG tablet Take 325 mg by mouth daily.      Marland Kitchen atorvastatin (LIPITOR) 40 MG tablet Take 40 mg by mouth daily.      . Cholecalciferol (VITAMIN D-3) 5000 UNITS TABS Take 1 tablet by mouth daily.      . clopidogrel (PLAVIX) 75 MG tablet Take 75 mg by mouth daily.      . DULoxetine (CYMBALTA) 60 MG capsule       . ferrous sulfate 325 (65 FE) MG EC tablet Take 325 mg by mouth daily.      . furosemide (LASIX) 20 MG tablet Take 20 mg by mouth daily.       Marland Kitchen GLEEVEC 400 MG tablet TAKE 1 TABLET BY MOUTH ONCE DAILY  30 tablet  0  . glimepiride (AMARYL) 4 MG tablet Take 4 mg by mouth daily.       . hydrochlorothiazide (HYDRODIURIL) 25 MG tablet       . insulin NPH-regular (NOVOLIN 70/30) (70-30) 100 UNIT/ML injection Inject into the skin.      Marland Kitchen ketoprofen (ORUDIS) 75 MG capsule Take 75 mg by mouth 3 (three) times daily.      Marland Kitchen loperamide (IMODIUM) 2 MG capsule Take 2 capsules after the first loose stool. Take one after every loose stool after that. May take up to 8 per day.  30 capsule  0  . metoprolol  tartrate (LOPRESSOR) 25 MG tablet TAKE 1/2 TABLET TWICE DAILY.  90 tablet  1  . Multiple Vitamin (MULTI-VITAMINS) TABS Take 1 tablet by mouth daily.       Marland Kitchen NITROSTAT 0.4 MG SL tablet Place 0.4 mg under the tongue as needed.      . pantoprazole (PROTONIX) 40 MG tablet Take 40 mg by mouth 2 (two) times daily.       . potassium chloride SA (K-DUR,KLOR-CON) 20 MEQ tablet TAKE 1 TABLET TWICE DAILY  180 tablet  0  . QUEtiapine (SEROQUEL) 200 MG tablet Take 1 tablet (200 mg total) by mouth daily at 8 pm.      . RAPAFLO 8 MG CAPS capsule 8 mg daily.      . traZODone (DESYREL) 100 MG tablet        No current facility-administered medications for this visit.    Allergies  Allergen Reactions  . Morphine And Related Itching    History   Social History  . Marital Status: Divorced    Spouse Name: N/A  Number of Children: 1  . Years of Education: 12   Occupational History  . retired    Social History Main Topics  . Smoking status: Never Smoker   . Smokeless tobacco: Never Used  . Alcohol Use: No  . Drug Use: No  . Sexual Activity: Not on file   Other Topics Concern  . Not on file   Social History Narrative   Divorced. Patient one child daughter. Lives alone-holds furniture and uses walker for ambulation.    Patient is retired. High school education.   Friend comes to home several times day to check on him and fix/bring meals and check glucose      No regular exercise.  Does not drink or smoke.     Review of Systems: General: negative for chills, fever, night sweats or weight changes.  Cardiovascular: negative for chest pain, dyspnea on exertion, edema, orthopnea, palpitations, paroxysmal nocturnal dyspnea or shortness of breath Dermatological: negative for rash Respiratory: negative for cough or wheezing Urologic: negative for hematuria Abdominal: negative for nausea, vomiting, diarrhea, bright red blood per rectum, melena, or hematemesis Neurologic: negative for visual  changes, syncope, or dizziness All other systems reviewed and are otherwise negative except as noted above.    Blood pressure 140/60, pulse 79, height 5' 5.75" (1.67 m), weight 189 lb 11.2 oz (86.047 kg).  General appearance: alert, cooperative, no distress and mildly obese Neck: no carotid bruit and no JVD Lungs: clear to auscultation bilaterally Heart: regular rate and rhythm  EKG NSR, IVCD, TWI, PVCs  ASSESSMENT AND PLAN:   CAD S/P CABG X 5 '02. RCA DES '04 No angina.   Abnormal EKG IVCD, TWI, PVCs  HTN (hypertension) Controlled  FTT (failure to thrive) in adult This appears to have resolved- 40lb wgt gain since his surgery  Memory loss SVD by CT. He has seen a neurolgist  Gastric Stromal cell tumor- surgery Feb 2014 Followed by Dr Truett Perna  Diabetes- Type 2 IDDM .  Dyslipidemia, goal LDL below 70 Will check lipids and C-met   PLAN: He has not had chest pain but he has known CAD and has not had a functional study since his last PCI in 2004. His EKG is abnormal. We will schedule a Lexiscan Myoview and at that time will check a C-met and lipid panel. He can follow up in 6 months with Dr Herbie Baltimore unless his Myoview suggests otherwise.  [name] KPA-C 02/15/2013 11:17 AM

## 2013-02-15 NOTE — Assessment & Plan Note (Signed)
Will check lipids and C-met

## 2013-02-15 NOTE — Patient Instructions (Addendum)
Your physician has requested that you have a lexiscan myoview. For further information please visit [email or web address] Please follow instruction sheet, as given.  NEED A FASTING LIPID & CMET AM OF LEXISCAN  Your physician recommends that you schedule a follow-up appointment in: with Dr Herbie Baltimore in 6 months

## 2013-02-15 NOTE — Assessment & Plan Note (Signed)
Controlled.  

## 2013-02-15 NOTE — Assessment & Plan Note (Signed)
No angina 

## 2013-02-15 NOTE — Assessment & Plan Note (Signed)
IVCD, TWI, PVCs

## 2013-02-15 NOTE — Assessment & Plan Note (Signed)
SVD by CT. He has seen a neurolgist

## 2013-02-15 NOTE — Assessment & Plan Note (Signed)
Followed by Dr Sherrill.  

## 2013-02-16 HISTORY — PX: NM MYOVIEW LTD: HXRAD82

## 2013-02-17 ENCOUNTER — Encounter: Payer: Self-pay | Admitting: Cardiology

## 2013-02-24 ENCOUNTER — Ambulatory Visit (HOSPITAL_COMMUNITY)
Admission: RE | Admit: 2013-02-24 | Discharge: 2013-02-24 | Disposition: A | Discharge status: Home / Self Care | Payer: Medicare Other | Source: Ambulatory Visit | Attending: Cardiovascular Disease | Admitting: Cardiovascular Disease

## 2013-02-24 DIAGNOSIS — I25810 Atherosclerosis of coronary artery bypass graft(s) without angina pectoris: Secondary | ICD-10-CM | POA: Insufficient documentation

## 2013-02-24 DIAGNOSIS — R9431 Abnormal electrocardiogram [ECG] [EKG]: Secondary | ICD-10-CM | POA: Insufficient documentation

## 2013-02-24 MED ORDER — TECHNETIUM TC 99M SESTAMIBI GENERIC - CARDIOLITE
31.2000 | Freq: Once | INTRAVENOUS | Status: AC | PRN
  Administered 2013-02-24: 31.2 via INTRAVENOUS

## 2013-02-24 MED ORDER — TECHNETIUM TC 99M SESTAMIBI GENERIC - CARDIOLITE
10.6000 | Freq: Once | INTRAVENOUS | Status: AC | PRN
  Administered 2013-02-24: 11 via INTRAVENOUS

## 2013-02-24 MED ORDER — REGADENOSON 0.4 MG/5ML IV SOLN
0.4000 mg | Freq: Once | INTRAVENOUS | Status: AC
  Administered 2013-02-24: 0.4 mg via INTRAVENOUS

## 2013-02-24 NOTE — Procedures (Addendum)
[facility] [address] [place] [id number] [phone]  Cardiology Nuclear Med Study  [name] is a 73 y.o. male     MRN : [id number]     DOB: December 27, 1940  Procedure Date: 02/24/2013  Nuclear Med Background Indication for Stress Test:  Evaluation for Ischemia and Abnormal EKG History:  CAD;CABG X5-2002;PTCA-04/2002;ACRF;Anemia;Lactic Acidosis;Leukocytosis;PAF;FTT;Hypokalemia;Hypoglycemia Cardiac Risk Factors: CVA, Family History - CAD, Hypertension, IDDM Type 2, Lipids, Overweight and Pt had a stroke at age 10  Symptoms:  Dizziness, DOE and Light-Headedness   Nuclear Pre-Procedure Caffeine/Decaff Intake:  9:00pm NPO After: 7:00am   IV Site: R Hand  IV 0.9% NS with Angio Cath:  22g  Chest Size (in):  42"  IV Started by: Azucena Cecil, RN  Height: 5\' 7"  (1.702 m)  Cup Size: n/a  BMI:  Body mass index is 29.59 kg/(m^2). Weight:  189 lb (85.73 kg)   Tech Comments:  n/a    Nuclear Med Study 1 or 2 day study: 1 day  Stress Test Type:  [place] Provider:  Glenetta Hew, MD   Resting Radionuclide: Technetium 21m Sestamibi  Resting Radionuclide Dose: 10.6 mCi   Stress Radionuclide:  Technetium 54m Sestamibi  Stress Radionuclide Dose: 31.2 mCi           Stress Protocol Rest HR:65 Stress HR: 73  Rest BP:118/67 Stress BP:118/67  Exercise Time (min): n/a METS: n/a          Dose of Adenosine (mg):  n/a Dose of Lexiscan: 0.4 mg  Dose of Atropine (mg): n/a Dose of Dobutamine: n/a mcg/kg/min (at max HR)  Stress Test Technologist: Mellody Memos, CCT Nuclear Technologist: Imagene Riches, CNMT   Rest Procedure:  Myocardial perfusion imaging was performed at rest 45 minutes following the intravenous administration of Technetium 74m Sestamibi. Stress Procedure:  The patient received IV Lexiscan 0.4 mg over 15-seconds.  Technetium 93m Sestamibi injected at 30-seconds.  There were no significant changes  with Lexiscan.  Quantitative spect images were obtained after a 45 minute delay.  Transient Ischemic Dilatation (Normal <1.22):  1.02 Lung/Heart Ratio (Normal <0.45):  0.38 QGS EDV:  n/a ml QGS ESV:  n/a ml LV Ejection Fraction: Study not gated  Signed by  Imagene Riches, CNMT  PHYSICIAN INTERPRETATION  Rest ECG: NSR-RBBB and Frequent PVCc  Stress ECG: No significant change from baseline ECG  QPS Raw Data Images:  Mild diaphragmatic attenuation.  Normal left ventricular size. Stress Images:  Mildly reduced inferior wall uptake in a diaphragmatic attenuation pattern. Rest Images:  Comparison with the stress images reveals no significant change. Subtraction (SDS):   There is a fixed inferior defect that is most consistent with diaphragmatic attenuation.      No reversibility is appreciated.   There is no evidence of scar or ischemia.  Impression Exercise Capacity:  Lexiscan with no exercise. BP Response:  Normal blood pressure response. Clinical Symptoms:  No significant symptoms noted. ECG Impression:  No significant ECG changes with Lexiscan. There are scattered PVCs. No significant ST segment change suggestive of ischemia. Comparison with Prior Nuclear Study: No images to compare  Overall Impression:  Low risk stress nuclear study with mild diapragmatic attenuation..  LV Wall Motion:  Not assessed due to Ectopy   HARDING,DAVID W, MD  02/24/2013 4:15 PM

## 2013-02-27 ENCOUNTER — Other Ambulatory Visit: Payer: Self-pay | Admitting: Oncology

## 2013-02-27 ENCOUNTER — Other Ambulatory Visit: Payer: Self-pay | Admitting: Internal Medicine

## 2013-02-27 DIAGNOSIS — C494 Malignant neoplasm of connective and soft tissue of abdomen: Secondary | ICD-10-CM

## 2013-02-27 NOTE — Telephone Encounter (Signed)
Rx was sent to pharmacy electronically. 

## 2013-03-27 ENCOUNTER — Other Ambulatory Visit (HOSPITAL_BASED_OUTPATIENT_CLINIC_OR_DEPARTMENT_OTHER): Payer: Medicare Other

## 2013-03-27 ENCOUNTER — Ambulatory Visit (HOSPITAL_BASED_OUTPATIENT_CLINIC_OR_DEPARTMENT_OTHER): Payer: Medicare Other | Admitting: Oncology

## 2013-03-27 ENCOUNTER — Telehealth: Payer: Self-pay | Admitting: Oncology

## 2013-03-27 VITALS — BP 115/42 | HR 57 | Temp 97.6°F | Resp 17 | Ht 67.0 in | Wt 190.0 lb

## 2013-03-27 DIAGNOSIS — N189 Chronic kidney disease, unspecified: Secondary | ICD-10-CM

## 2013-03-27 DIAGNOSIS — R197 Diarrhea, unspecified: Secondary | ICD-10-CM

## 2013-03-27 DIAGNOSIS — E119 Type 2 diabetes mellitus without complications: Secondary | ICD-10-CM

## 2013-03-27 DIAGNOSIS — C49A0 Gastrointestinal stromal tumor, unspecified site: Secondary | ICD-10-CM

## 2013-03-27 DIAGNOSIS — C494 Malignant neoplasm of connective and soft tissue of abdomen: Secondary | ICD-10-CM

## 2013-03-27 DIAGNOSIS — IMO0002 Reserved for concepts with insufficient information to code with codable children: Secondary | ICD-10-CM

## 2013-03-27 LAB — CBC WITH DIFFERENTIAL/PLATELET
BASO%: 0.3 % (ref 0.0–2.0)
BASOS ABS: 0 10*3/uL (ref 0.0–0.1)
EOS%: 3.4 % (ref 0.0–7.0)
Eosinophils Absolute: 0.3 10*3/uL (ref 0.0–0.5)
HCT: 35.9 % — ABNORMAL LOW (ref 38.4–49.9)
HEMOGLOBIN: 12 g/dL — AB (ref 13.0–17.1)
LYMPH#: 1.8 10*3/uL (ref 0.9–3.3)
LYMPH%: 24.4 % (ref 14.0–49.0)
MCH: 33.8 pg — AB (ref 27.2–33.4)
MCHC: 33.4 g/dL (ref 32.0–36.0)
MCV: 101.1 fL — AB (ref 79.3–98.0)
MONO#: 0.4 10*3/uL (ref 0.1–0.9)
MONO%: 4.7 % (ref 0.0–14.0)
NEUT#: 5 10*3/uL (ref 1.5–6.5)
NEUT%: 67.2 % (ref 39.0–75.0)
Platelets: 159 10*3/uL (ref 140–400)
RBC: 3.55 10*6/uL — ABNORMAL LOW (ref 4.20–5.82)
RDW: 15.4 % — AB (ref 11.0–14.6)
WBC: 7.4 10*3/uL (ref 4.0–10.3)

## 2013-03-27 LAB — COMPREHENSIVE METABOLIC PANEL (CC13)
ALBUMIN: 3.6 g/dL (ref 3.5–5.0)
ALK PHOS: 61 U/L (ref 40–150)
ALT: 16 U/L (ref 0–55)
AST: 20 U/L (ref 5–34)
Anion Gap: 8 mEq/L (ref 3–11)
BUN: 12.6 mg/dL (ref 7.0–26.0)
CO2: 26 mEq/L (ref 22–29)
Calcium: 8.5 mg/dL (ref 8.4–10.4)
Chloride: 112 mEq/L — ABNORMAL HIGH (ref 98–109)
Creatinine: 1.6 mg/dL — ABNORMAL HIGH (ref 0.7–1.3)
Glucose: 154 mg/dl — ABNORMAL HIGH (ref 70–140)
POTASSIUM: 3.6 meq/L (ref 3.5–5.1)
Sodium: 145 mEq/L (ref 136–145)
Total Bilirubin: 0.47 mg/dL (ref 0.20–1.20)
Total Protein: 6 g/dL — ABNORMAL LOW (ref 6.4–8.3)

## 2013-03-27 NOTE — Progress Notes (Signed)
   [place]    OFFICE PROGRESS NOTE   INTERVAL HISTORY:   Mr. Noyce returns for scheduled followup of the gastrointestinal stromal tumor. He continues [place]. He reports diarrhea approximately once daily, relieved with Imodium . No other complaint. He reports undergoing a negative cardiac evaluation last month.  Objective:  Vital signs in last 24 hours:  Blood pressure 115/42, pulse 57, temperature 97.6 F (36.4 C), temperature source Oral, resp. rate 17, height 5\' 7"  (1.702 m), weight 190 lb (86.183 kg), SpO2 98.00%.    HEENT: No thrush or ulcers. Mild periorbital edema. Resp: Lungs clear bilaterally Cardio: Irregular GI: No hepatomegaly, nontender, no mass Vascular: No leg edema  Skin: No rash   Lab Results:  Lab Results  Component Value Date   WBC 7.4 03/27/2013   HGB 12.0* 03/27/2013   HCT 35.9* 03/27/2013   MCV 101.1* 03/27/2013   PLT 159 03/27/2013   NEUTROABS 5.0 03/27/2013      Medications: I have reviewed the patient's current medications.  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  Initiation of adjuvant Gleevec on 07/26/2012.  2. Diabetes.  3. History of coronary artery disease.  4. Chronic renal failure.  5. Admission with gastrointestinal bleeding and severe anemia 04/05/2012.  6. Hypotension/failure to thrive when he was here on 05/17/2012. His performance status continues to be improved.  7. 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  8. Admission with symptomatic hypoglycemia in [facility] week of 08/01/2012.  9. Diarrhea-likely secondary to [place]. He takes Imodium as needed.    Disposition:  Mr. Grundman appears stable. He will continue Gleevec. He will return for an office and lab visit  on 05/01/2013.   Betsy Coder, MD  03/27/2013  12:21 PM

## 2013-03-27 NOTE — Telephone Encounter (Signed)
v and printed appt sched and avs for pt for March

## 2013-03-31 ENCOUNTER — Other Ambulatory Visit: Payer: Self-pay | Admitting: Oncology

## 2013-04-07 ENCOUNTER — Ambulatory Visit (INDEPENDENT_AMBULATORY_CARE_PROVIDER_SITE_OTHER): Payer: Medicare Other | Admitting: Neurology

## 2013-04-07 ENCOUNTER — Encounter: Payer: Self-pay | Admitting: Neurology

## 2013-04-07 VITALS — BP 120/68 | HR 70 | Ht 65.0 in | Wt 183.0 lb

## 2013-04-07 DIAGNOSIS — I25810 Atherosclerosis of coronary artery bypass graft(s) without angina pectoris: Secondary | ICD-10-CM

## 2013-04-07 DIAGNOSIS — D72829 Elevated white blood cell count, unspecified: Secondary | ICD-10-CM

## 2013-04-07 NOTE — Progress Notes (Signed)
HPI:  [name] is alone at visit for following up of memory loss.  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 He also has left the stove on, or has forgotten to close his garage door , or the car door,  he also does his own finances without much difficulties. He also owns 12 rental objects in [place], and does the finances for those all  by himself  The patient has a past medical history of diabetes, hypertension and heart disease. His diabetes is uncontrolled , his last A1c was  9.1%. He states that  he forgets to take his medication sometimes. He is on  4 meds for his diabetes.    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   He also had acute onset of diplopia in June 2013, presented to [facility] emergency room,, MRI of the brain with and without contrast, has demonstrated chronic moderate periventricular white matter disease, no acute lesions.  Laboratory evaluation has demonstrated mild elevated creatinine 1.4, glucose of 117, WBC 13.7, he denies vertigo, lateralized motor or sensory deficit. 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 12. 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   He was admitted on April 29 2012, with failure to thrive and acute renal failure. 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 He was evaluated by gastroenterology and surgery while in the hospital and had transient parenteral nutrition,  He is now discharge home, his daughter, and his son-in-law came to check on him regularly,  UPDATE Feb 20th 2015: Last visit was in May 2014, He is taking Gleevac, his DM is under better control, he has good appetite, he could not sleep well, he takes seroquel, it does help his sleeping. He still has mild memory trouble, he still lives by himself, he drives to clinic today.    Review of Systems  Out of a complete 14 system review, the patient complains of only the following symptoms, and all other reviewed systems are negative.  Double vision,   Physical Exam  General: Patient is well developed and well groomed. Patient is tired looking Head: Symmetric facial features.  Neck: supple no carotid bruits Respiratory: clear to auscultation bilaterally Cardiovascular: regular rate rhythm Skin:  No rash, no bruising  Neurologic Exam  Mental Status: tired looking elderly male, cooperative to history, talking, and casual conversation. MMSE 24/30 , he has difficulty spell world backwards, could not write a sentence, or copy design, missed 1 out of 3 recall. Cranial Nerves: CN II-XII pupils were equal round reactive to light. Visual fields were full on  confrontational test.  Facial sensation and strength were normal.  Hearing was intact to finger rubbing bilaterally.  Uvula tongue were midline.  Head turning and shoulder shrugging were normal and symmetric.  Tongue protrusion into the cheeks strength were normal.  Motor: Normal tone, bulk, and strength. Sensory: Intact and symmetric to light touch Coordination: Normal finger-to-nose, heel-to-shin.  There was no dysmetria noticed. Gait and Station: Narrow based gait,  Mildly atalgic, moderate difficulty with tandem Reflexes: Deep tendon reflexes: Biceps: 2/2, Brachioradialis: 2/2, Triceps: 2/2, Pateller: 1/1, Achilles:absent.  Plantar responses are flexor.   Assessment and Plan:  73 year old [place] American male with vascular risk factor of hypertension, diabetes, hyperlipidemia, insideous  onset of short-term memory trouble,MMSE 24/30 ,  Hx of  GIST tumor s/p resection, malnutrition, on Gleevac now.  1. 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 2. continue moderate exercise, increase by mouth intake . 3. Return to clinic with [name] in one year

## 2013-04-12 ENCOUNTER — Telehealth: Payer: Self-pay | Admitting: *Deleted

## 2013-04-12 NOTE — Telephone Encounter (Signed)
Call from pt asking if [place] could be causing his hair to fall out. It is a rare s/e of Gleevec. Medications and diet may also be a factor. Pt voiced understanding.

## 2013-04-17 ENCOUNTER — Telehealth: Payer: Self-pay | Admitting: Neurology

## 2013-04-17 NOTE — Telephone Encounter (Signed)
Patient calling to state that he has been summoned for jury duty and is wondering if it is okay for him to do that. Patient states he is still losing his keys and misplacing things. Please call patient and advise.

## 2013-04-17 NOTE — Telephone Encounter (Signed)
Patient calling stating that he has been summoned for jury duty. Patient wondering if it is ok for him to do this. Please advise.

## 2013-04-17 NOTE — Telephone Encounter (Signed)
Dana: Please call patient, a letter has been generate to excuse him from jury duty

## 2013-04-24 NOTE — Telephone Encounter (Signed)
Called patient and he will come and pick up Jury Note.

## 2013-05-01 ENCOUNTER — Ambulatory Visit (HOSPITAL_BASED_OUTPATIENT_CLINIC_OR_DEPARTMENT_OTHER): Payer: Medicare Other | Admitting: Nurse Practitioner

## 2013-05-01 ENCOUNTER — Telehealth: Payer: Self-pay | Admitting: Oncology

## 2013-05-01 ENCOUNTER — Other Ambulatory Visit (HOSPITAL_BASED_OUTPATIENT_CLINIC_OR_DEPARTMENT_OTHER): Payer: Medicare Other

## 2013-05-01 VITALS — BP 124/62 | HR 52 | Temp 97.9°F | Resp 18 | Ht 65.0 in | Wt 190.0 lb

## 2013-05-01 DIAGNOSIS — R197 Diarrhea, unspecified: Secondary | ICD-10-CM

## 2013-05-01 DIAGNOSIS — C494 Malignant neoplasm of connective and soft tissue of abdomen: Secondary | ICD-10-CM

## 2013-05-01 DIAGNOSIS — IMO0002 Reserved for concepts with insufficient information to code with codable children: Secondary | ICD-10-CM

## 2013-05-01 DIAGNOSIS — N189 Chronic kidney disease, unspecified: Secondary | ICD-10-CM

## 2013-05-01 LAB — COMPREHENSIVE METABOLIC PANEL (CC13)
ALBUMIN: 3.5 g/dL (ref 3.5–5.0)
ALK PHOS: 71 U/L (ref 40–150)
ALT: 10 U/L (ref 0–55)
AST: 17 U/L (ref 5–34)
Anion Gap: 9 mEq/L (ref 3–11)
BILIRUBIN TOTAL: 0.46 mg/dL (ref 0.20–1.20)
BUN: 13.1 mg/dL (ref 7.0–26.0)
CO2: 25 mEq/L (ref 22–29)
Calcium: 8.7 mg/dL (ref 8.4–10.4)
Chloride: 113 mEq/L — ABNORMAL HIGH (ref 98–109)
Creatinine: 1.5 mg/dL — ABNORMAL HIGH (ref 0.7–1.3)
Glucose: 134 mg/dl (ref 70–140)
Potassium: 3.6 mEq/L (ref 3.5–5.1)
Sodium: 146 mEq/L — ABNORMAL HIGH (ref 136–145)
Total Protein: 6.3 g/dL — ABNORMAL LOW (ref 6.4–8.3)

## 2013-05-01 LAB — CBC WITH DIFFERENTIAL/PLATELET
BASO%: 0.5 % (ref 0.0–2.0)
BASOS ABS: 0 10*3/uL (ref 0.0–0.1)
EOS%: 3.8 % (ref 0.0–7.0)
Eosinophils Absolute: 0.3 10*3/uL (ref 0.0–0.5)
HCT: 38 % — ABNORMAL LOW (ref 38.4–49.9)
HEMOGLOBIN: 12.6 g/dL — AB (ref 13.0–17.1)
LYMPH%: 26.5 % (ref 14.0–49.0)
MCH: 34.4 pg — AB (ref 27.2–33.4)
MCHC: 33.2 g/dL (ref 32.0–36.0)
MCV: 103.7 fL — AB (ref 79.3–98.0)
MONO#: 0.5 10*3/uL (ref 0.1–0.9)
MONO%: 6.5 % (ref 0.0–14.0)
NEUT#: 4.5 10*3/uL (ref 1.5–6.5)
NEUT%: 62.7 % (ref 39.0–75.0)
PLATELETS: 161 10*3/uL (ref 140–400)
RBC: 3.67 10*6/uL — ABNORMAL LOW (ref 4.20–5.82)
RDW: 15.2 % — ABNORMAL HIGH (ref 11.0–14.6)
WBC: 7.1 10*3/uL (ref 4.0–10.3)
lymph#: 1.9 10*3/uL (ref 0.9–3.3)

## 2013-05-01 NOTE — Progress Notes (Signed)
OFFICE PROGRESS NOTE  Interval history:  [name] returns for scheduled followup of gastrointestinal stromal tumor. He continues [place]. He has periodic loose stools. No nausea or vomiting. He denies leg swelling. He feels he does have some periorbital edema. No shortness of breath. No skin rash. He notes hair loss. This began prior to beginning [place].   Objective: Filed Vitals:   05/01/13 1027  BP: 124/62  Pulse: 52  Temp: 97.9 F (36.6 C)  Resp: 18   Mild periorbital edema. Lungs clear. Irregular cardiac rhythm. Abdomen soft and nontender. No organomegaly. No mass. Trace bilateral pretibial edema. No skin rash.   Lab Results: Lab Results  Component Value Date   WBC 7.1 05/01/2013   HGB 12.6* 05/01/2013   HCT 38.0* 05/01/2013   MCV 103.7* 05/01/2013   PLT 161 05/01/2013   NEUTROABS 4.5 05/01/2013    Chemistry:    Chemistry      Component Value Date/Time   NA 146* 05/01/2013 1015   NA 138 05/13/2012 1857   K 3.6 05/01/2013 1015   K 4.1 05/13/2012 1857   CL 110* 08/09/2012 0922   CL 104 05/13/2012 1857   CO2 25 05/01/2013 1015   CO2 20 05/13/2012 1857   BUN 13.1 05/01/2013 1015   BUN 18 05/13/2012 1857   CREATININE 1.5* 05/01/2013 1015   CREATININE 1.32 05/13/2012 1857      Component Value Date/Time   CALCIUM 8.7 05/01/2013 1015   CALCIUM 9.0 05/13/2012 1857   ALKPHOS 71 05/01/2013 1015   ALKPHOS 94 05/13/2012 1857   AST 17 05/01/2013 1015   AST 19 05/13/2012 1857   ALT 10 05/01/2013 1015   ALT 7 05/13/2012 1857   BILITOT 0.46 05/01/2013 1015   BILITOT 0.3 05/13/2012 1857       Studies/Results: No results found.  Medications: I have reviewed the patient's current medications.  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  Initiation of adjuvant Gleevec on 07/26/2012.  2. Diabetes.  3. History of coronary artery disease.   4. Chronic renal failure.  5. Admission with gastrointestinal bleeding and severe anemia 04/05/2012.  6. Hypotension/failure to thrive when he was here on 05/17/2012. His performance status continues to be improved.  7. 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  8. Admission with symptomatic hypoglycemia in [facility] week of 08/01/2012.  9. Diarrhea-likely secondary to [place]. He takes Imodium as needed. 10. Hair loss. Predated start of [place].  Dispositon-he appears stable. He will continue Gleevec. He will return for labs and a followup visit in 8 weeks. He will contact the office in the interim with any problems.  Plan reviewed with Dr. Benay Spice.   Ned Card ANP/GNP-BC

## 2013-05-01 NOTE — Telephone Encounter (Signed)
, °

## 2013-05-26 ENCOUNTER — Other Ambulatory Visit: Payer: Self-pay | Admitting: *Deleted

## 2013-05-26 MED ORDER — LOPERAMIDE HCL 2 MG PO CAPS: ORAL_CAPSULE | ORAL | Status: DC

## 2013-05-26 NOTE — Telephone Encounter (Signed)
Pt called reporting he has had 2 loose BM last night and one this am and requesting re-fill of Imodium to "help stop this early"  Imodium re-fill order and instructed pt to call office if symptoms do not improve and to drink plenty of fluids as well.  Pt verbalized understanding.

## 2013-06-02 ENCOUNTER — Other Ambulatory Visit: Payer: Self-pay | Admitting: Oncology

## 2013-06-02 DIAGNOSIS — D499 Neoplasm of unspecified behavior of unspecified site: Secondary | ICD-10-CM

## 2013-06-13 ENCOUNTER — Telehealth: Payer: Self-pay | Admitting: Oncology

## 2013-06-13 NOTE — Telephone Encounter (Signed)
Talked to pt ansd gave him appt for lab and ML 5/12

## 2013-06-27 ENCOUNTER — Telehealth: Payer: Self-pay | Admitting: Oncology

## 2013-06-27 ENCOUNTER — Other Ambulatory Visit (HOSPITAL_BASED_OUTPATIENT_CLINIC_OR_DEPARTMENT_OTHER): Payer: Medicare Other

## 2013-06-27 ENCOUNTER — Ambulatory Visit (HOSPITAL_BASED_OUTPATIENT_CLINIC_OR_DEPARTMENT_OTHER): Payer: Medicare Other | Admitting: Nurse Practitioner

## 2013-06-27 VITALS — BP 110/47 | HR 63 | Temp 98.4°F | Resp 20 | Ht 65.0 in | Wt 179.4 lb

## 2013-06-27 DIAGNOSIS — N189 Chronic kidney disease, unspecified: Secondary | ICD-10-CM

## 2013-06-27 DIAGNOSIS — C494 Malignant neoplasm of connective and soft tissue of abdomen: Secondary | ICD-10-CM

## 2013-06-27 DIAGNOSIS — IMO0002 Reserved for concepts with insufficient information to code with codable children: Secondary | ICD-10-CM

## 2013-06-27 DIAGNOSIS — E119 Type 2 diabetes mellitus without complications: Secondary | ICD-10-CM

## 2013-06-27 DIAGNOSIS — R634 Abnormal weight loss: Secondary | ICD-10-CM

## 2013-06-27 LAB — COMPREHENSIVE METABOLIC PANEL (CC13)
ALBUMIN: 3.3 g/dL — AB (ref 3.5–5.0)
ALT: 8 U/L (ref 0–55)
AST: 16 U/L (ref 5–34)
Alkaline Phosphatase: 57 U/L (ref 40–150)
Anion Gap: 12 mEq/L — ABNORMAL HIGH (ref 3–11)
BILIRUBIN TOTAL: 0.36 mg/dL (ref 0.20–1.20)
BUN: 14.9 mg/dL (ref 7.0–26.0)
CO2: 23 meq/L (ref 22–29)
Calcium: 8.5 mg/dL (ref 8.4–10.4)
Chloride: 111 mEq/L — ABNORMAL HIGH (ref 98–109)
Creatinine: 1.9 mg/dL — ABNORMAL HIGH (ref 0.7–1.3)
GLUCOSE: 163 mg/dL — AB (ref 70–140)
Potassium: 4 mEq/L (ref 3.5–5.1)
SODIUM: 146 meq/L — AB (ref 136–145)
TOTAL PROTEIN: 5.9 g/dL — AB (ref 6.4–8.3)

## 2013-06-27 LAB — CBC WITH DIFFERENTIAL/PLATELET
BASO%: 0.3 % (ref 0.0–2.0)
Basophils Absolute: 0 10*3/uL (ref 0.0–0.1)
EOS ABS: 0.2 10*3/uL (ref 0.0–0.5)
EOS%: 2.4 % (ref 0.0–7.0)
HEMATOCRIT: 36.2 % — AB (ref 38.4–49.9)
HEMOGLOBIN: 12.1 g/dL — AB (ref 13.0–17.1)
LYMPH%: 22 % (ref 14.0–49.0)
MCH: 34.6 pg — ABNORMAL HIGH (ref 27.2–33.4)
MCHC: 33.3 g/dL (ref 32.0–36.0)
MCV: 103.7 fL — ABNORMAL HIGH (ref 79.3–98.0)
MONO#: 0.4 10*3/uL (ref 0.1–0.9)
MONO%: 5.8 % (ref 0.0–14.0)
NEUT%: 69.5 % (ref 39.0–75.0)
NEUTROS ABS: 4.4 10*3/uL (ref 1.5–6.5)
PLATELETS: 165 10*3/uL (ref 140–400)
RBC: 3.49 10*6/uL — AB (ref 4.20–5.82)
RDW: 13.9 % (ref 11.0–14.6)
WBC: 6.3 10*3/uL (ref 4.0–10.3)
lymph#: 1.4 10*3/uL (ref 0.9–3.3)

## 2013-06-27 NOTE — Progress Notes (Signed)
  [facility] OFFICE PROGRESS NOTE   Diagnosis:  Gastrointestinal stromal tumor.  INTERVAL HISTORY:   [name] returns as scheduled. He continues [place]. He has periodic loose stools. He takes Imodium as needed. No nausea or vomiting. No periorbital or leg edema. No shortness of breath. No skin rash. He reports a good appetite though notes he is eating smaller portions.  Objective:  Vital signs in last 24 hours:  Blood pressure 110/47, pulse 63, temperature 98.4 F (36.9 C), temperature source Oral, resp. rate 20, height 5\' 5"  (1.651 m), weight 179 lb 6.4 oz (81.375 kg), SpO2 99.00%. 05/01/2013 weight 190 pounds; 04/07/2013 weight 183 pounds.  HEENT: No periorbital edema. Left subconjunctival hemorrhage. No thrush or ulcerations. Resp: Lungs clear. No wheezes or rales. Cardio: Irregular. GI: Soft and nontender. No organomegaly. No mass. Vascular: Trace bilateral pretibial edema. Skin: No rash.    Lab Results:  Lab Results  Component Value Date   WBC 6.3 06/27/2013   HGB 12.1* 06/27/2013   HCT 36.2* 06/27/2013   MCV 103.7* 06/27/2013   PLT 165 06/27/2013   NEUTROABS 4.4 06/27/2013    Imaging:  No results found.  Medications: I have reviewed the patient's current medications.  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  Initiation of adjuvant Gleevec on 07/26/2012.  2. Diabetes.  3. History of coronary artery disease.  4. Chronic renal failure.  5. Admission with gastrointestinal bleeding and severe anemia 04/05/2012.  6. Hypotension/failure to thrive when he was here on 05/17/2012. His performance status continues to be improved.  7. 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  8. Admission with symptomatic hypoglycemia  in [facility] week of 08/01/2012.  9. Diarrhea-likely secondary to [place]. He takes Imodium as needed.  10. Hair loss. Predated start of [place]. 11. Weight loss.   Disposition: [name] appears stable. He will continue Gleevec. We will have him return in 4 weeks for an office visit to followup the weight loss. He will contact the office in the interim with any problems.  Plan reviewed with Dr. Benay [name].    Owens Shark ANP/GNP-BC   06/27/2013  12:55 PM

## 2013-06-27 NOTE — Telephone Encounter (Signed)
gv adn printed appt sched and avs for pt fro June °

## 2013-07-24 ENCOUNTER — Other Ambulatory Visit: Payer: Self-pay | Admitting: Oncology

## 2013-07-24 DIAGNOSIS — D499 Neoplasm of unspecified behavior of unspecified site: Secondary | ICD-10-CM

## 2013-07-25 ENCOUNTER — Ambulatory Visit (HOSPITAL_BASED_OUTPATIENT_CLINIC_OR_DEPARTMENT_OTHER): Payer: Medicare Other | Admitting: Nurse Practitioner

## 2013-07-25 ENCOUNTER — Other Ambulatory Visit (HOSPITAL_BASED_OUTPATIENT_CLINIC_OR_DEPARTMENT_OTHER): Payer: Medicare Other

## 2013-07-25 ENCOUNTER — Telehealth: Payer: Self-pay | Admitting: Oncology

## 2013-07-25 VITALS — BP 122/73 | HR 53 | Temp 98.0°F | Resp 18 | Ht 65.0 in | Wt 176.7 lb

## 2013-07-25 DIAGNOSIS — C494 Malignant neoplasm of connective and soft tissue of abdomen: Secondary | ICD-10-CM

## 2013-07-25 DIAGNOSIS — IMO0002 Reserved for concepts with insufficient information to code with codable children: Secondary | ICD-10-CM

## 2013-07-25 DIAGNOSIS — E119 Type 2 diabetes mellitus without complications: Secondary | ICD-10-CM

## 2013-07-25 DIAGNOSIS — N189 Chronic kidney disease, unspecified: Secondary | ICD-10-CM

## 2013-07-25 DIAGNOSIS — I25810 Atherosclerosis of coronary artery bypass graft(s) without angina pectoris: Secondary | ICD-10-CM

## 2013-07-25 LAB — COMPREHENSIVE METABOLIC PANEL (CC13)
ALT: 8 U/L (ref 0–55)
ANION GAP: 7 meq/L (ref 3–11)
AST: 18 U/L (ref 5–34)
Albumin: 3.1 g/dL — ABNORMAL LOW (ref 3.5–5.0)
Alkaline Phosphatase: 56 U/L (ref 40–150)
BUN: 10.9 mg/dL (ref 7.0–26.0)
CALCIUM: 8.2 mg/dL — AB (ref 8.4–10.4)
CHLORIDE: 112 meq/L — AB (ref 98–109)
CO2: 26 meq/L (ref 22–29)
Creatinine: 1.5 mg/dL — ABNORMAL HIGH (ref 0.7–1.3)
Glucose: 114 mg/dl (ref 70–140)
Potassium: 3.7 mEq/L (ref 3.5–5.1)
SODIUM: 145 meq/L (ref 136–145)
TOTAL PROTEIN: 5.8 g/dL — AB (ref 6.4–8.3)
Total Bilirubin: 0.51 mg/dL (ref 0.20–1.20)

## 2013-07-25 LAB — CBC WITH DIFFERENTIAL/PLATELET
BASO%: 0.3 % (ref 0.0–2.0)
Basophils Absolute: 0 10*3/uL (ref 0.0–0.1)
EOS%: 3.2 % (ref 0.0–7.0)
Eosinophils Absolute: 0.2 10*3/uL (ref 0.0–0.5)
HEMATOCRIT: 37.2 % — AB (ref 38.4–49.9)
HGB: 12.7 g/dL — ABNORMAL LOW (ref 13.0–17.1)
LYMPH#: 1.6 10*3/uL (ref 0.9–3.3)
LYMPH%: 24 % (ref 14.0–49.0)
MCH: 34 pg — ABNORMAL HIGH (ref 27.2–33.4)
MCHC: 34.1 g/dL (ref 32.0–36.0)
MCV: 99.7 fL — ABNORMAL HIGH (ref 79.3–98.0)
MONO#: 0.4 10*3/uL (ref 0.1–0.9)
MONO%: 5.1 % (ref 0.0–14.0)
NEUT#: 4.6 10*3/uL (ref 1.5–6.5)
NEUT%: 67.4 % (ref 39.0–75.0)
Platelets: 167 10*3/uL (ref 140–400)
RBC: 3.73 10*6/uL — AB (ref 4.20–5.82)
RDW: 13.8 % (ref 11.0–14.6)
WBC: 6.8 10*3/uL (ref 4.0–10.3)

## 2013-07-25 NOTE — Progress Notes (Signed)
  [facility] OFFICE PROGRESS NOTE   Diagnosis:  Gastrointestinal stromal tumor.  INTERVAL HISTORY:   Mr. Paradiso returns as scheduled. He continues [place]. He denies nausea/vomiting. He has occasional loose stools. He does not have loose stools on a daily basis. No skin rash. He has stable dyspnea on exertion. He denies leg swelling. He reports a good appetite.  Objective:  Vital signs in last 24 hours:  Blood pressure 122/73, pulse 53, temperature 98 F (36.7 C), temperature source Oral, resp. rate 18, height 5\' 5"  (1.651 m), weight 176 lb 11.2 oz (80.151 kg), SpO2 100.00%.    HEENT: No thrush or ulcerations. Resp: Lungs clear. Cardio: Irregular. GI: Soft and nontender. No organomegaly. No mass. Vascular: Trace bilateral pretibial edema.  Skin: No rash.    Lab Results:  Lab Results  Component Value Date   WBC 6.8 07/25/2013   HGB 12.7* 07/25/2013   HCT 37.2* 07/25/2013   MCV 99.7* 07/25/2013   PLT 167 07/25/2013   NEUTROABS 4.6 07/25/2013    Imaging:  No results found.  Medications: I have reviewed the patient's current medications.  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  Initiation of adjuvant Gleevec on 07/26/2012.  2. Diabetes.  3. History of coronary artery disease.  4. Chronic renal failure.  5. Admission with gastrointestinal bleeding and severe anemia 04/05/2012.  6. Hypotension/failure to thrive when he was here on 05/17/2012. His performance status continues to be improved.  7. 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  8. Admission with symptomatic hypoglycemia in [facility] week of 08/01/2012.  9. Diarrhea-likely secondary to [place]. He takes Imodium as needed.  10. Hair loss. Predated start of  [place].  11. Weight loss. He has a good appetite. Weight is down slightly compared to 4 weeks ago.    Disposition: Mr. Saldivar appears stable. He will continue Gleevec. We scheduled a 6 week followup visit. We will continue to monitor for weight loss.    Owens Shark ANP/GNP-BC   07/25/2013  12:43 PM

## 2013-07-25 NOTE — Telephone Encounter (Signed)
Gave pt appt for lab and Md for july, ok to use chemo spot per ML

## 2013-08-02 ENCOUNTER — Other Ambulatory Visit: Payer: Self-pay | Admitting: Oncology

## 2013-08-02 DIAGNOSIS — D499 Neoplasm of unspecified behavior of unspecified site: Secondary | ICD-10-CM

## 2013-08-14 ENCOUNTER — Ambulatory Visit: Payer: Medicare Other | Admitting: Cardiology

## 2013-08-14 ENCOUNTER — Telehealth: Payer: Self-pay | Admitting: Cardiology

## 2013-08-16 NOTE — Telephone Encounter (Signed)
Closed encounter °

## 2013-08-23 ENCOUNTER — Ambulatory Visit (INDEPENDENT_AMBULATORY_CARE_PROVIDER_SITE_OTHER): Payer: Medicare Other | Admitting: Cardiology

## 2013-08-23 ENCOUNTER — Encounter: Payer: Self-pay | Admitting: Cardiology

## 2013-08-23 VITALS — BP 124/66 | HR 73 | Ht 66.0 in | Wt 171.5 lb

## 2013-08-23 DIAGNOSIS — I4949 Other premature depolarization: Secondary | ICD-10-CM

## 2013-08-23 DIAGNOSIS — I4891 Unspecified atrial fibrillation: Secondary | ICD-10-CM

## 2013-08-23 DIAGNOSIS — I493 Ventricular premature depolarization: Secondary | ICD-10-CM

## 2013-08-23 DIAGNOSIS — I480 Paroxysmal atrial fibrillation: Secondary | ICD-10-CM

## 2013-08-23 DIAGNOSIS — I25810 Atherosclerosis of coronary artery bypass graft(s) without angina pectoris: Secondary | ICD-10-CM

## 2013-08-23 DIAGNOSIS — E098 Drug or chemical induced diabetes mellitus with unspecified complications: Secondary | ICD-10-CM

## 2013-08-23 DIAGNOSIS — E785 Hyperlipidemia, unspecified: Secondary | ICD-10-CM

## 2013-08-23 DIAGNOSIS — I2510 Atherosclerotic heart disease of native coronary artery without angina pectoris: Secondary | ICD-10-CM

## 2013-08-23 DIAGNOSIS — Z9861 Coronary angioplasty status: Secondary | ICD-10-CM

## 2013-08-23 DIAGNOSIS — I10 Essential (primary) hypertension: Secondary | ICD-10-CM

## 2013-08-23 DIAGNOSIS — E139 Other specified diabetes mellitus without complications: Secondary | ICD-10-CM

## 2013-08-23 DIAGNOSIS — Z951 Presence of aortocoronary bypass graft: Secondary | ICD-10-CM

## 2013-08-23 NOTE — Patient Instructions (Signed)
Please check your medication against the list we have given .   have lab sent to [place] from primary  Your physician wants you to follow-up in 12 months Dr Ellyn Hack. You will receive a reminder letter in the mail two months in advance. If you don't receive a letter, please call our office to schedule the follow-up appointment.

## 2013-08-25 ENCOUNTER — Encounter: Payer: Self-pay | Admitting: Cardiology

## 2013-08-25 DIAGNOSIS — Z951 Presence of aortocoronary bypass graft: Secondary | ICD-10-CM | POA: Insufficient documentation

## 2013-08-25 DIAGNOSIS — I493 Ventricular premature depolarization: Secondary | ICD-10-CM | POA: Insufficient documentation

## 2013-08-25 NOTE — Assessment & Plan Note (Signed)
Currently listed that he is on both oral medication and insulin. As stated, we are not sure what he is taking. Defer control to PCP.

## 2013-08-25 NOTE — Assessment & Plan Note (Signed)
By report he is on beta blocker. He is not overly symptomatic with these and there chronic. Nonischemic by stress test.

## 2013-08-25 NOTE — Progress Notes (Signed)
PCP: Benito Mccreedy, MD  Clinic Note: Chief Complaint  Patient presents with  . 6 month visit    no chest pain , no sob, no edema-- patient does not no his  medication by name   HPI: [name] is a 73 y.o. male with a PMH below who presents today for general followup.  He saw Kerin Ransom in follow-up in December 2014. The plan was for him to have a Myoview stress test to evaluate for ischemia once he recovered from his GIST tumor surgery followed by failure to thrive, depression and anemia. He was due for a followup stress test for his CAD with CABG PTCA of the vein grafts. He had a stress test in January that was negative for ischemia or infarction.  Interval History: Since January, he seems to have been doing quite well overall, especially from I. from a cardiac standpoint.  No heart failure symptoms of PND, orthopnea. He denies any chest tightness or pressure /dyspnea at rest or with exertion.  He is a little fatigued, and has mild edema from [place]. The remainder of Cardiovascular ROS is as follows: no chest pain or dyspnea on exertion positive for - irregular heartbeat and palpitations - that are chronic for him negative for - chest pain, dyspnea on exertion, loss of consciousness, murmur, orthopnea, paroxysmal nocturnal dyspnea, rapid heart rate or shortness of breath: Additional cardiac review of systems: Lightheadedness - only when his blood sugars are down; dizziness - occasional, rare;, syncope/near-syncope - no; TIA/amaurosis fugax - no.  Melena - no, hematochezia no; hematuria - no; nosebleeds - no; claudication - no  Past Medical History  Diagnosis Date  . Hypertension   . Diabetes mellitus type 2 with complications     CAD, CVA, ED; currently on insulin  . Stroke 73 years old  . High cholesterol   . History of atrial fibrillation     No recurrence in over 5 years.  Marland Kitchen CAD in native artery 1997    PTCA D1, D2  . S/P CABG x 5 2002    LIMA-LAD, SVG-D1-OM2, SVG-D2,  SVG-rPDA  . Abnormal findings on cardiac catheterization March 2004    Tandem mid and distal RCA stenosis --> PCI below; proximal D1 lesion--> PCI below;   Marland Kitchen CAD S/P percutaneous coronary angioplasty 04/2002    PCI mRCA, dRCA; 3.5 mm x12 mm express BMS overlapping 3.0 mm x 8 mm Zeta BMS proximally; PCI-D1 2.75 mm 4 mm Taxus DES;   . CAD (coronary artery disease) of bypass graft March 2004    Remaining cath findings: SVG-RCA 100% occluded, SVG-D1-OM2 occluded; patent LIMA-LAD and SVG-OM1  . Depression   . Erectile dysfunction   . 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 Memory loss   . Arthritis   . GERD (gastroesophageal reflux disease)     Prior Cardiac Evaluation and Past Surgical History: Procedure Laterality Date  . Coronary artery bypass graft  04/21/2000    LIMA-LAD,SVG-D1 sequential to OM2 , SVG-D2 ,SVG-RPDA  . Cardiac catheterization  1997    PCI -D1 and D2  . Cardiac catheterization  05/02/2002    SEE angioplasty  . Cardiac catheterization  04/08/2000    LV MILDLY ENLARGED,EF 50%;MITRAL AND AORTIC VALVES  APPEAR NORMAL  . Coronary angioplasty  05/02/2002    mid RCA ,distal RCA and first diagonal branch. RCA 3.50 x 12 mm Express bare-metal stent,Zeta stent 3.o x8 in the more proximal portion;D1  stented with 2.75 x 24 mm Taxus DES stent; showed total occulsion of the RCA vein graft as well as the vein graft to the D1 and OM ,remaining grafts wer normally to OM and LAD  . Transthoracic echocardiogram  June 2012    Normal LV size and function. EF 55%. Mild LA dilation.  Marland Kitchen Nm myoview ltd  January 2015    Low risk stress nuclear study with mild diaphragmatic attenuation.  Gated images were not able to be obtained due to frequent PVCs.    Allergies  Allergen Reactions  . Morphine And Related Itching    Current Outpatient Prescriptions  Medication Sig Dispense Refill  . amLODipine (NORVASC) 5 MG tablet Take 10 mg by  mouth daily.       Marland Kitchen aspirin 325 MG tablet Take 325 mg by mouth daily.      Marland Kitchen atorvastatin (LIPITOR) 40 MG tablet Take 40 mg by mouth daily.      . Biotin 7500 MCG TABS Take 7,500 mcg by mouth.      . Cholecalciferol (VITAMIN D-3) 5000 UNITS TABS Take 1 tablet by mouth daily.      . clopidogrel (PLAVIX) 75 MG tablet Take 75 mg by mouth daily.      . DULoxetine (CYMBALTA) 60 MG capsule Take 60 mg by mouth daily.       . ferrous sulfate 325 (65 FE) MG EC tablet Take 325 mg by mouth daily.      . folic acid (FOLVITE) 1 MG tablet       . furosemide (LASIX) 20 MG tablet Take 20 mg by mouth daily.       Marland Kitchen GLEEVEC 400 MG tablet TAKE 1 TABLET BY MOUTH ONCE DAILY  30 tablet  1  . glimepiride (AMARYL) 4 MG tablet Take 4 mg by mouth daily.       . insulin NPH-regular (NOVOLIN 70/30) (70-30) 100 UNIT/ML injection Inject into the skin.      Marland Kitchen loperamide (IMODIUM) 2 MG capsule TAKE 2 CAPSULES AFTER FIRST LOOSE STOOL, THEN 1 AFTER EVERY LOOSE STOOL AFTER THAT. MAX 8 TABS PER DAY  30 capsule  2  . metoprolol tartrate (LOPRESSOR) 25 MG tablet TAKE 1/2 TABLET TWICE DAILY.  90 tablet  3  . Multiple Vitamin (MULTI-VITAMINS) TABS Take 1 tablet by mouth daily.       Marland Kitchen NITROSTAT 0.4 MG SL tablet Place 0.4 mg under the tongue as needed.      . pantoprazole (PROTONIX) 40 MG tablet Take 40 mg by mouth 2 (two) times daily.       . potassium chloride SA (K-DUR,KLOR-CON) 20 MEQ tablet TAKE 1 TABLET TWICE DAILY  180 tablet  0  . QUEtiapine (SEROQUEL) 200 MG tablet Take 1 tablet (200 mg total) by mouth daily at 8 pm.      . RAPAFLO 8 MG CAPS capsule 8 mg daily.      . traZODone (DESYREL) 100 MG tablet       . triamcinolone cream (KENALOG) 0.1 %       . vitamin B-12 (CYANOCOBALAMIN) 1000 MCG tablet        No current facility-administered medications for this visit.   although these medications are listed, he cannot tell you what he is taking by name. He has his pills in a box 4 morning and afternoon and takes an update  them based on what he has.  History   Social History Narrative   Divorced. Patient one child daughter.  Lives alone-holds furniture and uses walker for ambulation.    Patient is retired. High school education.   Friend comes to home several times day to check on him and fix/bring meals and check glucose      No regular exercise.  Does not drink or smoke.    ROS: A comprehensive Review of Systems - Negative except Symptoms noted in history of present illness. His fatigue is improved. He no longer has significant problems with depression. Occasional, rare bilateral hand numbness  PHYSICAL EXAM BP 124/66  Pulse 73  Ht 5\' 6"  (1.676 m)  Wt 171 lb 8 oz (77.792 kg)  BMI 27.69 kg/m2 General appearance: alert, cooperative, appears stated age, no distress and mildly obese Neck: no adenopathy, no carotid bruit, no JVD and supple, symmetrical, trachea midline Lungs: clear to auscultation bilaterally, normal percussion bilaterally and Nonlabored, clear movement Heart: regular rate and rhythm, S1, S2 normal, no murmur, click, rub or gallop and normal apical impulse Abdomen: soft, non-tender; bowel sounds normal; no masses,  no organomegaly Extremities: edema Trace to 1+ and no ulcers, gangrene or trophic changes Pulses: 2+ and symmetric Neurologic: Grossly normal; overall improved mood and affect but remains relatively flat  NTZ:GYFVCBSWH today: Yes Rate: 73 , Rhythm: NSR, frequent PVCs, RBBB, possible anterior MI, age indeterminate.; No change  Recent Labs: They were checked by his primary, but he does not know the results.  Lipids were not checked.  ASSESSMENT / PLAN: CAD S/P CABG X 5 '02. RCA DES '04 No elective anginal symptoms. He does indeed have an abnormal EKG but no signs of ischemia. Negative Myoview in January. As best he can recall he is taking aspirin plus Plavix. He can take either 81 or 162 mg of aspirin for treatment.  He has on his listed medications that he is taking a beta  blocker but not an ACE inhibitor. Since I am not sure what medications he is on, I cannot make any changes for now. He is due to see his primary physician soon. I have asked that he go through the medication list we have compared to his medications at all please let us know what he is actually taking.  Unless there is a contraindication, in a patient with coronary disease and diabetes, and ACE inhibitor would be recommended if not an ARB. However currently his blood pressure does not want any significant adjustments to his medications.  Paroxysmal atrial fibrillation Again, I have still unable to see a recurrence since his operation. For now I think we will not consider this as a diagnosis unless proven. Therefore, we will not start him on full anticoagulation  Frequent unifocal PVCs By report he is on beta blocker. He is not overly symptomatic with these and there chronic. Nonischemic by stress test.  Essential hypertension Well-controlled. If his pressures were to increase would consider ACE inhibitor. He is on calcium channel blocker by report which could be converted to an ACE inhibitor or ARB.  Dyslipidemia, goal LDL below 70 Reportedly he is taking atorvastatin. He is due for labs checked by his PCP.  Diabetes- Type 2 IDDM Currently listed that he is on both oral medication and insulin. As stated, we are not sure what he is taking. Defer control to PCP.    Orders Placed This Encounter  Procedures  . EKG 12-Lead    Followup: 12 months  DAVID W. Ellyn Hack, [name], M.S. [facility] [address] [place], [place]  [address]  [phone] Pager # [phone]

## 2013-08-25 NOTE — Assessment & Plan Note (Signed)
Reportedly he is taking atorvastatin. He is due for labs checked by his PCP.

## 2013-08-25 NOTE — Assessment & Plan Note (Addendum)
No elective anginal symptoms. He does indeed have an abnormal EKG but no signs of ischemia. Negative Myoview in January. As best he can recall he is taking aspirin plus Plavix. He can take either 81 or 162 mg of aspirin for treatment.  He has on his listed medications that he is taking a beta blocker but not an ACE inhibitor. Since I am not sure what medications he is on, I cannot make any changes for now. He is due to see his primary physician soon. I have asked that he go through the medication list we have compared to his medications at all please let us know what he is actually taking.  Unless there is a contraindication, in a patient with coronary disease and diabetes, and ACE inhibitor would be recommended if not an ARB. However currently his blood pressure does not want any significant adjustments to his medications.

## 2013-08-25 NOTE — Assessment & Plan Note (Signed)
Well-controlled. If his pressures were to increase would consider ACE inhibitor. He is on calcium channel blocker by report which could be converted to an ACE inhibitor or ARB.

## 2013-08-25 NOTE — Assessment & Plan Note (Signed)
Again, I have still unable to see a recurrence since his operation. For now I think we will not consider this as a diagnosis unless proven. Therefore, we will not start him on full anticoagulation

## 2013-09-01 ENCOUNTER — Other Ambulatory Visit: Payer: Self-pay | Admitting: *Deleted

## 2013-09-01 ENCOUNTER — Telehealth: Payer: Self-pay | Admitting: *Deleted

## 2013-09-01 DIAGNOSIS — IMO0002 Reserved for concepts with insufficient information to code with codable children: Secondary | ICD-10-CM

## 2013-09-01 NOTE — Telephone Encounter (Signed)
Called to report continued weight loss-down to 170 lb now from 176 in June. Appetite is diminished-"my taste is off". No nausea or vomiting. Denies diarrhea. Continues Gleevec 400 mg daily. Confirmed he has appointment with Dr. Benay Spice on 7/21 at 0915-will discuss his weight/nutrition at that time. Will make referral for dietician to see him while he is here.

## 2013-09-03 ENCOUNTER — Telehealth: Payer: Self-pay | Admitting: Oncology

## 2013-09-03 NOTE — Telephone Encounter (Signed)
lvm for pt regarding to adding nut appt on 7.21

## 2013-09-04 ENCOUNTER — Other Ambulatory Visit: Payer: Self-pay | Admitting: Oncology

## 2013-09-04 DIAGNOSIS — D499 Neoplasm of unspecified behavior of unspecified site: Secondary | ICD-10-CM

## 2013-09-04 DIAGNOSIS — E876 Hypokalemia: Secondary | ICD-10-CM

## 2013-09-05 ENCOUNTER — Ambulatory Visit: Payer: Medicare Other | Admitting: Nutrition

## 2013-09-05 ENCOUNTER — Ambulatory Visit (HOSPITAL_BASED_OUTPATIENT_CLINIC_OR_DEPARTMENT_OTHER): Payer: Medicare Other | Admitting: Oncology

## 2013-09-05 ENCOUNTER — Other Ambulatory Visit (HOSPITAL_BASED_OUTPATIENT_CLINIC_OR_DEPARTMENT_OTHER): Payer: Medicare Other

## 2013-09-05 ENCOUNTER — Telehealth: Payer: Self-pay | Admitting: Oncology

## 2013-09-05 ENCOUNTER — Other Ambulatory Visit: Payer: Self-pay | Admitting: *Deleted

## 2013-09-05 VITALS — BP 109/46 | HR 63 | Temp 98.3°F | Resp 18 | Ht 66.0 in | Wt 172.4 lb

## 2013-09-05 DIAGNOSIS — E876 Hypokalemia: Secondary | ICD-10-CM

## 2013-09-05 DIAGNOSIS — IMO0002 Reserved for concepts with insufficient information to code with codable children: Secondary | ICD-10-CM

## 2013-09-05 DIAGNOSIS — E119 Type 2 diabetes mellitus without complications: Secondary | ICD-10-CM

## 2013-09-05 DIAGNOSIS — N189 Chronic kidney disease, unspecified: Secondary | ICD-10-CM

## 2013-09-05 DIAGNOSIS — C166 Malignant neoplasm of greater curvature of stomach, unspecified: Secondary | ICD-10-CM

## 2013-09-05 DIAGNOSIS — R634 Abnormal weight loss: Secondary | ICD-10-CM

## 2013-09-05 DIAGNOSIS — D499 Neoplasm of unspecified behavior of unspecified site: Secondary | ICD-10-CM

## 2013-09-05 LAB — COMPREHENSIVE METABOLIC PANEL (CC13)
ALK PHOS: 51 U/L (ref 40–150)
ALT: 10 U/L (ref 0–55)
AST: 22 U/L (ref 5–34)
Albumin: 3 g/dL — ABNORMAL LOW (ref 3.5–5.0)
Anion Gap: 8 mEq/L (ref 3–11)
BUN: 14.3 mg/dL (ref 7.0–26.0)
CALCIUM: 8.1 mg/dL — AB (ref 8.4–10.4)
CHLORIDE: 112 meq/L — AB (ref 98–109)
CO2: 26 mEq/L (ref 22–29)
CREATININE: 1.3 mg/dL (ref 0.7–1.3)
Glucose: 55 mg/dl — ABNORMAL LOW (ref 70–140)
Potassium: 3.2 mEq/L — ABNORMAL LOW (ref 3.5–5.1)
Sodium: 146 mEq/L — ABNORMAL HIGH (ref 136–145)
Total Bilirubin: 0.49 mg/dL (ref 0.20–1.20)
Total Protein: 5.6 g/dL — ABNORMAL LOW (ref 6.4–8.3)

## 2013-09-05 LAB — CBC WITH DIFFERENTIAL/PLATELET
BASO%: 0.6 % (ref 0.0–2.0)
Basophils Absolute: 0 10*3/uL (ref 0.0–0.1)
EOS%: 2.8 % (ref 0.0–7.0)
Eosinophils Absolute: 0.2 10*3/uL (ref 0.0–0.5)
HCT: 36.5 % — ABNORMAL LOW (ref 38.4–49.9)
HGB: 12.3 g/dL — ABNORMAL LOW (ref 13.0–17.1)
LYMPH#: 2.3 10*3/uL (ref 0.9–3.3)
LYMPH%: 28.1 % (ref 14.0–49.0)
MCH: 34.5 pg — AB (ref 27.2–33.4)
MCHC: 33.6 g/dL (ref 32.0–36.0)
MCV: 102.5 fL — ABNORMAL HIGH (ref 79.3–98.0)
MONO#: 0.5 10*3/uL (ref 0.1–0.9)
MONO%: 6 % (ref 0.0–14.0)
NEUT#: 5.2 10*3/uL (ref 1.5–6.5)
NEUT%: 62.5 % (ref 39.0–75.0)
Platelets: 185 10*3/uL (ref 140–400)
RBC: 3.56 10*6/uL — ABNORMAL LOW (ref 4.20–5.82)
RDW: 15.1 % — AB (ref 11.0–14.6)
WBC: 8.3 10*3/uL (ref 4.0–10.3)

## 2013-09-05 MED ORDER — POTASSIUM CHLORIDE CRYS ER 20 MEQ PO TBCR: 20.0000 meq | EXTENDED_RELEASE_TABLET | Freq: Every day | ORAL | Status: DC

## 2013-09-05 NOTE — Telephone Encounter (Signed)
gv and printed appt schedand avs for pt for Sept °

## 2013-09-05 NOTE — Progress Notes (Signed)
  [facility] OFFICE PROGRESS NOTE   Diagnosis: Gastrointestinal stromal tumor  INTERVAL HISTORY:   [name] returns as scheduled. He continues [place]. He has loose stool in the evening. This is relieved with Imodium. He was recently taken off of insulin. He reports no recent hypoglycemic episode. He ran out of potassium.  Objective:  Vital signs in last 24 hours:  Blood pressure 109/46, pulse 63, temperature 98.3 F (36.8 C), temperature source Oral, resp. rate 18, height 5\' 6"  (1.676 m), weight 172 lb 6.4 oz (78.2 kg), SpO2 99.00%.    HEENT: No buccal thrush or ulcers, mild whitecoat over the tongue Resp: Lungs clear bilaterally Cardio: Irregular GI: Nontender, no hepatosplenomegaly, no mass Vascular: Trace low pretibial edema bilaterally  Skin: No rash    Lab Results:  Lab Results  Component Value Date   WBC 8.3 09/05/2013   HGB 12.3* 09/05/2013   HCT 36.5* 09/05/2013   MCV 102.5* 09/05/2013   PLT 185 09/05/2013   NEUTROABS 5.2 09/05/2013   potassium 3.2, BUN 14.3, creatinine 1.3  Imaging:  No results found.  Medications: I have reviewed the patient's current medications.  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  Initiation of adjuvant Gleevec on 07/26/2012.  2. Diabetes.  3. History of coronary artery disease.  4. Chronic renal failure.  5. Admission with gastrointestinal bleeding and severe anemia 04/05/2012.  6. Hypotension/failure to thrive when he was here on 05/17/2012. His performance status continues to be improved.  7. 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  8. Admission with symptomatic hypoglycemia in [facility] week of 08/01/2012.  9. Diarrhea-likely secondary to [place]. He takes  Imodium as needed.  10. Hair loss. Predated start of [place].  11. Weight loss. He report his appetite has diminished    Disposition:  [name] appears to be tolerating the [place] well. He remains in clinical remission from the gastrointestinal stromal tumor. He will meet with the cancer Center nutritionist today. We will continue monitoring the weight loss and consider a restaging CT if this continues.  Betsy Coder, MD  09/05/2013  10:15 AM

## 2013-09-05 NOTE — Progress Notes (Signed)
73 year old male diagnosed with GIST.  Patient is status post partial gastrectomy.  He is a patient of Dr. Benay Spice.  Past medical history includes diabetes, CAD, CRF, hypertension, stroke, hypercholesterolemia, atrial fibrillation, CABG x5, depression, GERD.  Medications include, Gleevec, Lipitor, vitamin D3.  Labs include creatinine 1.5, and albumin 3.1 on June 9.  Height: 66 inches. Weight: 172.4 pounds. Usual body weight: 190 pounds in February 2015. BMI: 27.84.  Patient has decreased appetite and tolerate small amounts of food at a time.  His lost approximately 18 pounds over the past 5 months.  Patient reports diarrhea is controlled with Imodium.  He denies other nutrition side effects.  Reports physician discontinued insulin.  Nutrition diagnosis: Unintended weight loss related to diagnosis of GIST as evidenced by 9% weight loss in 5 months.  Intervention: Patient educated to consume smaller, more frequent meals following postgastrectomy diet.  Encouraged patient to add oral nutrition supplement once daily between meals.  Recommended patient strive for weight maintenance.  Provided oral nutrition supplement samples, fact sheets, and my contact information for questions.  Teach back method used.  Monitoring, evaluation, goals: Patient will tolerate increased calories and protein to promote weight maintenance.  Next visit: Patient to call me for further questions or concerns.   **Disclaimer: This note was dictated with voice recognition software. Similar sounding words can inadvertently be transcribed and this note may contain transcription errors which may not have been corrected upon publication of note.**

## 2013-09-06 ENCOUNTER — Telehealth: Payer: Self-pay | Admitting: *Deleted

## 2013-09-06 NOTE — Telephone Encounter (Signed)
Copy of CBC/CMET routed to PCP per MD order.

## 2013-09-06 NOTE — Telephone Encounter (Signed)
Message copied by Domenic Schwab on Wed Sep 06, 2013  4:56 PM ------      Message from: Ladell Pier      Created: Tue Sep 05, 2013  5:14 PM       Copy to primary MD ------

## 2013-10-05 ENCOUNTER — Emergency Department (HOSPITAL_COMMUNITY)
Admission: EM | Admit: 2013-10-05 | Discharge: 2013-10-06 | Disposition: A | Discharge status: Home / Self Care | Payer: Medicare Other | Attending: Emergency Medicine | Admitting: Emergency Medicine

## 2013-10-05 ENCOUNTER — Emergency Department (HOSPITAL_COMMUNITY): Payer: Medicare Other

## 2013-10-05 ENCOUNTER — Encounter (HOSPITAL_COMMUNITY): Payer: Self-pay | Admitting: Emergency Medicine

## 2013-10-05 DIAGNOSIS — M129 Arthropathy, unspecified: Secondary | ICD-10-CM | POA: Insufficient documentation

## 2013-10-05 DIAGNOSIS — Z79899 Other long term (current) drug therapy: Secondary | ICD-10-CM | POA: Insufficient documentation

## 2013-10-05 DIAGNOSIS — I2510 Atherosclerotic heart disease of native coronary artery without angina pectoris: Secondary | ICD-10-CM | POA: Diagnosis not present

## 2013-10-05 DIAGNOSIS — Z7901 Long term (current) use of anticoagulants: Secondary | ICD-10-CM | POA: Insufficient documentation

## 2013-10-05 DIAGNOSIS — Z951 Presence of aortocoronary bypass graft: Secondary | ICD-10-CM | POA: Diagnosis not present

## 2013-10-05 DIAGNOSIS — R5383 Other fatigue: Secondary | ICD-10-CM

## 2013-10-05 DIAGNOSIS — Z8673 Personal history of transient ischemic attack (TIA), and cerebral infarction without residual deficits: Secondary | ICD-10-CM | POA: Insufficient documentation

## 2013-10-05 DIAGNOSIS — E119 Type 2 diabetes mellitus without complications: Secondary | ICD-10-CM | POA: Insufficient documentation

## 2013-10-05 DIAGNOSIS — F3289 Other specified depressive episodes: Secondary | ICD-10-CM | POA: Diagnosis not present

## 2013-10-05 DIAGNOSIS — K219 Gastro-esophageal reflux disease without esophagitis: Secondary | ICD-10-CM | POA: Diagnosis not present

## 2013-10-05 DIAGNOSIS — I10 Essential (primary) hypertension: Secondary | ICD-10-CM | POA: Diagnosis not present

## 2013-10-05 DIAGNOSIS — Z7982 Long term (current) use of aspirin: Secondary | ICD-10-CM | POA: Insufficient documentation

## 2013-10-05 DIAGNOSIS — Z87448 Personal history of other diseases of urinary system: Secondary | ICD-10-CM | POA: Diagnosis not present

## 2013-10-05 DIAGNOSIS — I4891 Unspecified atrial fibrillation: Secondary | ICD-10-CM | POA: Insufficient documentation

## 2013-10-05 DIAGNOSIS — Z8509 Personal history of malignant neoplasm of other digestive organs: Secondary | ICD-10-CM | POA: Diagnosis not present

## 2013-10-05 DIAGNOSIS — R5381 Other malaise: Secondary | ICD-10-CM | POA: Insufficient documentation

## 2013-10-05 DIAGNOSIS — Z794 Long term (current) use of insulin: Secondary | ICD-10-CM | POA: Diagnosis not present

## 2013-10-05 DIAGNOSIS — Z9889 Other specified postprocedural states: Secondary | ICD-10-CM | POA: Insufficient documentation

## 2013-10-05 DIAGNOSIS — F329 Major depressive disorder, single episode, unspecified: Secondary | ICD-10-CM | POA: Diagnosis not present

## 2013-10-05 DIAGNOSIS — E7800 Pure hypercholesterolemia, unspecified: Secondary | ICD-10-CM | POA: Diagnosis not present

## 2013-10-05 LAB — COMPREHENSIVE METABOLIC PANEL
ALBUMIN: 3.2 g/dL — AB (ref 3.5–5.2)
ALT: 10 U/L (ref 0–53)
AST: 25 U/L (ref 0–37)
Alkaline Phosphatase: 56 U/L (ref 39–117)
Anion gap: 10 (ref 5–15)
BILIRUBIN TOTAL: 0.3 mg/dL (ref 0.3–1.2)
BUN: 16 mg/dL (ref 6–23)
CO2: 25 mEq/L (ref 19–32)
CREATININE: 1.47 mg/dL — AB (ref 0.50–1.35)
Calcium: 8.5 mg/dL (ref 8.4–10.5)
Chloride: 110 mEq/L (ref 96–112)
GFR calc Af Amer: 54 mL/min — ABNORMAL LOW (ref >90)
GFR calc non Af Amer: 46 mL/min — ABNORMAL LOW (ref >90)
Glucose, Bld: 142 mg/dL — ABNORMAL HIGH (ref 70–99)
Potassium: 3.5 mEq/L — ABNORMAL LOW (ref 3.7–5.3)
Sodium: 145 mEq/L (ref 137–147)
Total Protein: 5.9 g/dL — ABNORMAL LOW (ref 6.0–8.3)

## 2013-10-05 LAB — CBC WITH DIFFERENTIAL/PLATELET
BASOS ABS: 0 10*3/uL (ref 0.0–0.1)
BASOS PCT: 0 % (ref 0–1)
Eosinophils Absolute: 0.1 10*3/uL (ref 0.0–0.7)
Eosinophils Relative: 2 % (ref 0–5)
HCT: 30.3 % — ABNORMAL LOW (ref 39.0–52.0)
Hemoglobin: 10.7 g/dL — ABNORMAL LOW (ref 13.0–17.0)
Lymphocytes Relative: 28 % (ref 12–46)
Lymphs Abs: 2 10*3/uL (ref 0.7–4.0)
MCH: 35 pg — ABNORMAL HIGH (ref 26.0–34.0)
MCHC: 35.3 g/dL (ref 30.0–36.0)
MCV: 99 fL (ref 78.0–100.0)
MONO ABS: 0.5 10*3/uL (ref 0.1–1.0)
Monocytes Relative: 7 % (ref 3–12)
NEUTROS ABS: 4.4 10*3/uL (ref 1.7–7.7)
Neutrophils Relative %: 63 % (ref 43–77)
PLATELETS: 154 10*3/uL (ref 150–400)
RBC: 3.06 MIL/uL — ABNORMAL LOW (ref 4.22–5.81)
RDW: 13.8 % (ref 11.5–15.5)
WBC: 7.1 10*3/uL (ref 4.0–10.5)

## 2013-10-05 LAB — I-STAT TROPONIN, ED: TROPONIN I, POC: 0.02 ng/mL (ref 0.00–0.08)

## 2013-10-05 LAB — CBG MONITORING, ED
Glucose-Capillary: 121 mg/dL — ABNORMAL HIGH (ref 70–99)
Glucose-Capillary: 138 mg/dL — ABNORMAL HIGH (ref 70–99)

## 2013-10-05 MED ORDER — SODIUM CHLORIDE 0.9 % IV SOLN
INTRAVENOUS | Status: DC
  Administered 2013-10-06: 125 mL/h via INTRAVENOUS

## 2013-10-05 MED ORDER — SODIUM CHLORIDE 0.9 % IV BOLUS (SEPSIS)
500.0000 mL | Freq: Once | INTRAVENOUS | Status: AC
  Administered 2013-10-06: 500 mL via INTRAVENOUS

## 2013-10-05 NOTE — ED Notes (Addendum)
Pt reports waking this evening and feeling shaky and weak.  Pt reports he felt normal before going to sleep.  Pt appears to be weak and tired at present, answering questions approprietly, states "I just feel weak".  Neuro exam negative in triage.

## 2013-10-06 DIAGNOSIS — R5381 Other malaise: Secondary | ICD-10-CM | POA: Diagnosis not present

## 2013-10-06 LAB — URINALYSIS, ROUTINE W REFLEX MICROSCOPIC
Bilirubin Urine: NEGATIVE
Glucose, UA: NEGATIVE mg/dL
Hgb urine dipstick: NEGATIVE
Ketones, ur: NEGATIVE mg/dL
Leukocytes, UA: NEGATIVE
Nitrite: NEGATIVE
Protein, ur: NEGATIVE mg/dL
SPECIFIC GRAVITY, URINE: 1.016 (ref 1.005–1.030)
UROBILINOGEN UA: 0.2 mg/dL (ref 0.0–1.0)
pH: 5 (ref 5.0–8.0)

## 2013-10-06 MED ORDER — SODIUM CHLORIDE 0.9 % IV BOLUS (SEPSIS)
500.0000 mL | Freq: Once | INTRAVENOUS | Status: AC
  Administered 2013-10-06: 500 mL via INTRAVENOUS

## 2013-10-06 NOTE — Discharge Instructions (Signed)
Get plenty of rest, and drinking a lot of fluids.    Fatigue Fatigue is a feeling of tiredness, lack of energy, lack of motivation, or feeling tired all the time. Having enough rest, good nutrition, and reducing stress will normally reduce fatigue. Consult your caregiver if it persists. The nature of your fatigue will help your caregiver to find out its cause. The treatment is based on the cause.  CAUSES  There are many causes for fatigue. Most of the time, fatigue can be traced to one or more of your habits or routines. Most causes fit into one or more of three general areas. They are: Lifestyle problems  Sleep disturbances.  Overwork.  Physical exertion.  Unhealthy habits.  Poor eating habits or eating disorders.  Alcohol and/or drug use .  Lack of proper nutrition (malnutrition). Psychological problems  Stress and/or anxiety problems.  Depression.  Grief.  Boredom. Medical Problems or Conditions  Anemia.  Pregnancy.  Thyroid gland problems.  Recovery from major surgery.  Continuous pain.  Emphysema or asthma that is not well controlled  Allergic conditions.  Diabetes.  Infections (such as mononucleosis).  Obesity.  Sleep disorders, such as sleep apnea.  Heart failure or other heart-related problems.  Cancer.  Kidney disease.  Liver disease.  Effects of certain medicines such as antihistamines, cough and cold remedies, prescription pain medicines, heart and blood pressure medicines, drugs used for treatment of cancer, and some antidepressants. SYMPTOMS  The symptoms of fatigue include:   Lack of energy.  Lack of drive (motivation).  Drowsiness.  Feeling of indifference to the surroundings. DIAGNOSIS  The details of how you feel help guide your caregiver in finding out what is causing the fatigue. You will be asked about your present and past health condition. It is important to review all medicines that you take, including prescription and  non-prescription items. A thorough exam will be done. You will be questioned about your feelings, habits, and normal lifestyle. Your caregiver may suggest blood tests, urine tests, or other tests to look for common medical causes of fatigue.  TREATMENT  Fatigue is treated by correcting the underlying cause. For example, if you have continuous pain or depression, treating these causes will improve how you feel. Similarly, adjusting the dose of certain medicines will help in reducing fatigue.  HOME CARE INSTRUCTIONS   Try to get the required amount of good sleep every night.  Eat a healthy and nutritious diet, and drink enough water throughout the day.  Practice ways of relaxing (including yoga or meditation).  Exercise regularly.  Make plans to change situations that cause stress. Act on those plans so that stresses decrease over time. Keep your work and personal routine reasonable.  Avoid street drugs and minimize use of alcohol.  Start taking a daily multivitamin after consulting your caregiver. SEEK MEDICAL CARE IF:   You have persistent tiredness, which cannot be accounted for.  You have fever.  You have unintentional weight loss.  You have headaches.  You have disturbed sleep throughout the night.  You are feeling sad.  You have constipation.  You have dry skin.  You have gained weight.  You are taking any new or different medicines that you suspect are causing fatigue.  You are unable to sleep at night.  You develop any unusual swelling of your legs or other parts of your body. SEEK IMMEDIATE MEDICAL CARE IF:   You are feeling confused.  Your vision is blurred.  You feel faint or pass  out.  You develop severe headache.  You develop severe abdominal, pelvic, or back pain.  You develop chest pain, shortness of breath, or an irregular or fast heartbeat.  You are unable to pass a normal amount of urine.  You develop abnormal bleeding such as bleeding from  the rectum or you vomit blood.  You have thoughts about harming yourself or committing suicide.  You are worried that you might harm someone else. MAKE SURE YOU:   Understand these instructions.  Will watch your condition.  Will get help right away if you are not doing well or get worse. Document Released: 11/30/2006 Document Revised: 04/27/2011 Document Reviewed: 06/06/2013 [employer] Patient Information 2015 [place], [place]. This information is not intended to replace advice given to you by your health care provider. Make sure you discuss any questions you have with your health care provider.

## 2013-10-06 NOTE — ED Notes (Signed)
Pt walked on the hallway no distress noticed, HR between 45 -50, O2 saturation 99% on RA.

## 2013-10-06 NOTE — ED Notes (Signed)
Pt unable to walk at this time, pt states he feels extremely weak no feeling enough strength to walk around.

## 2013-10-07 LAB — URINE CULTURE
COLONY COUNT: NO GROWTH
Culture: NO GROWTH

## 2013-10-09 NOTE — ED Provider Notes (Signed)
CSN: [id number]     Arrival date & time 10/05/13  2222 History   First MD Initiated Contact with Patient 10/05/13 2304     Chief Complaint  Patient presents with  . Weakness     (Consider location/radiation/quality/duration/timing/severity/associated sxs/prior Treatment) Patient is a 73 y.o. male presenting with weakness. The history is provided by the patient.  Weakness   Patient presents for evaluation of weakness. He, states the problem began this morning. He denies nausea, vomiting, cough, shortness of breath, chest pain, difficulty eating fever or chills, paresthesias. He reports he is taking his usual medications, without relief. There are no other known modifying factors.  Past Medical History  Diagnosis Date  . Hypertension   . Diabetes mellitus type 2 with complications     CAD, CVA, ED; currently on insulin  . Stroke 73 years old  . High cholesterol   . History of atrial fibrillation     No recurrence in over 5 years.  Marland Kitchen CAD in native artery 1997    PTCA D1, D2  . S/P CABG x 5 2002    LIMA-LAD, SVG-D1-OM2, SVG-D2, SVG-rPDA  . Abnormal findings on cardiac catheterization March 2004    Tandem mid and distal RCA stenosis --> PCI below; proximal D1 lesion--> PCI below;   Marland Kitchen CAD S/P percutaneous coronary angioplasty 04/2002    PCI mRCA, dRCA; 3.5 mm x12 mm express BMS overlapping 3.0 mm x 8 mm Zeta BMS proximally; PCI-D1 2.75 mm 4 mm Taxus DES;   . CAD (coronary artery disease) of bypass graft March 2004    Remaining cath findings: SVG-RCA 100% occluded, SVG-D1-OM2 occluded; patent LIMA-LAD and SVG-OM1  . Depression   . Erectile dysfunction   . 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 Memory loss   . Arthritis   . GERD (gastroesophageal reflux disease)    Past Surgical History  Procedure Laterality Date  . Eye surgery      cataracts bilateral  . Carpal tunnel release      bilateral  . Tonsillectomy    .  Penile prosthesis implant  03/15/2012    Procedure: PENILE PROTHESIS INFLATABLE;  Surgeon: Hanley Ben, MD;  Location: [facility];  Service: Urology;  Laterality: N/A;  . Circumcision  03/15/2012    Procedure: CIRCUMCISION ADULT;  Surgeon: Hanley Ben, MD;  Location: [facility];  Service: Urology;  Laterality: N/A;  . Esophagogastroduodenoscopy N/A 04/06/2012    Procedure: ESOPHAGOGASTRODUODENOSCOPY (EGD);  Surgeon: Wonda Horner, MD;  Location: [facility] ENDOSCOPY;  Service: Endoscopy;  Laterality: N/A;  . Colonoscopy N/A 04/14/2012    Procedure: COLONOSCOPY;  Surgeon: Missy Sabins, MD;  Location: [place];  Service: Endoscopy;  Laterality: N/A;  . Gastric resection N/A 04/15/2012    Procedure: Partial Gastrectomy;  Surgeon: Gwenyth Ober, MD;  Location: [place];  Service: General;  Laterality: N/A;  . Coronary artery bypass graft  04/21/2000    LIMA-LAD,SVG-D1 sequential to OM2 , SVG-D2 ,SVG-RPDA  . Cardiac catheterization  1997    PCI -D1 and D2  . Cardiac catheterization  05/02/2002    SEE angioplasty  . Cardiac catheterization  04/08/2000    LV MILDLY ENLARGED,EF 50%;MITRAL AND AORTIC VALVES  APPEAR NORMAL  . Coronary angioplasty  05/02/2002    mid RCA ,distal RCA and first diagonal branch. RCA 3.50 x 12 mm Express bare-metal stent,Zeta stent 3.o x8 in the more proximal portion;D1 stented with 2.75 x 24 mm Taxus  DES stent; showed total occulsion of the RCA vein graft as well as the vein graft to the D1 and OM ,remaining grafts wer normally to OM and LAD  . Transthoracic echocardiogram  June 2012    Normal LV size and function. EF 55%. Mild LA dilation.  Marland Kitchen Nm myoview ltd  January 2015    Low risk stress nuclear study with mild diaphragmatic attenuation.  Gated images were not able to be obtained due to frequent PVCs.   Family History  Problem Relation Age of Onset  . Heart attack Mother   . Heart attack Father    History  Substance Use Topics  . Smoking status: Never Smoker   . Smokeless  tobacco: Never Used  . Alcohol Use: No    Review of Systems  Neurological: Positive for weakness.  All other systems reviewed and are negative.     Allergies  Morphine and related  Home Medications   Prior to Admission medications   Medication Sig Start Date End Date Taking? Authorizing Provider  amLODipine (NORVASC) 5 MG tablet Take 10 mg by mouth daily.     Historical Provider, MD  aspirin 325 MG tablet Take 325 mg by mouth daily.    Historical Provider, MD  atorvastatin (LIPITOR) 40 MG tablet Take 40 mg by mouth daily.    Historical Provider, MD  Biotin 7500 MCG TABS Take 7,500 mcg by mouth.    Historical Provider, MD  Cholecalciferol (VITAMIN D-3) 5000 UNITS TABS Take 1 tablet by mouth daily.    Historical Provider, MD  clopidogrel (PLAVIX) 75 MG tablet Take 75 mg by mouth daily.    Historical Provider, MD  DULoxetine (CYMBALTA) 60 MG capsule Take 60 mg by mouth daily.  08/11/13   Historical Provider, MD  ferrous sulfate 325 (65 FE) MG EC tablet Take 325 mg by mouth daily. 12/27/12   Historical Provider, MD  folic acid (FOLVITE) 1 MG tablet  06/06/13   Historical Provider, MD  furosemide (LASIX) 20 MG tablet Take 20 mg by mouth daily.  09/27/12   Historical Provider, MD  GLEEVEC 400 MG tablet TAKE 1 TABLET BY MOUTH ONCE DAILY    Ladell Pier, MD  glimepiride (AMARYL) 4 MG tablet Take 4 mg by mouth daily.  06/01/12   Historical Provider, MD  insulin NPH-regular (NOVOLIN 70/30) (70-30) 100 UNIT/ML injection Inject into the skin.    Historical Provider, MD  loperamide (IMODIUM) 2 MG capsule TAKE 2 CAPSULES AFTER FIRST LOOSE STOOL, THEN 1 AFTER EVERY LOOSE STOOL AFTER THAT. MAX 8 TABS PER DAY 07/24/13   Ladell Pier, MD  metoprolol tartrate (LOPRESSOR) 25 MG tablet TAKE 1/2 TABLET TWICE DAILY. 02/27/13   Pixie Casino, MD  Multiple Vitamin (MULTI-VITAMINS) TABS Take 1 tablet by mouth daily.  05/19/12   Historical Provider, MD  NITROSTAT 0.4 MG SL tablet Place 0.4 mg under the tongue  as needed. 03/20/12   Historical Provider, MD  pantoprazole (PROTONIX) 40 MG tablet Take 40 mg by mouth 2 (two) times daily.  05/10/12   Historical Provider, MD  potassium chloride SA (K-DUR,KLOR-CON) 20 MEQ tablet Take 1 tablet (20 mEq total) by mouth daily. 09/05/13   Ladell Pier, MD  QUEtiapine (SEROQUEL) 200 MG tablet Take 1 tablet (200 mg total) by mouth daily at 8 pm. 04/23/12   Bynum Bellows, MD  RAPAFLO 8 MG CAPS capsule 8 mg daily. 06/02/12   Historical Provider, MD  traZODone (DESYREL) 100 MG tablet  04/28/13  Historical Provider, MD  triamcinolone cream (KENALOG) 0.1 %  03/30/13   Historical Provider, MD  vitamin B-12 (CYANOCOBALAMIN) 1000 MCG tablet  06/06/13   Historical Provider, MD   BP 115/47  Pulse 43  Temp(Src) 98.5 F (36.9 C) (Oral)  Resp 19  Ht 5\' 5"  (1.651 m)  Wt 172 lb (78.019 kg)  BMI 28.62 kg/m2  SpO2 100% Physical Exam  Nursing note and vitals reviewed. Constitutional: He is oriented to person, place, and time. He appears well-developed and well-nourished. No distress.  HENT:  Head: Normocephalic and atraumatic.  Right Ear: External ear normal.  Left Ear: External ear normal.  Eyes: Conjunctivae and EOM are normal. Pupils are equal, round, and reactive to light.  Neck: Normal range of motion and phonation normal. Neck supple.  Cardiovascular: Normal rate, regular rhythm, normal heart sounds and intact distal pulses.   Pulmonary/Chest: Effort normal and breath sounds normal. He exhibits no bony tenderness.  Abdominal: Soft. There is no tenderness.  Musculoskeletal: Normal range of motion.  Neurological: He is alert and oriented to person, place, and time. No cranial nerve deficit or sensory deficit. He exhibits normal muscle tone. Coordination normal.  Skin: Skin is warm, dry and intact.  Psychiatric: He has a normal mood and affect. His behavior is normal. Judgment and thought content normal.    ED Course  Procedures (including critical care  time)  Medications  sodium chloride 0.9 % bolus 500 mL (0 mLs Intravenous Stopped 10/06/13 0053)  sodium chloride 0.9 % bolus 500 mL (0 mLs Intravenous Stopped 10/06/13 0423)    No data found.   At discharge- Reevaluation with update and discussion. After initial assessment and treatment, an updated evaluation reveals the patient feels back to normal. He is tolerating oral liquids, and fluid. He is able to circumnavigate the emergency Department pod. He has no further complaints and feels ready to go home. Findings discussed with patient, questions answered.Daleen Bo L    Labs Review Labs Reviewed  CBC WITH DIFFERENTIAL - Abnormal; Notable for the following:    RBC 3.06 (*)    Hemoglobin 10.7 (*)    HCT 30.3 (*)    MCH 35.0 (*)    All other components within normal limits  COMPREHENSIVE METABOLIC PANEL - Abnormal; Notable for the following:    Potassium 3.5 (*)    Glucose, Bld 142 (*)    Creatinine, Ser 1.47 (*)    Total Protein 5.9 (*)    Albumin 3.2 (*)    GFR calc non Af Amer 46 (*)    GFR calc Af Amer 54 (*)    All other components within normal limits  CBG MONITORING, ED - Abnormal; Notable for the following:    Glucose-Capillary 138 (*)    All other components within normal limits  CBG MONITORING, ED - Abnormal; Notable for the following:    Glucose-Capillary 121 (*)    All other components within normal limits  URINE CULTURE  URINALYSIS, ROUTINE W REFLEX MICROSCOPIC  I-STAT TROPOININ, ED    Imaging Review No results found.   EKG Interpretation   Date/Time:  Thursday October 05 2013 22:37:40 EDT Ventricular Rate:  82 PR Interval:  144 QRS Duration: 136 QT Interval:  422 QTC Calculation: 493 R Axis:   31 Text Interpretation:  Unusual P axis, possible ectopic atrial rhythm with  occasional Premature ventricular complexes and Premature atrial complexes  Right bundle branch block Abnormal ECG ED PHYSICIAN INTERPRETATION  AVAILABLE IN [facility]  Confirmed  by TEST, Record ([id number]) on  10/07/2013 11:08:50 AM      MDM   Final diagnoses:  Other fatigue    Fatigue with transient hypotension, resolved after treatment. ED evaluation is reassuring. Doubt serious bacterial infection. Metabolic instability, CVA, or ACS.  Nursing Notes Reviewed/ Care Coordinated Applicable Imaging Reviewed Interpretation of Laboratory Data incorporated into ED treatment  The patient appears reasonably screened and/or stabilized for discharge and I doubt any other medical condition or other [facility] requiring further screening, evaluation, or treatment in the ED at this time prior to discharge.  Plan: Home Medications- usual; Home Treatments- rest; return here if the recommended treatment, does not improve the symptoms; Recommended follow up- PCP check up in 3-4 days   Richarda Blade, MD 10/09/13 1558

## 2013-10-10 ENCOUNTER — Other Ambulatory Visit: Payer: Self-pay | Admitting: Oncology

## 2013-10-10 DIAGNOSIS — D499 Neoplasm of unspecified behavior of unspecified site: Secondary | ICD-10-CM

## 2013-10-17 ENCOUNTER — Other Ambulatory Visit (HOSPITAL_BASED_OUTPATIENT_CLINIC_OR_DEPARTMENT_OTHER): Payer: Medicare Other

## 2013-10-17 ENCOUNTER — Ambulatory Visit (HOSPITAL_BASED_OUTPATIENT_CLINIC_OR_DEPARTMENT_OTHER): Payer: Medicare Other | Admitting: Nurse Practitioner

## 2013-10-17 ENCOUNTER — Telehealth: Payer: Self-pay | Admitting: Oncology

## 2013-10-17 VITALS — BP 136/62 | HR 63 | Temp 97.9°F | Resp 18 | Ht 65.0 in | Wt 177.1 lb

## 2013-10-17 DIAGNOSIS — IMO0002 Reserved for concepts with insufficient information to code with codable children: Secondary | ICD-10-CM

## 2013-10-17 DIAGNOSIS — C166 Malignant neoplasm of greater curvature of stomach, unspecified: Secondary | ICD-10-CM

## 2013-10-17 DIAGNOSIS — R634 Abnormal weight loss: Secondary | ICD-10-CM

## 2013-10-17 LAB — CBC WITH DIFFERENTIAL/PLATELET
BASO%: 0.5 % (ref 0.0–2.0)
BASOS ABS: 0 10*3/uL (ref 0.0–0.1)
EOS%: 1.9 % (ref 0.0–7.0)
Eosinophils Absolute: 0.1 10*3/uL (ref 0.0–0.5)
HCT: 34.5 % — ABNORMAL LOW (ref 38.4–49.9)
HEMOGLOBIN: 11.3 g/dL — AB (ref 13.0–17.1)
LYMPH%: 15.6 % (ref 14.0–49.0)
MCH: 35 pg — AB (ref 27.2–33.4)
MCHC: 32.8 g/dL (ref 32.0–36.0)
MCV: 106.9 fL — AB (ref 79.3–98.0)
MONO#: 0.4 10*3/uL (ref 0.1–0.9)
MONO%: 5.8 % (ref 0.0–14.0)
NEUT%: 76.2 % — ABNORMAL HIGH (ref 39.0–75.0)
NEUTROS ABS: 5.3 10*3/uL (ref 1.5–6.5)
Platelets: 188 10*3/uL (ref 140–400)
RBC: 3.23 10*6/uL — ABNORMAL LOW (ref 4.20–5.82)
RDW: 14.9 % — ABNORMAL HIGH (ref 11.0–14.6)
WBC: 6.9 10*3/uL (ref 4.0–10.3)
lymph#: 1.1 10*3/uL (ref 0.9–3.3)

## 2013-10-17 LAB — COMPREHENSIVE METABOLIC PANEL (CC13)
ALBUMIN: 3 g/dL — AB (ref 3.5–5.0)
ALK PHOS: 59 U/L (ref 40–150)
ALT: 15 U/L (ref 0–55)
AST: 23 U/L (ref 5–34)
Anion Gap: 6 mEq/L (ref 3–11)
BILIRUBIN TOTAL: 0.29 mg/dL (ref 0.20–1.20)
BUN: 15.4 mg/dL (ref 7.0–26.0)
CO2: 27 mEq/L (ref 22–29)
CREATININE: 1.3 mg/dL (ref 0.7–1.3)
Calcium: 8.2 mg/dL — ABNORMAL LOW (ref 8.4–10.4)
Chloride: 111 mEq/L — ABNORMAL HIGH (ref 98–109)
Glucose: 123 mg/dl (ref 70–140)
POTASSIUM: 4.3 meq/L (ref 3.5–5.1)
Sodium: 144 mEq/L (ref 136–145)
Total Protein: 5.5 g/dL — ABNORMAL LOW (ref 6.4–8.3)

## 2013-10-17 NOTE — Progress Notes (Signed)
  [place] OFFICE PROGRESS NOTE   Diagnosis:  Gastrointestinal stromal tumor  INTERVAL HISTORY:   Mr. Sisneros returns as scheduled. He continues [place]. He denies nausea/vomiting. No mouth sores. No skin rash. He has intermittent loose stools. He notes a poor energy level. He denies shortness of breath. He denies leg swelling. He notes swelling around his eyes.  Objective:  Vital signs in last 24 hours:  Blood pressure 136/62, pulse 63, temperature 97.9 F (36.6 C), temperature source Oral, resp. rate 18, height 5\' 5"  (1.651 m), weight 177 lb 1.6 oz (80.332 kg), SpO2 98.00%.    HEENT: No thrush or ulcers. Mild periorbital edema. Resp: Lungs clear bilaterally. Cardio: Irregular. GI: Abdomen soft and nontender. No organomegaly. No mass. Vascular: Trace lower leg edema bilaterally.  Skin: No rash.    Lab Results:  Lab Results  Component Value Date   WBC 6.9 10/17/2013   HGB 11.3* 10/17/2013   HCT 34.5* 10/17/2013   MCV 106.9* 10/17/2013   PLT 188 10/17/2013   NEUTROABS 5.3 10/17/2013    Imaging:  No results found.  Medications: I have reviewed the patient's current medications.  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  Initiation of adjuvant Gleevec on 07/26/2012.  2. Diabetes.  3. History of coronary artery disease.  4. Chronic renal failure.  5. Admission with gastrointestinal bleeding and severe anemia 04/05/2012.  6. Hypotension/failure to thrive when he was here on 05/17/2012. His performance status continues to be improved.  7. 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  8. Admission with symptomatic hypoglycemia in [facility] week of 08/01/2012.  9. Diarrhea-likely secondary to [place]. He takes Imodium as  needed.  10. Hair loss. Predated start of [place].  11. Weight loss. Improved 10/17/2013.     Disposition: Mr. Mckamie remains in clinical remission from the gastrointestinal stromal tumor. He will continue Gleevec.  His weight is improved as compared to 6 weeks ago. We will continue to monitor.  He will return for a followup visit in 6 weeks. He will contact the office in the interim with any problems.  Ned Card ANP/GNP-BC   10/17/2013  1:02 PM

## 2013-10-17 NOTE — Telephone Encounter (Signed)
[facility] PT APPT SCHEDULE FOR OCT °

## 2013-11-07 ENCOUNTER — Other Ambulatory Visit: Payer: Self-pay | Admitting: Oncology

## 2013-11-11 ENCOUNTER — Emergency Department (HOSPITAL_COMMUNITY): Payer: Medicare Other

## 2013-11-11 ENCOUNTER — Emergency Department (HOSPITAL_COMMUNITY)
Admission: EM | Admit: 2013-11-11 | Discharge: 2013-11-11 | Disposition: A | Discharge status: Home / Self Care | Payer: Medicare Other | Attending: Emergency Medicine | Admitting: Emergency Medicine

## 2013-11-11 ENCOUNTER — Encounter (HOSPITAL_COMMUNITY): Payer: Self-pay | Admitting: Emergency Medicine

## 2013-11-11 DIAGNOSIS — K219 Gastro-esophageal reflux disease without esophagitis: Secondary | ICD-10-CM | POA: Diagnosis not present

## 2013-11-11 DIAGNOSIS — I2510 Atherosclerotic heart disease of native coronary artery without angina pectoris: Secondary | ICD-10-CM | POA: Insufficient documentation

## 2013-11-11 DIAGNOSIS — Z9861 Coronary angioplasty status: Secondary | ICD-10-CM | POA: Diagnosis not present

## 2013-11-11 DIAGNOSIS — I4891 Unspecified atrial fibrillation: Secondary | ICD-10-CM | POA: Diagnosis not present

## 2013-11-11 DIAGNOSIS — R002 Palpitations: Secondary | ICD-10-CM | POA: Diagnosis present

## 2013-11-11 DIAGNOSIS — E119 Type 2 diabetes mellitus without complications: Secondary | ICD-10-CM | POA: Insufficient documentation

## 2013-11-11 DIAGNOSIS — F3289 Other specified depressive episodes: Secondary | ICD-10-CM | POA: Diagnosis not present

## 2013-11-11 DIAGNOSIS — IMO0002 Reserved for concepts with insufficient information to code with codable children: Secondary | ICD-10-CM | POA: Insufficient documentation

## 2013-11-11 DIAGNOSIS — Z8673 Personal history of transient ischemic attack (TIA), and cerebral infarction without residual deficits: Secondary | ICD-10-CM | POA: Insufficient documentation

## 2013-11-11 DIAGNOSIS — E7800 Pure hypercholesterolemia, unspecified: Secondary | ICD-10-CM | POA: Diagnosis not present

## 2013-11-11 DIAGNOSIS — Z8509 Personal history of malignant neoplasm of other digestive organs: Secondary | ICD-10-CM | POA: Diagnosis not present

## 2013-11-11 DIAGNOSIS — M129 Arthropathy, unspecified: Secondary | ICD-10-CM | POA: Diagnosis not present

## 2013-11-11 DIAGNOSIS — Z79899 Other long term (current) drug therapy: Secondary | ICD-10-CM | POA: Diagnosis not present

## 2013-11-11 DIAGNOSIS — Z794 Long term (current) use of insulin: Secondary | ICD-10-CM | POA: Diagnosis not present

## 2013-11-11 DIAGNOSIS — I10 Essential (primary) hypertension: Secondary | ICD-10-CM | POA: Diagnosis not present

## 2013-11-11 DIAGNOSIS — F329 Major depressive disorder, single episode, unspecified: Secondary | ICD-10-CM | POA: Diagnosis not present

## 2013-11-11 DIAGNOSIS — Z951 Presence of aortocoronary bypass graft: Secondary | ICD-10-CM | POA: Insufficient documentation

## 2013-11-11 DIAGNOSIS — Z7902 Long term (current) use of antithrombotics/antiplatelets: Secondary | ICD-10-CM | POA: Insufficient documentation

## 2013-11-11 DIAGNOSIS — Z87448 Personal history of other diseases of urinary system: Secondary | ICD-10-CM | POA: Insufficient documentation

## 2013-11-11 DIAGNOSIS — Z9889 Other specified postprocedural states: Secondary | ICD-10-CM | POA: Diagnosis not present

## 2013-11-11 DIAGNOSIS — Z7982 Long term (current) use of aspirin: Secondary | ICD-10-CM | POA: Insufficient documentation

## 2013-11-11 LAB — BASIC METABOLIC PANEL
Anion gap: 10 (ref 5–15)
BUN: 13 mg/dL (ref 6–23)
CHLORIDE: 105 meq/L (ref 96–112)
CO2: 26 mEq/L (ref 19–32)
CREATININE: 1.15 mg/dL (ref 0.50–1.35)
Calcium: 8.4 mg/dL (ref 8.4–10.5)
GFR calc Af Amer: 72 mL/min — ABNORMAL LOW (ref >90)
GFR calc non Af Amer: 62 mL/min — ABNORMAL LOW (ref >90)
Glucose, Bld: 208 mg/dL — ABNORMAL HIGH (ref 70–99)
POTASSIUM: 4 meq/L (ref 3.7–5.3)
Sodium: 141 mEq/L (ref 137–147)

## 2013-11-11 LAB — CBC
HEMATOCRIT: 35.7 % — AB (ref 39.0–52.0)
Hemoglobin: 12.7 g/dL — ABNORMAL LOW (ref 13.0–17.0)
MCH: 35.9 pg — ABNORMAL HIGH (ref 26.0–34.0)
MCHC: 35.6 g/dL (ref 30.0–36.0)
MCV: 100.8 fL — AB (ref 78.0–100.0)
Platelets: 149 10*3/uL — ABNORMAL LOW (ref 150–400)
RBC: 3.54 MIL/uL — ABNORMAL LOW (ref 4.22–5.81)
RDW: 13.6 % (ref 11.5–15.5)
WBC: 6 10*3/uL (ref 4.0–10.5)

## 2013-11-11 LAB — I-STAT TROPONIN, ED: Troponin i, poc: 0.01 ng/mL (ref 0.00–0.08)

## 2013-11-11 LAB — PROTIME-INR
INR: 1.06 (ref 0.00–1.49)
PROTHROMBIN TIME: 13.8 s (ref 11.6–15.2)

## 2013-11-11 NOTE — ED Provider Notes (Signed)
CSN: [id number]     Arrival date & time 11/11/13  1317 History   First MD Initiated Contact with Patient 11/11/13 1337     Chief Complaint  Patient presents with  . Palpitations     (Consider location/radiation/quality/duration/timing/severity/associated sxs/prior Treatment) HPI Comments: Patient is a 73 year old male with extensive past medical history including coronary artery disease with CABG x5, diabetes, atrial fibrillation. He presents with complaints of palpitations that occur at night. He states that he can feel himself "stopp breathing", then his heart begins to race. This resolves after several minutes after it has wakened him from sleep. He denies any chest pain or palpitations during the day. He denies any fevers or chills. He denies any productive cough. He tells me he has had a sleep study in the past which was unremarkable. He was due to have another sleep study performed in [place], where he was unable to make this appointment 4 months ago do to transportation issues.  Patient is a 73 y.o. male presenting with palpitations. The history is provided by the patient.  Palpitations Palpitations quality:  Irregular Onset quality:  Sudden Timing:  Intermittent Progression:  Unchanged Chronicity:  Recurrent   Past Medical History  Diagnosis Date  . Hypertension   . Diabetes mellitus type 2 with complications     CAD, CVA, ED; currently on insulin  . Stroke 73 years old  . High cholesterol   . History of atrial fibrillation     No recurrence in over 5 years.  Marland Kitchen CAD in native artery 1997    PTCA D1, D2  . S/P CABG x 5 2002    LIMA-LAD, SVG-D1-OM2, SVG-D2, SVG-rPDA  . Abnormal findings on cardiac catheterization March 2004    Tandem mid and distal RCA stenosis --> PCI below; proximal D1 lesion--> PCI below;   Marland Kitchen CAD S/P percutaneous coronary angioplasty 04/2002    PCI mRCA, dRCA; 3.5 mm x12 mm express BMS overlapping 3.0 mm x 8 mm Zeta BMS proximally; PCI-D1 2.75 mm 4 mm  Taxus DES;   . CAD (coronary artery disease) of bypass graft March 2004    Remaining cath findings: SVG-RCA 100% occluded, SVG-D1-OM2 occluded; patent LIMA-LAD and SVG-OM1  . Depression   . Erectile dysfunction   . 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 Memory loss   . Arthritis   . GERD (gastroesophageal reflux disease)    Past Surgical History  Procedure Laterality Date  . Eye surgery      cataracts bilateral  . Carpal tunnel release      bilateral  . Tonsillectomy    . Penile prosthesis implant  03/15/2012    Procedure: PENILE PROTHESIS INFLATABLE;  Surgeon: Hanley Ben, MD;  Location: [facility];  Service: Urology;  Laterality: N/A;  . Circumcision  03/15/2012    Procedure: CIRCUMCISION ADULT;  Surgeon: Hanley Ben, MD;  Location: [facility];  Service: Urology;  Laterality: N/A;  . Esophagogastroduodenoscopy N/A 04/06/2012    Procedure: ESOPHAGOGASTRODUODENOSCOPY (EGD);  Surgeon: Wonda Horner, MD;  Location[facility];  Service: Endoscopy;  Laterality: N/A;  . Colonoscopy N/A 04/14/2012    Procedure: COLONOSCOPY;  Surgeon: Missy Sabins, MD;  Location: [place];  Service: Endoscopy;  Laterality: N/A;  . Gastric resection N/A 04/15/2012    Procedure: Partial Gastrectomy;  Surgeon: Gwenyth Ober, MD;  Location: [place];  Service: General;  Laterality: N/A;  . Coronary artery bypass graft  04/21/2000  LIMA-LAD,SVG-D1 sequential to OM2 , SVG-D2 ,SVG-RPDA  . Cardiac catheterization  1997    PCI -D1 and D2  . Cardiac catheterization  05/02/2002    SEE angioplasty  . Cardiac catheterization  04/08/2000    LV MILDLY ENLARGED,EF 50%;MITRAL AND AORTIC VALVES  APPEAR NORMAL  . Coronary angioplasty  05/02/2002    mid RCA ,distal RCA and first diagonal branch. RCA 3.50 x 12 mm Express bare-metal stent,Zeta stent 3.o x8 in the more proximal portion;D1 stented with 2.75 x 24 mm Taxus DES stent; showed total occulsion of the RCA  vein graft as well as the vein graft to the D1 and OM ,remaining grafts wer normally to OM and LAD  . Transthoracic echocardiogram  June 2012    Normal LV size and function. EF 55%. Mild LA dilation.  Marland Kitchen Nm myoview ltd  January 2015    Low risk stress nuclear study with mild diaphragmatic attenuation.  Gated images were not able to be obtained due to frequent PVCs.   Family History  Problem Relation Age of Onset  . Heart attack Mother   . Heart attack Father    History  Substance Use Topics  . Smoking status: Never Smoker   . Smokeless tobacco: Never Used  . Alcohol Use: No    Review of Systems  Cardiovascular: Positive for palpitations.  All other systems reviewed and are negative.     Allergies  Morphine and related  Home Medications   Prior to Admission medications   Medication Sig Start Date End Date Taking? Authorizing Provider  amLODipine (NORVASC) 5 MG tablet Take 10 mg by mouth daily.     Historical Provider, MD  aspirin 325 MG tablet Take 325 mg by mouth daily.    Historical Provider, MD  atorvastatin (LIPITOR) 40 MG tablet Take 40 mg by mouth daily.    Historical Provider, MD  Biotin 7500 MCG TABS Take 7,500 mcg by mouth.    Historical Provider, MD  Cholecalciferol (VITAMIN D-3) 5000 UNITS TABS Take 1 tablet by mouth daily.    Historical Provider, MD  clopidogrel (PLAVIX) 75 MG tablet Take 75 mg by mouth daily.    Historical Provider, MD  DULoxetine (CYMBALTA) 60 MG capsule Take 60 mg by mouth daily.  08/11/13   Historical Provider, MD  ferrous sulfate 325 (65 FE) MG EC tablet Take 325 mg by mouth daily. 12/27/12   Historical Provider, MD  folic acid (FOLVITE) 1 MG tablet  06/06/13   Historical Provider, MD  furosemide (LASIX) 20 MG tablet Take 20 mg by mouth daily.  09/27/12   Historical Provider, MD  GLEEVEC 400 MG tablet TAKE 1 TABLET BY MOUTH ONCE DAILY 11/07/13   Ladell Pier, MD  glimepiride (AMARYL) 4 MG tablet Take 4 mg by mouth daily.  06/01/12   Historical  Provider, MD  insulin NPH-regular (NOVOLIN 70/30) (70-30) 100 UNIT/ML injection Inject into the skin.    Historical Provider, MD  loperamide (IMODIUM) 2 MG capsule TAKE 2 CAPSULES AFTER FIRST LOOSE STOOL, THEN 1 AFTER EVERY LOOSE STOOL AFTER THAT. MAX 8 TABS PER DAY 07/24/13   Ladell Pier, MD  metoprolol tartrate (LOPRESSOR) 25 MG tablet TAKE 1/2 TABLET TWICE DAILY. 02/27/13   Pixie Casino, MD  Multiple Vitamin (MULTI-VITAMINS) TABS Take 1 tablet by mouth daily.  05/19/12   Historical Provider, MD  NITROSTAT 0.4 MG SL tablet Place 0.4 mg under the tongue as needed. 03/20/12   Historical Provider, MD  pantoprazole (PROTONIX) 40  MG tablet Take 40 mg by mouth 2 (two) times daily.  05/10/12   Historical Provider, MD  potassium chloride SA (K-DUR,KLOR-CON) 20 MEQ tablet Take 1 tablet (20 mEq total) by mouth daily. 09/05/13   Ladell Pier, MD  QUEtiapine (SEROQUEL) 200 MG tablet Take 1 tablet (200 mg total) by mouth daily at 8 pm. 04/23/12   Bynum Bellows, MD  RAPAFLO 8 MG CAPS capsule 8 mg daily. 06/02/12   Historical Provider, MD  traZODone (DESYREL) 100 MG tablet  04/28/13   Historical Provider, MD  triamcinolone cream (KENALOG) 0.1 %  03/30/13   Historical Provider, MD  vitamin B-12 (CYANOCOBALAMIN) 1000 MCG tablet  06/06/13   Historical Provider, MD   BP 148/72  Pulse 73  Temp(Src) 97.6 F (36.4 C) (Oral)  Resp 18  SpO2 99% Physical Exam  Nursing note and vitals reviewed. Constitutional: He is oriented to person, place, and time. He appears well-developed and well-nourished. No distress.  HENT:  Head: Normocephalic and atraumatic.  Mouth/Throat: Oropharynx is clear and moist.  Neck: Normal range of motion. Neck supple.  Cardiovascular: Normal rate, regular rhythm and normal heart sounds.   No murmur heard. Pulmonary/Chest: Effort normal and breath sounds normal. No respiratory distress. He has no wheezes.  Abdominal: Soft. Bowel sounds are normal. He exhibits no distension. There is no  tenderness.  Musculoskeletal: Normal range of motion. He exhibits no edema.  Neurological: He is alert and oriented to person, place, and time.  Skin: Skin is warm and dry. He is not diaphoretic.    ED Course  Procedures (including critical care time) Labs Review Labs Reviewed  [place], ED    Imaging Review No results found.   EKG Interpretation   Date/Time:  Saturday November 11 2013 13:23:08 EDT Ventricular Rate:  77 PR Interval:  152 QRS Duration: 138 QT Interval:  432 QTC Calculation: 488 R Axis:   -42 Text Interpretation:  Sinus rhythm with Premature atrial complexes Left  axis deviation Right bundle branch block Septal infarct , age undetermined  Abnormal ECG Confirmed by DELOS  MD, [place]-Carmen ([id number]) on 11/11/2013 1:42:56  PM      MDM   Final diagnoses:  None    Patient presents with concerns of palpitations that occur at night. Patient is concerned that he has sleep apnea. He has a sleep study in the past which did not show this and was due to have a second however did not materialize. Workup today reveals normal sinus rhythm and no significant electrolyte abnormalities. I see nothing acute that requires hospitalization. I feel as though he is appropriate for discharge with followup with his primary Dr. He is to discuss a sleep study versus event monitor. He understands to return if his symptoms worsen or change.    Veryl Speak, MD 11/11/13 [phone]

## 2013-11-11 NOTE — ED Notes (Signed)
Pt. Stated, Im have a fast heart rate at night ,  I think I stop breathing and then it will cause my heart rate to go fast to wake me up.  This has been going n for couple of months.

## 2013-11-11 NOTE — Discharge Instructions (Signed)
Follow up with your primary Dr. to discuss a sleep study.  Return to the emergency department in the meantime if your symptoms substantially worsen or change.   Palpitations A palpitation is the feeling that your heartbeat is irregular or is faster than normal. It may feel like your heart is fluttering or skipping a beat. Palpitations are usually not a serious problem. However, in some cases, you may need further medical evaluation. CAUSES  Palpitations can be caused by:  Smoking.  Caffeine or other stimulants, such as diet pills or energy drinks.  Alcohol.  Stress and anxiety.  Strenuous physical activity.  Fatigue.  Certain medicines.  Heart disease, especially if you have a history of irregular heart rhythms (arrhythmias), such as atrial fibrillation, atrial flutter, or supraventricular tachycardia.  An improperly working pacemaker or defibrillator. DIAGNOSIS  To find the cause of your palpitations, your health care provider will take your medical history and perform a physical exam. Your health care provider may also have you take a test called an ambulatory electrocardiogram (ECG). An ECG records your heartbeat patterns over a 24-hour period. You may also have other tests, such as:  Transthoracic echocardiogram (TTE). During echocardiography, sound waves are used to evaluate how blood flows through your heart.  Transesophageal echocardiogram (TEE).  Cardiac monitoring. This allows your health care provider to monitor your heart rate and rhythm in real time.  Holter monitor. This is a portable device that records your heartbeat and can help diagnose heart arrhythmias. It allows your health care provider to track your heart activity for several days, if needed.  Stress tests by exercise or by giving medicine that makes the heart beat faster. TREATMENT  Treatment of palpitations depends on the cause of your symptoms and can vary greatly. Most cases of palpitations do not  require any treatment other than time, relaxation, and monitoring your symptoms. Other causes, such as atrial fibrillation, atrial flutter, or supraventricular tachycardia, usually require further treatment. HOME CARE INSTRUCTIONS   Avoid:  Caffeinated coffee, tea, soft drinks, diet pills, and energy drinks.  Chocolate.  Alcohol.  Stop smoking if you smoke.  Reduce your stress and anxiety. Things that can help you relax include:  A method of controlling things in your body, such as your heartbeats, with your mind (biofeedback).  Yoga.  Meditation.  Physical activity such as swimming, jogging, or walking.  Get plenty of rest and sleep. SEEK MEDICAL CARE IF:   You continue to have a fast or irregular heartbeat beyond 24 hours.  Your palpitations occur more often. SEEK IMMEDIATE MEDICAL CARE IF:  You have chest pain or shortness of breath.  You have a severe headache.  You feel dizzy or you faint. MAKE SURE YOU:  Understand these instructions.  Will watch your condition.  Will get help right away if you are not doing well or get worse. Document Released: 01/31/2000 Document Revised: 02/07/2013 Document Reviewed: 04/03/2011 [facility] Patient Information 2015 [place], [place]. This information is not intended to replace advice given to you by your health care provider. Make sure you discuss any questions you have with your health care provider.

## 2013-11-11 NOTE — ED Notes (Signed)
Pt states his heart "starts racing especially at night" pt believes he may have sleep apnea "and i stop breathing"

## 2013-11-27 ENCOUNTER — Other Ambulatory Visit (HOSPITAL_BASED_OUTPATIENT_CLINIC_OR_DEPARTMENT_OTHER): Payer: Medicare Other

## 2013-11-27 ENCOUNTER — Ambulatory Visit (HOSPITAL_BASED_OUTPATIENT_CLINIC_OR_DEPARTMENT_OTHER): Payer: Medicare Other | Admitting: Oncology

## 2013-11-27 ENCOUNTER — Telehealth: Payer: Self-pay | Admitting: Oncology

## 2013-11-27 VITALS — BP 139/51 | HR 72 | Temp 98.3°F | Resp 18 | Ht 65.0 in | Wt 176.3 lb

## 2013-11-27 DIAGNOSIS — IMO0002 Reserved for concepts with insufficient information to code with codable children: Secondary | ICD-10-CM

## 2013-11-27 DIAGNOSIS — C166 Malignant neoplasm of greater curvature of stomach, unspecified: Secondary | ICD-10-CM

## 2013-11-27 DIAGNOSIS — N189 Chronic kidney disease, unspecified: Secondary | ICD-10-CM

## 2013-11-27 LAB — CBC WITH DIFFERENTIAL/PLATELET
BASO%: 0.8 % (ref 0.0–2.0)
Basophils Absolute: 0.1 10*3/uL (ref 0.0–0.1)
EOS ABS: 0.2 10*3/uL (ref 0.0–0.5)
EOS%: 2.8 % (ref 0.0–7.0)
HCT: 36.2 % — ABNORMAL LOW (ref 38.4–49.9)
HGB: 11.8 g/dL — ABNORMAL LOW (ref 13.0–17.1)
LYMPH#: 1.9 10*3/uL (ref 0.9–3.3)
LYMPH%: 26.3 % (ref 14.0–49.0)
MCH: 34.9 pg — ABNORMAL HIGH (ref 27.2–33.4)
MCHC: 32.7 g/dL (ref 32.0–36.0)
MCV: 106.6 fL — ABNORMAL HIGH (ref 79.3–98.0)
MONO#: 0.4 10*3/uL (ref 0.1–0.9)
MONO%: 5.1 % (ref 0.0–14.0)
NEUT%: 65 % (ref 39.0–75.0)
NEUTROS ABS: 4.8 10*3/uL (ref 1.5–6.5)
Platelets: 165 10*3/uL (ref 140–400)
RBC: 3.39 10*6/uL — AB (ref 4.20–5.82)
RDW: 14.6 % (ref 11.0–14.6)
WBC: 7.4 10*3/uL (ref 4.0–10.3)

## 2013-11-27 LAB — COMPREHENSIVE METABOLIC PANEL (CC13)
ALBUMIN: 3.3 g/dL — AB (ref 3.5–5.0)
ALT: 18 U/L (ref 0–55)
ANION GAP: 8 meq/L (ref 3–11)
AST: 38 U/L — ABNORMAL HIGH (ref 5–34)
Alkaline Phosphatase: 61 U/L (ref 40–150)
BUN: 10.7 mg/dL (ref 7.0–26.0)
CALCIUM: 8.5 mg/dL (ref 8.4–10.4)
CHLORIDE: 111 meq/L — AB (ref 98–109)
CO2: 27 meq/L (ref 22–29)
Creatinine: 1.4 mg/dL — ABNORMAL HIGH (ref 0.7–1.3)
GLUCOSE: 171 mg/dL — AB (ref 70–140)
POTASSIUM: 4 meq/L (ref 3.5–5.1)
SODIUM: 146 meq/L — AB (ref 136–145)
TOTAL PROTEIN: 5.9 g/dL — AB (ref 6.4–8.3)
Total Bilirubin: 0.34 mg/dL (ref 0.20–1.20)

## 2013-11-27 NOTE — Progress Notes (Signed)
  [facility] OFFICE PROGRESS NOTE   Diagnosis: Gastrointestinal stromal  INTERVAL HISTORY:   [name] returns as scheduled. He continues [place]. He does not have significant diarrhea. No rash. He reports the blood sugar has been under good control. He was seen in the emergency room with palpitations 11/11/2013.  Objective:  Vital signs in last 24 hours:  Blood pressure 139/51, pulse 72, temperature 98.3 F (36.8 C), temperature source Oral, resp. rate 18, height 5\' 5"  (1.651 m), weight 176 lb 4.8 oz (79.969 kg), SpO2 100.00%.    HEENT: No thrush or ulcers Resp: Lungs clear bilaterally Cardio: Regular rate and rhythm with premature beats GI: No hepatosplenomegaly, nontender, no mass Vascular: Trace low leg edema bilaterally  Skin: No rash    Lab Results:  Lab Results  Component Value Date   WBC 7.4 11/27/2013   HGB 11.8* 11/27/2013   HCT 36.2* 11/27/2013   MCV 106.6* 11/27/2013   PLT 165 11/27/2013   NEUTROABS 4.8 11/27/2013   Medications: I have reviewed the patient's current medications.  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  Initiation of adjuvant Gleevec on 07/26/2012.  2. Diabetes.  3. History of coronary artery disease.  4. Chronic renal failure.  5. Admission with gastrointestinal bleeding and severe anemia 04/05/2012.  6. Hypotension/failure to thrive when he was here on 05/17/2012. His performance status continues to be improved.  7. 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  8. Admission with symptomatic hypoglycemia in [facility] week of 08/01/2012.  9. Diarrhea-likely secondary to [place]. He takes Imodium as needed.  10. Hair loss. Predated start of [place].  11. Weight loss.  Improved   Disposition:  [name] remains in clinical remission from the gastrointestinal stromal tumor. He will continue Gleevec. He will return for an office and lab visit in 6 weeks.  Betsy Coder, MD  11/27/2013  4:17 PM

## 2013-11-27 NOTE — Telephone Encounter (Signed)
Pt confirmed labs/ov per 10/12 POF, gave pt AVS.... KJ °

## 2013-11-30 ENCOUNTER — Other Ambulatory Visit: Payer: Self-pay | Admitting: Oncology

## 2013-11-30 DIAGNOSIS — D499 Neoplasm of unspecified behavior of unspecified site: Secondary | ICD-10-CM

## 2014-01-04 ENCOUNTER — Other Ambulatory Visit: Payer: Self-pay | Admitting: Oncology

## 2014-01-04 DIAGNOSIS — D499 Neoplasm of unspecified behavior of unspecified site: Secondary | ICD-10-CM

## 2014-01-08 ENCOUNTER — Ambulatory Visit (HOSPITAL_BASED_OUTPATIENT_CLINIC_OR_DEPARTMENT_OTHER): Payer: Medicare Other | Admitting: Nurse Practitioner

## 2014-01-08 ENCOUNTER — Telehealth: Payer: Self-pay | Admitting: Nurse Practitioner

## 2014-01-08 ENCOUNTER — Other Ambulatory Visit (HOSPITAL_BASED_OUTPATIENT_CLINIC_OR_DEPARTMENT_OTHER): Payer: Medicare Other

## 2014-01-08 VITALS — BP 150/82 | HR 62 | Temp 97.9°F | Resp 18 | Ht 65.0 in | Wt 179.9 lb

## 2014-01-08 DIAGNOSIS — IMO0002 Reserved for concepts with insufficient information to code with codable children: Secondary | ICD-10-CM

## 2014-01-08 DIAGNOSIS — C166 Malignant neoplasm of greater curvature of stomach, unspecified: Secondary | ICD-10-CM

## 2014-01-08 LAB — CBC WITH DIFFERENTIAL/PLATELET
BASO%: 0.3 % (ref 0.0–2.0)
Basophils Absolute: 0 10e3/uL (ref 0.0–0.1)
EOS%: 3.1 % (ref 0.0–7.0)
Eosinophils Absolute: 0.2 10e3/uL (ref 0.0–0.5)
HCT: 36.7 % — ABNORMAL LOW (ref 38.4–49.9)
HGB: 12.4 g/dL — ABNORMAL LOW (ref 13.0–17.1)
LYMPH%: 31.5 % (ref 14.0–49.0)
MCH: 35.3 pg — ABNORMAL HIGH (ref 27.2–33.4)
MCHC: 33.8 g/dL (ref 32.0–36.0)
MCV: 104.6 fL — ABNORMAL HIGH (ref 79.3–98.0)
MONO#: 0.3 10e3/uL (ref 0.1–0.9)
MONO%: 4.6 % (ref 0.0–14.0)
NEUT#: 4.2 10e3/uL (ref 1.5–6.5)
NEUT%: 60.5 % (ref 39.0–75.0)
Platelets: 144 10e3/uL (ref 140–400)
RBC: 3.51 10e6/uL — ABNORMAL LOW (ref 4.20–5.82)
RDW: 14.3 % (ref 11.0–14.6)
WBC: 7 10e3/uL (ref 4.0–10.3)
lymph#: 2.2 10e3/uL (ref 0.9–3.3)

## 2014-01-08 LAB — COMPREHENSIVE METABOLIC PANEL (CC13)
ALT: 10 U/L (ref 0–55)
AST: 23 U/L (ref 5–34)
Albumin: 3.1 g/dL — ABNORMAL LOW (ref 3.5–5.0)
Alkaline Phosphatase: 59 U/L (ref 40–150)
Anion Gap: 8 meq/L (ref 3–11)
BUN: 14.4 mg/dL (ref 7.0–26.0)
CO2: 29 meq/L (ref 22–29)
Calcium: 8.3 mg/dL — ABNORMAL LOW (ref 8.4–10.4)
Chloride: 108 meq/L (ref 98–109)
Creatinine: 1.4 mg/dL — ABNORMAL HIGH (ref 0.7–1.3)
Glucose: 125 mg/dL (ref 70–140)
Potassium: 4 meq/L (ref 3.5–5.1)
Sodium: 145 meq/L (ref 136–145)
Total Bilirubin: 0.42 mg/dL (ref 0.20–1.20)
Total Protein: 5.3 g/dL — ABNORMAL LOW (ref 6.4–8.3)

## 2014-01-08 NOTE — Telephone Encounter (Signed)
Gave avs & cal for Jan 2016. °

## 2014-01-08 NOTE — Progress Notes (Signed)
  [name]   Diagnosis:  Gastrointestinal stromal tumor  INTERVAL HISTORY:   [name] returns as scheduled. He continues [place]. He denies nausea/vomiting. No significant diarrhea. No mouth sores. No leg edema. No skin rash.  Objective:  Vital signs in last 24 hours:  Blood pressure 150/82, pulse 62, temperature 97.9 F (36.6 C), temperature source Oral, resp. rate 18, height 5\' 5"  (1.651 m), weight 179 lb 14.4 oz (81.602 kg).    HEENT: no thrush or ulcers. Resp: lungs clear bilaterally. Cardio: regular rate and rhythm. GI: abdomen soft and nontender. No hepatomegaly. No mass. Vascular: very minimal pretibial edema bilaterally. Neuro: alert and oriented.  Skin: no rash.    Lab Results:  Lab Results  Component Value Date   WBC 7.0 01/08/2014   HGB 12.4* 01/08/2014   HCT 36.7* 01/08/2014   MCV 104.6* 01/08/2014   PLT 144 01/08/2014   NEUTROABS 4.2 01/08/2014    Imaging:  No results found.  Medications: I have reviewed the patient's current medications.  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   Initiation of adjuvant Gleevec on 07/26/2012. 2. Diabetes.  3. History of coronary artery disease.  4. Chronic renal failure.  5. Admission with gastrointestinal bleeding and severe anemia 04/05/2012.  6. Hypotension/failure to thrive when he was here on 05/17/2012. His performance status continues to be improved.  7. 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  8. Admission with symptomatic hypoglycemia in [facility] week of 08/01/2012.  9. Diarrhea-likely secondary to [place]. He takes Imodium as needed.  10. Hair loss. Predated start of [place].  11. Weight loss.  Improved.   Disposition: [name] appears stable. He remains in clinical remission from the gastrointestinal stromal tumor. He will continue Gleevec. He will return for a followup visit and labs in 6 weeks.    Ned Card ANP/GNP-BC   01/08/2014  11:33 AM

## 2014-01-09 ENCOUNTER — Encounter: Payer: Self-pay | Admitting: Neurology

## 2014-01-19 ENCOUNTER — Telehealth: Payer: Self-pay | Admitting: *Deleted

## 2014-01-19 NOTE — Telephone Encounter (Signed)
Received phone call from patient stating that he has had diarrhea 2-3 times daily for the past 3-4 days.  Informed  Elby Showers.Marcello Moores, NP.  Informed patient to push fluids over the weekend and to call back Monday with an update.  Per Elby Showers. Marcello Moores, NP.  Patient verbalized understanding.

## 2014-01-23 ENCOUNTER — Emergency Department (HOSPITAL_COMMUNITY)
Admission: EM | Admit: 2014-01-23 | Discharge: 2014-01-23 | Disposition: A | Discharge status: Home / Self Care | Payer: Medicare Other | Attending: Emergency Medicine | Admitting: Emergency Medicine

## 2014-01-23 ENCOUNTER — Emergency Department (HOSPITAL_COMMUNITY): Payer: Medicare Other

## 2014-01-23 ENCOUNTER — Encounter (HOSPITAL_COMMUNITY): Payer: Self-pay | Admitting: Physical Medicine and Rehabilitation

## 2014-01-23 DIAGNOSIS — F329 Major depressive disorder, single episode, unspecified: Secondary | ICD-10-CM | POA: Diagnosis not present

## 2014-01-23 DIAGNOSIS — Z7952 Long term (current) use of systemic steroids: Secondary | ICD-10-CM | POA: Insufficient documentation

## 2014-01-23 DIAGNOSIS — I10 Essential (primary) hypertension: Secondary | ICD-10-CM | POA: Diagnosis not present

## 2014-01-23 DIAGNOSIS — K219 Gastro-esophageal reflux disease without esophagitis: Secondary | ICD-10-CM | POA: Diagnosis not present

## 2014-01-23 DIAGNOSIS — Y9301 Activity, walking, marching and hiking: Secondary | ICD-10-CM | POA: Diagnosis not present

## 2014-01-23 DIAGNOSIS — Z8659 Personal history of other mental and behavioral disorders: Secondary | ICD-10-CM | POA: Insufficient documentation

## 2014-01-23 DIAGNOSIS — Z8673 Personal history of transient ischemic attack (TIA), and cerebral infarction without residual deficits: Secondary | ICD-10-CM | POA: Diagnosis not present

## 2014-01-23 DIAGNOSIS — E119 Type 2 diabetes mellitus without complications: Secondary | ICD-10-CM | POA: Insufficient documentation

## 2014-01-23 DIAGNOSIS — W2209XA Striking against other stationary object, initial encounter: Secondary | ICD-10-CM | POA: Insufficient documentation

## 2014-01-23 DIAGNOSIS — Z7982 Long term (current) use of aspirin: Secondary | ICD-10-CM | POA: Diagnosis not present

## 2014-01-23 DIAGNOSIS — Z8509 Personal history of malignant neoplasm of other digestive organs: Secondary | ICD-10-CM | POA: Diagnosis not present

## 2014-01-23 DIAGNOSIS — I2510 Atherosclerotic heart disease of native coronary artery without angina pectoris: Secondary | ICD-10-CM | POA: Insufficient documentation

## 2014-01-23 DIAGNOSIS — R9082 White matter disease, unspecified: Secondary | ICD-10-CM | POA: Diagnosis not present

## 2014-01-23 DIAGNOSIS — Y9289 Other specified places as the place of occurrence of the external cause: Secondary | ICD-10-CM | POA: Diagnosis not present

## 2014-01-23 DIAGNOSIS — Z79899 Other long term (current) drug therapy: Secondary | ICD-10-CM | POA: Diagnosis not present

## 2014-01-23 DIAGNOSIS — M1990 Unspecified osteoarthritis, unspecified site: Secondary | ICD-10-CM | POA: Diagnosis not present

## 2014-01-23 DIAGNOSIS — Z955 Presence of coronary angioplasty implant and graft: Secondary | ICD-10-CM | POA: Diagnosis not present

## 2014-01-23 DIAGNOSIS — S0990XA Unspecified injury of head, initial encounter: Secondary | ICD-10-CM

## 2014-01-23 DIAGNOSIS — Z9889 Other specified postprocedural states: Secondary | ICD-10-CM | POA: Diagnosis not present

## 2014-01-23 DIAGNOSIS — Y998 Other external cause status: Secondary | ICD-10-CM | POA: Insufficient documentation

## 2014-01-23 DIAGNOSIS — Z951 Presence of aortocoronary bypass graft: Secondary | ICD-10-CM | POA: Diagnosis not present

## 2014-01-23 DIAGNOSIS — S0181XA Laceration without foreign body of other part of head, initial encounter: Secondary | ICD-10-CM | POA: Insufficient documentation

## 2014-01-23 DIAGNOSIS — Z87448 Personal history of other diseases of urinary system: Secondary | ICD-10-CM | POA: Insufficient documentation

## 2014-01-23 DIAGNOSIS — S0191XA Laceration without foreign body of unspecified part of head, initial encounter: Secondary | ICD-10-CM

## 2014-01-23 MED ORDER — TETANUS-DIPHTH-ACELL PERTUSSIS 5-2.5-18.5 LF-MCG/0.5 IM SUSP
0.5000 mL | Freq: Once | INTRAMUSCULAR | Status: AC
  Administered 2014-01-23: 0.5 mL via INTRAMUSCULAR
  Filled 2014-01-23: qty 0.5

## 2014-01-23 NOTE — ED Provider Notes (Signed)
CSN: [id number]     Arrival date & time 01/23/14  1748 History   First MD Initiated Contact with Patient 01/23/14 1859     Chief Complaint  Patient presents with  . Laceration     (Consider location/radiation/quality/duration/timing/severity/associated sxs/prior Treatment) Patient is a 73 y.o. male presenting with skin laceration. The history is provided by the patient and medical records.  Laceration  This is a 73 year old male with past medical history significant for hypertension, diabetes, stroke, coronary artery disease, depression, presenting to the ED for a head laceration. Patient states he awoke around 5:30 this morning, attempted to walk to the kitchen in the dark and hit his right forehead on a cabinet door. He did not lose consciousness. He states laceration has been persistently bleeding throughout the day today. Date of last tetanus unknown. He denies any pain of his right eye, visual disturbance, headache, dizziness, lightheadedness, tinnitus, changes in speech, or difficulty ambulating.  Patient is on daily ASA and plavix for stroke prevention.  VS stable on arrival.  Past Medical History  Diagnosis Date  . Hypertension   . Diabetes mellitus type 2 with complications     CAD, CVA, ED; currently on insulin  . Stroke 73 years old  . High cholesterol   . History of atrial fibrillation     No recurrence in over 5 years.  Marland Kitchen CAD in native artery 1997    PTCA D1, D2  . S/P CABG x 5 2002    LIMA-LAD, SVG-D1-OM2, SVG-D2, SVG-rPDA  . Abnormal findings on cardiac catheterization March 2004    Tandem mid and distal RCA stenosis --> PCI below; proximal D1 lesion--> PCI below;   Marland Kitchen CAD S/P percutaneous coronary angioplasty 04/2002    PCI mRCA, dRCA; 3.5 mm x12 mm express BMS overlapping 3.0 mm x 8 mm Zeta BMS proximally; PCI-D1 2.75 mm 4 mm Taxus DES;   . CAD (coronary artery disease) of bypass graft March 2004    Remaining cath findings: SVG-RCA 100% occluded, SVG-D1-OM2 occluded;  patent LIMA-LAD and SVG-OM1  . Depression   . Erectile dysfunction   . 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 Memory loss   . Arthritis   . GERD (gastroesophageal reflux disease)    Past Surgical History  Procedure Laterality Date  . Eye surgery      cataracts bilateral  . Carpal tunnel release      bilateral  . Tonsillectomy    . Penile prosthesis implant  03/15/2012    Procedure: PENILE PROTHESIS INFLATABLE;  Surgeon: Hanley Ben, MD;  Location: [facility];  Service: Urology;  Laterality: N/A;  . Circumcision  03/15/2012    Procedure: CIRCUMCISION ADULT;  Surgeon: Hanley Ben, MD;  Location: [facility];  Service: Urology;  Laterality: N/A;  . Esophagogastroduodenoscopy N/A 04/06/2012    Procedure: ESOPHAGOGASTRODUODENOSCOPY (EGD);  Surgeon: Wonda Horner, MD;  Location: [facility] ENDOSCOPY;  Service: Endoscopy;  Laterality: N/A;  . Colonoscopy N/A 04/14/2012    Procedure: COLONOSCOPY;  Surgeon: Missy Sabins, MD;  Location: [facility];  Service: Endoscopy;  Laterality: N/A;  . Gastric resection N/A 04/15/2012    Procedure: Partial Gastrectomy;  Surgeon: Gwenyth Ober, MD;  Location: [facility];  Service: General;  Laterality: N/A;  . Coronary artery bypass graft  04/21/2000    LIMA-LAD,SVG-D1 sequential to OM2 , SVG-D2 ,SVG-RPDA  . Cardiac catheterization  1997    PCI -D1 and D2  . Cardiac catheterization  05/02/2002    SEE angioplasty  . Cardiac catheterization  04/08/2000    LV MILDLY ENLARGED,EF 50%;MITRAL AND AORTIC VALVES  APPEAR NORMAL  . Coronary angioplasty  05/02/2002    mid RCA ,distal RCA and first diagonal branch. RCA 3.50 x 12 mm Express bare-metal stent,Zeta stent 3.o x8 in the more proximal portion;D1 stented with 2.75 x 24 mm Taxus DES stent; showed total occulsion of the RCA vein graft as well as the vein graft to the D1 and OM ,remaining grafts wer normally to OM and LAD  . Transthoracic echocardiogram  June 2012     Normal LV size and function. EF 55%. Mild LA dilation.  Marland Kitchen Nm myoview ltd  January 2015    Low risk stress nuclear study with mild diaphragmatic attenuation.  Gated images were not able to be obtained due to frequent PVCs.   Family History  Problem Relation Age of Onset  . Heart attack Mother   . Heart attack Father    History  Substance Use Topics  . Smoking status: Never Smoker   . Smokeless tobacco: Never Used  . Alcohol Use: No    Review of Systems  Skin: Positive for wound.  All other systems reviewed and are negative.     Allergies  Morphine and related  Home Medications   Prior to Admission medications   Medication Sig Start Date End Date Taking? Authorizing Provider  amLODipine (NORVASC) 5 MG tablet Take 10 mg by mouth daily.     Historical Provider, MD  aspirin 325 MG tablet Take 325 mg by mouth daily.    Historical Provider, MD  atorvastatin (LIPITOR) 40 MG tablet Take 40 mg by mouth daily.    Historical Provider, MD  Biotin 7500 MCG TABS Take 7,500 mcg by mouth.    Historical Provider, MD  Cholecalciferol (VITAMIN D-3) 5000 UNITS TABS Take 1 tablet by mouth daily.    Historical Provider, MD  clopidogrel (PLAVIX) 75 MG tablet Take 75 mg by mouth daily.    Historical Provider, MD  DULoxetine (CYMBALTA) 60 MG capsule Take 60 mg by mouth daily.  08/11/13   Historical Provider, MD  ferrous sulfate 325 (65 FE) MG EC tablet Take 325 mg by mouth daily. 12/27/12   Historical Provider, MD  folic acid (FOLVITE) 1 MG tablet Take 1 mg by mouth daily.  06/06/13   Historical Provider, MD  furosemide (LASIX) 20 MG tablet Take 20 mg by mouth daily.  09/27/12   Historical Provider, MD  GLEEVEC 400 MG tablet TAKE 1 TABLET BY MOUTH ONCE DAILY 01/04/14   Ladell Pier, MD  glimepiride (AMARYL) 4 MG tablet Take 4 mg by mouth daily.  06/01/12   Historical Provider, MD  HYDROcodone-acetaminophen (NORCO/VICODIN) 5-325 MG per tablet Every 4-6 hours as needed for pain 11/21/13   Historical  Provider, MD  loperamide (IMODIUM) 2 MG capsule Take 2 mg by mouth as needed for diarrhea or loose stools (max 8 24 hours).    Historical Provider, MD  metoprolol tartrate (LOPRESSOR) 25 MG tablet Take 12.5 mg by mouth 2 (two) times daily.    Historical Provider, MD  Multiple Vitamin (MULTI-VITAMINS) TABS Take 1 tablet by mouth daily.  05/19/12   Historical Provider, MD  NITROSTAT 0.4 MG SL tablet Place 0.4 mg under the tongue as needed for chest pain.  03/20/12   Historical Provider, MD  pantoprazole (PROTONIX) 40 MG tablet Take 40 mg by mouth 2 (two) times daily.  05/10/12   Historical  Provider, MD  potassium chloride SA (K-DUR,KLOR-CON) 20 MEQ tablet TAKE 1 TABLET TWICE DAILY 12/01/13   Ladell Pier, MD  QUEtiapine (SEROQUEL) 200 MG tablet Take 1 tablet (200 mg total) by mouth daily at 8 pm. 04/23/12   Bynum Bellows, MD  RAPAFLO 8 MG CAPS capsule Take 8 mg by mouth daily with breakfast.  06/02/12   Historical Provider, MD  traZODone (DESYREL) 100 MG tablet Take 100 mg by mouth at bedtime.  04/28/13   Historical Provider, MD  triamcinolone cream (KENALOG) 0.1 % Apply 1 application topically 2 (two) times daily.  03/30/13   Historical Provider, MD  vitamin B-12 (CYANOCOBALAMIN) 1000 MCG tablet Take 1,000 mcg by mouth daily.  06/06/13   Historical Provider, MD   BP 180/69 mmHg  Pulse 55  Temp(Src) 97.6 F (36.4 C) (Oral)  Resp 18  Ht 5\' 6"  (1.676 m)  Wt 178 lb (80.74 kg)  BMI 28.74 kg/m2  SpO2 95%   Physical Exam  Constitutional: He is oriented to person, place, and time. He appears well-developed and well-nourished. No distress.  HENT:  Head: Normocephalic and atraumatic.  Mouth/Throat: Oropharynx is clear and moist.  0.5 cm laceration just above right eyebrow, active bleeding on arrival, no bony deformities  Eyes: Conjunctivae, EOM and lids are normal. Pupils are equal, round, and reactive to light. No foreign body present in the right eye. Right conjunctiva is not injected. Right conjunctiva  has no hemorrhage. Right eye exhibits normal extraocular motion.  No foreign body right eye, EOMs intact without signs of entrapment  Neck: Normal range of motion. Neck supple.  Cardiovascular: Normal rate, regular rhythm and normal heart sounds.   Pulmonary/Chest: Effort normal and breath sounds normal. No respiratory distress. He has no wheezes.  Abdominal: Soft. Bowel sounds are normal. There is no tenderness. There is no guarding.  Musculoskeletal: Normal range of motion.       Cervical back: Normal.  Cervical spine nontender, full range of motion without pain, normal strength and sensation of bilateral upper extremities  Neurological: He is alert and oriented to person, place, and time.  AAOx3, answering questions appropriately; equal strength UE and LE bilaterally; CN grossly intact; moves all extremities appropriately without ataxia; no focal neuro deficits or facial asymmetry appreciated  Skin: Skin is warm and dry. He is not diaphoretic.  Psychiatric: He has a normal mood and affect.  Nursing note and vitals reviewed.   ED Course  Procedures (including critical care time)  LACERATION REPAIR Performed by: Larene Pickett Authorized by: Larene Pickett Consent: Verbal consent obtained. Risks and benefits: risks, benefits and alternatives were discussed Consent given by: patient Patient identity confirmed: provided demographic data Prepped and Draped in normal sterile fashion Wound explored  Laceration Location: right forehead  Laceration Length: 0.5 cm  No Foreign Bodies seen or palpated  Anesthesia: local infiltration  Local anesthetic: none  Anesthetic total: 0 ml  Irrigation method: syringe Amount of cleaning: standard  Skin closure: dermabond  Number of sutures: 0  Technique: n/a  Patient tolerance: Patient tolerated the procedure well with no immediate complications.  Labs Review Labs Reviewed - No data to display  Imaging Review Ct Head Wo  Contrast  01/23/2014   CLINICAL DATA:  Struck head on kitchen cabinet last night while walking from one room to another in the dark, RIGHT supraorbital laceration, head injury, no loss of consciousness, initial encounter, history hypertension, diabetes, stroke, coronary artery disease  EXAM: CT HEAD WITHOUT CONTRAST  TECHNIQUE: Contiguous axial images were obtained from the base of the skull through the vertex without intravenous contrast.  COMPARISON:  04/29/2012  FINDINGS: Mild atrophy.  Normal ventricular morphology.  No midline shift or mass effect.  Small vessel chronic ischemic changes of deep cerebral white matter.  No intracranial hemorrhage, mass lesion or evidence acute infarction.  Old RIGHT cerebellar infarct again noted.  No extra-axial fluid collections.  No significant bone or sinus abnormalities.  RIGHT supraorbital dressing artifacts.  IMPRESSION: Atrophy with small vessel chronic ischemic changes of deep cerebral white matter.  Old RIGHT cerebellar infarct.  No acute intracranial abnormalities.   Electronically Signed   By: Lavonia Dana [name]   On: 01/23/2014 21:16     EKG Interpretation None      MDM   Final diagnoses:  Laceration of head, initial encounter   73 year old male with head injury earlier this morning while walking in the dark. On exam, he has a half centimeter laceration to his right forehead just above his right eyebrow. There is active bleeding on arrival. Date of last tetanus unknown.  Neurologic exam is nonfocal. Cervical spine is nontender, cleared by NEXUS criteria. CT head obtained which is negative for acute findings. Laceration repaired with Dermabond, patient tolerated well. Tetanus updated.  He was discharged home with close follow-up.  Discussed plan with patient, he/she acknowledged understanding and agreed with plan of care.  Return precautions given for new or worsening symptoms.  Larene Pickett, PA-C 01/23/14 2227  Artis Delay, MD 01/27/14 [phone]

## 2014-01-23 NOTE — Discharge Instructions (Signed)
Your head CT was negative. Laceration was repaired with Dermabond, this will gradually slough off over the next few days. Try not to pull it off. Follow-up with your primary care physician. Return to the ED for any new concerns.

## 2014-01-23 NOTE — ED Notes (Signed)
Pt a/o x 4 on d/c, escorted out in wheelchair.

## 2014-01-23 NOTE — ED Notes (Signed)
Pt presents to department for evaluation of R sided forehead laceration. 0.5cm laceration to R forehead, bleeding upon arrival to ED. States he was walking in kitchen last night when head struck cabinet. Tetanus unknown. Pt is alert and oriented x4. No injury to R eye.

## 2014-02-13 ENCOUNTER — Other Ambulatory Visit: Payer: Self-pay | Admitting: Oncology

## 2014-02-13 DIAGNOSIS — IMO0002 Reserved for concepts with insufficient information to code with codable children: Secondary | ICD-10-CM

## 2014-02-19 ENCOUNTER — Ambulatory Visit: Payer: Medicare Other | Admitting: Nurse Practitioner

## 2014-02-19 ENCOUNTER — Other Ambulatory Visit: Payer: Medicare Other

## 2014-03-09 ENCOUNTER — Other Ambulatory Visit: Payer: Self-pay | Admitting: Oncology

## 2014-03-09 DIAGNOSIS — C269 Malignant neoplasm of ill-defined sites within the digestive system: Secondary | ICD-10-CM

## 2014-03-13 ENCOUNTER — Telehealth: Payer: Self-pay | Admitting: Oncology

## 2014-03-13 NOTE — Telephone Encounter (Signed)
Called pt to follow up on missed appt earlier this month. He reports he was forgot appt. Requests to reschedule for as soon as possible. Pt given appt for 1/28 at 2:15 with Lattie Haw, NP. Lab 1:45. He voiced understanding and appreciation for call.

## 2014-03-13 NOTE — Telephone Encounter (Signed)
S/w pt confirming labs/ov per 01/26 POF...Marland KitchenMarland KitchenMarland Kitchen KJ

## 2014-03-15 ENCOUNTER — Other Ambulatory Visit (HOSPITAL_BASED_OUTPATIENT_CLINIC_OR_DEPARTMENT_OTHER): Payer: Medicare Other

## 2014-03-15 ENCOUNTER — Ambulatory Visit (HOSPITAL_BASED_OUTPATIENT_CLINIC_OR_DEPARTMENT_OTHER): Payer: Medicare Other | Admitting: Nurse Practitioner

## 2014-03-15 ENCOUNTER — Telehealth: Payer: Self-pay | Admitting: Nurse Practitioner

## 2014-03-15 ENCOUNTER — Encounter: Payer: Self-pay | Admitting: Nurse Practitioner

## 2014-03-15 VITALS — BP 164/52 | HR 56 | Temp 98.0°F | Resp 18 | Ht 66.0 in | Wt 180.8 lb

## 2014-03-15 DIAGNOSIS — IMO0002 Reserved for concepts with insufficient information to code with codable children: Secondary | ICD-10-CM

## 2014-03-15 DIAGNOSIS — C166 Malignant neoplasm of greater curvature of stomach, unspecified: Secondary | ICD-10-CM

## 2014-03-15 DIAGNOSIS — N189 Chronic kidney disease, unspecified: Secondary | ICD-10-CM

## 2014-03-15 DIAGNOSIS — R197 Diarrhea, unspecified: Secondary | ICD-10-CM

## 2014-03-15 DIAGNOSIS — E119 Type 2 diabetes mellitus without complications: Secondary | ICD-10-CM

## 2014-03-15 LAB — COMPREHENSIVE METABOLIC PANEL (CC13)
ALBUMIN: 3.3 g/dL — AB (ref 3.5–5.0)
ALT: 13 U/L (ref 0–55)
AST: 34 U/L (ref 5–34)
Alkaline Phosphatase: 64 U/L (ref 40–150)
Anion Gap: 7 mEq/L (ref 3–11)
BUN: 16.3 mg/dL (ref 7.0–26.0)
CALCIUM: 8 mg/dL — AB (ref 8.4–10.4)
CO2: 26 mEq/L (ref 22–29)
Chloride: 110 mEq/L — ABNORMAL HIGH (ref 98–109)
Creatinine: 1.4 mg/dL — ABNORMAL HIGH (ref 0.7–1.3)
EGFR: 59 mL/min/{1.73_m2} — AB (ref >90)
Glucose: 108 mg/dl (ref 70–140)
Potassium: 3.9 mEq/L (ref 3.5–5.1)
Sodium: 143 mEq/L (ref 136–145)
Total Bilirubin: 0.32 mg/dL (ref 0.20–1.20)
Total Protein: 5.7 g/dL — ABNORMAL LOW (ref 6.4–8.3)

## 2014-03-15 LAB — CBC WITH DIFFERENTIAL/PLATELET
BASO%: 0.5 % (ref 0.0–2.0)
Basophils Absolute: 0 10*3/uL (ref 0.0–0.1)
EOS%: 2.4 % (ref 0.0–7.0)
Eosinophils Absolute: 0.2 10*3/uL (ref 0.0–0.5)
HEMATOCRIT: 35.1 % — AB (ref 38.4–49.9)
HGB: 11.3 g/dL — ABNORMAL LOW (ref 13.0–17.1)
LYMPH%: 24.7 % (ref 14.0–49.0)
MCH: 35.1 pg — AB (ref 27.2–33.4)
MCHC: 32.2 g/dL (ref 32.0–36.0)
MCV: 108.8 fL — ABNORMAL HIGH (ref 79.3–98.0)
MONO#: 0.4 10*3/uL (ref 0.1–0.9)
MONO%: 6.1 % (ref 0.0–14.0)
NEUT#: 4.4 10*3/uL (ref 1.5–6.5)
NEUT%: 66.3 % (ref 39.0–75.0)
PLATELETS: 166 10*3/uL (ref 140–400)
RBC: 3.23 10*6/uL — ABNORMAL LOW (ref 4.20–5.82)
RDW: 14.2 % (ref 11.0–14.6)
WBC: 6.7 10*3/uL (ref 4.0–10.3)
lymph#: 1.6 10*3/uL (ref 0.9–3.3)

## 2014-03-15 NOTE — Progress Notes (Signed)
  [name] OFFICE PROGRESS NOTE   Diagnosis:  Gastrointestinal stromal tumor  INTERVAL HISTORY:   [name] returns as scheduled. He continues [place]. He has occasional loose stools. No nausea or vomiting. No leg swelling. He has stable mild dyspnea on exertion. No skin rash.  Objective:  Vital signs in last 24 hours:  Blood pressure 164/52, pulse 56, temperature 98 F (36.7 C), temperature source Oral, resp. rate 18, height 5\' 6"  (1.676 m), weight 180 lb 12.8 oz (82.01 kg), SpO2 100 %.    HEENT: No thrush or ulcers. Resp: Lungs clear bilaterally. Cardio: Irregular. GI: Abdomen soft and nontender. No hepatomegaly. No mass. Vascular: No leg edema.  Skin: No rash.    Lab Results:  Lab Results  Component Value Date   WBC 6.7 03/15/2014   HGB 11.3* 03/15/2014   HCT 35.1* 03/15/2014   MCV 108.8* 03/15/2014   PLT 166 03/15/2014   NEUTROABS 4.4 03/15/2014    Imaging:  No results found.  Medications: I have reviewed the patient's current medications.  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   Initiation of adjuvant Gleevec on 07/26/2012. 2. Diabetes.  3. History of coronary artery disease.  4. Chronic renal failure.  5. Admission with gastrointestinal bleeding and severe anemia 04/05/2012.  6. Hypotension/failure to thrive when he was here on 05/17/2012. His performance status continues to be improved.  7. 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  8. Admission with symptomatic hypoglycemia in [name] week of 08/01/2012.  9. Diarrhea-likely secondary to [name]. He takes Imodium as needed.  10. Hair loss. Predated start of [name].  11. Weight loss. Improved.   Disposition: Mr.  [name] appears stable. He remains in clinical remission from the gastrointestinal stromal tumor. He will continue Gleevec. We scheduled a return visit and labs in 6 weeks.    Ned Card ANP/GNP-BC   03/15/2014  3:09 PM

## 2014-03-15 NOTE — Telephone Encounter (Signed)
Gave avs & calendar for March. °

## 2014-03-16 ENCOUNTER — Other Ambulatory Visit: Payer: Self-pay | Admitting: Oncology

## 2014-04-09 ENCOUNTER — Ambulatory Visit: Payer: Medicare Other | Admitting: Nurse Practitioner

## 2014-04-10 ENCOUNTER — Encounter: Payer: Self-pay | Admitting: Nurse Practitioner

## 2014-04-10 ENCOUNTER — Ambulatory Visit (INDEPENDENT_AMBULATORY_CARE_PROVIDER_SITE_OTHER): Payer: Medicare Other | Admitting: Nurse Practitioner

## 2014-04-10 VITALS — BP 152/58 | HR 61 | Temp 97.1°F | Ht 66.0 in | Wt 183.2 lb

## 2014-04-10 DIAGNOSIS — R413 Other amnesia: Secondary | ICD-10-CM | POA: Diagnosis not present

## 2014-04-10 DIAGNOSIS — G969 Disorder of central nervous system, unspecified: Secondary | ICD-10-CM

## 2014-04-10 DIAGNOSIS — G319 Degenerative disease of nervous system, unspecified: Secondary | ICD-10-CM

## 2014-04-10 NOTE — Progress Notes (Signed)
[facility]  PATIENT: [name] DOB: 11-10-1940   REASON FOR VISIT: Follow-up for memory loss  HISTORY FROM: Patient    HISTORY OF PRESENT ILLNESS: [name] is alone at visit for following up of memory loss. 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 He also has left the stove on, or has forgotten to close his garage door , or the car door, he also does his own finances without much difficulties. He also owns 12 rental objects in [place], and does the finances for those all by himself The patient has a past medical history of diabetes, hypertension and heart disease. His diabetes is uncontrolled , his last A1c was 9.1%. He states that he forgets to take his medication sometimes. He is on 4 meds for his diabetes.  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  He also had acute onset of diplopia in June 2013, presented to [facility] emergency room,, MRI of the brain with and without contrast, has demonstrated chronic moderate periventricular white matter disease, no acute lesions. Laboratory evaluation has demonstrated mild elevated creatinine 1.4, glucose of 117, WBC 13.7, he denies vertigo, lateralized motor or sensory deficit. 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 12. 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  He was admitted on April 29 2012, with failure to thrive and acute renal failure. 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 He was evaluated by gastroenterology and surgery while in the hospital and had transient parenteral nutrition, He is now discharge home, his daughter, and his son-in-law came to check on him regularly, UPDATE Feb 2/22 2016: [name], 74 year old male returns for follow-up. He is alone at today's visit. He has a history of short-term memory problems. He continues to live alone and says there have been no safety issues. 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 His Mini-Mental status exam is stable.  He is taking Gleevac, his DM is under better control, he has good appetite, he could not sleep well, he takes seroquel, it does help his sleeping. Blood pressure is noted to be elevated today at 152/58. He is on several antihypertensive medications and he denies missing any doses. He returns for reevaluation    REVIEW OF SYSTEMS: Full  14 system review of systems performed and notable only for those listed, all others are neg:  Constitutional: neg  Cardiovascular: neg Ear/Nose/Throat: neg  Skin: neg Eyes: neg Respiratory: neg Gastroitestinal: neg  Hematology/Lymphatic: neg  Endocrine:  neg Musculoskeletal:neg Allergy/Immunology: neg Neurological: Memory loss  Psychiatric: neg Sleep : neg   ALLERGIES: Allergies  Allergen Reactions  . Morphine And Related Itching    HOME MEDICATIONS: Outpatient Prescriptions Prior to Visit  Medication Sig Dispense Refill  . amLODipine (NORVASC) 5 MG tablet Take 10 mg by mouth daily.     Marland Kitchen aspirin 325 MG tablet Take 325 mg by mouth daily.    Marland Kitchen atorvastatin (LIPITOR) 40 MG tablet Take 40 mg by mouth daily.    . Biotin 7500 MCG TABS Take 7,500 mcg by mouth.    . Cholecalciferol (VITAMIN D-3) 5000 UNITS TABS Take 1 tablet by mouth daily.    . clopidogrel (PLAVIX) 75 MG tablet Take 75 mg by mouth daily.    . DULoxetine (CYMBALTA) 60 MG capsule Take 60 mg by mouth daily.     . ferrous sulfate 325 (65 FE) MG EC tablet Take 325 mg by mouth daily.    . folic acid (FOLVITE) 1 MG tablet Take 1 mg by mouth daily.     . furosemide (LASIX) 20 MG tablet Take 20 mg by mouth daily.     Marland Kitchen GLEEVEC 400 MG tablet TAKE 1 TABLET BY MOUTH ONCE DAILY 30 tablet 0  . glimepiride (AMARYL) 4 MG tablet Take 4 mg by mouth daily.     Marland Kitchen HYDROcodone-acetaminophen (NORCO/VICODIN) 5-325 MG per tablet Every 4-6 hours as needed for pain    . loperamide (IMODIUM) 2 MG capsule Take 2 mg by mouth as needed for diarrhea or loose stools (max 8 24 hours).    . metoprolol tartrate (LOPRESSOR) 25 MG tablet Take 12.5 mg by mouth 2 (two) times daily.    . Multiple Vitamin (MULTI-VITAMINS) TABS Take 1 tablet by mouth daily.     Marland Kitchen NITROSTAT 0.4 MG SL tablet Place 0.4 mg under the tongue as needed for chest pain.     . pantoprazole (PROTONIX) 40 MG tablet Take 40 mg by mouth 2 (two) times daily.     . potassium chloride SA (K-DUR,KLOR-CON) 20 MEQ tablet TAKE 1 TABLET TWICE DAILY 180 tablet 1  . QUEtiapine (SEROQUEL) 200 MG tablet Take 1 tablet (200 mg total) by mouth daily at 8 pm.    . RAPAFLO 8 MG CAPS capsule Take 8 mg by mouth daily with breakfast.     . triamcinolone cream  (KENALOG) 0.1 % Apply 1 application topically 2 (two) times daily.     . vitamin B-12 (CYANOCOBALAMIN) 1000 MCG tablet Take 1,000 mcg by mouth daily.     . traZODone (DESYREL) 100 MG tablet Take 100 mg by mouth at bedtime.      No facility-administered medications prior to visit.    PAST MEDICAL HISTORY: Past Medical History  Diagnosis Date  . Hypertension   . Diabetes mellitus type 2 with complications     CAD, CVA, ED; currently on insulin  . Stroke 74 years old  . High cholesterol   . History of atrial fibrillation     No recurrence in over 5 years.  Marland Kitchen CAD in native artery 1997    PTCA D1, D2  . S/P CABG x 5 2002    LIMA-LAD, SVG-D1-OM2, SVG-D2, SVG-rPDA  . Abnormal findings on cardiac catheterization March 2004  Tandem mid and distal RCA stenosis --> PCI below; proximal D1 lesion--> PCI below;   Marland Kitchen CAD S/P percutaneous coronary angioplasty 04/2002    PCI mRCA, dRCA; 3.5 mm x12 mm express BMS overlapping 3.0 mm x 8 mm Zeta BMS proximally; PCI-D1 2.75 mm 4 mm Taxus DES;   . CAD (coronary artery disease) of bypass graft March 2004    Remaining cath findings: SVG-RCA 100% occluded, SVG-D1-OM2 occluded; patent LIMA-LAD and SVG-OM1  . Depression   . Erectile dysfunction   . 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 Memory loss   . Arthritis   . GERD (gastroesophageal reflux disease)     PAST SURGICAL HISTORY: Past Surgical History  Procedure Laterality Date  . Eye surgery      cataracts bilateral  . Carpal tunnel release      bilateral  . Tonsillectomy    . Penile prosthesis implant  03/15/2012    Procedure: PENILE PROTHESIS INFLATABLE;  Surgeon: Hanley Ben, MD;  Location: [facility];  Service: Urology;  Laterality: N/A;  . Circumcision  03/15/2012    Procedure: CIRCUMCISION ADULT;  Surgeon: Hanley Ben, MD;  Location: [facility];  Service: Urology;  Laterality: N/A;  . Esophagogastroduodenoscopy N/A 04/06/2012     Procedure: ESOPHAGOGASTRODUODENOSCOPY (EGD);  Surgeon: Wonda Horner, MD;  Location: [facility]OSCOPY;  Service: Endoscopy;  Laterality: N/A;  . Colonoscopy N/A 04/14/2012    Procedure: COLONOSCOPY;  Surgeon: Missy Sabins, MD;  Location: [place];  Service: Endoscopy;  Laterality: N/A;  . Gastric resection N/A 04/15/2012    Procedure: Partial Gastrectomy;  Surgeon: Gwenyth Ober, MD;  Location: [place];  Service: General;  Laterality: N/A;  . Coronary artery bypass graft  04/21/2000    LIMA-LAD,SVG-D1 sequential to OM2 , SVG-D2 ,SVG-RPDA  . Cardiac catheterization  1997    PCI -D1 and D2  . Cardiac catheterization  05/02/2002    SEE angioplasty  . Cardiac catheterization  04/08/2000    LV MILDLY ENLARGED,EF 50%;MITRAL AND AORTIC VALVES  APPEAR NORMAL  . Coronary angioplasty  05/02/2002    mid RCA ,distal RCA and first diagonal branch. RCA 3.50 x 12 mm Express bare-metal stent,Zeta stent 3.o x8 in the more proximal portion;D1 stented with 2.75 x 24 mm Taxus DES stent; showed total occulsion of the RCA vein graft as well as the vein graft to the D1 and OM ,remaining grafts wer normally to OM and LAD  . Transthoracic echocardiogram  June 2012    Normal LV size and function. EF 55%. Mild LA dilation.  Marland Kitchen Nm myoview ltd  January 2015    Low risk stress nuclear study with mild diaphragmatic attenuation.  Gated images were not able to be obtained due to frequent PVCs.    FAMILY HISTORY: Family History  Problem Relation Age of Onset  . Heart attack Mother   . Heart attack Father     SOCIAL HISTORY: History   Social History  . Marital Status: Divorced    Spouse Name: N/A  . Number of Children: 1  . Years of Education: 12   Occupational History  . retired    Social History Main Topics  . Smoking status: Never Smoker   . Smokeless tobacco: Never Used  . Alcohol Use: No  . Drug Use: No  . Sexual Activity: Not on file   Other Topics Concern  . Not on file   Social History Narrative  Divorced. Patient one child daughter. Lives alone-holds furniture and uses walker for ambulation.    Patient is retired. High school education.   Friend comes to home several times day to check on him and fix/bring meals and check glucose      No regular exercise.  Does not drink or smoke.     PHYSICAL EXAM  Filed Vitals:   04/10/14 1038  BP: 152/58  Pulse: 61  Temp: 97.1 F (36.2 C)  TempSrc: Oral  Height: 5\' 6"  (1.676 m)  Weight: 183 lb 3.2 oz (83.099 kg)   Body mass index is 29.58 kg/(m^2). General: Patient is well developed and well groomed.  Head: Symmetric facial features.  Neck: supple no carotid bruits Respiratory: clear to auscultation bilaterally Cardiovascular: regular rate rhythm Skin: No rash, no bruising  Neurologic Exam  Mental Status: Cooperative to history, talking, and casual conversation. MMSE 23/30 , he has difficulty spell world backwards, missed 2 of 3 recall Clock drawing 4 out of 4. AFT 10.  Cranial Nerves: CN II-XII pupils were equal round reactive to light. Visual fields were full on confrontational test. Facial sensation and strength were normal. Hearing was intact to finger rubbing bilaterally. Uvula tongue were midline. Head turning and shoulder shrugging were normal and symmetric. Tongue protrusion into the cheeks strength were normal.  Motor: Normal tone, bulk, and strength. No focal weakness Sensory: Intact and symmetric to light touch Coordination: Normal finger-to-nose, heel-to-shin. There was no dysmetria noticed. Gait and Station: Narrow based gait, Mildly atalgic, moderate difficulty with tandem no assistive device Reflexes: Deep tendon reflexes: Biceps: 2/2, Brachioradialis: 2/2, Triceps: 2/2, Pateller: 1/1, Achilles:absent. Plantar responses are flexor.  DIAGNOSTIC DATA (LABS, IMAGING, TESTING) - I reviewed patient records, labs, notes, testing and imaging myself where available.  Lab Results  Component Value Date   WBC 6.7  03/15/2014   HGB 11.3* 03/15/2014   HCT 35.1* 03/15/2014   MCV 108.8* 03/15/2014   PLT 166 03/15/2014      Component Value Date/Time   NA 143 03/15/2014 1357   NA 141 11/11/2013 1411   K 3.9 03/15/2014 1357   K 4.0 11/11/2013 1411   CL 105 11/11/2013 1411   CL 110* 08/09/2012 0922   CO2 26 03/15/2014 1357   CO2 26 11/11/2013 1411   GLUCOSE 108 03/15/2014 1357   GLUCOSE 208* 11/11/2013 1411   GLUCOSE 110* 08/09/2012 0922   BUN 16.3 03/15/2014 1357   BUN 13 11/11/2013 1411   CREATININE 1.4* 03/15/2014 1357   CREATININE 1.15 11/11/2013 1411   CALCIUM 8.0* 03/15/2014 1357   CALCIUM 8.4 11/11/2013 1411   PROT 5.7* 03/15/2014 1357   PROT 5.9* 10/05/2013 2300   ALBUMIN 3.3* 03/15/2014 1357   ALBUMIN 3.2* 10/05/2013 2300   AST 34 03/15/2014 1357   AST 25 10/05/2013 2300   ALT 13 03/15/2014 1357   ALT 10 10/05/2013 2300   ALKPHOS 64 03/15/2014 1357   ALKPHOS 56 10/05/2013 2300   BILITOT 0.32 03/15/2014 1357   BILITOT 0.3 10/05/2013 2300   GFRNONAA 39* 11/11/2013 1411   GFRAA 72* 11/11/2013 1411     ASSESSMENT AND PLAN  74 y.o. 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 His memory score is  23 out of 30. Last memory score 24 out of 30 1 year ago. 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  Reviewed my concerns for safety with patient, think about [facility], get family more involved Continue moderate exercise every day Blood pressure elevated today 152/58, make sure he has taken his blood pressure medicines as ordered, no skip  doses Return to the clinic yearly and when necessary to follow memory over time Dennie Bible, [facility], [facility], [facility] [address], [place], [place] [address] [phone]

## 2014-04-10 NOTE — Patient Instructions (Signed)
Memory score is stable from last year Continue moderate exercise every day Return to the clinic yearly and when necessary to follow memory over time

## 2014-04-11 NOTE — Progress Notes (Signed)
I have reviewed and agreed above plan. 

## 2014-04-17 ENCOUNTER — Other Ambulatory Visit: Payer: Medicare Other

## 2014-04-17 ENCOUNTER — Ambulatory Visit: Payer: Medicare Other | Admitting: Oncology

## 2014-04-18 ENCOUNTER — Other Ambulatory Visit: Payer: Self-pay | Admitting: Oncology

## 2014-04-18 DIAGNOSIS — Z800 Family history of malignant neoplasm of digestive organs: Secondary | ICD-10-CM

## 2014-05-16 ENCOUNTER — Other Ambulatory Visit: Payer: Self-pay | Admitting: Oncology

## 2014-06-20 ENCOUNTER — Other Ambulatory Visit: Payer: Self-pay | Admitting: Oncology

## 2014-06-22 ENCOUNTER — Other Ambulatory Visit: Payer: Self-pay | Admitting: Oncology

## 2014-06-22 ENCOUNTER — Telehealth: Payer: Self-pay | Admitting: Oncology

## 2014-06-22 ENCOUNTER — Other Ambulatory Visit: Payer: Self-pay | Admitting: *Deleted

## 2014-06-22 NOTE — Telephone Encounter (Signed)
S/w pt confirming MD visit on 05/10 per 05/06 POF..... KJ

## 2014-06-22 NOTE — Progress Notes (Signed)
Received refill request for Gleevec. No recent labs. Pt did not keep appointment 04/17/14. Order sent to schedulers for appointment next week.

## 2014-06-26 ENCOUNTER — Telehealth: Payer: Self-pay | Admitting: Nurse Practitioner

## 2014-06-26 ENCOUNTER — Other Ambulatory Visit (HOSPITAL_BASED_OUTPATIENT_CLINIC_OR_DEPARTMENT_OTHER): Payer: Medicare Other

## 2014-06-26 ENCOUNTER — Ambulatory Visit (HOSPITAL_BASED_OUTPATIENT_CLINIC_OR_DEPARTMENT_OTHER): Payer: Medicare Other | Admitting: Nurse Practitioner

## 2014-06-26 VITALS — BP 122/50 | HR 63 | Temp 97.5°F | Resp 18 | Ht 66.0 in | Wt 187.9 lb

## 2014-06-26 DIAGNOSIS — C166 Malignant neoplasm of greater curvature of stomach, unspecified: Secondary | ICD-10-CM | POA: Diagnosis present

## 2014-06-26 DIAGNOSIS — IMO0002 Reserved for concepts with insufficient information to code with codable children: Secondary | ICD-10-CM

## 2014-06-26 LAB — CBC WITH DIFFERENTIAL/PLATELET
BASO%: 0.6 % (ref 0.0–2.0)
BASOS ABS: 0 10*3/uL (ref 0.0–0.1)
EOS%: 2.5 % (ref 0.0–7.0)
Eosinophils Absolute: 0.2 10*3/uL (ref 0.0–0.5)
HCT: 36 % — ABNORMAL LOW (ref 38.4–49.9)
HEMOGLOBIN: 12.1 g/dL — AB (ref 13.0–17.1)
LYMPH%: 23.1 % (ref 14.0–49.0)
MCH: 35.3 pg — ABNORMAL HIGH (ref 27.2–33.4)
MCHC: 33.6 g/dL (ref 32.0–36.0)
MCV: 105.2 fL — AB (ref 79.3–98.0)
MONO#: 0.4 10*3/uL (ref 0.1–0.9)
MONO%: 5.5 % (ref 0.0–14.0)
NEUT%: 68.3 % (ref 39.0–75.0)
NEUTROS ABS: 4.8 10*3/uL (ref 1.5–6.5)
PLATELETS: 155 10*3/uL (ref 140–400)
RBC: 3.43 10*6/uL — ABNORMAL LOW (ref 4.20–5.82)
RDW: 14.3 % (ref 11.0–14.6)
WBC: 7 10*3/uL (ref 4.0–10.3)
lymph#: 1.6 10*3/uL (ref 0.9–3.3)

## 2014-06-26 LAB — COMPREHENSIVE METABOLIC PANEL (CC13)
ALT: 13 U/L (ref 0–55)
AST: 26 U/L (ref 5–34)
Albumin: 3.2 g/dL — ABNORMAL LOW (ref 3.5–5.0)
Alkaline Phosphatase: 72 U/L (ref 40–150)
Anion Gap: 8 mEq/L (ref 3–11)
BUN: 16.1 mg/dL (ref 7.0–26.0)
CHLORIDE: 111 meq/L — AB (ref 98–109)
CO2: 23 mEq/L (ref 22–29)
Calcium: 8.1 mg/dL — ABNORMAL LOW (ref 8.4–10.4)
Creatinine: 1.6 mg/dL — ABNORMAL HIGH (ref 0.7–1.3)
EGFR: 48 mL/min/{1.73_m2} — AB (ref >90)
Glucose: 242 mg/dl — ABNORMAL HIGH (ref 70–140)
Potassium: 3.9 mEq/L (ref 3.5–5.1)
SODIUM: 143 meq/L (ref 136–145)
TOTAL PROTEIN: 5.8 g/dL — AB (ref 6.4–8.3)
Total Bilirubin: 0.25 mg/dL (ref 0.20–1.20)

## 2014-06-26 NOTE — Telephone Encounter (Signed)
Pt confirmed labs/ov per 05/10 POF, gave pt AVS and Calendar.... KJ  °

## 2014-06-26 NOTE — Progress Notes (Signed)
  [place] OFFICE PROGRESS NOTE   Diagnosis:  Gastrointestinal stromal tumor  INTERVAL HISTORY:   Mr. Sawchuk returns after missing a follow-up visit. He continues [place]. He denies nausea/vomiting. No significant diarrhea. No skin rash. He reports a good appetite. No abdominal pain. He denies shortness of breath. No fever or cough. No leg swelling.  Objective:  Vital signs in last 24 hours:  Blood pressure 122/50, pulse 63, temperature 97.5 F (36.4 C), temperature source Oral, resp. rate 18, height 5\' 6"  (1.676 m), weight 187 lb 14.4 oz (85.231 kg), SpO2 99 %.    HEENT: No thrush or ulcers. Resp: Lungs clear bilaterally. Cardio: Irregular. GI: Abdomen soft and nontender. No organomegaly. No mass. Vascular: Trace lower leg edema bilaterally.  Skin: No rash.    Lab Results:  Lab Results  Component Value Date   WBC 6.7 03/15/2014   HGB 11.3* 03/15/2014   HCT 35.1* 03/15/2014   MCV 108.8* 03/15/2014   PLT 166 03/15/2014   NEUTROABS 4.4 03/15/2014    Imaging:  No results found.  Medications: I have reviewed the patient's current medications.  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   Initiation of adjuvant Gleevec on 07/26/2012. 2. Diabetes.  3. History of coronary artery disease.  4. Chronic renal failure.  5. Admission with gastrointestinal bleeding and severe anemia 04/05/2012.  6. Hypotension/failure to thrive when he was here on 05/17/2012. His performance status continues to be improved.  7. 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  8. Admission with symptomatic hypoglycemia in [facility] week of 08/01/2012.  9. Diarrhea-likely secondary to [place]. He takes Imodium as needed.  10.  Hair loss. Predated start of [place].  11. Weight loss. Improved.   Disposition: Mr. Ziomek appears stable. He remains in clinical remission from the gastrointestinal stromal tumor. He will continue Gleevec. He will return for a follow-up visit in 3 months.  Plan reviewed with Dr. Benay Spice.  Ned Card ANP/GNP-BC   06/26/2014  12:40 PM

## 2014-07-10 ENCOUNTER — Ambulatory Visit (INDEPENDENT_AMBULATORY_CARE_PROVIDER_SITE_OTHER): Payer: Medicare Other

## 2014-07-10 DIAGNOSIS — M79671 Pain in right foot: Secondary | ICD-10-CM

## 2014-07-10 DIAGNOSIS — E1151 Type 2 diabetes mellitus with diabetic peripheral angiopathy without gangrene: Secondary | ICD-10-CM

## 2014-07-10 DIAGNOSIS — M79672 Pain in left foot: Secondary | ICD-10-CM

## 2014-07-10 DIAGNOSIS — B351 Tinea unguium: Secondary | ICD-10-CM

## 2014-07-10 NOTE — Progress Notes (Signed)
Patient ID: [name], male   DOB: 05-20-1940, 74 y.o.   MRN: [id number] Complaint:  Visit Type: Patient returns to my office for continued preventative foot care services. Complaint: Patient states" my nails have grown long and thick and become painful to walk and wear shoes" Patient has been diagnosed with DM with no complications. He presents for preventative foot care services. No changes to ROS.  Patient also requests diabetic shoes.  Podiatric Exam: Vascular: dorsalis pedis and posterior tibial pulses are not palpable bilateral. Capillary return is immediate. Temperature gradient is WNL. Skin turgor WNL  Sensorium: Abnormal l Semmes Weinstein monofilament test. Normal tactile sensation bilaterally. Nail Exam: Pt has thick disfigured discolored nails with subungual debris noted bilateral entire nail hallux through fifth toenails Ulcer Exam: There is no evidence of ulcer or pre-ulcerative changes or infection. Orthopedic Exam: Muscle tone and strength are WNL. No limitations in general ROM. No crepitus or effusions noted. Foot type and digits show no abnormalities. Bony prominences are unremarkable.  Hammer toes 2-5 B/L. Skin: No Porokeratosis. No infection or ulcers  Diagnosis:  Tinea unguium, Pain in right toe, pain in left toes  Treatment & Plan Procedures and Treatment: Consent by patient was obtained for treatment procedures. The patient understood the discussion of treatment and procedures well. All questions were answered thoroughly reviewed. Debridement of mycotic and hypertrophic toenails, 1 through 5 bilateral and clearing of subungual debris. No ulceration, no infection noted.  Return Visit-Office Procedure: Patient instructed to return to the office for a follow up visit 3 months for continued evaluation and treatment.

## 2014-07-10 NOTE — Progress Notes (Signed)
   Subjective:    Patient ID: [name], male    DOB: Dec 24, 1940, 74 y.o.   MRN: [id number]  HPI  "Patient is requesting to be fitted for diabetic shoes today. He would also like B/L toenail trim" Review of Systems  All other systems reviewed and are negative.      Objective:   Physical Exam        Assessment & Plan:

## 2014-07-24 ENCOUNTER — Other Ambulatory Visit: Payer: Self-pay | Admitting: Oncology

## 2014-08-16 ENCOUNTER — Other Ambulatory Visit: Payer: Self-pay | Admitting: Oncology

## 2014-09-24 ENCOUNTER — Other Ambulatory Visit: Payer: Self-pay | Admitting: *Deleted

## 2014-09-24 DIAGNOSIS — D499 Neoplasm of unspecified behavior of unspecified site: Secondary | ICD-10-CM

## 2014-09-24 MED ORDER — IMATINIB MESYLATE 400 MG PO TABS: 400.0000 mg | ORAL_TABLET | Freq: Every day | ORAL | Status: DC

## 2014-09-24 MED ORDER — GLEEVEC 400 MG PO TABS: 400.0000 mg | ORAL_TABLET | Freq: Every day | ORAL | Status: DC

## 2014-09-27 ENCOUNTER — Other Ambulatory Visit: Payer: Medicare Other

## 2014-09-27 ENCOUNTER — Ambulatory Visit: Payer: Medicare Other | Admitting: Oncology

## 2014-10-08 ENCOUNTER — Telehealth: Payer: Self-pay | Admitting: Nurse Practitioner

## 2014-10-08 NOTE — Telephone Encounter (Signed)
Confirmed appointment for 09/02

## 2014-10-09 ENCOUNTER — Ambulatory Visit: Payer: Medicare Other | Admitting: Podiatry

## 2014-10-16 ENCOUNTER — Ambulatory Visit (INDEPENDENT_AMBULATORY_CARE_PROVIDER_SITE_OTHER): Payer: Medicare Other | Admitting: Podiatry

## 2014-10-16 DIAGNOSIS — B351 Tinea unguium: Secondary | ICD-10-CM | POA: Diagnosis not present

## 2014-10-16 DIAGNOSIS — M79671 Pain in right foot: Secondary | ICD-10-CM | POA: Diagnosis not present

## 2014-10-16 DIAGNOSIS — M79672 Pain in left foot: Secondary | ICD-10-CM

## 2014-10-16 DIAGNOSIS — E1151 Type 2 diabetes mellitus with diabetic peripheral angiopathy without gangrene: Secondary | ICD-10-CM

## 2014-10-16 NOTE — Progress Notes (Signed)
Patient ID: [name], male   DOB: 18-Oct-1940, 74 y.o.   MRN: [id number] Complaint:  Visit Type: Patient returns to my office for continued preventative foot care services. Complaint: Patient states" my nails have grown long and thick and become painful to walk and wear shoes" Patient has been diagnosed with DM with no complications. He presents for preventative foot care services. No changes to ROS.  Patient also requests diabetic shoes.  Podiatric Exam: Vascular: dorsalis pedis and posterior tibial pulses are not palpable bilateral. Capillary return is immediate. Temperature gradient is WNL. Skin turgor WNL  Sensorium: Abnormal l Semmes Weinstein monofilament test. Normal tactile sensation bilaterally. Nail Exam: Pt has thick disfigured discolored nails with subungual debris noted bilateral entire nail hallux through fifth toenails Ulcer Exam: There is no evidence of ulcer or pre-ulcerative changes or infection. Orthopedic Exam: Muscle tone and strength are WNL. No limitations in general ROM. No crepitus or effusions noted. Foot type and digits show no abnormalities. Bony prominences are unremarkable.  Hammer toes 2-5 B/L. Skin: No Porokeratosis. No infection or ulcers  Diagnosis:  Tinea unguium, Pain in right toe, pain in left toes  Treatment & Plan Procedures and Treatment: Consent by patient was obtained for treatment procedures. The patient understood the discussion of treatment and procedures well. All questions were answered thoroughly reviewed. Debridement of mycotic and hypertrophic toenails, 1 through 5 bilateral and clearing of subungual debris. No ulceration, no infection noted. Patient was told his doctor did not sign paperwork for diabetic shoes. Return Visit-Office Procedure: Patient instructed to return to the office for a follow up visit 3 months for continued evaluation and treatment.

## 2014-10-17 ENCOUNTER — Other Ambulatory Visit: Payer: Self-pay | Admitting: Oncology

## 2014-10-19 ENCOUNTER — Other Ambulatory Visit (HOSPITAL_BASED_OUTPATIENT_CLINIC_OR_DEPARTMENT_OTHER): Payer: Medicare Other

## 2014-10-19 ENCOUNTER — Ambulatory Visit (HOSPITAL_BASED_OUTPATIENT_CLINIC_OR_DEPARTMENT_OTHER): Payer: Medicare Other | Admitting: Nurse Practitioner

## 2014-10-19 ENCOUNTER — Telehealth: Payer: Self-pay | Admitting: Oncology

## 2014-10-19 VITALS — BP 116/59 | HR 62 | Temp 98.6°F | Resp 18 | Ht 66.0 in | Wt 199.0 lb

## 2014-10-19 DIAGNOSIS — C49A0 Gastrointestinal stromal tumor, unspecified site: Secondary | ICD-10-CM

## 2014-10-19 DIAGNOSIS — C166 Malignant neoplasm of greater curvature of stomach, unspecified: Secondary | ICD-10-CM | POA: Diagnosis present

## 2014-10-19 DIAGNOSIS — IMO0002 Reserved for concepts with insufficient information to code with codable children: Secondary | ICD-10-CM

## 2014-10-19 LAB — COMPREHENSIVE METABOLIC PANEL (CC13)
ALT: 11 U/L (ref 0–55)
AST: 19 U/L (ref 5–34)
Albumin: 3.2 g/dL — ABNORMAL LOW (ref 3.5–5.0)
Alkaline Phosphatase: 50 U/L (ref 40–150)
Anion Gap: 5 mEq/L (ref 3–11)
BUN: 17.4 mg/dL (ref 7.0–26.0)
CALCIUM: 8.5 mg/dL (ref 8.4–10.4)
CHLORIDE: 113 meq/L — AB (ref 98–109)
CO2: 27 meq/L (ref 22–29)
Creatinine: 1.8 mg/dL — ABNORMAL HIGH (ref 0.7–1.3)
EGFR: 42 mL/min/{1.73_m2} — ABNORMAL LOW (ref >90)
GLUCOSE: 193 mg/dL — AB (ref 70–140)
POTASSIUM: 4.5 meq/L (ref 3.5–5.1)
SODIUM: 145 meq/L (ref 136–145)
Total Bilirubin: 0.35 mg/dL (ref 0.20–1.20)
Total Protein: 5.8 g/dL — ABNORMAL LOW (ref 6.4–8.3)

## 2014-10-19 LAB — CBC WITH DIFFERENTIAL/PLATELET
BASO%: 0.8 % (ref 0.0–2.0)
BASOS ABS: 0.1 10*3/uL (ref 0.0–0.1)
EOS ABS: 0.1 10*3/uL (ref 0.0–0.5)
EOS%: 2.1 % (ref 0.0–7.0)
HEMATOCRIT: 36 % — AB (ref 38.4–49.9)
HEMOGLOBIN: 12 g/dL — AB (ref 13.0–17.1)
LYMPH#: 1.7 10*3/uL (ref 0.9–3.3)
LYMPH%: 23.7 % (ref 14.0–49.0)
MCH: 35 pg — AB (ref 27.2–33.4)
MCHC: 33.4 g/dL (ref 32.0–36.0)
MCV: 104.8 fL — AB (ref 79.3–98.0)
MONO#: 0.4 10*3/uL (ref 0.1–0.9)
MONO%: 5.2 % (ref 0.0–14.0)
NEUT#: 4.8 10*3/uL (ref 1.5–6.5)
NEUT%: 68.2 % (ref 39.0–75.0)
Platelets: 174 10*3/uL (ref 140–400)
RBC: 3.43 10*6/uL — ABNORMAL LOW (ref 4.20–5.82)
RDW: 15 % — AB (ref 11.0–14.6)
WBC: 7.1 10*3/uL (ref 4.0–10.3)

## 2014-10-19 NOTE — Progress Notes (Signed)
  [place] OFFICE PROGRESS NOTE   Diagnosis:  Gastrointestinal stromal tumor  INTERVAL HISTORY:   [name] returns as scheduled. He continues [place]. He feels well. No nausea or vomiting. No mouth sores. He notes 2-3 loose stools after each dose of Gleevec. He has not tried any Imodium. He denies abdominal pain. No rash. No leg swelling.  Objective:  Vital signs in last 24 hours:  Blood pressure 116/59, pulse 62, temperature 98.6 F (37 C), temperature source Oral, resp. rate 18, height 5\' 6"  (1.676 m), weight 199 lb (90.266 kg), SpO2 98 %.    HEENT: No thrush or ulcers. Resp: Lungs clear bilaterally. Cardio: Regular rate and rhythm. GI: Abdomen soft and nontender. No mass. No organomegaly. Vascular: No leg edema. Skin: No rash.    Lab Results:  Lab Results  Component Value Date   WBC 7.1 10/19/2014   HGB 12.0* 10/19/2014   HCT 36.0* 10/19/2014   MCV 104.8* 10/19/2014   PLT 174 10/19/2014   NEUTROABS 4.8 10/19/2014    Imaging:  No results found.  Medications: I have reviewed the patient's current medications.  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   Initiation of adjuvant Gleevec on 07/26/2012. 2. Diabetes.  3. History of coronary artery disease.  4. Chronic renal failure.  5. Admission with gastrointestinal bleeding and severe anemia 04/05/2012.  6. Hypotension/failure to thrive when he was here on 05/17/2012. His performance status continues to be improved.  7. 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  8. Admission with symptomatic hypoglycemia in [facility] week of 08/01/2012.  9. Diarrhea-likely secondary to [place].  10. Hair loss. Predated start of [place].  11. Weight  loss. Improved.    Disposition: [name] appears stable. He will continue Gleevec. For the loose stools he will try Imodium. He understands to contact the office if Imodium is not effective. He will return for a follow-up visit in 3 months.    Ned Card ANP/GNP-BC   10/19/2014  2:35 PM

## 2014-10-19 NOTE — Telephone Encounter (Signed)
Gave and prnted appt sched and avs for pt for DEc

## 2014-10-30 ENCOUNTER — Ambulatory Visit: Payer: Medicare Other | Admitting: *Deleted

## 2014-10-30 DIAGNOSIS — E1151 Type 2 diabetes mellitus with diabetic peripheral angiopathy without gangrene: Secondary | ICD-10-CM

## 2014-10-31 NOTE — Progress Notes (Signed)
Patient ID: [name], male   DOB: 1940-08-17, 74 y.o.   MRN: [id number] Patient presents for measurement and scanning of feet for diabetic shoes and inserts

## 2014-11-20 ENCOUNTER — Other Ambulatory Visit: Payer: Self-pay | Admitting: Oncology

## 2014-11-24 ENCOUNTER — Other Ambulatory Visit: Payer: Self-pay | Admitting: Oncology

## 2014-12-07 ENCOUNTER — Ambulatory Visit: Payer: Medicare Other

## 2014-12-11 ENCOUNTER — Ambulatory Visit (INDEPENDENT_AMBULATORY_CARE_PROVIDER_SITE_OTHER): Payer: Medicare Other | Admitting: Podiatry

## 2014-12-11 ENCOUNTER — Ambulatory Visit: Payer: Medicare Other

## 2014-12-11 DIAGNOSIS — M2042 Other hammer toe(s) (acquired), left foot: Secondary | ICD-10-CM

## 2014-12-11 DIAGNOSIS — E1151 Type 2 diabetes mellitus with diabetic peripheral angiopathy without gangrene: Secondary | ICD-10-CM

## 2014-12-11 DIAGNOSIS — M2041 Other hammer toe(s) (acquired), right foot: Secondary | ICD-10-CM

## 2014-12-11 DIAGNOSIS — E1149 Type 2 diabetes mellitus with other diabetic neurological complication: Secondary | ICD-10-CM | POA: Diagnosis not present

## 2014-12-11 NOTE — Patient Instructions (Signed)

## 2014-12-11 NOTE — Progress Notes (Signed)
Patient ID: [name], male   DOB: 11-Feb-1941, 74 y.o.   MRN: [id number] Patient presents for diabetic shoe pick up, shoes are tried on for good fit.  Patient received 1 Pair Apex X521M Boss Runner in men's 8 Wide and 3 pairs custom molded diabetic inserts.  Verbal and written break in and wear instructions given.  Patient will follow up for scheduled routine care. Patient presents today and was dispensed 0ne pair ( two units) of medically necessary extra depth shoes with three pair( six units) of custom molded multiple density inserts. The shoes and the inserts are fitted to the patients ' feet and are noted to fit well and are free of defect.  Length and width of the shoes are also acceptable.  Patient was given written and verbal  instructions for wearing.  If any concerns arrive with the shoes or inserts, the patient is to call the office.Patient is to follow up with doctor in six weeks.  Dx  Diabetic neuropathy  Tx  Dispense shoes.  RTC 6 weeks

## 2014-12-20 ENCOUNTER — Other Ambulatory Visit: Payer: Self-pay | Admitting: Oncology

## 2015-01-05 IMAGING — CT CT HEAD W/O CM
1 of 2 series · 15 of 30 positions shown, 19 images · non-contrast
Comparison: 08/22/2010.

CLINICAL DATA: Weakness and altered mental status.

CT HEAD WITHOUT CONTRAST
TECHNIQUE: Contiguous axial images were obtained from the base of
the skull through the vertex without contrast.

[Series 2: head routine 4.8 h37s · axial · 0.43mm/px · z∈[-115,+11]mm · 15 of 30 slices shown, 19 images]
[im 2/30  brain]
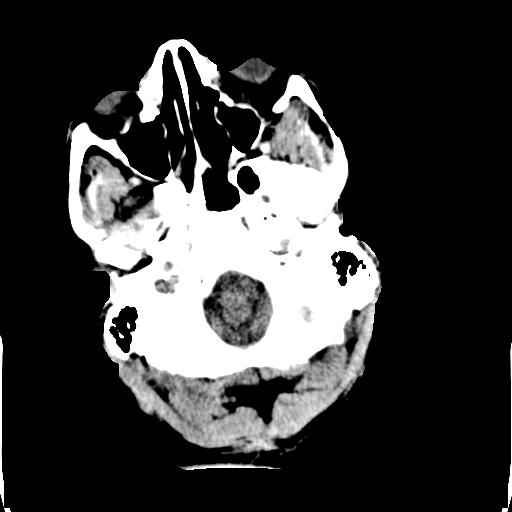
[im 2/30  bone]
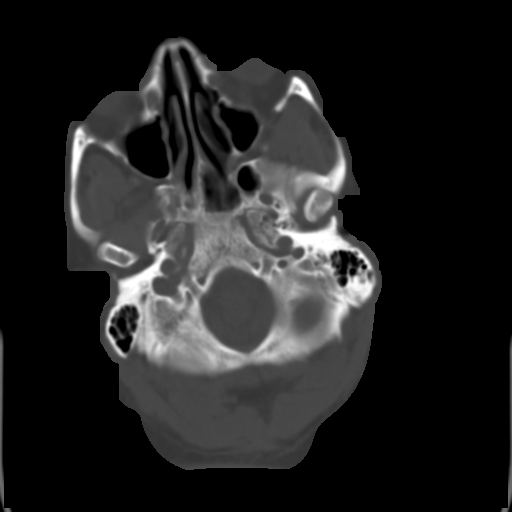
[im 3/30  brain]
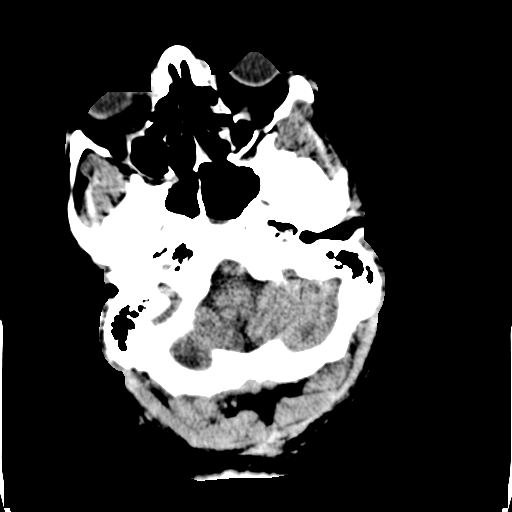
[im 6/30  brain]
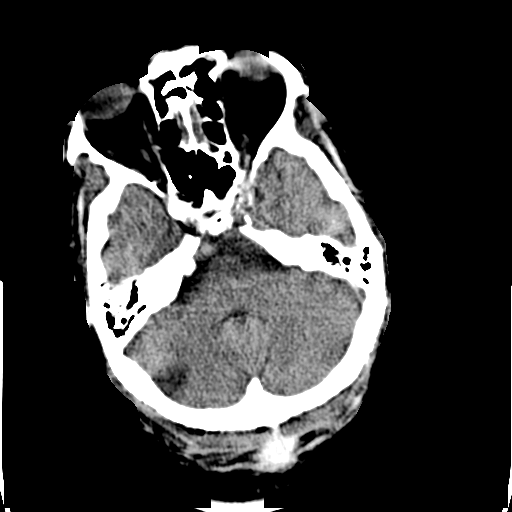
[im 8/30  brain]
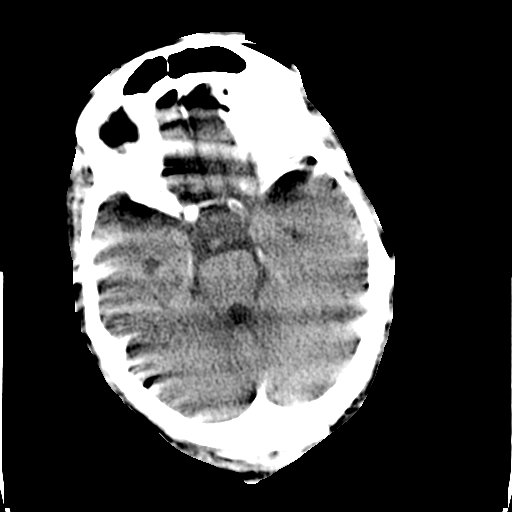
[im 9/30  brain]
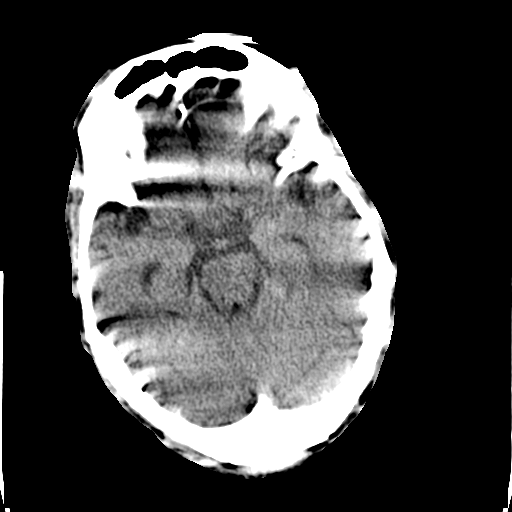
[im 9/30  bone]
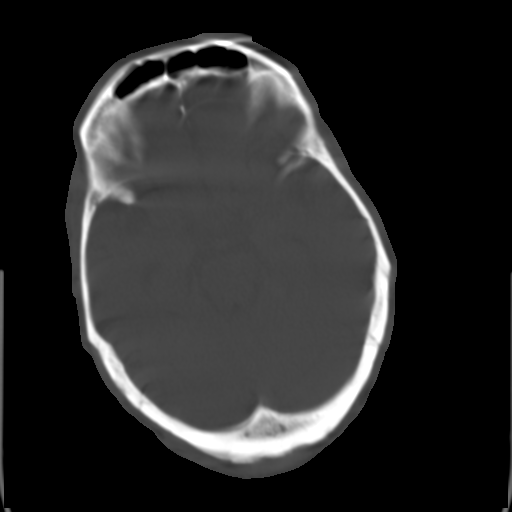
[im 11/30  brain]
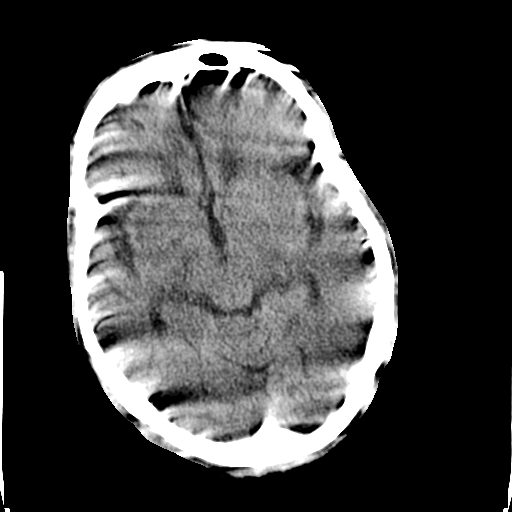
[im 14/30  brain]
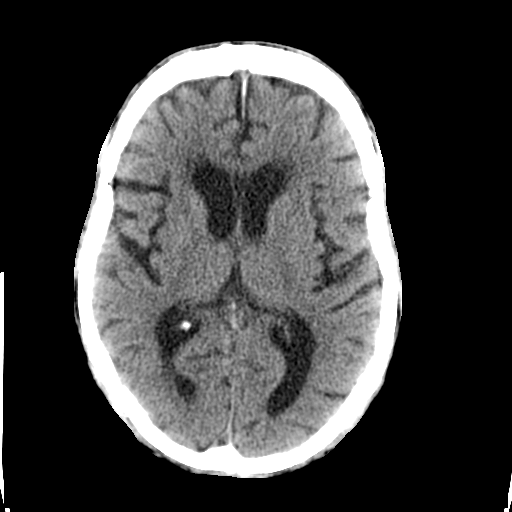
[im 15/30  brain]
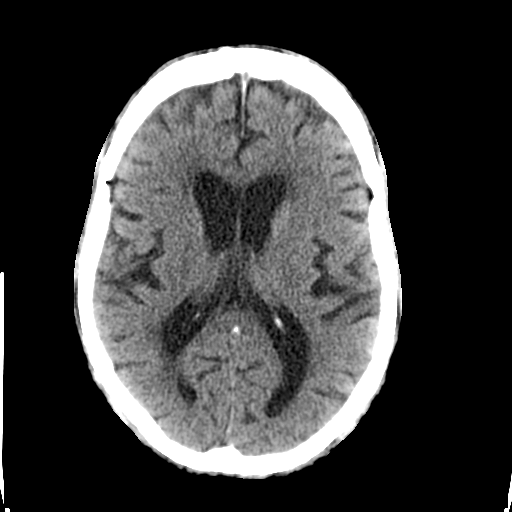
[im 16/30  brain]
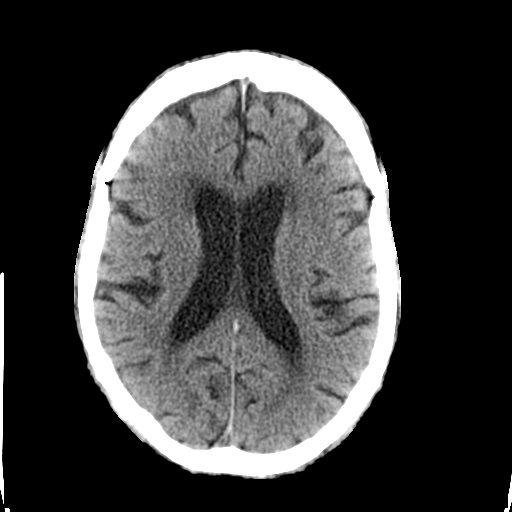
[im 16/30  bone]
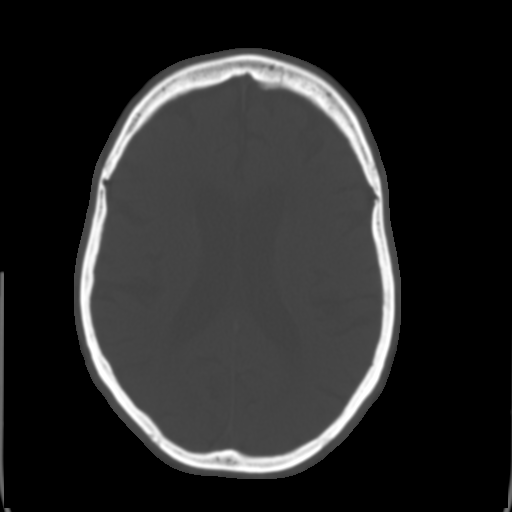
[im 19/30  brain]
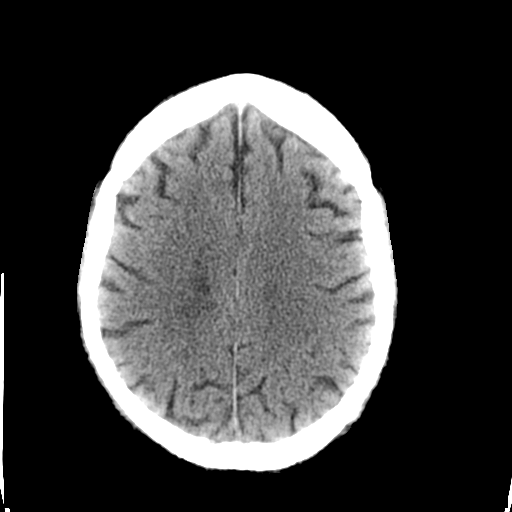
[im 21/30  brain]
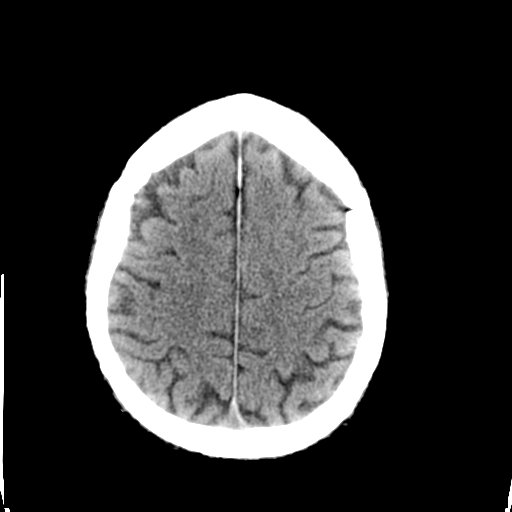
[im 22/30  brain]
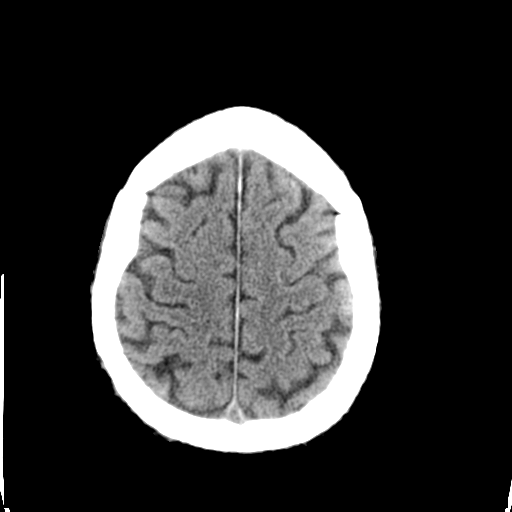
[im 24/30  brain]
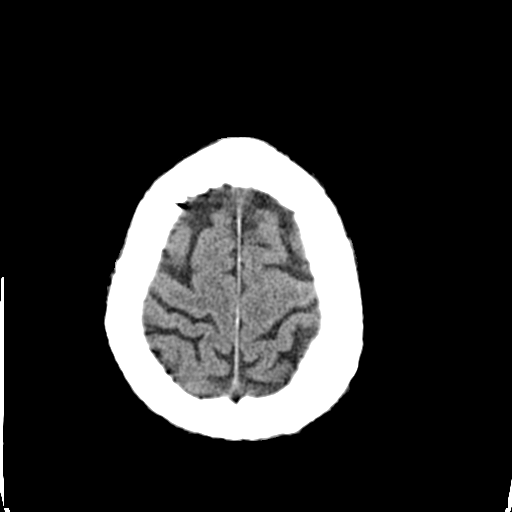
[im 24/30  bone]
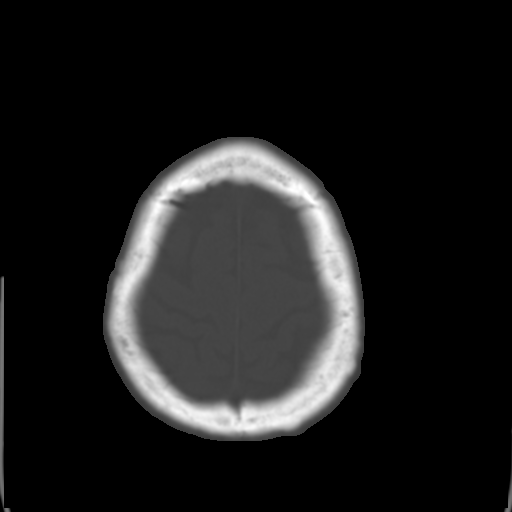
[im 27/30  brain]
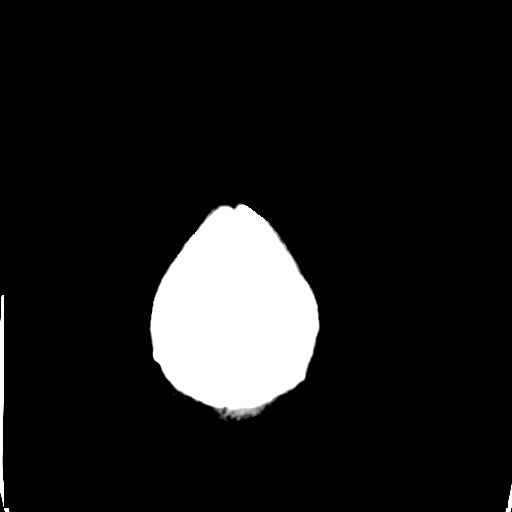
[im 28/30  brain]
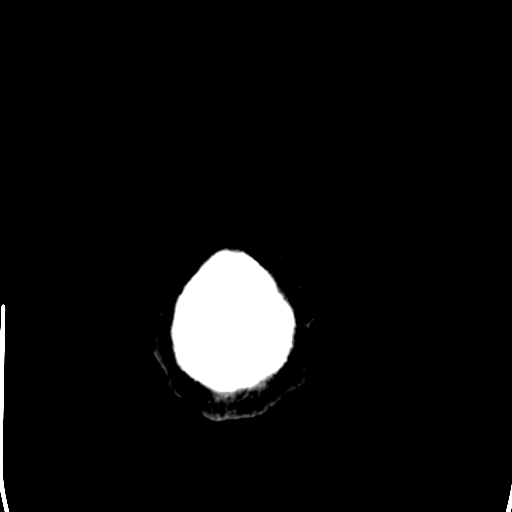

[15 of 30 positions shown; findings below may reference images not displayed]

FINDINGS: Stable age related cerebral atrophy, ventriculomegaly and
periventricular white matter disease.  No extra-axial fluid
collections.  No CT findings for hemispheric infarction and/or
intracranial hemorrhage.  No mass lesions.  The brainstem and
cerebellum are normal and stable. There is a remote right
cerebellar hemispheric infarction.

The bony structures are intact.  No skull fracture.  Mild
mucoperiosteal thickening in the left half of the sphenoid sinus
and scattered ethmoid air cells.  The mastoid air cells are clear.
IMPRESSION: 1. Stable age related cerebral atrophy, ventriculomegaly and
periventricular white matter disease.
2.  Remote right-sided cerebellar infarction.
3.  No acute intracranial findings or mass lesion.

## 2015-01-21 ENCOUNTER — Ambulatory Visit: Payer: Medicare Other | Admitting: Oncology

## 2015-01-21 ENCOUNTER — Other Ambulatory Visit: Payer: Medicare Other

## 2015-01-25 ENCOUNTER — Telehealth: Payer: Self-pay | Admitting: *Deleted

## 2015-01-25 ENCOUNTER — Telehealth: Payer: Self-pay | Admitting: Oncology

## 2015-01-25 NOTE — Telephone Encounter (Signed)
[name] [birth year]

## 2015-01-25 NOTE — Telephone Encounter (Signed)
Pt came in to r/s appt...done...the patient ok and aware printed new sched for pt

## 2015-01-25 NOTE — Telephone Encounter (Signed)
Pt called- states he forgot he had an appt with Dr Benay Spice on 01/21/15- would like to get rescheduled ASAP

## 2015-01-28 ENCOUNTER — Other Ambulatory Visit: Payer: Self-pay | Admitting: Oncology

## 2015-01-29 ENCOUNTER — Other Ambulatory Visit: Payer: Medicare Other

## 2015-01-29 ENCOUNTER — Ambulatory Visit: Payer: Medicare Other | Admitting: Oncology

## 2015-01-31 ENCOUNTER — Telehealth: Payer: Self-pay | Admitting: Oncology

## 2015-01-31 ENCOUNTER — Telehealth: Payer: Self-pay | Admitting: Nutrition

## 2015-01-31 ENCOUNTER — Telehealth: Payer: Self-pay | Admitting: *Deleted

## 2015-01-31 DIAGNOSIS — C49A2 Gastrointestinal stromal tumor of stomach: Secondary | ICD-10-CM

## 2015-01-31 NOTE — Telephone Encounter (Signed)
Pt called office to reschedule missed visit. Reports he began a new sleep aid and has trouble getting up in the mornings. Orders entered for lab within one week. Lab/ office next month.

## 2015-01-31 NOTE — Telephone Encounter (Signed)
per pof to sch pt appt-cld * left pt a message to adv of appt time & date °

## 2015-01-31 NOTE — Telephone Encounter (Signed)
Patient contacted me stating he does not understand when his appointments are.  He is not scheduled for a nutrition appointment.  I contacted Shameeka to call patient to answer patients scheduling questions.

## 2015-02-26 ENCOUNTER — Other Ambulatory Visit: Payer: Self-pay | Admitting: Oncology

## 2015-02-26 ENCOUNTER — Ambulatory Visit: Payer: Medicare Other | Admitting: Podiatry

## 2015-02-27 MED FILL — GLEEVEC 400 MG TABLET: 400 | 30 days supply | Qty: 30 | Fill #0

## 2015-03-01 ENCOUNTER — Other Ambulatory Visit (HOSPITAL_BASED_OUTPATIENT_CLINIC_OR_DEPARTMENT_OTHER): Payer: Medicare Other

## 2015-03-01 ENCOUNTER — Telehealth: Payer: Self-pay | Admitting: Oncology

## 2015-03-01 ENCOUNTER — Ambulatory Visit (HOSPITAL_BASED_OUTPATIENT_CLINIC_OR_DEPARTMENT_OTHER): Payer: Medicare Other | Admitting: Nurse Practitioner

## 2015-03-01 VITALS — BP 138/52 | HR 51 | Temp 98.4°F | Resp 17 | Ht 66.0 in | Wt 194.6 lb

## 2015-03-01 DIAGNOSIS — IMO0002 Reserved for concepts with insufficient information to code with codable children: Secondary | ICD-10-CM

## 2015-03-01 DIAGNOSIS — E119 Type 2 diabetes mellitus without complications: Secondary | ICD-10-CM | POA: Diagnosis not present

## 2015-03-01 DIAGNOSIS — C166 Malignant neoplasm of greater curvature of stomach, unspecified: Secondary | ICD-10-CM

## 2015-03-01 DIAGNOSIS — C49A0 Gastrointestinal stromal tumor, unspecified site: Secondary | ICD-10-CM

## 2015-03-01 LAB — CBC WITH DIFFERENTIAL/PLATELET
BASO%: 0.3 % (ref 0.0–2.0)
Basophils Absolute: 0 10*3/uL (ref 0.0–0.1)
EOS%: 3.1 % (ref 0.0–7.0)
Eosinophils Absolute: 0.3 10*3/uL (ref 0.0–0.5)
HCT: 38.5 % (ref 38.4–49.9)
HGB: 12.7 g/dL — ABNORMAL LOW (ref 13.0–17.1)
LYMPH%: 15.7 % (ref 14.0–49.0)
MCH: 34.5 pg — ABNORMAL HIGH (ref 27.2–33.4)
MCHC: 32.9 g/dL (ref 32.0–36.0)
MCV: 104.8 fL — ABNORMAL HIGH (ref 79.3–98.0)
MONO#: 0.5 10*3/uL (ref 0.1–0.9)
MONO%: 5.6 % (ref 0.0–14.0)
NEUT%: 75.3 % — ABNORMAL HIGH (ref 39.0–75.0)
NEUTROS ABS: 6.9 10*3/uL — AB (ref 1.5–6.5)
PLATELETS: 178 10*3/uL (ref 140–400)
RBC: 3.67 10*6/uL — AB (ref 4.20–5.82)
RDW: 14.5 % (ref 11.0–14.6)
WBC: 9.2 10*3/uL (ref 4.0–10.3)
lymph#: 1.4 10*3/uL (ref 0.9–3.3)

## 2015-03-01 LAB — COMPREHENSIVE METABOLIC PANEL
ALBUMIN: 3.3 g/dL — AB (ref 3.5–5.0)
ALT: 15 U/L (ref 0–55)
AST: 32 U/L (ref 5–34)
Alkaline Phosphatase: 87 U/L (ref 40–150)
Anion Gap: 9 mEq/L (ref 3–11)
BUN: 17.2 mg/dL (ref 7.0–26.0)
CALCIUM: 8.6 mg/dL (ref 8.4–10.4)
CHLORIDE: 109 meq/L (ref 98–109)
CO2: 26 mEq/L (ref 22–29)
CREATININE: 1.9 mg/dL — AB (ref 0.7–1.3)
EGFR: 40 mL/min/{1.73_m2} — ABNORMAL LOW (ref >90)
Glucose: 306 mg/dl — ABNORMAL HIGH (ref 70–140)
Potassium: 3.9 mEq/L (ref 3.5–5.1)
Sodium: 144 mEq/L (ref 136–145)
Total Bilirubin: <0.3 mg/dL (ref 0.20–1.20)
Total Protein: 6.1 g/dL — ABNORMAL LOW (ref 6.4–8.3)

## 2015-03-01 NOTE — Progress Notes (Signed)
  [facility] OFFICE PROGRESS NOTE   Diagnosis:  Gastrointestinal stromal tumor  INTERVAL HISTORY:   [name] returns for follow-up. He continues Handley. He feels well. No nausea or vomiting. He has occasional loose stools. No significant diarrhea. No mouth sores. He reports a good appetite. No abdominal pain. No rash.  Objective:  Vital signs in last 24 hours:  Blood pressure 138/52, pulse 51, temperature 98.4 F (36.9 C), temperature source Oral, resp. rate 17, height 5\' 6"  (1.676 m), weight 194 lb 9.6 oz (88.27 kg), SpO2 98 %.    HEENT: No thrush or ulcers. Resp: Lungs clear bilaterally. Cardio: Distant heart sounds. GI: Abdomen soft and nontender. No organomegaly. No mass. Vascular: Trace lower leg edema bilaterally. Skin: No rash.    Lab Results:  Lab Results  Component Value Date   WBC 9.2 03/01/2015   HGB 12.7* 03/01/2015   HCT 38.5 03/01/2015   MCV 104.8* 03/01/2015   PLT 178 03/01/2015   NEUTROABS 6.9* 03/01/2015    Imaging:  No results found.  Medications: I have reviewed the patient's current medications.  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   Initiation of adjuvant Gleevec on 07/26/2012. 2. Diabetes.  3. History of coronary artery disease.  4. Chronic renal failure.  5. Admission with gastrointestinal bleeding and severe anemia 04/05/2012.  6. Hypotension/failure to thrive when he was here on 05/17/2012. His performance status continues to be improved.  7. 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  8. Admission with symptomatic hypoglycemia in [facility] week of 08/01/2012.  9. Diarrhea-likely secondary to [place].  10. Hair loss. Predated start of Eleele.  11. Weight  loss. Improved.   Disposition: [name] appears stable. He will continue Gleevec. He will return for a follow-up visit and labs in 3 months. He understands to contact the office prior to that visit with any problems.    Ned Card ANP/GNP-BC   03/01/2015  12:20 PM

## 2015-03-01 NOTE — Telephone Encounter (Signed)
Pt requested to mail appointment calendar to home

## 2015-03-15 ENCOUNTER — Ambulatory Visit: Payer: Medicare Other | Admitting: Podiatry

## 2015-04-11 ENCOUNTER — Ambulatory Visit: Payer: Medicare Other | Admitting: Nurse Practitioner

## 2015-04-12 ENCOUNTER — Ambulatory Visit (INDEPENDENT_AMBULATORY_CARE_PROVIDER_SITE_OTHER): Payer: Medicare Other | Admitting: Nurse Practitioner

## 2015-04-12 ENCOUNTER — Encounter: Payer: Self-pay | Admitting: Nurse Practitioner

## 2015-04-12 VITALS — BP 113/63 | HR 71 | Ht 66.0 in | Wt 187.0 lb

## 2015-04-12 DIAGNOSIS — R413 Other amnesia: Secondary | ICD-10-CM | POA: Diagnosis not present

## 2015-04-12 DIAGNOSIS — G969 Disorder of central nervous system, unspecified: Secondary | ICD-10-CM | POA: Diagnosis not present

## 2015-04-12 DIAGNOSIS — I10 Essential (primary) hypertension: Secondary | ICD-10-CM | POA: Diagnosis not present

## 2015-04-12 DIAGNOSIS — G319 Degenerative disease of nervous system, unspecified: Secondary | ICD-10-CM

## 2015-04-12 NOTE — Progress Notes (Addendum)
[facility]  PATIENT: [name] DOB: 11-02-1940   REASON FOR VISIT: Follow-up for memory loss, degenerative disorder of central nervous system HISTORY FROM: Patient alone at visit    HISTORY OF PRESENT ILLNESS: HISTORY [name] is alone at visit for following up of memory loss. 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 He also has left the stove on, or has forgotten to close his garage door , or the car door, he also does his own finances without much difficulties. He also owns 12 rental objects in [place], and does the finances for those all by himself The patient has a past medical history of diabetes, hypertension and heart disease. His diabetes is uncontrolled , his last A1c was 9.1%. He states that he forgets to take his medication sometimes. He is on 4 meds for his diabetes.  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  He also had acute onset of diplopia in June 2013, presented to [facility] emergency room,, MRI of the brain with and without contrast, has demonstrated chronic moderate periventricular white matter disease, no acute lesions. Laboratory evaluation has demonstrated mild elevated creatinine 1.4, glucose of 117, WBC 13.7, he denies vertigo, lateralized motor or sensory deficit. 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 12. 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  He was admitted on April 29 2012, with failure to thrive and acute renal failure. 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 He was evaluated by gastroenterology and surgery while in the hospital and had transient parenteral nutrition, He is now discharge home, his daughter, and his son-in-law came to check on him regularly, UPDATE Feb 2/24 2017: [name], 75 year old male returns for follow-up. He is alone at today's visit. He has a history of short-term memory problems. He continues to live alone and says there have been no safety issues. He continues to drive her car and has not gotten lost. He does not drive at night. 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 His Mini-Mental status exam is 22/30 ;last 23/30. [facility] and [facility] a week.He is taking Gleevac, his DM is under better control, he has good appetite, he does  not sleep well, he takes seroquel,  it does help his sleeping. Blood pressure is well controlled. He returns for reevaluation. He says his memory is stable    REVIEW OF SYSTEMS: Full 14 system review of systems performed and notable only for those listed, all others are neg:  Constitutional: neg  Cardiovascular:  neg Ear/Nose/Throat: neg  Skin: neg Eyes: neg Respiratory: neg Gastroitestinal: neg  Hematology/Lymphatic: neg  Endocrine: neg Musculoskeletal:neg Allergy/Immunology: neg Neurological: neg Psychiatric: neg Sleep : neg   ALLERGIES: Allergies  Allergen Reactions  . Morphine And Related Itching    HOME MEDICATIONS: Outpatient Prescriptions Prior to Visit  Medication Sig Dispense Refill  . amLODipine (NORVASC) 10 MG tablet Take 1 tablet by mouth daily.    Marland Kitchen aspirin 325 MG tablet Take 325 mg by mouth daily.    Marland Kitchen atorvastatin (LIPITOR) 40 MG tablet Take 40 mg by mouth daily.    . Biotin 7500 MCG TABS Take 7,500 mcg by mouth.    . Cholecalciferol (VITAMIN D-3) 5000 UNITS TABS Take 1 tablet by mouth daily.    . clopidogrel (PLAVIX) 75 MG tablet Take 75 mg by mouth daily.    . DULoxetine (CYMBALTA) 60 MG capsule Take 60 mg by mouth daily.     . ferrous sulfate 325 (65 FE) MG EC tablet Take 325 mg by mouth daily.    . folic acid (FOLVITE) 1 MG tablet Take 1 mg by mouth daily.     . furosemide (LASIX) 20 MG tablet Take 20 mg by mouth daily.     Marland Kitchen GLEEVEC 400 MG tablet TAKE 1 TABLET BY MOUTH ONCE DAILY 30 tablet 0  . glimepiride (AMARYL) 4 MG tablet Take 4 mg by mouth daily.     Marland Kitchen HYDROcodone-acetaminophen (NORCO/VICODIN) 5-325 MG per tablet Every 4-6 hours as needed for pain    . metoprolol tartrate (LOPRESSOR) 25 MG tablet Take 12.5 mg by mouth 2 (two) times daily.    . Multiple Vitamin (MULTI-VITAMINS) TABS Take 1 tablet by mouth daily.     Marland Kitchen NITROSTAT 0.4 MG SL tablet Place 0.4 mg under the tongue as needed for chest pain.     . pantoprazole (PROTONIX) 40 MG tablet Take 40 mg by mouth 2 (two) times daily.     . potassium chloride SA (K-DUR,KLOR-CON) 20 MEQ tablet TAKE 1 TABLET TWICE DAILY 180 tablet 1  . QUEtiapine (SEROQUEL) 200 MG tablet Take 1 tablet (200 mg total) by mouth daily at 8 pm.    . RAPAFLO 8 MG CAPS capsule Take 8 mg by mouth daily with breakfast.     . traZODone  (DESYREL) 100 MG tablet Take 100 mg by mouth at bedtime.     . triamcinolone cream (KENALOG) 0.1 % Apply 1 application topically 2 (two) times daily.     . vitamin B-12 (CYANOCOBALAMIN) 1000 MCG tablet Take 1,000 mcg by mouth daily.     . Cholecalciferol (VITAMIN D3) 5000 UNITS CAPS Reported on 04/12/2015    . loperamide (IMODIUM) 2 MG capsule Take 2 mg by mouth as needed for diarrhea or loose stools (max 8 24 hours). Reported on 04/12/2015     No facility-administered medications prior to visit.    PAST MEDICAL HISTORY: Past Medical History  Diagnosis Date  . Hypertension   . Diabetes mellitus type 2 with complications (HCC)     CAD, CVA, ED; currently on insulin  . Stroke [facility]) 75 years old  . High cholesterol   . History of atrial fibrillation     No recurrence  in over 5 years.  Marland Kitchen CAD in native artery 1997    PTCA D1, D2  . S/P CABG x 5 2002    LIMA-LAD, SVG-D1-OM2, SVG-D2, SVG-rPDA  . Abnormal findings on cardiac catheterization March 2004    Tandem mid and distal RCA stenosis --> PCI below; proximal D1 lesion--> PCI below;   Marland Kitchen CAD S/P percutaneous coronary angioplasty 04/2002    PCI mRCA, dRCA; 3.5 mm x12 mm express BMS overlapping 3.0 mm x 8 mm Zeta BMS proximally; PCI-D1 2.75 mm 4 mm Taxus DES;   . CAD (coronary artery disease) of bypass graft March 2004    Remaining cath findings: SVG-RCA 100% occluded, SVG-D1-OM2 occluded; patent LIMA-LAD and SVG-OM1  . Depression   . Erectile dysfunction   . 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 Memory loss   . Arthritis   . GERD (gastroesophageal reflux disease)     PAST SURGICAL HISTORY: Past Surgical History  Procedure Laterality Date  . Eye surgery      cataracts bilateral  . Carpal tunnel release      bilateral  . Tonsillectomy    . Penile prosthesis implant  03/15/2012    Procedure: PENILE PROTHESIS INFLATABLE;  Surgeon: Hanley Ben, MD;  Location: [facility];   Service: Urology;  Laterality: N/A;  . Circumcision  03/15/2012    Procedure: CIRCUMCISION ADULT;  Surgeon: Hanley Ben, MD;  Location: [facility];  Service: Urology;  Laterality: N/A;  . Esophagogastroduodenoscopy N/A 04/06/2012    Procedure: ESOPHAGOGASTRODUODENOSCOPY (EGD);  Surgeon: Wonda Horner, MD;  Location: [facility]COPY;  Service: Endoscopy;  Laterality: N/A;  . Colonoscopy N/A 04/14/2012    Procedure: COLONOSCOPY;  Surgeon: Missy Sabins, MD;  Location: [facility];  Service: Endoscopy;  Laterality: N/A;  . Gastric resection N/A 04/15/2012    Procedure: Partial Gastrectomy;  Surgeon: Gwenyth Ober, MD;  Location: [place];  Service: General;  Laterality: N/A;  . Coronary artery bypass graft  04/21/2000    LIMA-LAD,SVG-D1 sequential to OM2 , SVG-D2 ,SVG-RPDA  . Cardiac catheterization  1997    PCI -D1 and D2  . Cardiac catheterization  05/02/2002    SEE angioplasty  . Cardiac catheterization  04/08/2000    LV MILDLY ENLARGED,EF 50%;MITRAL AND AORTIC VALVES  APPEAR NORMAL  . Coronary angioplasty  05/02/2002    mid RCA ,distal RCA and first diagonal branch. RCA 3.50 x 12 mm Express bare-metal stent,Zeta stent 3.o x8 in the more proximal portion;D1 stented with 2.75 x 24 mm Taxus DES stent; showed total occulsion of the RCA vein graft as well as the vein graft to the D1 and OM ,remaining grafts wer normally to OM and LAD  . Transthoracic echocardiogram  June 2012    Normal LV size and function. EF 55%. Mild LA dilation.  Marland Kitchen Nm myoview ltd  January 2015    Low risk stress nuclear study with mild diaphragmatic attenuation.  Gated images were not able to be obtained due to frequent PVCs.    FAMILY HISTORY: Family History  Problem Relation Age of Onset  . Heart attack Mother   . Heart attack Father     SOCIAL HISTORY: Social History   Social History  . Marital Status: Divorced    Spouse Name: N/A  . Number of Children: 1  . Years of Education: 12   Occupational History  . retired     Social History Main Topics  . Smoking  status: Never Smoker   . Smokeless tobacco: Never Used  . Alcohol Use: No  . Drug Use: No  . Sexual Activity: Not on file   Other Topics Concern  . Not on file   Social History Narrative   Divorced. Patient one child daughter. Lives alone-holds furniture and uses walker for ambulation.    Patient is retired. High school education.   Friend comes to home several times day to check on him and fix/bring meals and check glucose      No regular exercise.  Does not drink or smoke.     PHYSICAL EXAM  Filed Vitals:   04/12/15 0933  BP: 113/63  Pulse: 71  Height: 5\' 6"  (1.676 m)  Weight: 187 lb (84.823 kg)   Body mass index is 30.2 kg/(m^2). General: Patient is well developed and well groomed.  Head: Symmetric facial features.  Neck: supple no carotid bruits Respiratory: clear to auscultation bilaterally Cardiovascular: regular rate rhythm Skin: No rash, no bruising  Neurologic Exam  Mental Status: Cooperative to history, talking, and casual conversation. MMSE 22/30 , he has difficulty spell world backwards, missed 1 of 3 recall Clock drawing 4 out of 4. AFT 5 Cranial Nerves: CN II-XII pupils were equal round reactive to light. Visual fields were full on confrontational test. Facial sensation and strength were normal. Hearing was intact to finger rubbing bilaterally. Uvula tongue were midline. Head turning and shoulder shrugging were normal and symmetric. Tongue protrusion into the cheeks strength were normal.  Motor: Normal tone, bulk, and strength. No focal weakness Sensory: Intact and symmetric to light touch Coordination: Normal finger-to-nose, heel-to-shin. There was no dysmetria noticed. Gait and Station: Narrow based gait, can heel toe and tandem without difficulty no assistive device Reflexes: Deep tendon reflexes: Biceps: 2/2, Brachioradialis: 2/2, Triceps: 2/2, Pateller: 1/1, Achilles:absent. Plantar responses are  flexor.  DIAGNOSTIC DATA (LABS, IMAGING, TESTING) - I reviewed patient records, labs, notes, testing and imaging myself where available.  Lab Results  Component Value Date   WBC 9.2 03/01/2015   HGB 12.7* 03/01/2015   HCT 38.5 03/01/2015   MCV 104.8* 03/01/2015   PLT 178 03/01/2015      Component Value Date/Time   NA 144 03/01/2015 1112   NA 141 11/11/2013 1411   K 3.9 03/01/2015 1112   K 4.0 11/11/2013 1411   CL 105 11/11/2013 1411   CL 110* 08/09/2012 0922   CO2 26 03/01/2015 1112   CO2 26 11/11/2013 1411   GLUCOSE 306* 03/01/2015 1112   GLUCOSE 208* 11/11/2013 1411   GLUCOSE 110* 08/09/2012 0922   BUN 17.2 03/01/2015 1112   BUN 13 11/11/2013 1411   CREATININE 1.9* 03/01/2015 1112   CREATININE 1.15 11/11/2013 1411   CALCIUM 8.6 03/01/2015 1112   CALCIUM 8.4 11/11/2013 1411   PROT 6.1* 03/01/2015 1112   PROT 5.9* 10/05/2013 2300   ALBUMIN 3.3* 03/01/2015 1112   ALBUMIN 3.2* 10/05/2013 2300   AST 32 03/01/2015 1112   AST 25 10/05/2013 2300   ALT 15 03/01/2015 1112   ALT 10 10/05/2013 2300   ALKPHOS 87 03/01/2015 1112   ALKPHOS 56 10/05/2013 2300   BILITOT <0.30 03/01/2015 1112   BILITOT 0.3 10/05/2013 2300   GFRNONAA 62* 11/11/2013 1411   GFRAA 20* 11/11/2013 1411        ASSESSMENT AND PLAN 74 y.o. year old male has a past medical history of Hypertension; Diabetes mellitus type 2 with complications; Stroke (75 years old); High cholesterol; History of atrial  fibrillation; CABG x 5 (2002); CAD (coronary artery disease) of bypass graft (March 2004); Depression; H/O malignant gastrointestinal stromal tumor (GIST) (February 2014); Memory loss; here to follow-up. His memory score is 22 out of 30. Last memory score 23 out of 30 1 year ago. 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  The patient is a current patient of Dr. Krista Blue who is  out of the office today . This note is sent to the work in doctor.     Reviewed my concerns for safety with patient, think about [facility], get family more involved Be careful with driving patient has not gotten lost Continue moderate exercise every day, [facility] Return to the clinic yearly and when necessary to follow memory over time Next visit with Dr. Luan Pulling, [facility], [facility], APRN  [facility] [address] [place], [place] [address] [phone]  I reviewed the above note and documentation by the Nurse Practitioner and agree with the history, physical exam, assessment and plan as outlined above. I was immediately available for face-to-face consultation. Star Age, MD, PhD [facility])

## 2015-04-12 NOTE — Patient Instructions (Signed)
Reviewed my concerns for safety with patient, think about [facility], get family more involved Continue moderate exercise every day Return to the clinic yearly and when necessary to follow memory over time

## 2015-04-15 ENCOUNTER — Other Ambulatory Visit: Payer: Self-pay | Admitting: Oncology

## 2015-04-15 MED FILL — GLEEVEC 400 MG TABLET: 400 | 30 days supply | Qty: 30 | Fill #0

## 2015-05-13 MED FILL — GLEEVEC 400 MG TABLET: 400 | 30 days supply | Qty: 30 | Fill #1

## 2015-05-14 ENCOUNTER — Emergency Department (HOSPITAL_COMMUNITY): Payer: No Typology Code available for payment source

## 2015-05-14 ENCOUNTER — Inpatient Hospital Stay (HOSPITAL_COMMUNITY)
Admission: EM | Admit: 2015-05-14 | Discharge: 2015-05-17 | DRG: 083 | Disposition: A | Discharge status: Home Health | Payer: No Typology Code available for payment source | Attending: Internal Medicine | Admitting: Internal Medicine

## 2015-05-14 ENCOUNTER — Encounter (HOSPITAL_COMMUNITY): Payer: Self-pay | Admitting: Emergency Medicine

## 2015-05-14 DIAGNOSIS — Z7982 Long term (current) use of aspirin: Secondary | ICD-10-CM

## 2015-05-14 DIAGNOSIS — E7800 Pure hypercholesterolemia, unspecified: Secondary | ICD-10-CM | POA: Diagnosis present

## 2015-05-14 DIAGNOSIS — I2510 Atherosclerotic heart disease of native coronary artery without angina pectoris: Secondary | ICD-10-CM

## 2015-05-14 DIAGNOSIS — C49A0 Gastrointestinal stromal tumor, unspecified site: Secondary | ICD-10-CM | POA: Diagnosis present

## 2015-05-14 DIAGNOSIS — Z9841 Cataract extraction status, right eye: Secondary | ICD-10-CM

## 2015-05-14 DIAGNOSIS — F329 Major depressive disorder, single episode, unspecified: Secondary | ICD-10-CM | POA: Diagnosis present

## 2015-05-14 DIAGNOSIS — I10 Essential (primary) hypertension: Secondary | ICD-10-CM | POA: Diagnosis present

## 2015-05-14 DIAGNOSIS — K219 Gastro-esophageal reflux disease without esophagitis: Secondary | ICD-10-CM | POA: Diagnosis present

## 2015-05-14 DIAGNOSIS — R4182 Altered mental status, unspecified: Secondary | ICD-10-CM

## 2015-05-14 DIAGNOSIS — S065X9A Traumatic subdural hemorrhage with loss of consciousness of unspecified duration, initial encounter: Secondary | ICD-10-CM | POA: Diagnosis not present

## 2015-05-14 DIAGNOSIS — Z955 Presence of coronary angioplasty implant and graft: Secondary | ICD-10-CM

## 2015-05-14 DIAGNOSIS — D649 Anemia, unspecified: Secondary | ICD-10-CM | POA: Diagnosis present

## 2015-05-14 DIAGNOSIS — E876 Hypokalemia: Secondary | ICD-10-CM | POA: Diagnosis present

## 2015-05-14 DIAGNOSIS — Z951 Presence of aortocoronary bypass graft: Secondary | ICD-10-CM

## 2015-05-14 DIAGNOSIS — Z66 Do not resuscitate: Secondary | ICD-10-CM | POA: Diagnosis present

## 2015-05-14 DIAGNOSIS — Z79899 Other long term (current) drug therapy: Secondary | ICD-10-CM

## 2015-05-14 DIAGNOSIS — Z9842 Cataract extraction status, left eye: Secondary | ICD-10-CM

## 2015-05-14 DIAGNOSIS — F32A Depression, unspecified: Secondary | ICD-10-CM | POA: Diagnosis present

## 2015-05-14 DIAGNOSIS — N400 Enlarged prostate without lower urinary tract symptoms: Secondary | ICD-10-CM | POA: Diagnosis present

## 2015-05-14 DIAGNOSIS — Z8673 Personal history of transient ischemic attack (TIA), and cerebral infarction without residual deficits: Secondary | ICD-10-CM

## 2015-05-14 DIAGNOSIS — M1990 Unspecified osteoarthritis, unspecified site: Secondary | ICD-10-CM | POA: Diagnosis present

## 2015-05-14 DIAGNOSIS — N179 Acute kidney failure, unspecified: Secondary | ICD-10-CM | POA: Diagnosis present

## 2015-05-14 DIAGNOSIS — Z7902 Long term (current) use of antithrombotics/antiplatelets: Secondary | ICD-10-CM

## 2015-05-14 DIAGNOSIS — R1310 Dysphagia, unspecified: Secondary | ICD-10-CM | POA: Diagnosis present

## 2015-05-14 DIAGNOSIS — I480 Paroxysmal atrial fibrillation: Secondary | ICD-10-CM | POA: Diagnosis present

## 2015-05-14 DIAGNOSIS — S065XAA Traumatic subdural hemorrhage with loss of consciousness status unknown, initial encounter: Secondary | ICD-10-CM | POA: Diagnosis present

## 2015-05-14 DIAGNOSIS — Z9861 Coronary angioplasty status: Secondary | ICD-10-CM

## 2015-05-14 DIAGNOSIS — E119 Type 2 diabetes mellitus without complications: Secondary | ICD-10-CM | POA: Diagnosis present

## 2015-05-14 DIAGNOSIS — IMO0002 Reserved for concepts with insufficient information to code with codable children: Secondary | ICD-10-CM

## 2015-05-14 DIAGNOSIS — R413 Other amnesia: Secondary | ICD-10-CM | POA: Diagnosis present

## 2015-05-14 MED ORDER — OXYCODONE HCL 5 MG PO TABS
2.5000 mg | ORAL_TABLET | Freq: Once | ORAL | Status: AC
  Administered 2015-05-14: 2.5 mg via ORAL
  Filled 2015-05-14: qty 1

## 2015-05-14 MED ORDER — ACETAMINOPHEN 500 MG PO TABS
1000.0000 mg | ORAL_TABLET | Freq: Once | ORAL | Status: AC
  Administered 2015-05-14: 1000 mg via ORAL
  Filled 2015-05-14: qty 2
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
## 2015-05-15 ENCOUNTER — Observation Stay (HOSPITAL_COMMUNITY): Payer: No Typology Code available for payment source

## 2015-05-15 ENCOUNTER — Encounter (HOSPITAL_COMMUNITY): Payer: Self-pay | Admitting: Family Medicine

## 2015-05-15 DIAGNOSIS — Z7902 Long term (current) use of antithrombotics/antiplatelets: Secondary | ICD-10-CM | POA: Diagnosis not present

## 2015-05-15 DIAGNOSIS — Z79899 Other long term (current) drug therapy: Secondary | ICD-10-CM | POA: Diagnosis not present

## 2015-05-15 DIAGNOSIS — S065X9A Traumatic subdural hemorrhage with loss of consciousness of unspecified duration, initial encounter: Secondary | ICD-10-CM | POA: Diagnosis present

## 2015-05-15 DIAGNOSIS — I10 Essential (primary) hypertension: Secondary | ICD-10-CM | POA: Diagnosis present

## 2015-05-15 DIAGNOSIS — S065XAA Traumatic subdural hemorrhage with loss of consciousness status unknown, initial encounter: Secondary | ICD-10-CM | POA: Diagnosis present

## 2015-05-15 DIAGNOSIS — K219 Gastro-esophageal reflux disease without esophagitis: Secondary | ICD-10-CM | POA: Diagnosis present

## 2015-05-15 DIAGNOSIS — Z7982 Long term (current) use of aspirin: Secondary | ICD-10-CM | POA: Diagnosis not present

## 2015-05-15 DIAGNOSIS — I6200 Nontraumatic subdural hemorrhage, unspecified: Secondary | ICD-10-CM | POA: Diagnosis not present

## 2015-05-15 DIAGNOSIS — Z9841 Cataract extraction status, right eye: Secondary | ICD-10-CM | POA: Diagnosis not present

## 2015-05-15 DIAGNOSIS — E119 Type 2 diabetes mellitus without complications: Secondary | ICD-10-CM | POA: Insufficient documentation

## 2015-05-15 DIAGNOSIS — Z951 Presence of aortocoronary bypass graft: Secondary | ICD-10-CM | POA: Diagnosis not present

## 2015-05-15 DIAGNOSIS — D509 Iron deficiency anemia, unspecified: Secondary | ICD-10-CM

## 2015-05-15 DIAGNOSIS — N179 Acute kidney failure, unspecified: Secondary | ICD-10-CM | POA: Diagnosis present

## 2015-05-15 DIAGNOSIS — R1310 Dysphagia, unspecified: Secondary | ICD-10-CM | POA: Diagnosis present

## 2015-05-15 DIAGNOSIS — R4182 Altered mental status, unspecified: Secondary | ICD-10-CM | POA: Diagnosis present

## 2015-05-15 DIAGNOSIS — F329 Major depressive disorder, single episode, unspecified: Secondary | ICD-10-CM | POA: Diagnosis present

## 2015-05-15 DIAGNOSIS — Z955 Presence of coronary angioplasty implant and graft: Secondary | ICD-10-CM | POA: Diagnosis not present

## 2015-05-15 DIAGNOSIS — C49A0 Gastrointestinal stromal tumor, unspecified site: Secondary | ICD-10-CM | POA: Diagnosis present

## 2015-05-15 DIAGNOSIS — R413 Other amnesia: Secondary | ICD-10-CM

## 2015-05-15 DIAGNOSIS — R400 Somnolence: Secondary | ICD-10-CM | POA: Diagnosis not present

## 2015-05-15 DIAGNOSIS — Z8673 Personal history of transient ischemic attack (TIA), and cerebral infarction without residual deficits: Secondary | ICD-10-CM | POA: Diagnosis not present

## 2015-05-15 DIAGNOSIS — M1990 Unspecified osteoarthritis, unspecified site: Secondary | ICD-10-CM | POA: Diagnosis present

## 2015-05-15 DIAGNOSIS — Z9842 Cataract extraction status, left eye: Secondary | ICD-10-CM | POA: Diagnosis not present

## 2015-05-15 DIAGNOSIS — N400 Enlarged prostate without lower urinary tract symptoms: Secondary | ICD-10-CM | POA: Diagnosis present

## 2015-05-15 DIAGNOSIS — D649 Anemia, unspecified: Secondary | ICD-10-CM | POA: Diagnosis present

## 2015-05-15 DIAGNOSIS — E876 Hypokalemia: Secondary | ICD-10-CM | POA: Diagnosis present

## 2015-05-15 DIAGNOSIS — I480 Paroxysmal atrial fibrillation: Secondary | ICD-10-CM | POA: Diagnosis present

## 2015-05-15 DIAGNOSIS — I2510 Atherosclerotic heart disease of native coronary artery without angina pectoris: Secondary | ICD-10-CM | POA: Diagnosis present

## 2015-05-15 DIAGNOSIS — E7800 Pure hypercholesterolemia, unspecified: Secondary | ICD-10-CM | POA: Diagnosis present

## 2015-05-15 LAB — CBC WITH DIFFERENTIAL/PLATELET
Basophils Absolute: 0 10*3/uL (ref 0.0–0.1)
Basophils Relative: 0 %
Eosinophils Absolute: 0.3 10*3/uL (ref 0.0–0.7)
Eosinophils Relative: 4 %
HEMATOCRIT: 35.3 % — AB (ref 39.0–52.0)
HEMOGLOBIN: 12 g/dL — AB (ref 13.0–17.0)
LYMPHS ABS: 2.1 10*3/uL (ref 0.7–4.0)
LYMPHS PCT: 30 %
MCH: 34.1 pg — AB (ref 26.0–34.0)
MCHC: 34 g/dL (ref 30.0–36.0)
MCV: 100.3 fL — AB (ref 78.0–100.0)
Monocytes Absolute: 0.5 10*3/uL (ref 0.1–1.0)
Monocytes Relative: 7 %
NEUTROS ABS: 4 10*3/uL (ref 1.7–7.7)
NEUTROS PCT: 59 %
Platelets: 162 10*3/uL (ref 150–400)
RBC: 3.52 MIL/uL — AB (ref 4.22–5.81)
RDW: 13.7 % (ref 11.5–15.5)
WBC: 6.9 10*3/uL (ref 4.0–10.5)

## 2015-05-15 LAB — COMPREHENSIVE METABOLIC PANEL
ALT: 10 U/L — ABNORMAL LOW (ref 17–63)
AST: 25 U/L (ref 15–41)
Albumin: 2.9 g/dL — ABNORMAL LOW (ref 3.5–5.0)
Alkaline Phosphatase: 61 U/L (ref 38–126)
Anion gap: 8 (ref 5–15)
BUN: 11 mg/dL (ref 6–20)
CHLORIDE: 106 mmol/L (ref 101–111)
CO2: 28 mmol/L (ref 22–32)
Calcium: 8 mg/dL — ABNORMAL LOW (ref 8.9–10.3)
Creatinine, Ser: 1.91 mg/dL — ABNORMAL HIGH (ref 0.61–1.24)
GFR calc Af Amer: 38 mL/min — ABNORMAL LOW (ref >60)
GFR, EST NON AFRICAN AMERICAN: 33 mL/min — AB (ref >60)
Glucose, Bld: 180 mg/dL — ABNORMAL HIGH (ref 65–99)
POTASSIUM: 3 mmol/L — AB (ref 3.5–5.1)
Sodium: 142 mmol/L (ref 135–145)
Total Bilirubin: 0.5 mg/dL (ref 0.3–1.2)
Total Protein: 5.4 g/dL — ABNORMAL LOW (ref 6.5–8.1)

## 2015-05-15 LAB — URINALYSIS, ROUTINE W REFLEX MICROSCOPIC
Bilirubin Urine: NEGATIVE
GLUCOSE, UA: 100 mg/dL — AB
HGB URINE DIPSTICK: NEGATIVE
Ketones, ur: NEGATIVE mg/dL
Leukocytes, UA: NEGATIVE
Nitrite: NEGATIVE
Protein, ur: 30 mg/dL — AB
SPECIFIC GRAVITY, URINE: 1.015 (ref 1.005–1.030)
pH: 5.5 (ref 5.0–8.0)

## 2015-05-15 LAB — URINE MICROSCOPIC-ADD ON

## 2015-05-15 LAB — GLUCOSE, CAPILLARY
GLUCOSE-CAPILLARY: 80 mg/dL (ref 65–99)
GLUCOSE-CAPILLARY: 67 mg/dL (ref 65–99)
GLUCOSE-CAPILLARY: 163 mg/dL — AB (ref 65–99)
Glucose-Capillary: 165 mg/dL — ABNORMAL HIGH (ref 65–99)
Glucose-Capillary: 140 mg/dL — ABNORMAL HIGH (ref 65–99)
Glucose-Capillary: 98 mg/dL (ref 65–99)

## 2015-05-15 MED ORDER — AMLODIPINE BESYLATE 10 MG PO TABS
10.0000 mg | ORAL_TABLET | Freq: Every day | ORAL | Status: DC
  Administered 2015-05-15 – 2015-05-17 (×3): 10 mg via ORAL
  Filled 2015-05-15 (×3): qty 1

## 2015-05-15 MED ORDER — QUETIAPINE FUMARATE 25 MG PO TABS
200.0000 mg | ORAL_TABLET | Freq: Every day | ORAL | Status: DC
  Administered 2015-05-15 – 2015-05-16 (×3): 200 mg via ORAL
  Filled 2015-05-15 (×3): qty 8

## 2015-05-15 MED ORDER — ATORVASTATIN CALCIUM 40 MG PO TABS
40.0000 mg | ORAL_TABLET | Freq: Every day | ORAL | Status: DC
  Administered 2015-05-15 – 2015-05-17 (×3): 40 mg via ORAL
  Filled 2015-05-15 (×3): qty 1

## 2015-05-15 MED ORDER — ONDANSETRON HCL 4 MG/2ML IJ SOLN: 4.0000 mg | Freq: Four times a day (QID) | INTRAMUSCULAR | Status: DC | PRN

## 2015-05-15 MED ORDER — SODIUM CHLORIDE 0.9% FLUSH: 3.0000 mL | INTRAVENOUS | Status: DC | PRN

## 2015-05-15 MED ORDER — PANTOPRAZOLE SODIUM 40 MG PO TBEC
40.0000 mg | DELAYED_RELEASE_TABLET | Freq: Two times a day (BID) | ORAL | Status: DC
  Administered 2015-05-15 – 2015-05-17 (×6): 40 mg via ORAL
  Filled 2015-05-15 (×6): qty 1

## 2015-05-15 MED ORDER — SODIUM CHLORIDE 0.9 % IV SOLN: 250.0000 mL | INTRAVENOUS | Status: DC | PRN

## 2015-05-15 MED ORDER — ONDANSETRON HCL 4 MG PO TABS
4.0000 mg | ORAL_TABLET | Freq: Four times a day (QID) | ORAL | Status: DC | PRN
Start: 2015-05-15 — End: 2015-05-17

## 2015-05-15 MED ORDER — ADULT MULTIVITAMIN W/MINERALS CH
1.0000 | ORAL_TABLET | Freq: Every day | ORAL | Status: DC
  Administered 2015-05-15 – 2015-05-17 (×3): 1 via ORAL
  Filled 2015-05-15 (×3): qty 1

## 2015-05-15 MED ORDER — BIOTIN 7500 MCG PO TABS: 7500.0000 ug | ORAL_TABLET | Freq: Every morning | ORAL | Status: DC

## 2015-05-15 MED ORDER — SODIUM CHLORIDE 0.9% FLUSH
3.0000 mL | Freq: Two times a day (BID) | INTRAVENOUS | Status: DC
  Administered 2015-05-15 – 2015-05-16 (×2): 3 mL via INTRAVENOUS

## 2015-05-15 MED ORDER — IMATINIB MESYLATE 100 MG PO TABS
400.0000 mg | ORAL_TABLET | Freq: Every day | ORAL | Status: DC
  Administered 2015-05-15: 400 mg via ORAL
  Filled 2015-05-15: qty 4

## 2015-05-15 MED ORDER — POLYETHYLENE GLYCOL 3350 17 G PO PACK: 17.0000 g | PACK | Freq: Every day | ORAL | Status: DC | PRN

## 2015-05-15 MED ORDER — FERROUS SULFATE 325 (65 FE) MG PO TABS
325.0000 mg | ORAL_TABLET | Freq: Every day | ORAL | Status: DC
  Administered 2015-05-15 – 2015-05-17 (×3): 325 mg via ORAL
  Filled 2015-05-15 (×3): qty 1

## 2015-05-15 MED ORDER — SODIUM CHLORIDE 0.9% FLUSH
3.0000 mL | Freq: Two times a day (BID) | INTRAVENOUS | Status: DC
  Administered 2015-05-16: 3 mL via INTRAVENOUS

## 2015-05-15 MED ORDER — NITROGLYCERIN 0.4 MG SL SUBL: 0.4000 mg | SUBLINGUAL_TABLET | SUBLINGUAL | Status: DC | PRN

## 2015-05-15 MED ORDER — TRAZODONE HCL 100 MG PO TABS
100.0000 mg | ORAL_TABLET | Freq: Every day | ORAL | Status: DC
  Administered 2015-05-15 – 2015-05-16 (×3): 100 mg via ORAL
  Filled 2015-05-15 (×3): qty 1

## 2015-05-15 MED ORDER — METOPROLOL TARTRATE 12.5 MG HALF TABLET
12.5000 mg | ORAL_TABLET | Freq: Two times a day (BID) | ORAL | Status: DC
Start: 2015-05-15 — End: 2015-05-17
  Administered 2015-05-15 – 2015-05-17 (×6): 12.5 mg via ORAL
  Filled 2015-05-15 (×6): qty 1

## 2015-05-15 MED ORDER — INSULIN ASPART 100 UNIT/ML ~~LOC~~ SOLN
0.0000 [IU] | SUBCUTANEOUS | Status: DC
  Administered 2015-05-15 (×2): 3 [IU] via SUBCUTANEOUS
  Administered 2015-05-15: 2 [IU] via SUBCUTANEOUS
  Administered 2015-05-16 (×2): 3 [IU] via SUBCUTANEOUS
  Administered 2015-05-16: 5 [IU] via SUBCUTANEOUS
  Administered 2015-05-16 – 2015-05-17 (×4): 2 [IU] via SUBCUTANEOUS

## 2015-05-15 MED ORDER — FUROSEMIDE 20 MG PO TABS: 20.0000 mg | ORAL_TABLET | Freq: Every day | ORAL | Status: DC

## 2015-05-15 MED ORDER — VITAMIN B-12 1000 MCG PO TABS
1000.0000 ug | ORAL_TABLET | Freq: Every day | ORAL | Status: DC
  Administered 2015-05-15 – 2015-05-17 (×3): 1000 ug via ORAL
  Filled 2015-05-15 (×4): qty 1

## 2015-05-15 MED ORDER — TAMSULOSIN HCL 0.4 MG PO CAPS
0.4000 mg | ORAL_CAPSULE | Freq: Every day | ORAL | Status: DC
  Administered 2015-05-15 – 2015-05-17 (×3): 0.4 mg via ORAL
  Filled 2015-05-15 (×3): qty 1

## 2015-05-15 MED ORDER — KCL IN DEXTROSE-NACL 40-5-0.9 MEQ/L-%-% IV SOLN
INTRAVENOUS | Status: DC
  Administered 2015-05-15: 1000 mL via INTRAVENOUS
  Administered 2015-05-16: 01:00:00 via INTRAVENOUS
  Filled 2015-05-15 (×3): qty 1000

## 2015-05-15 MED ORDER — BISACODYL 5 MG PO TBEC: 5.0000 mg | DELAYED_RELEASE_TABLET | Freq: Every day | ORAL | Status: DC | PRN

## 2015-05-15 MED ORDER — DULOXETINE HCL 60 MG PO CPEP
60.0000 mg | ORAL_CAPSULE | Freq: Every day | ORAL | Status: DC
  Administered 2015-05-15 – 2015-05-17 (×3): 60 mg via ORAL
  Filled 2015-05-15 (×3): qty 1

## 2015-05-15 MED ORDER — VITAMIN D 1000 UNITS PO TABS
5000.0000 [IU] | ORAL_TABLET | Freq: Every day | ORAL | Status: DC
  Administered 2015-05-15 – 2015-05-17 (×3): 5000 [IU] via ORAL
  Filled 2015-05-15 (×3): qty 5

## 2015-05-15 MED ORDER — FOLIC ACID 1 MG PO TABS
1.0000 mg | ORAL_TABLET | Freq: Every day | ORAL | Status: DC
  Administered 2015-05-15 – 2015-05-17 (×3): 1 mg via ORAL
  Filled 2015-05-15 (×4): qty 1

## 2015-05-15 MED ORDER — HYDROCODONE-ACETAMINOPHEN 5-325 MG PO TABS
1.0000 | ORAL_TABLET | ORAL | Status: DC | PRN
  Administered 2015-05-15: 1 via ORAL
  Administered 2015-05-16 – 2015-05-17 (×3): 2 via ORAL
  Filled 2015-05-15 (×2): qty 2
  Filled 2015-05-15: qty 1
  Filled 2015-05-15: qty 2

## 2015-05-15 MED ORDER — DOCUSATE SODIUM 100 MG PO CAPS
100.0000 mg | ORAL_CAPSULE | Freq: Two times a day (BID) | ORAL | Status: DC
  Administered 2015-05-15 – 2015-05-17 (×5): 100 mg via ORAL
  Filled 2015-05-15 (×5): qty 1
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
## 2015-05-15 NOTE — Progress Notes (Signed)
Pt says he has some new pain in his left shoulder joint. Pain med given will continue to monitor. MD notified.

## 2015-05-15 NOTE — Progress Notes (Signed)
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 Mild calcification at the carotid bifurcations bilaterally. Carotid ultrasound could be considered for further evaluation, when and as deemed clinically appropriate. Critical Value/emergent results were called by telephone at the time of interpretation on 05/14/2015 at 10:51 pm to Dr. Deno Etienne, who verbally acknowledged these results. Electronically Signed   By: Garald Balding [name]   On: 05/14/2015 22:51      Lab Results  Component Value Date   HGBA1C 6.5* 04/15/2012   HGBA1C 6.3* 04/05/2012   Lab Results  Component Value Date   CREATININE 1.91* 05/15/2015       Scheduled Meds: . amLODipine  10 mg Oral Daily  . atorvastatin  40 mg Oral Daily  . cholecalciferol  5,000 Units Oral Daily  . docusate sodium  100 mg Oral BID  . DULoxetine  60 mg Oral Daily  . ferrous sulfate  325 mg Oral Daily  . folic acid  1 mg Oral Daily  . imatinib  400 mg Oral Daily  . insulin aspart  0-15 Units Subcutaneous 6 times per day  . metoprolol tartrate  12.5 mg Oral BID  . multivitamin with minerals  1 tablet Oral Daily  . pantoprazole  40 mg Oral BID  . QUEtiapine  200 mg  Oral Q2000  . sodium chloride flush  3 mL Intravenous Q12H  . sodium chloride flush  3 mL Intravenous Q12H  . tamsulosin  0.4 mg Oral Daily  . traZODone  100 mg Oral QHS  . vitamin B-12  1,000 mcg Oral Daily   Continuous Infusions: . dextrose 5 % and 0.9 % NaCl with KCl 40 mEq/L      Principal Problem:   SDH (subdural hematoma) (HCC) Active Problems:   Paroxysmal atrial fibrillation (HCC)   Anemia   Depression   Diabetes- Type 2 IDDM   Essential hypertension   Memory loss   CAD S/P CABG X 5 '02. RCA DES '04   Subdural hematoma (HCC)   Gastrointestinal stromal tumor (GIST)   Type 2 diabetes mellitus without complication, without long-term current use of insulin ([place])    Time spent: 45 minutes   [facility] Pager [phone]. If 7PM-7AM, please contact night-coverage at [email or web address], password [email or web address] 05/15/2015, 8:56 AM

## 2015-05-15 NOTE — ED Notes (Addendum)
Report called to 5 [facility] RN bed 39 confirmed
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
## 2015-05-15 NOTE — NC FL2 (Signed)
[place] LEVEL OF CARE SCREENING TOOL     IDENTIFICATION  Patient Name: [name] Birthdate: 12-05-1940 Sex: male Admission Date (Current Location): 05/14/2015  [facility] and [place] Number:  Herbalist and Address:  [facility] [place] [address], [place], [place] [address]      Provider Number: [id number]  Attending Physician Name and Address:  Reyne Dumas, MD  Relative Name and Phone Number:  Freda Munro daughter, [phone]    Current Level of Care: Hospital Recommended Level of Care: [place] Prior Approval Number:    Date Approved/Denied:   PASRR Number:    Discharge Plan: SNF    Current Diagnoses: Patient Active Problem List   Diagnosis Date Noted  . Subdural hematoma ([place]) 05/15/2015  . 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 Type 2 diabetes mellitus without complication, without long-term current use of insulin ([place])   . SDH (subdural hematoma) ([place]) 05/14/2015  . Degenerative disorder of central nervous system 04/10/2014  . Frequent unifocal PVCs 08/25/2013  . S/P CABG x 5   . Abnormal EKG 11/15/2012  . Dyslipidemia, goal LDL below 70 11/15/2012  . Memory loss   . Dysphagia, unspecified(787.20) 05/02/2012  . Hypokalemia 04/30/2012  . Leukocytosis, unspecified 04/30/2012  . Hypoglycemia 04/30/2012  . FTT (failure to thrive) in adult 04/29/2012  . Dehydration 04/29/2012  . 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 Left upper quadrant abdominal mass 04/07/2012  . GI bleeding 04/06/2012  . Acute post-hemorrhagic anemia 04/06/2012  . Acute on chronic renal failure ([place]) 04/05/2012  . Anemia 04/05/2012  . Lactic acidosis 04/05/2012  . Leukocytosis 04/05/2012  . H/O: CVA (cerebrovascular accident) 04/05/2012  . Depression 04/05/2012  . Diabetes- Type 2 IDDM 04/05/2012  . Essential hypertension 04/05/2012  . Erectile dysfunction 04/05/2012  . Paroxysmal atrial  fibrillation (HCC)   . CAD S/P CABG X 5 '02. RCA DES '04 04/17/2002    Orientation RESPIRATION BLADDER Height & Weight     Self, Time, Situation, Place  Normal Continent Weight: 84.868 kg (187 lb 1.6 oz) Height:  5\' 5"  (165.1 cm)  BEHAVIORAL SYMPTOMS/MOOD NEUROLOGICAL BOWEL NUTRITION STATUS      Continent  (Please see DC summary)  AMBULATORY STATUS COMMUNICATION OF NEEDS Skin   Extensive Assist Verbally Normal                       Personal Care Assistance Level of Assistance  Bathing, Feeding, Dressing Bathing Assistance: Maximum assistance Feeding assistance: Independent       Functional Limitations Info             SPECIAL CARE FACTORS FREQUENCY  OT (By licensed OT)     PT Frequency: min 3x/week OT Frequency: min 2x/week            Contractures      Additional Factors Info  Code Status, Allergies, Psychotropic, Insulin Sliding Scale Code Status Info: DNR Allergies Info: Morphine And Related Psychotropic Info: Seroquel, trazadone Insulin Sliding Scale Info: insulin aspart (novoLOG) injection 0-15 Units       Current Medications (05/15/2015):  This is the current hospital active medication list Current Facility-Administered Medications  Medication Dose Route Frequency Provider Last Rate Last Dose  . 0.9 %  sodium chloride infusion  250 mL Intravenous PRN Ilene Qua Opyd, MD      . amLODipine (NORVASC) tablet 10 mg  10 mg Oral Daily Vianne Bulls, MD  10 mg at 05/15/15 1021  . atorvastatin (LIPITOR) tablet 40 mg  40 mg Oral Daily Vianne Bulls, MD   40 mg at 05/15/15 1021  . bisacodyl (DULCOLAX) EC tablet 5 mg  5 mg Oral Daily PRN Vianne Bulls, MD      . cholecalciferol (VITAMIN D) tablet 5,000 Units  5,000 Units Oral Daily Vianne Bulls, MD   5,000 Units at 05/15/15 1021  . dextrose 5 % and 0.9 % NaCl with KCl 40 mEq/L infusion   Intravenous Continuous Reyne Dumas, MD 75 mL/hr at 05/15/15 0957 1,000 mL at 05/15/15 0957  . docusate sodium (COLACE)  capsule 100 mg  100 mg Oral BID Vianne Bulls, MD   100 mg at 05/15/15 1021  . DULoxetine (CYMBALTA) DR capsule 60 mg  60 mg Oral Daily Vianne Bulls, MD   60 mg at 05/15/15 1021  . ferrous sulfate tablet 325 mg  325 mg Oral Daily Vianne Bulls, MD   325 mg at [id number]  . folic acid (FOLVITE) tablet 1 mg  1 mg Oral Daily Vianne Bulls, MD   1 mg at 05/15/15 1021  . HYDROcodone-acetaminophen (NORCO/VICODIN) 5-325 MG per tablet 1-2 tablet  1-2 tablet Oral Q4H PRN Vianne Bulls, MD   1 tablet at 05/15/15 0529  . insulin aspart (novoLOG) injection 0-15 Units  0-15 Units Subcutaneous 6 times per day Vianne Bulls, MD   2 Units at 05/15/15 1236  . metoprolol tartrate (LOPRESSOR) tablet 12.5 mg  12.5 mg Oral BID Ilene Qua Opyd, MD   12.5 mg at 05/15/15 1021  . multivitamin with minerals tablet 1 tablet  1 tablet Oral Daily Vianne Bulls, MD   1 tablet at 05/15/15 1021  . nitroGLYCERIN (NITROSTAT) SL tablet 0.4 mg  0.4 mg Sublingual PRN Ilene Qua Opyd, MD      . ondansetron (ZOFRAN) tablet 4 mg  4 mg Oral Q6H PRN Vianne Bulls, MD       Or  . ondansetron (ZOFRAN) injection 4 mg  4 mg Intravenous Q6H PRN Ilene Qua Opyd, MD      . pantoprazole (PROTONIX) EC tablet 40 mg  40 mg Oral BID Vianne Bulls, MD   40 mg at 05/15/15 1021  . polyethylene glycol (MIRALAX / GLYCOLAX) packet 17 g  17 g Oral Daily PRN Vianne Bulls, MD      . QUEtiapine (SEROQUEL) tablet 200 mg  200 mg Oral Q2000 Vianne Bulls, MD   200 mg at 05/15/15 0144  . sodium chloride flush (NS) 0.9 % injection 3 mL  3 mL Intravenous Q12H Ilene Qua Opyd, MD   0 mL at 05/15/15 0146  . sodium chloride flush (NS) 0.9 % injection 3 mL  3 mL Intravenous Q12H Vianne Bulls, MD   3 mL at 05/15/15 0146  . sodium chloride flush (NS) 0.9 % injection 3 mL  3 mL Intravenous PRN Ilene Qua Opyd, MD      . tamsulosin (FLOMAX) capsule 0.4 mg  0.4 mg Oral Daily Vianne Bulls, MD   0.4 mg at 05/15/15 1021  . traZODone (DESYREL) tablet 100 mg  100  mg Oral QHS Vianne Bulls, MD   100 mg at 05/15/15 0143  . vitamin B-12 (CYANOCOBALAMIN) tablet 1,000 mcg  1,000 mcg Oral Daily Vianne Bulls, MD   1,000 mcg at 05/15/15 1021     Discharge Medications: Please see discharge summary for  a list of discharge medications.  Relevant Imaging Results:  Relevant Lab Results:   Additional Information SSN: [place], [place]
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
## 2015-05-16 DIAGNOSIS — C49A0 Gastrointestinal stromal tumor, unspecified site: Secondary | ICD-10-CM

## 2015-05-16 DIAGNOSIS — I6200 Nontraumatic subdural hemorrhage, unspecified: Secondary | ICD-10-CM

## 2015-05-16 DIAGNOSIS — E119 Type 2 diabetes mellitus without complications: Secondary | ICD-10-CM

## 2015-05-16 DIAGNOSIS — I2510 Atherosclerotic heart disease of native coronary artery without angina pectoris: Secondary | ICD-10-CM

## 2015-05-16 DIAGNOSIS — I10 Essential (primary) hypertension: Secondary | ICD-10-CM

## 2015-05-16 DIAGNOSIS — Z9861 Coronary angioplasty status: Secondary | ICD-10-CM

## 2015-05-16 LAB — BASIC METABOLIC PANEL
ANION GAP: 8 (ref 5–15)
BUN: 11 mg/dL (ref 6–20)
CO2: 26 mmol/L (ref 22–32)
Calcium: 8 mg/dL — ABNORMAL LOW (ref 8.9–10.3)
Chloride: 110 mmol/L (ref 101–111)
Creatinine, Ser: 1.57 mg/dL — ABNORMAL HIGH (ref 0.61–1.24)
GFR, EST AFRICAN AMERICAN: 49 mL/min — AB (ref >60)
GFR, EST NON AFRICAN AMERICAN: 42 mL/min — AB (ref >60)
Glucose, Bld: 123 mg/dL — ABNORMAL HIGH (ref 65–99)
POTASSIUM: 3.6 mmol/L (ref 3.5–5.1)
SODIUM: 144 mmol/L (ref 135–145)

## 2015-05-16 LAB — CBC
HEMATOCRIT: 34.7 % — AB (ref 39.0–52.0)
HEMOGLOBIN: 12.1 g/dL — AB (ref 13.0–17.0)
MCH: 35.2 pg — ABNORMAL HIGH (ref 26.0–34.0)
MCHC: 34.9 g/dL (ref 30.0–36.0)
MCV: 100.9 fL — ABNORMAL HIGH (ref 78.0–100.0)
Platelets: 152 10*3/uL (ref 150–400)
RBC: 3.44 MIL/uL — ABNORMAL LOW (ref 4.22–5.81)
RDW: 14 % (ref 11.5–15.5)
WBC: 6.4 10*3/uL (ref 4.0–10.5)

## 2015-05-16 LAB — GLUCOSE, CAPILLARY
GLUCOSE-CAPILLARY: 90 mg/dL (ref 65–99)
GLUCOSE-CAPILLARY: 124 mg/dL — AB (ref 65–99)
GLUCOSE-CAPILLARY: 199 mg/dL — AB (ref 65–99)
GLUCOSE-CAPILLARY: 210 mg/dL — AB (ref 65–99)
Glucose-Capillary: 147 mg/dL — ABNORMAL HIGH (ref 65–99)
Glucose-Capillary: 161 mg/dL — ABNORMAL HIGH (ref 65–99)

## 2015-05-16 LAB — HEMOGLOBIN A1C
HEMOGLOBIN A1C: 6.3 % — AB (ref 4.8–5.6)
Mean Plasma Glucose: 134 mg/dL

## 2015-05-16 MED ORDER — IMATINIB MESYLATE 100 MG PO TABS
400.0000 mg | ORAL_TABLET | Freq: Every day | ORAL | Status: DC
  Administered 2015-05-16 – 2015-05-17 (×2): 400 mg via ORAL
  Filled 2015-05-16 (×3): qty 4
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
## 2015-05-16 NOTE — Clinical Social Work Placement (Signed)
   CLINICAL SOCIAL WORK PLACEMENT  NOTE  Date:  05/16/2015  Patient Details  Name: [name] MRN: [id number] Date of Birth: 1940/05/01  Clinical Social Work is seeking post-discharge placement for this patient at the [place] level of care (*CSW will initial, date and re-position this form in  chart as items are completed):  Yes   Patient/family provided with [place] Work Department's list of facilities offering this level of care within the geographic area requested by the patient (or if unable, by the patient's family).  Yes   Patient/family informed of their freedom to choose among providers that offer the needed level of care, that participate in Medicare, Medicaid or managed care program needed by the patient, have an available bed and are willing to accept the patient.  Yes   Patient/family informed of [facility]'s ownership interest in [facility] and [facility], as well as of the fact that they are under no obligation to receive care at these facilities.  PASRR submitted to EDS on 05/15/15     PASRR number received on 05/15/15     Existing PASRR number confirmed on       FL2 transmitted to all facilities in geographic area requested by pt/family on 05/15/15     FL2 transmitted to all facilities within larger geographic area on       Patient informed that his/her managed care company has contracts with or will negotiate with certain facilities, including the following:            Patient/family informed of bed offers received.  Patient chooses bed at       Physician recommends and patient chooses bed at      Patient to be transferred to   on  .  Patient to be transferred to facility by       Patient family notified on   of transfer.  Name of family member notified:        PHYSICIAN Please sign FL2     Additional Comment:    _______________________________________________ Benard Halsted, [place] 05/16/2015,  1:47 PM

## 2015-05-16 NOTE — Clinical Social Work Note (Signed)
Clinical Social Work Assessment  Patient Details  Name: [name] MRN: [id number] Date of Birth: [birth year]  Date of referral:  05/16/15               Reason for consult:  Facility Placement                Permission sought to share information with:  Facility Sport and exercise psychologist, Family Supports Permission granted to share information::  Yes, Verbal Permission Granted  Name::     [name]  Agency::  [facility] SNFs  Relationship::  Daughter  Contact Information:  [phone]  Housing/Transportation Living arrangements for the past 2 months:  [place] of Information:  Patient Patient Interpreter Needed:  None Criminal Activity/Legal Involvement Pertinent to Current Situation/Hospitalization:  No - Comment as needed Significant Relationships:  Adult Children, Friend Lives with:  Self Do you feel safe going back to the place where you live?  No Need for family participation in patient care:  Yes (Comment)  Care giving concerns:  CSW received referral for possible SNF placement at time of discharge. CSW met with patient at bedside regarding PT recommendation of SNF placement at time of discharge. Patient lives alone and is currently unable to care for himselfat home given patient's current physical needs and fall risk. Patient expressed understanding of PT recommendation and is agreeable to SNF placement at time of discharge. CSW to continue to follow and assist with discharge planning needs.   Social Worker assessment / plan:  CSW spoke with patient concerning possibility of rehab at [facility] before returning home.  Employment status:  Retired Forensic scientist:  Medicare PT Recommendations:  [place] / Referral to community resources:  [place]  Patient/Family's Response to care:  Patient recognizes need for rehab before returning home and is agreeable to a SNF in [place]. Patient has no preference and  has never been to a facility. Patient expressed understanding that he will need to stay until Saturday for Medicare to cover SNF stay.  Patient/Family's Understanding of and Emotional Response to Diagnosis, Current Treatment, and Prognosis:  Patient is realistic regarding therapy needs. No questions/concerns about plan or treatment.    Emotional Assessment Appearance:  Appears stated age Attitude/Demeanor/Rapport:   (Appropriate) Affect (typically observed):  Accepting, Appropriate Orientation:  Oriented to Self, Oriented to Place, Oriented to  Time, Oriented to Situation Alcohol / Substance use:    Psych involvement (Current and /or in the community):  No (Comment)  Discharge Needs  Concerns to be addressed:  Care Coordination Readmission within the last 30 days:  No Current discharge risk:  None Barriers to Discharge:  Continued Medical Work up   [employer], [place] 05/16/2015, 1:43 PM

## 2015-05-17 DIAGNOSIS — R4182 Altered mental status, unspecified: Secondary | ICD-10-CM | POA: Insufficient documentation

## 2015-05-17 DIAGNOSIS — R400 Somnolence: Secondary | ICD-10-CM

## 2015-05-17 LAB — GLUCOSE, CAPILLARY
GLUCOSE-CAPILLARY: 143 mg/dL — AB (ref 65–99)
GLUCOSE-CAPILLARY: 182 mg/dL — AB (ref 65–99)
Glucose-Capillary: 101 mg/dL — ABNORMAL HIGH (ref 65–99)
Glucose-Capillary: 103 mg/dL — ABNORMAL HIGH (ref 65–99)
Glucose-Capillary: 132 mg/dL — ABNORMAL HIGH (ref 65–99)

## 2015-05-17 MED ORDER — DOCUSATE SODIUM 100 MG PO CAPS: 100.0000 mg | ORAL_CAPSULE | Freq: Two times a day (BID) | ORAL | Status: AC

## 2015-05-17 MED ORDER — GLIMEPIRIDE 2 MG PO TABS: 2.0000 mg | ORAL_TABLET | Freq: Every day | ORAL | Status: AC

## 2015-05-17 MED ORDER — ASPIRIN 325 MG PO TABS: 325.0000 mg | ORAL_TABLET | Freq: Every day | ORAL | Status: AC

## 2015-05-17 MED ORDER — HYDROCODONE-ACETAMINOPHEN 5-325 MG PO TABS: 1.0000 | ORAL_TABLET | Freq: Four times a day (QID) | ORAL | Status: AC | PRN

## 2015-05-17 MED FILL — GLIMEPIRIDE 2 MG TABLET: 2 | 30 days supply | Qty: 30 | Fill #0

## 2015-05-17 NOTE — Progress Notes (Signed)
Patient does not meet 3 night inpatient stay criteria for SNF placement. Also, patient's drug Gleevec is too expensive for SNFs.   Patient states he will go home with home health services.  Percell Locus Kaheem Halleck LCSWA [phone]

## 2015-05-17 NOTE — Care Management Important Message (Signed)
Important Message  Patient Details  Name: [name] MRN: [id number] Date of Birth: [birth year]   Medicare Important Message Given:  Yes    Nathen May 05/17/2015, 11:21 AM
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
## 2015-05-17 NOTE — Progress Notes (Signed)
Nsg Discharge Note  Admit Date:  05/14/2015 Discharge date: 05/17/2015   [name] to be D/C'd Home with home health per MD order.  AVS completed.  Copy for chart, and copy for patient signed, and dated. Patient/caregiver able to verbalize understanding.  Discharge Medication:   Medication List    STOP taking these medications        clopidogrel 75 MG tablet  Commonly known as:  PLAVIX      TAKE these medications        amLODipine 10 MG tablet  Commonly known as:  NORVASC  Take 1 tablet by mouth daily.     aspirin 325 MG tablet  Take 1 tablet (325 mg total) by mouth daily.  Start taking on:  05/27/2015     atorvastatin 40 MG tablet  Commonly known as:  LIPITOR  Take 40 mg by mouth daily.     Biotin 7500 MCG Tabs  Take 7,500 mcg by mouth.     docusate sodium 100 MG capsule  Commonly known as:  COLACE  Take 1 capsule (100 mg total) by mouth 2 (two) times daily.     DULoxetine 60 MG capsule  Commonly known as:  CYMBALTA  Take 60 mg by mouth daily.     ferrous sulfate 325 (65 FE) MG EC tablet  Take 325 mg by mouth daily.     folic acid 1 MG tablet  Commonly known as:  FOLVITE  Take 1 mg by mouth daily.     furosemide 20 MG tablet  Commonly known as:  LASIX  Take 20 mg by mouth daily.     GLEEVEC 400 MG tablet  Generic drug:  imatinib  TAKE 1 TABLET BY MOUTH ONCE DAILY     glimepiride 2 MG tablet  Commonly known as:  AMARYL  Take 1 tablet (2 mg total) by mouth daily.     HYDROcodone-acetaminophen 5-325 MG tablet  Commonly known as:  NORCO/VICODIN  Take 1 tablet by mouth every 6 (six) hours as needed for moderate pain. Every 4-6 hours as needed for pain     metFORMIN 500 MG tablet  Commonly known as:  GLUCOPHAGE  Take 1 tablet by mouth daily.     metoprolol tartrate 25 MG tablet  Commonly known as:  LOPRESSOR  Take 12.5 mg by mouth 2 (two) times daily.     MULTI-VITAMINS Tabs  Take 1 tablet by mouth daily.     NITROSTAT 0.4 MG SL tablet   Generic drug:  nitroGLYCERIN  Place 0.4 mg under the tongue as needed for chest pain.     pantoprazole 40 MG tablet  Commonly known as:  PROTONIX  Take 40 mg by mouth 2 (two) times daily.     potassium chloride SA 20 MEQ tablet  Commonly known as:  K-DUR,KLOR-CON  TAKE 1 TABLET TWICE DAILY     QUEtiapine 200 MG tablet  Commonly known as:  SEROQUEL  Take 1 tablet (200 mg total) by mouth daily at 8 pm.     RAPAFLO 8 MG Caps capsule  Generic drug:  silodosin  Take 8 mg by mouth daily with breakfast.     traZODone 100 MG tablet  Commonly known as:  DESYREL  Take 100 mg by mouth at bedtime.     triamcinolone cream 0.1 %  Commonly known as:  KENALOG  Apply 1 application topically 2 (two) times daily.     vitamin B-12 1000 MCG tablet  Commonly known as:  CYANOCOBALAMIN  Take 1,000 mcg by mouth daily.     Vitamin D-3 5000 UNITS Tabs  Take 1 tablet by mouth daily.        Discharge Assessment: Filed Vitals:   05/17/15 0518 05/17/15 1314  BP: 123/50 116/52  Pulse: 49 105  Temp: 98 F (36.7 C) 97.9 F (36.6 C)  Resp: 16 18   Skin clean, dry and intact without evidence of skin break down, no evidence of skin tears noted. IV catheter discontinued intact. Site without signs and symptoms of complications - no redness or edema noted at insertion site, patient denies c/o pain - only slight tenderness at site.  Dressing with slight pressure applied.  D/c Instructions-Education: Discharge instructions given to patient/family with verbalized understanding. D/c education completed with patient/family including follow up instructions, medication list, d/c activities limitations if indicated, with other d/c instructions as indicated by MD - patient able to verbalize understanding, all questions fully answered. Patient instructed to return to ED, call 911, or call MD for any changes in condition.  Patient escorted via [place], and D/C home via private auto.  [name] Points,  RN 05/17/2015 5:50 PM
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
## 2015-05-17 NOTE — Care Management Note (Signed)
Case Management Note  Patient Details  Name: ELMIN REININGER MRN: [id number] Date of Birth: Aug 02, 1940  Subjective/Objective:                 Patient stay not approved through SNF 2/2 to 2 day stay and chemo drug cost. CSW and CM at bedside to discuss dispo plan with patient   Action/Plan:  Patient to dc to home w/ [facility], Referral made to [facility] for all services, and 3in1, CSW updating family Expected Discharge Date:                  Expected Discharge Plan:     In-House Referral:  Clinical Social Work  Discharge planning Services  CM Consult  Post Acute Care Choice:  Museum/gallery conservator, Home Health Choice offered to:  Patient  DME Arranged:  3-N-1 DME Agency:  [place]:  RN, PT, OT, Nurse's Aide, Social Work [employer] Agency:     Status of Service:  Completed, signed off  Medicare Important Message Given:  Yes Date Medicare IM Given:    Medicare IM give by:    Date Additional Medicare IM Given:    Additional Medicare Important Message give by:     If discussed at [name] of Stay Meetings, dates discussed:    Additional Comments:  Carles Collet, RN 05/17/2015, 3:06 PM

## 2015-05-20 ENCOUNTER — Other Ambulatory Visit: Payer: Self-pay | Admitting: Neurological Surgery

## 2015-05-20 DIAGNOSIS — S065X9A Traumatic subdural hemorrhage with loss of consciousness of unspecified duration, initial encounter: Secondary | ICD-10-CM

## 2015-05-20 DIAGNOSIS — S065XAA Traumatic subdural hemorrhage with loss of consciousness status unknown, initial encounter: Secondary | ICD-10-CM

## 2015-05-28 ENCOUNTER — Other Ambulatory Visit (HOSPITAL_BASED_OUTPATIENT_CLINIC_OR_DEPARTMENT_OTHER): Payer: Medicare Other

## 2015-05-28 ENCOUNTER — Telehealth: Payer: Self-pay | Admitting: Oncology

## 2015-05-28 ENCOUNTER — Encounter: Payer: Self-pay | Admitting: Oncology

## 2015-05-28 ENCOUNTER — Ambulatory Visit (HOSPITAL_BASED_OUTPATIENT_CLINIC_OR_DEPARTMENT_OTHER): Payer: Medicare Other | Admitting: Oncology

## 2015-05-28 VITALS — BP 143/75 | HR 80 | Temp 99.1°F | Resp 18 | Ht 67.0 in | Wt 190.7 lb

## 2015-05-28 DIAGNOSIS — E119 Type 2 diabetes mellitus without complications: Secondary | ICD-10-CM | POA: Diagnosis not present

## 2015-05-28 DIAGNOSIS — R197 Diarrhea, unspecified: Secondary | ICD-10-CM | POA: Diagnosis not present

## 2015-05-28 DIAGNOSIS — C166 Malignant neoplasm of greater curvature of stomach, unspecified: Secondary | ICD-10-CM

## 2015-05-28 DIAGNOSIS — IMO0002 Reserved for concepts with insufficient information to code with codable children: Secondary | ICD-10-CM

## 2015-05-28 DIAGNOSIS — N19 Unspecified kidney failure: Secondary | ICD-10-CM

## 2015-05-28 LAB — CBC WITH DIFFERENTIAL/PLATELET
BASO%: 0.1 % (ref 0.0–2.0)
Basophils Absolute: 0 10*3/uL (ref 0.0–0.1)
EOS%: 1.2 % (ref 0.0–7.0)
Eosinophils Absolute: 0.1 10*3/uL (ref 0.0–0.5)
HEMATOCRIT: 35.4 % — AB (ref 38.4–49.9)
HEMOGLOBIN: 12.5 g/dL — AB (ref 13.0–17.1)
LYMPH#: 1.9 10*3/uL (ref 0.9–3.3)
LYMPH%: 25.5 % (ref 14.0–49.0)
MCH: 35.2 pg — ABNORMAL HIGH (ref 27.2–33.4)
MCHC: 35.3 g/dL (ref 32.0–36.0)
MCV: 99.7 fL — ABNORMAL HIGH (ref 79.3–98.0)
MONO#: 0.5 10*3/uL (ref 0.1–0.9)
MONO%: 6.2 % (ref 0.0–14.0)
NEUT#: 5.1 10*3/uL (ref 1.5–6.5)
NEUT%: 67 % (ref 39.0–75.0)
Platelets: 173 10*3/uL (ref 140–400)
RBC: 3.55 10*6/uL — ABNORMAL LOW (ref 4.20–5.82)
RDW: 13.7 % (ref 11.0–14.6)
WBC: 7.6 10*3/uL (ref 4.0–10.3)

## 2015-05-28 LAB — COMPREHENSIVE METABOLIC PANEL
ALT: 10 U/L (ref 0–55)
AST: 23 U/L (ref 5–34)
Albumin: 3.4 g/dL — ABNORMAL LOW (ref 3.5–5.0)
Alkaline Phosphatase: 55 U/L (ref 40–150)
Anion Gap: 9 mEq/L (ref 3–11)
BUN: 10.3 mg/dL (ref 7.0–26.0)
CALCIUM: 8.2 mg/dL — AB (ref 8.4–10.4)
CHLORIDE: 107 meq/L (ref 98–109)
CO2: 29 meq/L (ref 22–29)
CREATININE: 1.6 mg/dL — AB (ref 0.7–1.3)
EGFR: 48 mL/min/{1.73_m2} — ABNORMAL LOW (ref >90)
GLUCOSE: 223 mg/dL — AB (ref 70–140)
POTASSIUM: 3.5 meq/L (ref 3.5–5.1)
SODIUM: 144 meq/L (ref 136–145)
Total Bilirubin: 0.36 mg/dL (ref 0.20–1.20)
Total Protein: 6.3 g/dL — ABNORMAL LOW (ref 6.4–8.3)

## 2015-05-28 NOTE — Telephone Encounter (Signed)
per pf to sch pt appt-gave pt copy of avs °
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
## 2015-06-03 ENCOUNTER — Ambulatory Visit
Admission: RE | Admit: 2015-06-03 | Discharge: 2015-06-03 | Disposition: A | Discharge status: Home / Self Care | Payer: Medicare Other | Source: Ambulatory Visit | Attending: Neurological Surgery | Admitting: Neurological Surgery

## 2015-06-03 DIAGNOSIS — S065X9A Traumatic subdural hemorrhage with loss of consciousness of unspecified duration, initial encounter: Secondary | ICD-10-CM

## 2015-06-03 DIAGNOSIS — S065XAA Traumatic subdural hemorrhage with loss of consciousness status unknown, initial encounter: Secondary | ICD-10-CM

## 2015-06-04 ENCOUNTER — Other Ambulatory Visit: Payer: Self-pay | Admitting: Neurological Surgery

## 2015-06-04 ENCOUNTER — Other Ambulatory Visit (HOSPITAL_COMMUNITY): Payer: Self-pay | Admitting: Neurological Surgery

## 2015-06-04 DIAGNOSIS — S065XAA Traumatic subdural hemorrhage with loss of consciousness status unknown, initial encounter: Secondary | ICD-10-CM

## 2015-06-04 DIAGNOSIS — S065X9A Traumatic subdural hemorrhage with loss of consciousness of unspecified duration, initial encounter: Secondary | ICD-10-CM

## 2015-06-04 DIAGNOSIS — S065X0A Traumatic subdural hemorrhage without loss of consciousness, initial encounter: Secondary | ICD-10-CM

## 2015-06-12 ENCOUNTER — Encounter (HOSPITAL_COMMUNITY): Payer: Self-pay

## 2015-06-12 ENCOUNTER — Encounter (HOSPITAL_COMMUNITY)
Admission: RE | Admit: 2015-06-12 | Discharge: 2015-06-12 | Disposition: A | Discharge status: Home / Self Care | Payer: Medicare Other | Source: Ambulatory Visit | Attending: Neurological Surgery | Admitting: Neurological Surgery

## 2015-06-12 DIAGNOSIS — I10 Essential (primary) hypertension: Secondary | ICD-10-CM | POA: Insufficient documentation

## 2015-06-12 DIAGNOSIS — Z79899 Other long term (current) drug therapy: Secondary | ICD-10-CM | POA: Insufficient documentation

## 2015-06-12 DIAGNOSIS — G4733 Obstructive sleep apnea (adult) (pediatric): Secondary | ICD-10-CM | POA: Diagnosis not present

## 2015-06-12 DIAGNOSIS — F329 Major depressive disorder, single episode, unspecified: Secondary | ICD-10-CM | POA: Insufficient documentation

## 2015-06-12 DIAGNOSIS — E119 Type 2 diabetes mellitus without complications: Secondary | ICD-10-CM | POA: Insufficient documentation

## 2015-06-12 DIAGNOSIS — Z7982 Long term (current) use of aspirin: Secondary | ICD-10-CM | POA: Diagnosis not present

## 2015-06-12 DIAGNOSIS — Z7984 Long term (current) use of oral hypoglycemic drugs: Secondary | ICD-10-CM | POA: Diagnosis not present

## 2015-06-12 DIAGNOSIS — Z7902 Long term (current) use of antithrombotics/antiplatelets: Secondary | ICD-10-CM | POA: Insufficient documentation

## 2015-06-12 DIAGNOSIS — Z01818 Encounter for other preprocedural examination: Secondary | ICD-10-CM | POA: Diagnosis present

## 2015-06-12 DIAGNOSIS — I2510 Atherosclerotic heart disease of native coronary artery without angina pectoris: Secondary | ICD-10-CM | POA: Diagnosis not present

## 2015-06-12 DIAGNOSIS — Z0183 Encounter for blood typing: Secondary | ICD-10-CM | POA: Diagnosis not present

## 2015-06-12 DIAGNOSIS — Z794 Long term (current) use of insulin: Secondary | ICD-10-CM

## 2015-06-12 DIAGNOSIS — E7800 Pure hypercholesterolemia, unspecified: Secondary | ICD-10-CM | POA: Diagnosis not present

## 2015-06-12 DIAGNOSIS — Z8673 Personal history of transient ischemic attack (TIA), and cerebral infarction without residual deficits: Secondary | ICD-10-CM | POA: Diagnosis not present

## 2015-06-12 DIAGNOSIS — Z01810 Encounter for preprocedural cardiovascular examination: Secondary | ICD-10-CM | POA: Diagnosis not present

## 2015-06-12 DIAGNOSIS — Z01812 Encounter for preprocedural laboratory examination: Secondary | ICD-10-CM | POA: Insufficient documentation

## 2015-06-12 DIAGNOSIS — Z951 Presence of aortocoronary bypass graft: Secondary | ICD-10-CM | POA: Diagnosis not present

## 2015-06-12 DIAGNOSIS — R9431 Abnormal electrocardiogram [ECG] [EKG]: Secondary | ICD-10-CM

## 2015-06-12 DIAGNOSIS — R413 Other amnesia: Secondary | ICD-10-CM | POA: Diagnosis not present

## 2015-06-12 DIAGNOSIS — I25810 Atherosclerosis of coronary artery bypass graft(s) without angina pectoris: Secondary | ICD-10-CM | POA: Diagnosis not present

## 2015-06-12 DIAGNOSIS — Z955 Presence of coronary angioplasty implant and graft: Secondary | ICD-10-CM | POA: Insufficient documentation

## 2015-06-12 DIAGNOSIS — I6200 Nontraumatic subdural hemorrhage, unspecified: Secondary | ICD-10-CM

## 2015-06-12 HISTORY — DX: Headache: R51

## 2015-06-12 HISTORY — DX: Hypertensive heart disease without heart failure: I11.9

## 2015-06-12 HISTORY — DX: Sleep apnea, unspecified: G47.30

## 2015-06-12 HISTORY — DX: Traumatic subdural hemorrhage with loss of consciousness of unspecified duration, initial encounter: S06.5X9A

## 2015-06-12 HISTORY — DX: Atherosclerotic heart disease of native coronary artery without angina pectoris: I25.10

## 2015-06-12 HISTORY — DX: Traumatic subdural hemorrhage with loss of consciousness status unknown, initial encounter: S06.5XAA

## 2015-06-12 HISTORY — DX: Headache, unspecified: R51.9

## 2015-06-12 LAB — CBC
HCT: 36.5 % — ABNORMAL LOW (ref 39.0–52.0)
Hemoglobin: 12.6 g/dL — ABNORMAL LOW (ref 13.0–17.0)
MCH: 34.2 pg — ABNORMAL HIGH (ref 26.0–34.0)
MCHC: 34.5 g/dL (ref 30.0–36.0)
MCV: 99.2 fL (ref 78.0–100.0)
PLATELETS: 183 10*3/uL (ref 150–400)
RBC: 3.68 MIL/uL — AB (ref 4.22–5.81)
RDW: 13.8 % (ref 11.5–15.5)
WBC: 7.4 10*3/uL (ref 4.0–10.5)

## 2015-06-12 LAB — BASIC METABOLIC PANEL
Anion gap: 10 (ref 5–15)
BUN: 11 mg/dL (ref 6–20)
CALCIUM: 8.5 mg/dL — AB (ref 8.9–10.3)
CHLORIDE: 106 mmol/L (ref 101–111)
CO2: 28 mmol/L (ref 22–32)
CREATININE: 1.66 mg/dL — AB (ref 0.61–1.24)
GFR, EST AFRICAN AMERICAN: 46 mL/min — AB (ref >60)
GFR, EST NON AFRICAN AMERICAN: 39 mL/min — AB (ref >60)
Glucose, Bld: 198 mg/dL — ABNORMAL HIGH (ref 65–99)
Potassium: 3.4 mmol/L — ABNORMAL LOW (ref 3.5–5.1)
SODIUM: 144 mmol/L (ref 135–145)

## 2015-06-12 LAB — TYPE AND SCREEN
ABO/RH(D): O POS
ANTIBODY SCREEN: NEGATIVE

## 2015-06-12 LAB — GLUCOSE, CAPILLARY: GLUCOSE-CAPILLARY: 214 mg/dL — AB (ref 65–99)

## 2015-06-12 MED ORDER — METOPROLOL TARTRATE 25 MG PO TABS: 25.0000 mg | ORAL_TABLET | Freq: Two times a day (BID) | ORAL | Status: AC
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
## 2015-06-12 NOTE — Progress Notes (Signed)
Pt denies SOB and chest pain but is under the care of Dr. Ellyn Hack, Cardiology. Pt stated that his last dose of Plavix was June 09, 2015 as instructed by MD. Pt stated that he was instructed to decrease daily dose of Aspirin 325mg  to 81 mg but was not sure if he was advised to stop pre-operatively; lvm on Jessica's voice mailbox for clarification of pre-op Aspirin instructions. Ebony Hail, [place], Anesthesia, spoke with Lorriane Shire, Surgical Scheduler, regarding pre-op Aspirin instructions; Lorriane Shire to clarify and notify pt regarding Aspirin, pre-operatively. Pt denies having an EKG at cardiologist office or other medical facility within the last year; no EKG tracing in Epic for last 12 months. Ebony Hail, [place], Anesthesia reviewed pt history ( see note).
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
## 2015-06-12 NOTE — Pre-Procedure Instructions (Signed)
[name]  06/12/2015      [facility] - [place], [place] - [address] [place] [address] Phone: [phone] Fax: [phone]  [id number] [address] [place] [address] Phone: [phone] Fax: [phone]  [place], [address] [place] [address] Phone: [phone] Fax: [phone]    Your procedure is scheduled on Monday, Jun 17, 2015  Report to [facility] Admitting at 11:00 A.M. ( per MD)  Call this number if you have problems the morning of surgery:  [phone]   Remember:  Do not eat food or drink liquids after midnight Sunday, June 16, 2015  Take these medicines the morning of surgery with A SIP OF WATER: amLODipine (NORVASC), DULoxetine (CYMBALTA), GLEEVEC, levETIRAcetam (KEPPRA),  metoprolol tartrate (LOPRESSOR), pantoprazole (PROTONIX), RAPAFLO, if needed:HYDROcodone-acetaminophen (NORCO/VICODIN) for pain, NITROSTAT for chest pain   Stop taking vitamins, fish oil, and herbal medications. Do not take any NSAIDs ie: Ibuprofen, Advil, Naproxen, BC and Goody Powder   How to Manage Your Diabetes Before and After Surgery  Why is it important to control my blood sugar before and after surgery? . Improving blood sugar levels before and after surgery helps healing and can limit problems. . A way of improving blood sugar control is eating a healthy diet by: o  Eating less sugar and carbohydrates o  Increasing activity/exercise o  Talking with your doctor about reaching your blood sugar goals . High blood sugars (greater than 180 mg/dL) can raise your risk of infections and slow your recovery, so you will need to focus on controlling your diabetes during the weeks before surgery. . Make sure that the doctor who takes care of your diabetes knows about your planned  surgery including the date and location.  How do I manage my blood sugar before surgery? . Check your blood sugar at least 4 times a day, starting 2 days before surgery, to make sure that the level is not too high or low. o Check your blood sugar the morning of your surgery when you wake up and every 2 hours until you get to the Short Stay unit. . If your blood sugar is less than 70 mg/dL, you will need to treat for low blood sugar: o Do not take insulin. o Treat a low blood sugar (less than 70 mg/dL) with  cup of clear juice (cranberry or apple), 4 glucose tablets, OR glucose gel. o Recheck blood sugar in 15 minutes after treatment (to make sure it is greater than 70 mg/dL). If your blood sugar is not greater than 70 mg/dL on recheck, call [phone] for further instructions. . Report your blood sugar to the short stay nurse when you get to Short Stay.  . If you are admitted to the hospital after surgery: o Your blood sugar will be checked by the staff and you will probably be given insulin after surgery (instead of oral diabetes medicines) to make sure you have good blood sugar levels. o The goal for blood sugar control after surgery is 80-180 mg/dL.  WHAT DO I DO ABOUT MY DIABETES MEDICATION?   Marland Kitchen Do not take oral diabetes medicines (pills) the morning of surgery such as metFORMIN (GLUCOPHAGE) and glimepiride (AMARYL)   Other Instructions: Do not take glimepiride (AMARYL) the night before surgery   Patient Signature:  Date:  Nurse Signature:  Date:   Reviewed and Endorsed by [facility] Patient Education Committee, August 2015  Do not wear jewelry, make-up or nail polish.  Do not wear lotions, powders, or perfumes.  You may not wear deodorant.  Do not shave 48 hours prior to surgery.  Men may shave face and neck.  Do not bring valuables to the hospital.  [facility] is not responsible for any belongings or valuables.  Contacts, dentures or bridgework may not be worn into  surgery.  Leave your suitcase in the car.  After surgery it may be brought to your room.  For patients admitted to the hospital, discharge time will be determined by your treatment team.  Patients discharged the day of surgery will not be allowed to drive home.   Name and phone number of your driver:   Special instructions: Shower the night before surgery and the morning of surgery with CHG.  Please read over the following fact sheets that you were given. Pain Booklet, Coughing and Deep Breathing, Blood Transfusion Information and Surgical Site Infection Prevention

## 2015-06-13 ENCOUNTER — Telehealth: Payer: Self-pay | Admitting: *Deleted

## 2015-06-13 LAB — HEMOGLOBIN A1C
Hgb A1c MFr Bld: 6.3 % — ABNORMAL HIGH (ref 4.8–5.6)
Mean Plasma Glucose: 134 mg/dL

## 2015-06-13 NOTE — Telephone Encounter (Addendum)
Request for surgical clearance:  1. What type of surgery is being performed? Right craniotomy for evacuation of a subdural hemorrhage  2. When is this surgery scheduled? 06/17/2015  3. Are there any medications that need to be held prior to surgery and how long? PLAVIX AND ASPIRIN  4. Name of physician performing surgery?  DR BENJAMIN DITTY   5. What is your office phone and fax number? [phone]  6.

## 2015-06-13 NOTE — Telephone Encounter (Signed)
Please see consult note in the computer. Already done. Okay to hold Plavix 7 days preop. In fact with subdural hematoma, he can have it stopped now if necessary.   Leonie Man, [name], M.S. Interventional Cardiologist   Pager # [phone] Phone # [phone] [address] [place], [place] [address]

## 2015-06-14 NOTE — Telephone Encounter (Signed)
Routed information to surgery scheduling department for Dr Cyndy Freeze

## 2015-06-15 ENCOUNTER — Telehealth: Payer: Self-pay | Admitting: Physician Assistant

## 2015-06-15 ENCOUNTER — Other Ambulatory Visit: Payer: Self-pay | Admitting: Physician Assistant

## 2015-06-15 NOTE — Telephone Encounter (Signed)
Patient called after our cardiology answering service as he runs out his insulin before his craniotomy. He was instructed by his neurosurgeon to get his diabetes under control prior to the surgery. His blood sugar is now over 200. Unfortunately, it does not appear his PCP is part of [place] system, and I have no access to his PCPs note. Our system shows he is not on insulin. He is still on metformin and Amaryl. I do not know the dosage of his insulin, he says he is on sliding scale, I do not feel comfortable enough to prescribe him sliding scale insulin over the phone. I have instructed him to talk to his PCP and neurosurgeon.  Hilbert Corrigan PA Pager: [phone]

## 2015-06-17 ENCOUNTER — Inpatient Hospital Stay (HOSPITAL_COMMUNITY): Payer: Medicare Other | Admitting: Vascular Surgery

## 2015-06-17 ENCOUNTER — Inpatient Hospital Stay (HOSPITAL_COMMUNITY)
Admission: RE | Admit: 2015-06-17 | Discharge: 2015-06-18 | DRG: 027 | Disposition: A | Discharge status: Home Health | Payer: Medicare Other | Source: Ambulatory Visit | Attending: Neurological Surgery | Admitting: Neurological Surgery

## 2015-06-17 ENCOUNTER — Encounter (HOSPITAL_COMMUNITY): Payer: Self-pay | Admitting: *Deleted

## 2015-06-17 ENCOUNTER — Encounter (HOSPITAL_COMMUNITY)
Admission: RE | Disposition: A | Discharge status: Home Health | Payer: Self-pay | Source: Ambulatory Visit | Attending: Neurological Surgery

## 2015-06-17 ENCOUNTER — Inpatient Hospital Stay (HOSPITAL_COMMUNITY): Payer: Medicare Other | Admitting: Certified Registered Nurse Anesthetist

## 2015-06-17 ENCOUNTER — Other Ambulatory Visit: Payer: Self-pay

## 2015-06-17 ENCOUNTER — Ambulatory Visit (HOSPITAL_COMMUNITY)
Admission: RE | Admit: 2015-06-17 | Discharge: 2015-06-17 | Disposition: A | Discharge status: Home / Self Care | Payer: Medicare Other | Source: Ambulatory Visit | Attending: Neurological Surgery | Admitting: Neurological Surgery

## 2015-06-17 DIAGNOSIS — Z903 Acquired absence of stomach [part of]: Secondary | ICD-10-CM

## 2015-06-17 DIAGNOSIS — Z7902 Long term (current) use of antithrombotics/antiplatelets: Secondary | ICD-10-CM | POA: Diagnosis not present

## 2015-06-17 DIAGNOSIS — Z9842 Cataract extraction status, left eye: Secondary | ICD-10-CM | POA: Diagnosis not present

## 2015-06-17 DIAGNOSIS — I2510 Atherosclerotic heart disease of native coronary artery without angina pectoris: Secondary | ICD-10-CM | POA: Diagnosis present

## 2015-06-17 DIAGNOSIS — Z8673 Personal history of transient ischemic attack (TIA), and cerebral infarction without residual deficits: Secondary | ICD-10-CM | POA: Diagnosis not present

## 2015-06-17 DIAGNOSIS — I119 Hypertensive heart disease without heart failure: Secondary | ICD-10-CM | POA: Diagnosis present

## 2015-06-17 DIAGNOSIS — X58XXXA Exposure to other specified factors, initial encounter: Secondary | ICD-10-CM | POA: Insufficient documentation

## 2015-06-17 DIAGNOSIS — Z9841 Cataract extraction status, right eye: Secondary | ICD-10-CM

## 2015-06-17 DIAGNOSIS — Z7984 Long term (current) use of oral hypoglycemic drugs: Secondary | ICD-10-CM | POA: Diagnosis not present

## 2015-06-17 DIAGNOSIS — K219 Gastro-esophageal reflux disease without esophagitis: Secondary | ICD-10-CM | POA: Diagnosis present

## 2015-06-17 DIAGNOSIS — I6203 Nontraumatic chronic subdural hemorrhage: Principal | ICD-10-CM | POA: Diagnosis present

## 2015-06-17 DIAGNOSIS — S065X0A Traumatic subdural hemorrhage without loss of consciousness, initial encounter: Secondary | ICD-10-CM | POA: Insufficient documentation

## 2015-06-17 DIAGNOSIS — Z9861 Coronary angioplasty status: Secondary | ICD-10-CM | POA: Diagnosis not present

## 2015-06-17 DIAGNOSIS — Z8500 Personal history of malignant neoplasm of unspecified digestive organ: Secondary | ICD-10-CM | POA: Diagnosis not present

## 2015-06-17 DIAGNOSIS — E7800 Pure hypercholesterolemia, unspecified: Secondary | ICD-10-CM | POA: Diagnosis present

## 2015-06-17 DIAGNOSIS — Z885 Allergy status to narcotic agent status: Secondary | ICD-10-CM | POA: Diagnosis not present

## 2015-06-17 DIAGNOSIS — Z7982 Long term (current) use of aspirin: Secondary | ICD-10-CM

## 2015-06-17 DIAGNOSIS — Z23 Encounter for immunization: Secondary | ICD-10-CM

## 2015-06-17 DIAGNOSIS — Z951 Presence of aortocoronary bypass graft: Secondary | ICD-10-CM

## 2015-06-17 DIAGNOSIS — J3489 Other specified disorders of nose and nasal sinuses: Secondary | ICD-10-CM | POA: Insufficient documentation

## 2015-06-17 DIAGNOSIS — Z79899 Other long term (current) drug therapy: Secondary | ICD-10-CM

## 2015-06-17 DIAGNOSIS — G4730 Sleep apnea, unspecified: Secondary | ICD-10-CM | POA: Diagnosis present

## 2015-06-17 DIAGNOSIS — S065XAA Traumatic subdural hemorrhage with loss of consciousness status unknown, initial encounter: Secondary | ICD-10-CM | POA: Diagnosis present

## 2015-06-17 DIAGNOSIS — S065X9A Traumatic subdural hemorrhage with loss of consciousness of unspecified duration, initial encounter: Secondary | ICD-10-CM | POA: Diagnosis present

## 2015-06-17 DIAGNOSIS — I6523 Occlusion and stenosis of bilateral carotid arteries: Secondary | ICD-10-CM | POA: Insufficient documentation

## 2015-06-17 DIAGNOSIS — E118 Type 2 diabetes mellitus with unspecified complications: Secondary | ICD-10-CM | POA: Diagnosis present

## 2015-06-17 DIAGNOSIS — Z9889 Other specified postprocedural states: Secondary | ICD-10-CM

## 2015-06-17 HISTORY — PX: CRANIOTOMY: SHX93

## 2015-06-17 HISTORY — PX: APPLICATION OF CRANIAL NAVIGATION: SHX6578

## 2015-06-17 LAB — PROTIME-INR
INR: 1.15 (ref 0.00–1.49)
Prothrombin Time: 14.9 seconds (ref 11.6–15.2)

## 2015-06-17 LAB — GLUCOSE, CAPILLARY
GLUCOSE-CAPILLARY: 117 mg/dL — AB (ref 65–99)
Glucose-Capillary: 69 mg/dL (ref 65–99)
Glucose-Capillary: 80 mg/dL (ref 65–99)
Glucose-Capillary: 120 mg/dL — ABNORMAL HIGH (ref 65–99)

## 2015-06-17 LAB — APTT: aPTT: 28 seconds (ref 24–37)

## 2015-06-17 LAB — MRSA PCR SCREENING: MRSA by PCR: NEGATIVE

## 2015-06-17 SURGERY — CRANIOTOMY HEMATOMA EVACUATION SUBDURAL
Anesthesia: General | Laterality: Right

## 2015-06-17 MED ORDER — BUPIVACAINE-EPINEPHRINE (PF) 0.5% -1:200000 IJ SOLN
INTRAMUSCULAR | Status: DC | PRN
  Administered 2015-06-17: 7.5 mL

## 2015-06-17 MED ORDER — FOLIC ACID 1 MG PO TABS
1.0000 mg | ORAL_TABLET | Freq: Every day | ORAL | Status: DC
  Administered 2015-06-18: 1 mg via ORAL
  Filled 2015-06-17: qty 1

## 2015-06-17 MED ORDER — HYDROCODONE-ACETAMINOPHEN 5-325 MG PO TABS: 1.0000 | ORAL_TABLET | Freq: Four times a day (QID) | ORAL | Status: DC | PRN

## 2015-06-17 MED ORDER — HYDRALAZINE HCL 20 MG/ML IJ SOLN: 5.0000 mg | INTRAMUSCULAR | Status: DC | PRN

## 2015-06-17 MED ORDER — DEXTROSE 50 % IV SOLN
INTRAVENOUS | Status: AC
  Filled 2015-06-17: qty 50

## 2015-06-17 MED ORDER — METOCLOPRAMIDE HCL 5 MG/ML IJ SOLN: 10.0000 mg | Freq: Once | INTRAMUSCULAR | Status: DC | PRN

## 2015-06-17 MED ORDER — PHENYLEPHRINE HCL 10 MG/ML IJ SOLN
10.0000 mg | INTRAVENOUS | Status: DC | PRN
  Administered 2015-06-17: 20 ug/min via INTRAVENOUS
  Administered 2015-06-17 (×2): 40 ug/min via INTRAVENOUS

## 2015-06-17 MED ORDER — NITROGLYCERIN IN D5W 200-5 MCG/ML-% IV SOLN
0.0000 ug/min | INTRAVENOUS | Status: DC
  Filled 2015-06-17: qty 250

## 2015-06-17 MED ORDER — ROCURONIUM BROMIDE 100 MG/10ML IV SOLN
INTRAVENOUS | Status: DC | PRN
  Administered 2015-06-17: 50 mg via INTRAVENOUS

## 2015-06-17 MED ORDER — ONDANSETRON HCL 4 MG/2ML IJ SOLN: 4.0000 mg | INTRAMUSCULAR | Status: DC | PRN

## 2015-06-17 MED ORDER — SUGAMMADEX SODIUM 200 MG/2ML IV SOLN
INTRAVENOUS | Status: DC | PRN
Start: 2015-06-17 — End: 2015-06-17
  Administered 2015-06-17: 170 mg via INTRAVENOUS

## 2015-06-17 MED ORDER — SODIUM CHLORIDE 0.9 % IV SOLN
0.1500 ug/kg/min | INTRAVENOUS | Status: AC
  Administered 2015-06-17: .2 ug/kg/min via INTRAVENOUS
  Filled 2015-06-17: qty 2000

## 2015-06-17 MED ORDER — THROMBIN 5000 UNITS EX SOLR
CUTANEOUS | Status: DC | PRN
  Administered 2015-06-17: 16:00:00 via TOPICAL

## 2015-06-17 MED ORDER — VITAMIN D 1000 UNITS PO TABS
5000.0000 [IU] | ORAL_TABLET | Freq: Every day | ORAL | Status: DC
  Administered 2015-06-17 – 2015-06-18 (×2): 5000 [IU] via ORAL
  Filled 2015-06-17 (×2): qty 5

## 2015-06-17 MED ORDER — VANCOMYCIN HCL 1000 MG IV SOLR
INTRAVENOUS | Status: DC | PRN
Start: 2015-06-17 — End: 2015-06-17
  Administered 2015-06-17: 1000 mg via TOPICAL

## 2015-06-17 MED ORDER — LIDOCAINE-EPINEPHRINE 2 %-1:100000 IJ SOLN
INTRAMUSCULAR | Status: DC | PRN
  Administered 2015-06-17: 7.5 mL

## 2015-06-17 MED ORDER — BACITRACIN ZINC 500 UNIT/GM EX OINT
TOPICAL_OINTMENT | CUTANEOUS | Status: DC | PRN
  Administered 2015-06-17: 1 via TOPICAL

## 2015-06-17 MED ORDER — PROPOFOL 10 MG/ML IV BOLUS
INTRAVENOUS | Status: AC
  Filled 2015-06-17: qty 20

## 2015-06-17 MED ORDER — SUGAMMADEX SODIUM 200 MG/2ML IV SOLN
INTRAVENOUS | Status: AC
  Filled 2015-06-17: qty 2

## 2015-06-17 MED ORDER — PROPOFOL 10 MG/ML IV BOLUS
INTRAVENOUS | Status: DC | PRN
  Administered 2015-06-17: 50 mg via INTRAVENOUS
  Administered 2015-06-17: 100 mg via INTRAVENOUS

## 2015-06-17 MED ORDER — METFORMIN HCL 500 MG PO TABS
500.0000 mg | ORAL_TABLET | Freq: Every day | ORAL | Status: DC
  Administered 2015-06-18: 500 mg via ORAL
  Filled 2015-06-17: qty 1

## 2015-06-17 MED ORDER — NITROGLYCERIN 0.4 MG SL SUBL
0.4000 mg | SUBLINGUAL_TABLET | SUBLINGUAL | Status: DC | PRN
Start: 2015-06-17 — End: 2015-06-18

## 2015-06-17 MED ORDER — DOCUSATE SODIUM 100 MG PO CAPS
100.0000 mg | ORAL_CAPSULE | Freq: Two times a day (BID) | ORAL | Status: DC
Start: 2015-06-17 — End: 2015-06-18
  Administered 2015-06-17 – 2015-06-18 (×2): 100 mg via ORAL
  Filled 2015-06-17 (×2): qty 1

## 2015-06-17 MED ORDER — SODIUM CHLORIDE 0.9 % IR SOLN
Status: DC | PRN
  Administered 2015-06-17: 17:00:00

## 2015-06-17 MED ORDER — NALOXONE HCL 0.4 MG/ML IJ SOLN: 0.0800 mg | INTRAMUSCULAR | Status: DC | PRN

## 2015-06-17 MED ORDER — FERROUS SULFATE 325 (65 FE) MG PO TABS
325.0000 mg | ORAL_TABLET | Freq: Every day | ORAL | Status: DC
  Administered 2015-06-18: 325 mg via ORAL
  Filled 2015-06-17: qty 1

## 2015-06-17 MED ORDER — SENNA 8.6 MG PO TABS
1.0000 | ORAL_TABLET | Freq: Two times a day (BID) | ORAL | Status: DC
  Administered 2015-06-17 – 2015-06-18 (×2): 8.6 mg via ORAL
  Filled 2015-06-17 (×2): qty 1

## 2015-06-17 MED ORDER — DOCUSATE SODIUM 100 MG PO CAPS: 100.0000 mg | ORAL_CAPSULE | Freq: Two times a day (BID) | ORAL | Status: DC

## 2015-06-17 MED ORDER — HYDROMORPHONE HCL 1 MG/ML IJ SOLN: 0.5000 mg | INTRAMUSCULAR | Status: DC | PRN

## 2015-06-17 MED ORDER — PANTOPRAZOLE SODIUM 40 MG PO TBEC
40.0000 mg | DELAYED_RELEASE_TABLET | Freq: Two times a day (BID) | ORAL | Status: DC
  Administered 2015-06-17 – 2015-06-18 (×2): 40 mg via ORAL
  Filled 2015-06-17 (×2): qty 1

## 2015-06-17 MED ORDER — ADULT MULTIVITAMIN W/MINERALS CH
1.0000 | ORAL_TABLET | Freq: Every day | ORAL | Status: DC
  Administered 2015-06-18: 1 via ORAL
  Filled 2015-06-17 (×2): qty 1

## 2015-06-17 MED ORDER — LIDOCAINE HCL (CARDIAC) 20 MG/ML IV SOLN
INTRAVENOUS | Status: DC | PRN
  Administered 2015-06-17: 100 mg via INTRAVENOUS

## 2015-06-17 MED ORDER — EPHEDRINE SULFATE 50 MG/ML IJ SOLN
INTRAMUSCULAR | Status: DC | PRN
  Administered 2015-06-17: 10 mg via INTRAVENOUS
  Administered 2015-06-17: 15 mg via INTRAVENOUS
  Administered 2015-06-17: 10 mg via INTRAVENOUS
  Administered 2015-06-17: 15 mg via INTRAVENOUS

## 2015-06-17 MED ORDER — SODIUM CHLORIDE 0.9 % IV SOLN
INTRAVENOUS | Status: DC
  Administered 2015-06-17 – 2015-06-18 (×2): via INTRAVENOUS

## 2015-06-17 MED ORDER — FENTANYL CITRATE (PF) 100 MCG/2ML IJ SOLN
INTRAMUSCULAR | Status: DC | PRN
  Administered 2015-06-17: 50 ug via INTRAVENOUS
  Administered 2015-06-17: 150 ug via INTRAVENOUS
  Administered 2015-06-17: 50 ug via INTRAVENOUS

## 2015-06-17 MED ORDER — FENTANYL CITRATE (PF) 250 MCG/5ML IJ SOLN
INTRAMUSCULAR | Status: AC
  Filled 2015-06-17: qty 5

## 2015-06-17 MED ORDER — TAMSULOSIN HCL 0.4 MG PO CAPS
0.4000 mg | ORAL_CAPSULE | Freq: Every day | ORAL | Status: DC
  Administered 2015-06-18: 0.4 mg via ORAL
  Filled 2015-06-17: qty 1

## 2015-06-17 MED ORDER — VANCOMYCIN HCL 1000 MG IV SOLR
INTRAVENOUS | Status: AC
  Filled 2015-06-17: qty 1000

## 2015-06-17 MED ORDER — FENTANYL CITRATE (PF) 100 MCG/2ML IJ SOLN
25.0000 ug | INTRAMUSCULAR | Status: DC | PRN
Start: 2015-06-17 — End: 2015-06-17
  Administered 2015-06-17: 25 ug via INTRAVENOUS
  Administered 2015-06-17: 50 ug via INTRAVENOUS

## 2015-06-17 MED ORDER — DULOXETINE HCL 60 MG PO CPEP
60.0000 mg | ORAL_CAPSULE | Freq: Every day | ORAL | Status: DC
  Administered 2015-06-18: 60 mg via ORAL
  Filled 2015-06-17: qty 1

## 2015-06-17 MED ORDER — SODIUM CHLORIDE 0.9 % IV SOLN
INTRAVENOUS | Status: DC | PRN
  Administered 2015-06-17: 16:00:00 via INTRAVENOUS

## 2015-06-17 MED ORDER — ASPIRIN 325 MG PO TABS
325.0000 mg | ORAL_TABLET | Freq: Every day | ORAL | Status: DC
  Administered 2015-06-18: 325 mg via ORAL
  Filled 2015-06-17: qty 1

## 2015-06-17 MED ORDER — LACTATED RINGERS IV SOLN
INTRAVENOUS | Status: DC
  Administered 2015-06-17 (×2): via INTRAVENOUS

## 2015-06-17 MED ORDER — METOPROLOL TARTRATE 25 MG PO TABS
25.0000 mg | ORAL_TABLET | Freq: Two times a day (BID) | ORAL | Status: DC
  Administered 2015-06-17: 25 mg via ORAL
  Filled 2015-06-17 (×2): qty 1

## 2015-06-17 MED ORDER — BISACODYL 5 MG PO TBEC: 5.0000 mg | DELAYED_RELEASE_TABLET | Freq: Every day | ORAL | Status: DC | PRN

## 2015-06-17 MED ORDER — LEVETIRACETAM 750 MG PO TABS
750.0000 mg | ORAL_TABLET | Freq: Two times a day (BID) | ORAL | Status: DC
  Administered 2015-06-17 – 2015-06-18 (×2): 750 mg via ORAL
  Filled 2015-06-17 (×2): qty 1

## 2015-06-17 MED ORDER — HYDROCODONE-ACETAMINOPHEN 5-325 MG PO TABS: 1.0000 | ORAL_TABLET | ORAL | Status: DC | PRN

## 2015-06-17 MED ORDER — GLIMEPIRIDE 2 MG PO TABS
2.0000 mg | ORAL_TABLET | Freq: Every day | ORAL | Status: DC
  Administered 2015-06-18: 2 mg via ORAL
  Filled 2015-06-17: qty 1

## 2015-06-17 MED ORDER — ATROPINE SULFATE 0.4 MG/ML IJ SOLN
INTRAMUSCULAR | Status: DC | PRN
  Administered 2015-06-17: 0.2 mg via INTRAVENOUS

## 2015-06-17 MED ORDER — CEFAZOLIN SODIUM-DEXTROSE 2-4 GM/100ML-% IV SOLN
2.0000 g | INTRAVENOUS | Status: AC
  Administered 2015-06-17: 2 g via INTRAVENOUS
  Filled 2015-06-17: qty 100

## 2015-06-17 MED ORDER — FUROSEMIDE 20 MG PO TABS
20.0000 mg | ORAL_TABLET | Freq: Every day | ORAL | Status: DC
  Administered 2015-06-18: 20 mg via ORAL
  Filled 2015-06-17: qty 1

## 2015-06-17 MED ORDER — FENTANYL CITRATE (PF) 100 MCG/2ML IJ SOLN
INTRAMUSCULAR | Status: AC
  Filled 2015-06-17: qty 2

## 2015-06-17 MED ORDER — AMLODIPINE BESYLATE 10 MG PO TABS
10.0000 mg | ORAL_TABLET | Freq: Every day | ORAL | Status: DC
  Administered 2015-06-18: 10 mg via ORAL
  Filled 2015-06-17: qty 1

## 2015-06-17 MED ORDER — 0.9 % SODIUM CHLORIDE (POUR BTL) OPTIME
TOPICAL | Status: DC | PRN
  Administered 2015-06-17 (×2): 1000 mL

## 2015-06-17 MED ORDER — VITAMIN B-12 1000 MCG PO TABS
1000.0000 ug | ORAL_TABLET | Freq: Every day | ORAL | Status: DC
  Administered 2015-06-18: 1000 ug via ORAL
  Filled 2015-06-17: qty 1

## 2015-06-17 MED ORDER — TRAZODONE HCL 100 MG PO TABS
100.0000 mg | ORAL_TABLET | Freq: Every day | ORAL | Status: DC
  Administered 2015-06-17: 100 mg via ORAL
  Filled 2015-06-17 (×2): qty 1

## 2015-06-17 MED ORDER — ONDANSETRON HCL 4 MG PO TABS: 4.0000 mg | ORAL_TABLET | ORAL | Status: DC | PRN

## 2015-06-17 MED ORDER — DEXTROSE 50 % IV SOLN
25.0000 mL | Freq: Once | INTRAVENOUS | Status: AC
  Administered 2015-06-17: 25 mL via INTRAVENOUS
  Filled 2015-06-17: qty 50

## 2015-06-17 MED ORDER — ATORVASTATIN CALCIUM 40 MG PO TABS
40.0000 mg | ORAL_TABLET | Freq: Every day | ORAL | Status: DC
  Administered 2015-06-18: 40 mg via ORAL
  Filled 2015-06-17: qty 1

## 2015-06-17 MED ORDER — FLEET ENEMA 7-19 GM/118ML RE ENEM
1.0000 | ENEMA | Freq: Once | RECTAL | Status: DC | PRN
Start: 2015-06-17 — End: 2015-06-18

## 2015-06-17 MED ORDER — MICROFIBRILLAR COLL HEMOSTAT EX PADS
MEDICATED_PAD | CUTANEOUS | Status: DC | PRN
  Administered 2015-06-17: 1 via TOPICAL

## 2015-06-17 MED ORDER — THROMBIN 20000 UNITS EX SOLR
CUTANEOUS | Status: DC | PRN
  Administered 2015-06-17: 17:00:00 via TOPICAL

## 2015-06-17 MED ORDER — POTASSIUM CHLORIDE CRYS ER 20 MEQ PO TBCR
20.0000 meq | EXTENDED_RELEASE_TABLET | Freq: Two times a day (BID) | ORAL | Status: DC
  Administered 2015-06-17 – 2015-06-18 (×2): 20 meq via ORAL
  Filled 2015-06-17 (×2): qty 1

## 2015-06-17 MED ORDER — DEXAMETHASONE SODIUM PHOSPHATE 10 MG/ML IJ SOLN
INTRAMUSCULAR | Status: DC | PRN
  Administered 2015-06-17: 10 mg via INTRAVENOUS

## 2015-06-17 MED ORDER — CEFAZOLIN SODIUM-DEXTROSE 2-4 GM/100ML-% IV SOLN
2.0000 g | Freq: Three times a day (TID) | INTRAVENOUS | Status: AC
  Administered 2015-06-18 (×2): 2 g via INTRAVENOUS
  Filled 2015-06-17 (×2): qty 100

## 2015-06-17 MED ORDER — ONDANSETRON HCL 4 MG/2ML IJ SOLN
INTRAMUSCULAR | Status: DC | PRN
  Administered 2015-06-17: 4 mg via INTRAVENOUS

## 2015-06-17 MED ORDER — QUETIAPINE FUMARATE 200 MG PO TABS
200.0000 mg | ORAL_TABLET | Freq: Every day | ORAL | Status: DC
  Administered 2015-06-17: 200 mg via ORAL
  Filled 2015-06-17: qty 1

## 2015-06-17 MED ORDER — MEPERIDINE HCL 25 MG/ML IJ SOLN: 6.2500 mg | INTRAMUSCULAR | Status: DC | PRN

## 2015-06-17 MED ORDER — IMATINIB MESYLATE 400 MG PO TABS: 400.0000 mg | ORAL_TABLET | Freq: Every day | ORAL | Status: DC

## 2015-06-17 MED ORDER — PNEUMOCOCCAL VAC POLYVALENT 25 MCG/0.5ML IJ INJ
0.5000 mL | INJECTION | INTRAMUSCULAR | Status: AC
  Administered 2015-06-18: 0.5 mL via INTRAMUSCULAR
  Filled 2015-06-17: qty 0.5

## 2015-06-17 MED ORDER — PANTOPRAZOLE SODIUM 40 MG IV SOLR
40.0000 mg | Freq: Every day | INTRAVENOUS | Status: DC
Start: 2015-06-17 — End: 2015-06-17

## 2015-06-17 SURGICAL SUPPLY — 71 items
APL SKNCLS STERI-STRIP NONHPOA (GAUZE/BANDAGES/DRESSINGS)
APPLICATOR CHLORAPREP 3ML ORNG (MISCELLANEOUS) ×3 IMPLANT
BENZOIN TINCTURE PRP APPL 2/3 (GAUZE/BANDAGES/DRESSINGS) IMPLANT
BLADE CLIPPER SURG (BLADE) ×3 IMPLANT
BLADE ULTRA TIP 2M (BLADE) ×1 IMPLANT
BNDG GAUZE ELAST 4 BULKY (GAUZE/BANDAGES/DRESSINGS) IMPLANT
BRUSH SCRUB EZ 1% IODOPHOR (MISCELLANEOUS) ×1 IMPLANT
BUR ACORN 6.0 PRECISION (BURR) ×2 IMPLANT
BUR ACORN 6.0MM PRECISION (BURR) ×1
BUR ADDG 1.1 (BURR) IMPLANT
BUR ADDG 1.1MM (BURR)
BUR MATCHSTICK NEURO 3.0 LAGG (BURR) IMPLANT
BUR SPIRAL ROUTER 2.3 (BUR) ×2 IMPLANT
BUR SPIRAL ROUTER 2.3MM (BUR) ×1
CANISTER SUCT 3000ML PPV (MISCELLANEOUS) ×3 IMPLANT
CATH ROBINSON RED A/P 14FR (CATHETERS) IMPLANT
CLIP TI MEDIUM 6 (CLIP) IMPLANT
DRAIN SNY WOU 7FLT (WOUND CARE) IMPLANT
DRAPE NEUROLOGICAL W/INCISE (DRAPES) ×3 IMPLANT
DRAPE SHEET LG 3/4 BI-LAMINATE (DRAPES) ×6 IMPLANT
DRAPE SURG 17X23 STRL (DRAPES) IMPLANT
DRAPE WARM FLUID 44X44 (DRAPE) ×3 IMPLANT
ELECT REM PT RETURN 9FT ADLT (ELECTROSURGICAL) ×3
ELECTRODE REM PT RTRN 9FT ADLT (ELECTROSURGICAL) ×1 IMPLANT
EVACUATOR 1/8 PVC DRAIN (DRAIN) IMPLANT
EVACUATOR SILICONE 100CC (DRAIN) IMPLANT
GAUZE SPONGE 4X4 12PLY STRL (GAUZE/BANDAGES/DRESSINGS) ×3 IMPLANT
GAUZE SPONGE 4X4 16PLY XRAY LF (GAUZE/BANDAGES/DRESSINGS) IMPLANT
GLOVE BIOGEL PI IND STRL 7.5 (GLOVE) ×1 IMPLANT
GLOVE BIOGEL PI INDICATOR 7.5 (GLOVE) ×2
GLOVE SS BIOGEL STRL SZ 7 (GLOVE) ×2 IMPLANT
GLOVE SUPERSENSE BIOGEL SZ 7 (GLOVE) ×6
GOWN STRL REUS W/ TWL LRG LVL3 (GOWN DISPOSABLE) ×1 IMPLANT
GOWN STRL REUS W/ TWL XL LVL3 (GOWN DISPOSABLE) IMPLANT
GOWN STRL REUS W/TWL 2XL LVL3 (GOWN DISPOSABLE) ×2 IMPLANT
GOWN STRL REUS W/TWL LRG LVL3 (GOWN DISPOSABLE) ×3
GOWN STRL REUS W/TWL XL LVL3 (GOWN DISPOSABLE) ×3
HEMOSTAT POWDER KIT SURGIFOAM (HEMOSTASIS) ×3 IMPLANT
HEMOSTAT SURGICEL 2X14 (HEMOSTASIS) IMPLANT
KIT BASIN OR (CUSTOM PROCEDURE TRAY) ×3 IMPLANT
KIT ROOM TURNOVER OR (KITS) ×3 IMPLANT
MARKER SPHERE PSV REFLC 13MM (MARKER) ×9 IMPLANT
NEEDLE HYPO 21X1.5 SAFETY (NEEDLE) ×6 IMPLANT
NEEDLE HYPO 25X1 1.5 SAFETY (NEEDLE) ×3 IMPLANT
NS IRRIG 1000ML POUR BTL (IV SOLUTION) ×6 IMPLANT
PACK CRANIOTOMY (CUSTOM PROCEDURE TRAY) ×3 IMPLANT
PIN MAYFIELD SKULL DISP (PIN) ×3 IMPLANT
PLATE 1.5  2HOLE LNG NEURO (Plate) ×4 IMPLANT
PLATE 1.5 2HOLE LNG NEURO (Plate) IMPLANT
PLATE 1.5/0.5 18.5MM BURR HOLE (Plate) ×2 IMPLANT
SCREW SELF DRILL HT 1.5/4MM (Screw) ×14 IMPLANT
SPONGE SURGIFOAM ABS GEL 100 (HEMOSTASIS) ×3 IMPLANT
STAPLER VISISTAT 35W (STAPLE) ×3 IMPLANT
STOCKINETTE 6  STRL (DRAPES)
STOCKINETTE 6 STRL (DRAPES) ×1 IMPLANT
SUT ETHILON 3 0 FSL (SUTURE) IMPLANT
SUT ETHILON 3 0 PS 1 (SUTURE) IMPLANT
SUT NURALON 4 0 TR CR/8 (SUTURE) ×4 IMPLANT
SUT VIC AB 0 CT1 18XCR BRD8 (SUTURE) ×1 IMPLANT
SUT VIC AB 0 CT1 8-18 (SUTURE) ×3
SUT VIC AB 2-0 CT1 18 (SUTURE) ×6 IMPLANT
SUT VICRYL RAPIDE 3/0 (SUTURE) ×2 IMPLANT
SYR 30ML LL (SYRINGE) ×9 IMPLANT
TAPE CLOTH SURG 4X10 WHT LF (GAUZE/BANDAGES/DRESSINGS) ×2 IMPLANT
TOWEL OR 17X24 6PK STRL BLUE (TOWEL DISPOSABLE) ×3 IMPLANT
TOWEL OR 17X26 10 PK STRL BLUE (TOWEL DISPOSABLE) ×3 IMPLANT
TRAY FOLEY W/METER SILVER 14FR (SET/KITS/TRAYS/PACK) ×3 IMPLANT
TUBE CONNECTING 12'X1/4 (SUCTIONS) ×1
TUBE CONNECTING 12X1/4 (SUCTIONS) ×2 IMPLANT
UNDERPAD 30X30 INCONTINENT (UNDERPADS AND DIAPERS) ×1 IMPLANT
WATER STERILE IRR 1000ML POUR (IV SOLUTION) ×3 IMPLANT

## 2015-06-17 NOTE — Anesthesia Preprocedure Evaluation (Signed)
Anesthesia Evaluation  Patient identified by MRN, date of birth, ID band Patient awake    Reviewed: Allergy & Precautions, NPO status , Patient's Chart, lab work & pertinent test results  Airway Mallampati: II  TM Distance: >3 FB Neck ROM: Full    Dental no notable dental hx.    Pulmonary sleep apnea ,    Pulmonary exam normal breath sounds clear to auscultation       Cardiovascular hypertension, Pt. on medications and Pt. on home beta blockers + CAD, + Cardiac Stents (2002) and + CABG  Normal cardiovascular exam Rhythm:Regular Rate:Normal     Neuro/Psych Subdural hematoma CVA negative psych ROS   GI/Hepatic negative GI ROS, Neg liver ROS,   Endo/Other  diabetes, Type 2  Renal/GU negative Renal ROS  negative genitourinary   Musculoskeletal negative musculoskeletal ROS (+)   Abdominal   Peds negative pediatric ROS (+)  Hematology negative hematology ROS (+)   Anesthesia Other Findings   Reproductive/Obstetrics negative OB ROS                             Anesthesia Physical Anesthesia Plan  ASA: III  Anesthesia Plan: General   Post-op Pain Management:    Induction: Intravenous  Airway Management Planned: Oral ETT  Additional Equipment: Arterial line  Intra-op Plan:   Post-operative Plan: Extubation in OR  Informed Consent: I have reviewed the patients History and Physical, chart, labs and discussed the procedure including the risks, benefits and alternatives for the proposed anesthesia with the patient or authorized representative who has indicated his/her understanding and acceptance.   Dental advisory given  Plan Discussed with: CRNA  Anesthesia Plan Comments:         Anesthesia Quick Evaluation

## 2015-06-17 NOTE — Brief Op Note (Signed)
06/17/2015  4:54 PM  PATIENT:  [name]  75 y.o. male  PRE-OPERATIVE DIAGNOSIS:  Subdural Hematoma  POST-OPERATIVE DIAGNOSIS:  Subdural Hematoma  PROCEDURE:  Procedure(s) with comments: Right Craniotomy for subdural hematoma with stereotactic navigation (Right) - Right Craniotomy for subdural hematoma with stereotactic navigation APPLICATION OF CRANIAL NAVIGATION (Right)  SURGEON:  Surgeon(s) and Role:    * Tamala Fothergill, MD - Primary    * Kristeen Miss, MD - Assisting  PHYSICIAN ASSISTANT:   ASSISTANTS: Kristeen Miss, MD   ANESTHESIA:   general  EBL:  Total I/O In: 1000 [I.V.:1000] Out: 100 [Urine:50; Blood:50]  BLOOD ADMINISTERED:none  DRAINS: none   LOCAL MEDICATIONS USED:  MARCAINE    and LIDOCAINE   SPECIMEN:  No Specimen  DISPOSITION OF SPECIMEN:  N/A  COUNTS:  YES  TOURNIQUET:  * No tourniquets in log *  DICTATION: .Dragon Dictation  PLAN OF CARE: Admit to inpatient   PATIENT DISPOSITION:  ICU - extubated and stable.   Delay start of Pharmacological VTE agent (>24hrs) due to surgical blood loss or risk of bleeding: yes

## 2015-06-17 NOTE — Transfer of Care (Signed)
Immediate Anesthesia Transfer of Care Note  Patient: [name]  Procedure(s) Performed: Procedure(s) with comments: Right Craniotomy for subdural hematoma with stereotactic navigation (Right) - Right Craniotomy for subdural hematoma with stereotactic navigation APPLICATION OF CRANIAL NAVIGATION (Right)  Patient Location: PACU  Anesthesia Type:General  Level of Consciousness: awake, alert , oriented and patient cooperative  Airway & Oxygen Therapy: Patient Spontanous Breathing and Patient connected to face mask oxygen  Post-op Assessment: Report given to RN, Post -op Vital signs reviewed and stable and Patient moving all extremities X 4  Post vital signs: Reviewed and stable  Last Vitals:  Filed Vitals:   06/17/15 1121  BP: 125/55  Pulse: 63  Temp: 37.2 C  Resp: 18    Last Pain: There were no vitals filed for this visit.    Patients Stated Pain Goal: 1 ([id number])  Complications: No apparent anesthesia complications

## 2015-06-17 NOTE — Anesthesia Procedure Notes (Signed)
Procedure Name: Intubation Date/Time: 06/17/2015 3:17 PM Performed by: Lance Coon Pre-anesthesia Checklist: Patient identified, Timeout performed, Emergency Drugs available, Suction available and Patient being monitored Patient Re-evaluated:Patient Re-evaluated prior to inductionOxygen Delivery Method: Circle system utilized Preoxygenation: Pre-oxygenation with 100% oxygen Intubation Type: IV induction Ventilation: Mask ventilation without difficulty Laryngoscope Size: Miller and 2 Grade View: Grade II Tube type: Oral Tube size: 7.5 mm Number of attempts: 1 Airway Equipment and Method: Stylet Placement Confirmation: ETT inserted through vocal cords under direct vision,  breath sounds checked- equal and bilateral and positive ETCO2 Secured at: 22 cm Tube secured with: Tape Dental Injury: Teeth and Oropharynx as per pre-operative assessment

## 2015-06-17 NOTE — Progress Notes (Signed)
CT called and informed that the patient is ready and can come to CT at anytime now.

## 2015-06-17 NOTE — Progress Notes (Signed)
CT Disc made and sent upstairs to short stay with patient.
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
## 2015-06-17 NOTE — Progress Notes (Signed)
Some airway spasm upon adm to PACU with decreased O2 sat, pt barely opens eyes to command. Oral airway placed by CRNA, Dr Lissa Hoard here, 12 lead EKG done (unchanged from preop). Pt became more responsive within a few minutes, following commands. Will cont to monitor closely

## 2015-06-17 NOTE — Op Note (Signed)
06/17/2015  7:57 PM  PATIENT:  [name]  75 y.o. male  PRE-OPERATIVE DIAGNOSIS:  Chronic subdural hematoma, right  POST-OPERATIVE DIAGNOSIS:  Same  PROCEDURE:  Craniotomy for evacuation of subdural hematoma, right; use of stereotactic navigation  SURGEON:  Aldean Ast, MD  ASSISTANTS: Kristeen Miss, [name]  ANESTHESIA:   General   DRAINS: None   SPECIMEN:  None  INDICATION FOR PROCEDURE: 75 year old African-American man on aspirin and Plavix with progressively enlarging right-sided chronic subdural hematoma with apparent fibrous loculation. Patient understood the risks, benefits, and alternatives and potential outcomes and wished to proceed.  PROCEDURE DETAILS: After smooth induction of general endotracheal anesthesia the patient was positioned on the operative table in [place] pins with his head turned to the left shoulder bump under his right shoulder. BrainLab navigation was registered using facial and extracranial landmarks. The right scalp was clipped of hair and wiped out with alcohol. Stereotactic navigation was used to plan a linear incision over the largest portion of the subdural hematoma. This was marked with a skin marker and then the skin was infiltrated with lidocaine and Marcaine with epinephrine. The patient was prepped and draped in usual sterile fashion.  A linear incision was made at approximately the superior temporal line on the right. Pericranium was elevated and the temporalis was scraped inferiorly using periosteal elevators. Raney clips were applied to the skin edges. Stereotactic navigation was used to again identify the thickest portion of the subdural hematoma. A single burr hole was made at the posterior edge of this. The dura was separated from the skull and a craniotomy was fashioned involving all of the exposed calvarium. Bone bleeding was controlled with bone wax.  The dura was opened sharply. There was thick, dark yellow fluid which was under  pressure. There was some subacute hematoma as well. This was irrigated away. Irrigation was performed in all directions until there was only clear fluid which returned. There was a thick membrane over the surface of the brain. This was opened sharply and then elevated and resected to the limits of the craniotomy. There was some bleeding from a membrane on the inner surface of the dura. This was controlled with bipolar cautery. The dura was then closed with interrupted Vicryl sutures. A thin layer of adherus was applied over the durotomy. I irrigated vigorously. A tack up stitch was placed in the central portion of the dura. This was passed through the craniotomy flap. The craniotomy flap was then fixated to the skull with titanium plates and a burr hole cover posteriorly. The tack up stitch was tied down. I irrigated again and then Vancomycin powder was placed on the bone flap.  The galea was closed with interrupted Vicryl sutures and the scalp was closed with running locked Vicryl suture. The patient was removed from [place] pins and allowed to awaken without compensation.  PATIENT DISPOSITION:  ICU - extubated and stable.   Delay start of Pharmacological VTE agent (>24hrs) due to surgical blood loss or risk of bleeding:  yes

## 2015-06-17 NOTE — Progress Notes (Addendum)
Dr Marcell Barlow called and informed of CBG 69 new orders noted.

## 2015-06-18 DIAGNOSIS — I6203 Nontraumatic chronic subdural hemorrhage: Secondary | ICD-10-CM | POA: Diagnosis not present

## 2015-06-18 LAB — CBC
HCT: 33.7 % — ABNORMAL LOW (ref 39.0–52.0)
HEMOGLOBIN: 11.7 g/dL — AB (ref 13.0–17.0)
MCH: 34 pg (ref 26.0–34.0)
MCHC: 34.7 g/dL (ref 30.0–36.0)
MCV: 98 fL (ref 78.0–100.0)
Platelets: 166 10*3/uL (ref 150–400)
RBC: 3.44 MIL/uL — AB (ref 4.22–5.81)
RDW: 13.8 % (ref 11.5–15.5)
WBC: 12.5 10*3/uL — ABNORMAL HIGH (ref 4.0–10.5)

## 2015-06-18 LAB — BASIC METABOLIC PANEL
ANION GAP: 8 (ref 5–15)
BUN: 15 mg/dL (ref 6–20)
CHLORIDE: 107 mmol/L (ref 101–111)
CO2: 25 mmol/L (ref 22–32)
CREATININE: 1.54 mg/dL — AB (ref 0.61–1.24)
Calcium: 7.4 mg/dL — ABNORMAL LOW (ref 8.9–10.3)
GFR calc non Af Amer: 43 mL/min — ABNORMAL LOW (ref >60)
GFR, EST AFRICAN AMERICAN: 50 mL/min — AB (ref >60)
Glucose, Bld: 388 mg/dL — ABNORMAL HIGH (ref 65–99)
Potassium: 3.2 mmol/L — ABNORMAL LOW (ref 3.5–5.1)
Sodium: 140 mmol/L (ref 135–145)

## 2015-06-18 MED ORDER — HYDROCODONE-ACETAMINOPHEN 5-325 MG PO TABS: 1.0000 | ORAL_TABLET | ORAL | Status: AC | PRN

## 2015-06-18 MED ORDER — CETYLPYRIDINIUM CHLORIDE 0.05 % MT LIQD: 7.0000 mL | Freq: Two times a day (BID) | OROMUCOSAL | Status: DC

## 2015-06-18 NOTE — Progress Notes (Signed)
Pt throwing multiple PVCs with bigeminy and in BBB.  Notified Dr. Cyndy Freeze of this.  MD OK for PT to work with. Still OK to potentially D/C to home it PT gives the go ahead.  Will continue to monitor pt.

## 2015-06-18 NOTE — Progress Notes (Signed)
Been waiting on pt to void all day.  Bladder scan 378ml.  Pt finally voided 285ml.  Notified Dr. Annette Stable of this.  OK to discharge home.

## 2015-06-18 NOTE — Progress Notes (Signed)
No acute events Awake and alert Oriented x3 Moving all extremities well, no drift Bandage c/d/i Stable Hopefully d/c from ICU, otherwise TTF

## 2015-06-18 NOTE — Anesthesia Postprocedure Evaluation (Signed)
Anesthesia Post Note  Patient: [name]  Procedure(s) Performed: Procedure(s) (LRB): Right Craniotomy for subdural hematoma with stereotactic navigation (Right) APPLICATION OF CRANIAL NAVIGATION (Right)  Patient location during evaluation: PACU Anesthesia Type: General Level of consciousness: sedated and patient cooperative Pain management: pain level controlled Vital Signs Assessment: post-procedure vital signs reviewed and stable Respiratory status: spontaneous breathing Cardiovascular status: stable Anesthetic complications: no    Last Vitals:  Filed Vitals:   06/18/15 0700 06/18/15 0800  BP: 127/55 140/59  Pulse: 73 66  Temp:  36.4 C  Resp: 20 13    Last Pain:  Filed Vitals:   06/18/15 0926  PainSc: Asleep                 Nolon Nations

## 2015-06-18 NOTE — Evaluation (Signed)
Physical Therapy Evaluation Patient Details Name: [name] MRN: [id number] DOB: 22-Feb-1940 Today's Date: 06/18/2015   History of Present Illness  75 yo male s/p R craniotomy for evacuation of SDH PMH: CVA, DM2, CAD, depression, memory loss, arthritis, GERD, SDH 04/2015, HA, bil cataract removal,  Clinical Impression  Patient demonstrates deficits in functional mobility as indicated below. Will need continued skilled PT to address deficits and maximize function. Will see as indicated and progress as tolerated.    Follow Up Recommendations Home health PT;Supervision - Intermittent    Equipment Recommendations       Recommendations for Other Services       Precautions / Restrictions Precautions Precautions: Fall Precaution Comments: watch HR      Mobility  Bed Mobility Overal bed mobility: Needs Assistance Bed Mobility: Supine to Sit     Supine to sit: Mod assist     General bed mobility comments: received in chair  Transfers Overall transfer level: Needs assistance Equipment used: Rolling walker (2 wheeled) Transfers: Sit to/from Stand Sit to Stand: Min guard         General transfer comment: VCs for hand placement, min guard for stability  Ambulation/Gait Ambulation/Gait assistance: Supervision Ambulation Distance (Feet): 210 Feet Assistive device: Rolling walker (2 wheeled) Gait Pattern/deviations: Decreased stride length;Narrow base of support Gait velocity: decreased Gait velocity interpretation: Below normal speed for age/gender General Gait Details: slow gait speed but overall steady with use of RW  Stairs Stairs: Yes Stairs assistance: Min guard Stair Management: One rail Right Number of Stairs: 5 General stair comments: up forwards down backwards, steady, no physical assist required, min guard for line management and safety  Wheelchair Mobility    Modified Rankin (Stroke Patients Only)       Balance Overall balance assessment: Needs  assistance Sitting-balance support: Bilateral upper extremity supported;Feet supported Sitting balance-Leahy Scale: Fair     Standing balance support: Single extremity supported;During functional activity Standing balance-Leahy Scale: Fair                               Pertinent Vitals/Pain Pain Assessment: No/denies pain    Home Living Family/patient expects to be discharged to:: Private residence Living Arrangements: Alone (son in law stays sometimes) Available Help at Discharge: Available PRN/intermittently;Family Type of Home: House Home Access: Stairs to enter Entrance Stairs-Rails: Psychiatric nurse of Steps: 4-5 Home Layout: One level Home Equipment: Environmental consultant - 2 wheels;Shower seat;Bedside commode;Cane - single point Additional Comments: son in law drives to all appointments    Prior Function Level of Independence: Independent with assistive device(s)               Hand Dominance   Dominant Hand: Right    Extremity/Trunk Assessment   Upper Extremity Assessment: Overall WFL for tasks assessed           Lower Extremity Assessment: Overall WFL for tasks assessed      Cervical / Trunk Assessment: Normal  Communication   Communication: No difficulties  Cognition Arousal/Alertness: Awake/alert Behavior During Therapy: WFL for tasks assessed/performed Overall Cognitive Status: Within Functional Limits for tasks assessed                      General Comments General comments (skin integrity, edema, etc.): aline R hand    Exercises        Assessment/Plan    PT Assessment Patient needs continued PT services  PT  Diagnosis Difficulty walking   PT Problem List Decreased activity tolerance;Decreased balance;Decreased mobility  PT Treatment Interventions DME instruction;Gait training;Stair training;Functional mobility training;Therapeutic activities;Therapeutic exercise;Balance training;Patient/family education   PT  Goals (Current goals can be found in the Care Plan section) Acute Rehab PT Goals Patient Stated Goal: to go home PT Goal Formulation: With patient Time For Goal Achievement: 07/02/15 Potential to Achieve Goals: Good    Frequency Min 3X/week   Barriers to discharge Decreased caregiver support      Co-evaluation               End of Session Equipment Utilized During Treatment: Gait belt Activity Tolerance: Patient tolerated treatment well Patient left: in chair;with call bell/phone within reach Nurse Communication: Mobility status         Time: [id number] PT Time Calculation (min) (ACUTE ONLY): 23 min   Charges:   PT Evaluation $PT Eval Moderate Complexity: 1 Procedure PT Treatments $Gait Training: 8-22 mins   PT G CodesDuncan Dull Jul 04, 2015, 10:09 AM Alben Deeds, PT DPT  [phone]

## 2015-06-18 NOTE — Plan of Care (Signed)
Problem: Tissue Perfusion: Goal: Risk factors for ineffective tissue perfusion will decrease Outcome: Completed/Met Date Met:  06/18/15 SCDs applied for VTE prophylaxis while pt is in bed.

## 2015-06-18 NOTE — Discharge Summary (Signed)
Date of Admission: 06/17/15  Date of Discharge: 06/18/15  Admission Diagnosis: Chronic subdural hematoma, right  Discharge Diagnosis: Same  Procedure Performed: Right craniotomy for evacuation of subdural hematoma  Attending: [facility] Course:  The patient was admitted for the above operation.  He tolerated this well.  He had an uneventful post-op course and was discharged home in stable condition.   Discharged Medications: Resume prior meds   Follow up: With me in 3 weeks

## 2015-06-18 NOTE — Progress Notes (Signed)
Occupational Therapy Treatment Patient Details Name: [name] MRN: [id number] DOB: 09-19-1940 Today's Date: 06/18/2015    History of present illness 75 yo male s/p R craniotomy for evacuation of SDH PMH: CVA, DM2, CAD, depression, memory loss, arthritis, GERD, SDH 04/2015, HA, bil cataract removal,   OT comments  Pt demonstrates HR 110 and HR 60-70 at rest. Pt without any pain or dizziness with transfer. Pt unsteady with transfer from OOB to chair. Pt with bil Ue extended in a guarded position. Pt static sitting in chair at this time. Pt on 2 L oxygen at end of session. Pt will require family (A) 24/7 today if patient discharges. Pt unsteady at this time. Spoke with PT [facility] regarding possible d/c and balance deficits during OT session.     Follow Up Recommendations  Home health OT    Equipment Recommendations  None recommended by OT    Recommendations for Other Services      Precautions / Restrictions Precautions Precautions: Fall Precaution Comments: watch HR       Mobility Bed Mobility Overal bed mobility: Needs Assistance Bed Mobility: Supine to Sit     Supine to sit: Mod assist     General bed mobility comments: requires (A) to sequence task and guard lines during transfer  Transfers                 General transfer comment: not assessed due to RN request to remain bed level until clarification    Balance Overall balance assessment: Needs assistance Sitting-balance support: Bilateral upper extremity supported;Feet supported Sitting balance-Leahy Scale: Fair     Standing balance support: Single extremity supported;During functional activity Standing balance-Leahy Scale: Fair                     ADL Overall ADL's : Needs assistance/impaired Eating/Feeding: Set up;Bed level   Grooming: Oral care;Wash/dry face;Set up;Bed level Grooming Details (indicate cue type and reason): somewhat limited by R UE with aline block in place              Lower Body Dressing: Maximal assistance;Bed level Lower Body Dressing Details (indicate cue type and reason): don socks Toilet Transfer: Minimal assistance Toilet Transfer Details (indicate cue type and reason): requires hand held (A)          Functional mobility during ADLs: Minimal assistance General ADL Comments: Pt with bil Ue extended for guarding with transfer. MD Ditty reports to mobilize patient to RN Valetta Fuller. Ot helping patient complete bed mobility and basic transfer thise session. Pt with 2L O2 oxygen 88%-95% during session. pt able to static sit RA 93%.       Vision                     Perception     Praxis      Cognition   Behavior During Therapy: WFL for tasks assessed/performed Overall Cognitive Status: Within Functional Limits for tasks assessed                       Extremity/Trunk Assessment  Upper Extremity Assessment Upper Extremity Assessment: Overall WFL for tasks assessed   Lower Extremity Assessment Lower Extremity Assessment: Overall WFL for tasks assessed   Cervical / Trunk Assessment Cervical / Trunk Assessment: Normal    Exercises     Shoulder Instructions       General Comments      Pertinent Vitals/ Pain  Pain Assessment: No/denies pain  Home Living Family/patient expects to be discharged to:: Private residence Living Arrangements: Alone (son in law stays sometimes) Available Help at Discharge: Available PRN/intermittently;Family Type of Home: House Home Access: Stairs to enter [employer] of Steps: 4-5 Entrance Stairs-Rails: Right;Left Home Layout: One level     Bathroom Shower/Tub: Occupational psychologist: [place]: Environmental consultant - 2 wheels;Shower seat;Bedside commode;Cane - single point   Additional Comments: son in law drives to all appointments      Prior Functioning/Environment Level of Independence: Independent with assistive device(s)             Frequency Min 2X/week     Progress Toward Goals  OT Goals(current goals can now be found in the care plan section)  Progress towards OT goals: Progressing toward goals  Acute Rehab OT Goals Patient Stated Goal: to get some ice - summer and winter I love ice OT Goal Formulation: With patient Time For Goal Achievement: 07/02/15 Potential to Achieve Goals: Good ADL Goals Pt Will Perform Grooming: with set-up;sitting Pt Will Perform Upper Body Dressing: with set-up;sitting Pt Will Perform Lower Body Dressing: with set-up;sit to/from stand Additional ADL Goal #1: Pt will complete basic transfer supervision level  Plan Discharge plan remains appropriate    Co-evaluation                 End of Session Equipment Utilized During Treatment: Gait belt;Oxygen   Activity Tolerance Patient tolerated treatment well   Patient Left in chair;with call bell/phone within reach;with chair alarm set   Nurse Communication Mobility status;Precautions        Time: [phone] OT Time Calculation (min): 20 min  Charges: OT General Charges $OT Visit: 1 Procedure OT Evaluation $OT Eval High Complexity: 1 Procedure OT Treatments $Therapeutic Activity: 8-22 mins  Parke Poisson B 06/18/2015, 8:36 AM   Jeri Modena   OTR/L Pager: [phone] Office: [phone] .

## 2015-06-18 NOTE — Evaluation (Signed)
Occupational Therapy Evaluation Patient Details Name: [name] MRN: [id number] DOB: [birth year] Today's Date: 06/18/2015    History of Present Illness 75 yo male s/p R craniotomy for evacuation of SDH PMH: CVA, DM2, CAD, depression, memory loss, arthritis, GERD, SDH 04/2015, HA, bil cataract removal,   Clinical Impression   Patient is s/p R craniotomy SDH surgery resulting in functional limitations due to the deficits listed below (see OT problem list). PTA had several falls and has (A) from son in law PRN.  Patient will benefit from skilled OT acutely to increase independence and safety with ADLS to allow discharge [place]. Session limited by Tanna Furry and RN request to remain bed level at this time.      Follow Up Recommendations  Home health OT (pending progress currently with VTACH / PVC HR)    Equipment Recommendations  None recommended by OT    Recommendations for Other Services       Precautions / Restrictions Precautions Precautions: Fall Precaution Comments: watch HR      Mobility Bed Mobility Overal bed mobility: Needs Assistance;+2 for physical assistance             General bed mobility comments: scoot toward HOB with total+2 MAx (A). pt able to bend bil knee flexion   Transfers                 General transfer comment: not assessed due to RN request to remain bed level until clarification    Balance                                            ADL Overall ADL's : Needs assistance/impaired Eating/Feeding: Set up;Bed level   Grooming: Oral care;Wash/dry face;Set up;Bed level Grooming Details (indicate cue type and reason): somewhat limited by R UE with aline block in place             Lower Body Dressing: Maximal assistance;Bed level Lower Body Dressing Details (indicate cue type and reason): don socks               General ADL Comments: Pt on arrival with 4 L oxygen and supine in bed with run of Vtach. RN notified  and secretary alerted to check patient prior to mobilization. pt with abnormal HR at this time. RN Valetta Fuller requesting patient remain supine until further clarifcation. Oral care complete bed level      Vision Vision Assessment?: No apparent visual deficits   Perception     Praxis      Pertinent Vitals/Pain Pain Assessment: No/denies pain     Hand Dominance Right   Extremity/Trunk Assessment Upper Extremity Assessment Upper Extremity Assessment: Overall WFL for tasks assessed   Lower Extremity Assessment Lower Extremity Assessment: Overall WFL for tasks assessed   Cervical / Trunk Assessment Cervical / Trunk Assessment: Normal   Communication Communication Communication: No difficulties   Cognition Arousal/Alertness: Awake/alert Behavior During Therapy: WFL for tasks assessed/performed Overall Cognitive Status: Within Functional Limits for tasks assessed                     General Comments       Exercises       Shoulder Instructions      Home Living Family/patient expects to be discharged to:: Private residence Living Arrangements: Alone (son in law stays sometimes) Available Help at Discharge: Available PRN/intermittently;Family  Type of Home: House Home Access: Stairs to enter [employer] of Steps: 4-5 Entrance Stairs-Rails: Right;Left Home Layout: One level     Bathroom Shower/Tub: Occupational psychologist: Standard     Home Equipment: Environmental consultant - 2 wheels;Shower seat;Bedside commode;Cane - single point   Additional Comments: son in law drives to all appointments      Prior Functioning/Environment Level of Independence: Independent with assistive device(s)             OT Diagnosis: Generalized weakness;Acute pain   OT Problem List: Decreased strength;Decreased activity tolerance;Impaired balance (sitting and/or standing);Decreased safety awareness;Decreased knowledge of use of DME or AE;Decreased knowledge of  precautions;Cardiopulmonary status limiting activity;Obesity   OT Treatment/Interventions: Self-care/ADL training;Therapeutic exercise;DME and/or AE instruction;Therapeutic activities;Patient/family education;Balance training    OT Goals(Current goals can be found in the care plan section) Acute Rehab OT Goals Patient Stated Goal: to get some grape juice OT Goal Formulation: With patient Time For Goal Achievement: 07/02/15 Potential to Achieve Goals: Good  OT Frequency: Min 2X/week   Barriers to D/C: Decreased caregiver support  PRN caregiver support       Co-evaluation              End of Session Equipment Utilized During Treatment: Gait belt Nurse Communication: Mobility status;Precautions  Activity Tolerance: Patient tolerated treatment well Patient left: in bed;with call bell/phone within reach;with bed alarm set   Time: 0731-0750 OT Time Calculation (min): 19 min Charges:  OT General Charges $OT Visit: 1 Procedure OT Evaluation $OT Eval High Complexity: 1 Procedure G-Codes:    Peri Maris 2015-07-09, 8:30 AM  Jeri Modena   OTR/L [phone] Office: [phone] .

## 2015-06-18 NOTE — Care Management Note (Signed)
Case Management Note  Patient Details  Name: [name] MRN: [id number] Date of Birth: [birth year]  Subjective/Objective:     Pt admitted on 06/17/15 s/p craniotomy for chronic SDH.  PTA, pt independent of ADLS.                Action/Plan: Pt for dc home today.  PT/OT recommending home therapies, and orders received.  Referral to [facility], per pt choice.  Start of care 24-48h post dc date.  Pt's friend, Shirlean Mylar, to assist with care at dc.    Expected Discharge Date:  06/18/2015 Expected Discharge Plan:  [place]  In-House Referral:     Discharge planning Services  CM Consult  Post Acute Care Choice:  Home Health Choice offered to:   Patient  DME Arranged:    DME Agency:  [facility]  HH Arranged:  PT, OT [place] Agency:     Status of Service:  Completed, signed off  Medicare Important Message Given:    Date Medicare IM Given:    Medicare IM give by:    Date Additional Medicare IM Given:    Additional Medicare Important Message give by:     If discussed at [place] of Stay Meetings, dates discussed:    Additional Comments:  Reinaldo Raddle, RN, BSN  Trauma/Neuro ICU Case Manager [phone]

## 2015-06-20 ENCOUNTER — Encounter (HOSPITAL_COMMUNITY): Payer: Self-pay | Admitting: Neurological Surgery

## 2015-06-27 ENCOUNTER — Other Ambulatory Visit: Payer: Self-pay | Admitting: Oncology

## 2015-06-28 ENCOUNTER — Other Ambulatory Visit: Payer: Self-pay | Admitting: Oncology

## 2015-06-28 MED FILL — GLEEVEC 400 MG TABLET: 400 | 30 days supply | Qty: 30 | Fill #0

## 2015-07-23 ENCOUNTER — Encounter: Payer: Self-pay | Admitting: *Deleted

## 2015-07-23 NOTE — Progress Notes (Signed)
Phone call received from patient inquiring when he should stop his [place].  Per Dr. Gearldine Shown note, last dose will be on 07/26/2015.  Pt informed of MD information and appreciative of return phone call.  Pt verbalizes an understanding to return as scheduled on 08/27/15.

## 2015-07-25 ENCOUNTER — Emergency Department (HOSPITAL_COMMUNITY): Payer: Medicare Other

## 2015-07-25 ENCOUNTER — Other Ambulatory Visit: Payer: Self-pay

## 2015-07-25 ENCOUNTER — Other Ambulatory Visit: Payer: Self-pay | Admitting: Neurological Surgery

## 2015-07-25 ENCOUNTER — Inpatient Hospital Stay (HOSPITAL_COMMUNITY)
Admission: EM | Admit: 2015-07-25 | Discharge: 2015-08-17 | DRG: 082 | Disposition: E | Payer: Medicare Other | Attending: Pulmonary Disease | Admitting: Pulmonary Disease

## 2015-07-25 ENCOUNTER — Encounter (HOSPITAL_COMMUNITY): Payer: Self-pay | Admitting: *Deleted

## 2015-07-25 DIAGNOSIS — R402212 Coma scale, best verbal response, none, at arrival to emergency department: Secondary | ICD-10-CM | POA: Diagnosis present

## 2015-07-25 DIAGNOSIS — R402432 Glasgow coma scale score 3-8, at arrival to emergency department: Secondary | ICD-10-CM | POA: Diagnosis not present

## 2015-07-25 DIAGNOSIS — G9340 Encephalopathy, unspecified: Secondary | ICD-10-CM | POA: Diagnosis not present

## 2015-07-25 DIAGNOSIS — Y92009 Unspecified place in unspecified non-institutional (private) residence as the place of occurrence of the external cause: Secondary | ICD-10-CM | POA: Diagnosis not present

## 2015-07-25 DIAGNOSIS — I10 Essential (primary) hypertension: Secondary | ICD-10-CM | POA: Diagnosis present

## 2015-07-25 DIAGNOSIS — R402312 Coma scale, best motor response, none, at arrival to emergency department: Secondary | ICD-10-CM | POA: Diagnosis present

## 2015-07-25 DIAGNOSIS — Z8673 Personal history of transient ischemic attack (TIA), and cerebral infarction without residual deficits: Secondary | ICD-10-CM | POA: Diagnosis not present

## 2015-07-25 DIAGNOSIS — Z515 Encounter for palliative care: Secondary | ICD-10-CM | POA: Diagnosis present

## 2015-07-25 DIAGNOSIS — J9601 Acute respiratory failure with hypoxia: Secondary | ICD-10-CM | POA: Diagnosis not present

## 2015-07-25 DIAGNOSIS — I4510 Unspecified right bundle-branch block: Secondary | ICD-10-CM | POA: Diagnosis present

## 2015-07-25 DIAGNOSIS — I6200 Nontraumatic subdural hemorrhage, unspecified: Secondary | ICD-10-CM

## 2015-07-25 DIAGNOSIS — G9382 Brain death: Secondary | ICD-10-CM | POA: Diagnosis not present

## 2015-07-25 DIAGNOSIS — W1830XA Fall on same level, unspecified, initial encounter: Secondary | ICD-10-CM | POA: Diagnosis present

## 2015-07-25 DIAGNOSIS — I959 Hypotension, unspecified: Secondary | ICD-10-CM | POA: Diagnosis not present

## 2015-07-25 DIAGNOSIS — S065XAA Traumatic subdural hemorrhage with loss of consciousness status unknown, initial encounter: Secondary | ICD-10-CM | POA: Diagnosis present

## 2015-07-25 DIAGNOSIS — R402433 Glasgow coma scale score 3-8, at hospital admission: Secondary | ICD-10-CM | POA: Diagnosis not present

## 2015-07-25 DIAGNOSIS — G935 Compression of brain: Secondary | ICD-10-CM | POA: Diagnosis present

## 2015-07-25 DIAGNOSIS — Z66 Do not resuscitate: Secondary | ICD-10-CM | POA: Diagnosis not present

## 2015-07-25 DIAGNOSIS — R4189 Other symptoms and signs involving cognitive functions and awareness: Secondary | ICD-10-CM

## 2015-07-25 DIAGNOSIS — G9349 Other encephalopathy: Secondary | ICD-10-CM | POA: Diagnosis present

## 2015-07-25 DIAGNOSIS — F430 Acute stress reaction: Secondary | ICD-10-CM | POA: Diagnosis not present

## 2015-07-25 DIAGNOSIS — M5416 Radiculopathy, lumbar region: Secondary | ICD-10-CM

## 2015-07-25 DIAGNOSIS — S065X9A Traumatic subdural hemorrhage with loss of consciousness of unspecified duration, initial encounter: Secondary | ICD-10-CM | POA: Diagnosis present

## 2015-07-25 DIAGNOSIS — Z978 Presence of other specified devices: Secondary | ICD-10-CM

## 2015-07-25 DIAGNOSIS — Z885 Allergy status to narcotic agent status: Secondary | ICD-10-CM | POA: Diagnosis not present

## 2015-07-25 DIAGNOSIS — J9600 Acute respiratory failure, unspecified whether with hypoxia or hypercapnia: Secondary | ICD-10-CM | POA: Diagnosis not present

## 2015-07-25 DIAGNOSIS — R402112 Coma scale, eyes open, never, at arrival to emergency department: Secondary | ICD-10-CM | POA: Diagnosis present

## 2015-07-25 DIAGNOSIS — S065X1A Traumatic subdural hemorrhage with loss of consciousness of 30 minutes or less, initial encounter: Secondary | ICD-10-CM | POA: Diagnosis present

## 2015-07-25 LAB — PROTIME-INR
INR: 0.92 (ref 0.00–1.49)
PROTHROMBIN TIME: 12.6 s (ref 11.6–15.2)

## 2015-07-25 LAB — RAPID URINE DRUG SCREEN, HOSP PERFORMED
Amphetamines: NOT DETECTED
BARBITURATES: NOT DETECTED
Benzodiazepines: NOT DETECTED
COCAINE: NOT DETECTED
Opiates: NOT DETECTED
TETRAHYDROCANNABINOL: NOT DETECTED

## 2015-07-25 LAB — URINALYSIS, ROUTINE W REFLEX MICROSCOPIC
BILIRUBIN URINE: NEGATIVE
Glucose, UA: NEGATIVE mg/dL
KETONES UR: NEGATIVE mg/dL
Leukocytes, UA: NEGATIVE
Nitrite: NEGATIVE
PROTEIN: 30 mg/dL — AB
SPECIFIC GRAVITY, URINE: 1.015 (ref 1.005–1.030)
pH: 5.5 (ref 5.0–8.0)

## 2015-07-25 LAB — CBC WITH DIFFERENTIAL/PLATELET
Basophils Absolute: 0 10*3/uL (ref 0.0–0.1)
Basophils Relative: 0 %
Eosinophils Absolute: 0.1 10*3/uL (ref 0.0–0.7)
Eosinophils Relative: 1 %
HEMATOCRIT: 36.7 % — AB (ref 39.0–52.0)
HEMOGLOBIN: 12.8 g/dL — AB (ref 13.0–17.0)
LYMPHS ABS: 4.1 10*3/uL — AB (ref 0.7–4.0)
Lymphocytes Relative: 43 %
MCH: 34.3 pg — AB (ref 26.0–34.0)
MCHC: 34.9 g/dL (ref 30.0–36.0)
MCV: 98.4 fL (ref 78.0–100.0)
MONO ABS: 0.7 10*3/uL (ref 0.1–1.0)
MONOS PCT: 8 %
NEUTROS ABS: 4.7 10*3/uL (ref 1.7–7.7)
NEUTROS PCT: 48 %
Platelets: 169 10*3/uL (ref 150–400)
RBC: 3.73 MIL/uL — ABNORMAL LOW (ref 4.22–5.81)
RDW: 13.8 % (ref 11.5–15.5)
WBC: 9.7 10*3/uL (ref 4.0–10.5)

## 2015-07-25 LAB — I-STAT ARTERIAL BLOOD GAS, ED
ACID-BASE EXCESS: 8 mmol/L — AB (ref 0.0–2.0)
BICARBONATE: 30.9 meq/L — AB (ref 20.0–24.0)
O2 Saturation: 100 %
TCO2: 32 mmol/L (ref 0–100)
pCO2 arterial: 35.2 mmHg (ref 35.0–45.0)
pH, Arterial: 7.549 — ABNORMAL HIGH (ref 7.350–7.450)
pO2, Arterial: 491 mmHg — ABNORMAL HIGH (ref 80.0–100.0)

## 2015-07-25 LAB — COMPREHENSIVE METABOLIC PANEL
ALK PHOS: 56 U/L (ref 38–126)
ALT: 14 U/L — ABNORMAL LOW (ref 17–63)
ANION GAP: 7 (ref 5–15)
AST: 39 U/L (ref 15–41)
Albumin: 3.7 g/dL (ref 3.5–5.0)
BILIRUBIN TOTAL: 0.7 mg/dL (ref 0.3–1.2)
BUN: 10 mg/dL (ref 6–20)
CALCIUM: 8.8 mg/dL — AB (ref 8.9–10.3)
CO2: 29 mmol/L (ref 22–32)
Chloride: 107 mmol/L (ref 101–111)
Creatinine, Ser: 1.75 mg/dL — ABNORMAL HIGH (ref 0.61–1.24)
GFR, EST AFRICAN AMERICAN: 43 mL/min — AB (ref >60)
GFR, EST NON AFRICAN AMERICAN: 37 mL/min — AB (ref >60)
Glucose, Bld: 145 mg/dL — ABNORMAL HIGH (ref 65–99)
POTASSIUM: 3.2 mmol/L — AB (ref 3.5–5.1)
Sodium: 143 mmol/L (ref 135–145)
TOTAL PROTEIN: 6.4 g/dL — AB (ref 6.5–8.1)

## 2015-07-25 LAB — ETHANOL

## 2015-07-25 LAB — SALICYLATE LEVEL: Salicylate Lvl: <4 mg/dL (ref 2.8–30.0)

## 2015-07-25 LAB — URINE MICROSCOPIC-ADD ON

## 2015-07-25 LAB — I-STAT CG4 LACTIC ACID, ED: LACTIC ACID, VENOUS: 1.88 mmol/L (ref 0.5–2.0)

## 2015-07-25 LAB — ACETAMINOPHEN LEVEL: Acetaminophen (Tylenol), Serum: <10 ug/mL — ABNORMAL LOW (ref 10–30)

## 2015-07-25 MED ORDER — SODIUM CHLORIDE 0.9 % IV SOLN: 250.0000 mL | INTRAVENOUS | Status: DC | PRN

## 2015-07-25 MED ORDER — ROCURONIUM BROMIDE 50 MG/5ML IV SOLN
INTRAVENOUS | Status: DC | PRN
  Administered 2015-07-25: 80 mg via INTRAVENOUS

## 2015-07-25 MED ORDER — MANNITOL 25 % IV SOLN
25.0000 g | Freq: Once | INTRAVENOUS | Status: AC
Start: 2015-07-25 — End: 2015-07-25
  Administered 2015-07-25: 25 g via INTRAVENOUS
  Filled 2015-07-25: qty 100

## 2015-07-25 MED ORDER — FAMOTIDINE IN NACL 20-0.9 MG/50ML-% IV SOLN
20.0000 mg | Freq: Every day | INTRAVENOUS | Status: DC
  Administered 2015-07-26: 20 mg via INTRAVENOUS
  Filled 2015-07-25: qty 50

## 2015-07-25 MED ORDER — PROPOFOL 1000 MG/100ML IV EMUL
5.0000 ug/kg/min | Freq: Once | INTRAVENOUS | Status: AC
Start: 2015-07-25 — End: 2015-07-25
  Administered 2015-07-25: 20 ug/kg/min via INTRAVENOUS

## 2015-07-25 MED ORDER — CHLORHEXIDINE GLUCONATE 0.12% ORAL RINSE (MEDLINE KIT)
15.0000 mL | Freq: Two times a day (BID) | OROMUCOSAL | Status: DC
  Administered 2015-07-25 – 2015-07-28 (×6): 15 mL via OROMUCOSAL

## 2015-07-25 MED ORDER — ANTISEPTIC ORAL RINSE SOLUTION (CORINZ): 7.0000 mL | Freq: Four times a day (QID) | OROMUCOSAL | Status: DC

## 2015-07-25 MED ORDER — PROPOFOL 1000 MG/100ML IV EMUL
INTRAVENOUS | Status: AC
  Administered 2015-07-25: 20 ug/kg/min via INTRAVENOUS
  Filled 2015-07-25: qty 100

## 2015-07-25 MED ORDER — ANTISEPTIC ORAL RINSE SOLUTION (CORINZ)
7.0000 mL | OROMUCOSAL | Status: DC
  Administered 2015-07-25 – 2015-07-26 (×4): 7 mL via OROMUCOSAL

## 2015-07-25 MED ORDER — ALBUTEROL SULFATE (2.5 MG/3ML) 0.083% IN NEBU: 2.5000 mg | INHALATION_SOLUTION | RESPIRATORY_TRACT | Status: DC | PRN

## 2015-07-25 MED ORDER — LIDOCAINE HCL (CARDIAC) 20 MG/ML IV SOLN
INTRAVENOUS | Status: DC | PRN
  Administered 2015-07-25: 100 mg via INTRAVENOUS

## 2015-07-25 MED ORDER — ETOMIDATE 2 MG/ML IV SOLN
INTRAVENOUS | Status: DC | PRN
  Administered 2015-07-25: 24 mg via INTRAVENOUS

## 2015-07-25 NOTE — ED Notes (Signed)
Pressure dropped after propofol started.  Propofol paused.

## 2015-07-25 NOTE — Progress Notes (Signed)
RT transported to CT and back without any complications.

## 2015-07-25 NOTE — ED Notes (Signed)
EDP and nurse attempted several times to insert OGT but unable to insert tube .

## 2015-07-25 NOTE — Progress Notes (Signed)
   08/16/2015 2223  Clinical Encounter Type  Visited With Patient and family together;Health care provider  Visit Type Initial;Critical Care;ED;Spiritual support  Referral From Nurse  Spiritual Encounters  Spiritual Needs Prayer;Emotional  Stress Factors  Family Stress Factors Health changes   Chaplain responded to support the patient's family in the ED. Patient is critically ill. Chaplain was present during medical consultation from Dr. Lake Bells. Chaplain offered prayer and support, and helped patient's family transition to the [facility] waiting area.  Jeri Lager, Chaplain 08/06/2015 10:25 PM

## 2015-07-25 NOTE — Consult Note (Signed)
Reason for Consult: GCS 3 Referring Physician: [name] is an 75 y.o. male.  HPI:75 year old male was having are given with a family member when he fell back and struck his head. He has been unresponsive since then. Episode occurred about 30 minutes before arriving in the ED. Of note, he did have surgery for blood on his brain about a month ago by Dr. Cyndy [name] on May 1.  He did well after that surgery, which was performed for an enlarging chronic SDH. On exam, he is unresponsive to deep painful stimuli and with stertorous respirations. Blood pressure and oxygen saturation are adequate. He does not have a gag reflex. He was intubated to protect his airway. Intubation was done by Dr. Marigene [name], emergency medicine resident.  He was taken to CT scan which showed a large (2.2 cm thick) right-sided subdural hematoma with significant midline shift and subfalcine herniation (1.8 cm).  Past Medical History  Diagnosis Date  . Stroke [facility])     No past surgical history on file.  No family history on file.  Social History:  has no tobacco, alcohol, and drug history on file.  Allergies: No Known Allergies  Medications: I have reviewed the patient's current medications.  Results for orders placed or performed during the hospital encounter of 08/06/2015 (from the past 48 hour(s))  CBC with Differential     Status: Abnormal   Collection Time: 08/16/2015  7:30 PM  Result Value Ref Range   WBC 9.7 4.0 - 10.5 K/uL   RBC 3.73 (L) 4.22 - 5.81 MIL/uL   Hemoglobin 12.8 (L) 13.0 - 17.0 g/dL   HCT 36.7 (L) 39.0 - 52.0 %   MCV 98.4 78.0 - 100.0 fL   MCH 34.3 (H) 26.0 - 34.0 pg   MCHC 34.9 30.0 - 36.0 g/dL   RDW 13.8 11.5 - 15.5 %   Platelets 169 150 - 400 K/uL   Neutrophils Relative % 48 %   Neutro Abs 4.7 1.7 - 7.7 K/uL   Lymphocytes Relative 43 %   Lymphs Abs 4.1 (H) 0.7 - 4.0 K/uL   Monocytes Relative 8 %   Monocytes Absolute 0.7 0.1 - 1.0 K/uL   Eosinophils Relative 1 %   Eosinophils Absolute  0.1 0.0 - 0.7 K/uL   Basophils Relative 0 %   Basophils Absolute 0.0 0.0 - 0.1 K/uL  Comprehensive metabolic panel     Status: Abnormal   Collection Time: 08/02/2015  7:30 PM  Result Value Ref Range   Sodium 143 135 - 145 mmol/L   Potassium 3.2 (L) 3.5 - 5.1 mmol/L   Chloride 107 101 - 111 mmol/L   CO2 29 22 - 32 mmol/L   Glucose, Bld 145 (H) 65 - 99 mg/dL   BUN 10 6 - 20 mg/dL   Creatinine, Ser 1.75 (H) 0.61 - 1.24 mg/dL   Calcium 8.8 (L) 8.9 - 10.3 mg/dL   Total Protein 6.4 (L) 6.5 - 8.1 g/dL   Albumin 3.7 3.5 - 5.0 g/dL   AST 39 15 - 41 U/L   ALT 14 (L) 17 - 63 U/L   Alkaline Phosphatase 56 38 - 126 U/L   Total Bilirubin 0.7 0.3 - 1.2 mg/dL   GFR calc non Af Amer 37 (L) >60 mL/min   GFR calc Af Amer 43 (L) >60 mL/min    Comment: (NOTE) The eGFR has been calculated using the CKD EPI equation. This calculation has not been validated in all clinical situations. eGFR's persistently <  60 mL/min signify possible Chronic Kidney Disease.    Anion gap 7 5 - 15  Protime-INR     Status: None   Collection Time: 08/08/2015  7:30 PM  Result Value Ref Range   Prothrombin Time 12.6 11.6 - 15.2 seconds   INR 0.92 0.00 - 1.49  Ethanol     Status: None   Collection Time: 08/15/2015  7:30 PM  Result Value Ref Range   Alcohol, Ethyl (B) <5 <5 mg/dL    Comment:        LOWEST DETECTABLE LIMIT FOR SERUM ALCOHOL IS 5 mg/dL FOR MEDICAL PURPOSES ONLY   Acetaminophen level     Status: Abnormal   Collection Time:   7:30 PM  Result Value Ref Range   Acetaminophen (Tylenol), Serum <10 (L) 10 - 30 ug/mL    Comment:        THERAPEUTIC CONCENTRATIONS VARY SIGNIFICANTLY. A RANGE OF 10-30 ug/mL MAY BE AN EFFECTIVE CONCENTRATION FOR MANY PATIENTS. HOWEVER, SOME ARE BEST TREATED AT CONCENTRATIONS OUTSIDE THIS RANGE. ACETAMINOPHEN CONCENTRATIONS >150 ug/mL AT 4 HOURS AFTER INGESTION AND >50 ug/mL AT 12 HOURS AFTER INGESTION ARE OFTEN ASSOCIATED WITH TOXIC REACTIONS.   Salicylate level      Status: None   Collection Time: 08/06/2015  7:30 PM  Result Value Ref Range   Salicylate Lvl <4.5 2.8 - 30.0 mg/dL  I-Stat CG4 Lactic Acid, ED     Status: None   Collection Time: 08/12/2015  7:48 PM  Result Value Ref Range   Lactic Acid, Venous 1.88 0.5 - 2.0 mmol/L  I-Stat arterial blood gas, ED     Status: Abnormal   Collection Time: 07/24/2015  8:16 PM  Result Value Ref Range   pH, Arterial 7.549 (H) 7.350 - 7.450   pCO2 arterial 35.2 35.0 - 45.0 mmHg   pO2, Arterial 491.0 (H) 80.0 - 100.0 mmHg   Bicarbonate 30.9 (H) 20.0 - 24.0 mEq/L   TCO2 32 0 - 100 mmol/L   O2 Saturation 100.0 %   Acid-Base Excess 8.0 (H) 0.0 - 2.0 mmol/L   Patient temperature 96.8 F    Collection site RADIAL, ALLEN'S TEST ACCEPTABLE    Drawn by RT    Sample type ARTERIAL     Ct Head Wo Contrast  08/09/2015  CLINICAL DATA:  Status post fall.  On a ventilator. EXAM: CT HEAD WITHOUT CONTRAST TECHNIQUE: Contiguous axial images were obtained from the base of the skull through the vertex without intravenous contrast. COMPARISON:  06/17/2015, 06/03/2015 FINDINGS: Brain: Large hyperdense extra-axial fluid collection along the right cerebral convexity measuring 2.2 cm in greatest thickness consistent with a subdural hematoma. Subdural blood is noted along the tentorium. Subfalcine leftward herniation measuring 1.8 cm. Severe compression of the right lateral ventricle. Severe compression of the right temporal horn. Basal cisterns are effaced consistent with increased intracranial pressure. No evidence of acute infarction. Vascular: Intracranial atherosclerotic disease. Skull: Prior right parietal craniotomy. Sinuses/Orbits: Bilateral mastoid effusions. Small mucous retention cyst in the right maxillary sinus. Other: Endotracheal tube noted. IMPRESSION: 1. Large acute subdural hematoma along the right lateral convexity measuring 2.2 cm in thickness. Subfalcine leftward herniation measuring 1.8 cm. Severe compression of the right  lateral ventricle and right temporal horn. Basal cisterns are effaced. Overall findings consistent with increased intracranial pressure. Critical Value/emergent results were called by telephone at the time of interpretation on 08/14/2015 at 8:23 pm to Dr. Delora Fuel , who verbally acknowledged these results. Electronically Signed   By: Kathreen Devoid  On: 08/03/2015 20:22   Ct Cervical Spine Wo Contrast  08/05/2015  CLINICAL DATA:  Recent craniotomy with fall and neck pain, initial encounter EXAM: CT CERVICAL SPINE WITHOUT CONTRAST TECHNIQUE: Multidetector CT imaging of the cervical spine was performed without intravenous contrast. Multiplanar CT image reconstructions were also generated. COMPARISON:  None. FINDINGS: Skull base again demonstrates findings consistent with the known right-sided subdural hematoma. Seven cervical segments are well visualized. Vertebral body height is well maintained. Osteophytic changes are noted throughout the cervical spine as well as facet hypertrophic changes. No findings to suggest acute fracture or acute facet abnormality are seen. No soft tissue abnormality is noted. An endotracheal tube is noted in satisfactory position. IMPRESSION: Degenerative change without acute abnormality. Electronically Signed   By: Inez Catalina [name]   On: 07/18/2015 20:40   Dg Chest Portable 1 View  08/14/2015  CLINICAL DATA:  Unresponsive, ETT placement EXAM: PORTABLE CHEST 1 VIEW COMPARISON:  None. FINDINGS: Endotracheal tube terminates 3 cm above the carina. Left lung base is obscured. No definite pleural effusion. No focal consolidation. No pneumothorax. Heart is normal in size. Postsurgical changes related to prior CABG. Median sternotomy. IMPRESSION: Endotracheal tube terminates 3 cm above the carina. Electronically Signed   By: Julian Hy [name]   On: 08/02/2015 19:56    Review of Systems - Negative except As above.  Details of history not known.  Reportedly on plavix for cardiac stent.      Blood pressure 116/45, pulse 68, resp. rate 20, height '5\' 7"'$  (1.702 m), weight 85 kg (187 lb 6.3 oz), SpO2 100 %. Physical Exam  Constitutional: He appears well-developed and well-nourished.  HENT:  Healing right craniotomy incision  Neck:  In collar  Cardiovascular: Normal rate and regular rhythm.   Respiratory: Breath sounds normal.  On Ventilator, Intubated  Neurological: He is unresponsive. He displays abnormal reflex. A cranial nerve deficit and sensory deficit is present. GCS eye subscore is 1. GCS verbal subscore is 1. GCS motor subscore is 1.  Patient is unresponsive.  Right pupil 5 mm and unreactive.  Left pupil 3 mm and unreactive.  No eye opening.  No gag, no Doll's eyes.  No movement or response to central or peripheral noxious stimulus    Assessment/Plan:  Patient is unresponsive with GCS 3.  He has been like this for greater than 2.5 hours.  This patient is not salvageable.  I called and spoke with patient's daughter and explained this.  She is planning on coming to hospital.  I have recommended admission to [facility] with Hospice/palliative care.  There is no role for surgery.  I discussed the situation with Dr. Cyndy [name].  Peggyann Shoals, MD , 9:02 PM

## 2015-07-25 NOTE — Progress Notes (Signed)
RT assisted ED physician with Intubation. Bilateral breath sounds present. ETCO2 positive color change. ETCO2 20. ABG and CXR pending.

## 2015-07-25 NOTE — ED Notes (Signed)
Dr. Vertell Limber ( neurosurgeon ) explained tests results , prognosis and plan of care to family ( son-in-law ).

## 2015-07-25 NOTE — ED Notes (Addendum)
Pt here pov with friend stating he's not responsive.  Pt pulled from car by staff.  Pt with snoring rr and unresponsive. Friend states he and pt were arguing and he fell and hit his head.  Pt tx several months ago for bleed, per friend. LSN 0700.

## 2015-07-25 NOTE — Progress Notes (Signed)
RT transported patient from ED to 3M without any complications.  

## 2015-07-25 NOTE — ED Provider Notes (Signed)
CSN: [id number]     Arrival date & time 07/24/2015  1929 History   First MD Initiated Contact with Patient 07/22/2015 1937     Chief Complaint  Patient presents with  . Altered Mental Status    HPI Comments: 75 year old male with recent right sided craniotomy for chronic SDH presents to the ED via private vehicle to triage unresponsive. Friend reports the patient fell backwards during an argument at approximately 7pm.   Patient is a 75 y.o. male presenting with altered mental status. The history is provided by a friend. The history is limited by the condition of the patient.  Altered Mental Status Presenting symptoms: unresponsiveness   Severity:  Severe Most recent episode:  Today Episode history:  Single Duration: Since falling backwards at 7:00pm during an argument. Timing:  Constant Progression:  Unchanged Chronicity:  New Context: head injury   Recent head injury: at 7pm. Associated symptoms: eye deviation (rightward)     Past Medical History  Diagnosis Date  . Stroke [facility])    No past surgical history on file. No family history on file. Social History  Substance Use Topics  . Smoking status: None  . Smokeless tobacco: None  . Alcohol Use: None    Review of Systems  Unable to perform ROS: Patient unresponsive    Allergies  Review of patient's allergies indicates no known allergies.  Home Medications   Prior to Admission medications   Not on File   Ht 5\' 7"  (1.702 m) Physical Exam  Constitutional: He appears well-developed and well-nourished.  HENT:  Head: Normocephalic and atraumatic.  Right Ear: External ear normal.  Left Ear: External ear normal.  Eyes:  Right pupil 26mm round  Neck:  Cervical collar applied during my exam  Cardiovascular: Normal rate and regular rhythm.   Pulmonary/Chest:  Snoring, sonorous respirations  Abdominal: Soft. He exhibits no distension.  Healing scars over chest and abdomen  Neurological: GCS eye subscore is 1. GCS verbal  subscore is 1. GCS motor subscore is 1.  GCS 3, no gag reflex, right pupil 63mm, left pupil pinpoint  Skin: Skin is warm and dry.  Psychiatric:  Unable to assess    ED Course  .Intubation Date/Time: 07/22/2015 8:03 PM Performed by: Marigene Ehlers Ilan Kahrs Authorized by: Roxanne Mins, DAVID Consent: The procedure was performed in an emergent situation. Indications: airway protection Intubation method: video-assisted Patient status: paralyzed (RSI) Preoxygenation: Nasal cannula and BVM. Pretreatment medications: lidocaine Sedatives: etomidate Paralytic: rocuronium Laryngoscope size: Glidescope 4. Tube size: 7.5 mm Tube type: cuffed Number of attempts: 1 Cords visualized: yes Post-procedure assessment: chest rise,  ETCO2 monitor and CO2 detector Breath sounds: reduced on left Cuff inflated: yes ETT to lip: 22 cm Tube secured with: ETT holder Chest x-ray interpreted by me. Chest x-ray findings: endotracheal tube in appropriate position Patient tolerance: Patient tolerated the procedure well with no immediate complications Comments: Head of bed kept elevated due to concern for intracranial bleeding/elevated ICP.     Labs Review Labs Reviewed  CBC WITH DIFFERENTIAL/PLATELET - Abnormal; Notable for the following:    RBC 3.73 (*)    Hemoglobin 12.8 (*)    HCT 36.7 (*)    MCH 34.3 (*)    Lymphs Abs 4.1 (*)    All other components within normal limits  COMPREHENSIVE METABOLIC PANEL - Abnormal; Notable for the following:    Potassium 3.2 (*)    Glucose, Bld 145 (*)    Creatinine, Ser 1.75 (*)    Calcium 8.8 (*)  Total Protein 6.4 (*)    ALT 14 (*)    GFR calc non Af Amer 37 (*)    GFR calc Af Amer 43 (*)    All other components within normal limits  I-STAT ARTERIAL BLOOD GAS, ED - Abnormal; Notable for the following:    pH, Arterial 7.549 (*)    pO2, Arterial 491.0 (*)    Bicarbonate 30.9 (*)    Acid-Base Excess 8.0 (*)    All other components within normal limits  PROTIME-INR   URINALYSIS, ROUTINE W REFLEX MICROSCOPIC (NOT AT [facility])  ETHANOL  ACETAMINOPHEN LEVEL  SALICYLATE LEVEL  BLOOD GAS, ARTERIAL  URINE RAPID DRUG SCREEN, HOSP PERFORMED  I-STAT CG4 LACTIC ACID, ED    Imaging Review Ct Head Wo Contrast  07/24/2015  CLINICAL DATA:  Status post fall.  On a ventilator. EXAM: CT HEAD WITHOUT CONTRAST TECHNIQUE: Contiguous axial images were obtained from the base of the skull through the vertex without intravenous contrast. COMPARISON:  06/17/2015, 06/03/2015 FINDINGS: Brain: Large hyperdense extra-axial fluid collection along the right cerebral convexity measuring 2.2 cm in greatest thickness consistent with a subdural hematoma. Subdural blood is noted along the tentorium. Subfalcine leftward herniation measuring 1.8 cm. Severe compression of the right lateral ventricle. Severe compression of the right temporal horn. Basal cisterns are effaced consistent with increased intracranial pressure. No evidence of acute infarction. Vascular: Intracranial atherosclerotic disease. Skull: Prior right parietal craniotomy. Sinuses/Orbits: Bilateral mastoid effusions. Small mucous retention cyst in the right maxillary sinus. Other: Endotracheal tube noted. IMPRESSION: 1. Large acute subdural hematoma along the right lateral convexity measuring 2.2 cm in thickness. Subfalcine leftward herniation measuring 1.8 cm. Severe compression of the right lateral ventricle and right temporal horn. Basal cisterns are effaced. Overall findings consistent with increased intracranial pressure. Critical Value/emergent results were called by telephone at the time of interpretation on 08/03/2015 at 8:23 pm to Dr. Delora Fuel , who verbally acknowledged these results. Electronically Signed   By: Kathreen Devoid   On: 07/31/2015 20:22   Ct Cervical Spine Wo Contrast  07/18/2015  CLINICAL DATA:  Recent craniotomy with fall and neck pain, initial encounter EXAM: CT CERVICAL SPINE WITHOUT CONTRAST TECHNIQUE:  Multidetector CT imaging of the cervical spine was performed without intravenous contrast. Multiplanar CT image reconstructions were also generated. COMPARISON:  None. FINDINGS: Skull base again demonstrates findings consistent with the known right-sided subdural hematoma. Seven cervical segments are well visualized. Vertebral body height is well maintained. Osteophytic changes are noted throughout the cervical spine as well as facet hypertrophic changes. No findings to suggest acute fracture or acute facet abnormality are seen. No soft tissue abnormality is noted. An endotracheal tube is noted in satisfactory position. IMPRESSION: Degenerative change without acute abnormality. Electronically Signed   By: Inez Catalina [name]   On: 07/30/2015 20:40   Dg Chest Portable 1 View  07/18/2015  CLINICAL DATA:  Unresponsive, ETT placement EXAM: PORTABLE CHEST 1 VIEW COMPARISON:  None. FINDINGS: Endotracheal tube terminates 3 cm above the carina. Left lung base is obscured. No definite pleural effusion. No focal consolidation. No pneumothorax. Heart is normal in size. Postsurgical changes related to prior CABG. Median sternotomy. IMPRESSION: Endotracheal tube terminates 3 cm above the carina. Electronically Signed   By: Julian Hy [name]   On: 07/30/2015 19:56   I have personally reviewed and evaluated these images and lab results as part of my medical decision-making.   EKG Interpretation None      MDM   Final diagnoses:  Subdural Hematoma with Midline Shift GCS 3 Endotracheally intubated   75 y.o. male presents to the ED from triage due to unresponsiveness. No response to Narcan. Glucose not low. No response to painful stimuli, no gag reflex. Intubated for airway protection. Concern for intracranial hemorrhage. CT head showed large right sided SDH with midline shift.   Neurosurgery consulted while patient was in the CT scanner after I reviewed the images showing a large right SDH with midline shift.  Mannitol ordered. INR normal. Head of bed elevated both before and after CT. Hyperventilating patient with a rate of 20. Will check blood gases and monitor closely.   Dr. Vertell Limber, neurosurgeon, evaluated the patient in the ED.   8:55 PM  Spoke with Daughter, Jaishon Vanderzanden, via telephone. Her information was found listed as his  Informed her that her father is here, and that she needs to come to the ED. She spoke to the Neurosurgeon, Dr. Vertell Limber, via telephone at this time, as well. He informed her of his poor prognosis.  Per neurosurgery, this is a non-survivable bleed. He never had a GCS over 3, was always completely unresponsive. Critical care consulted. They evaluated the patient in the ED and admitted him to their service.   Case managed in conjunction with my attending, Dr. Roxanne Mins.   Berenice Primas, MD [id number]  Delora Fuel, MD [id number]

## 2015-07-25 NOTE — H&P (Signed)
PULMONARY / CRITICAL CARE MEDICINE   Name: [name] MRN: [id number] DOB: 1940-06-06    ADMISSION DATE:  08/10/2015 CONSULTATION DATE:  08/14/2015  REFERRING MD:  Dr. Roxanne Mins  CHIEF COMPLAINT:  Subdural hematoma  HISTORY OF PRESENT ILLNESS:   This is a 75 year old male with a past medical history significant for chronic subdural hematoma who underwent an evacuation of the same a few weeks ago who presents to our facility after collapsing at home. Apparently he was in his usual state of health until he suddenly fell backwards while talking to a friend. He was brought to the emergency department within 30 minutes. Here in the ER he required intubation for airway protection. CT scan confirmed a large right-sided subdural hematoma causing brain herniation. Neurosurgery was consulted and stated that based on the CT scan findings, the severity of his neurologic injury based on exam, and the timing of the event that there is no intervention that can be made that will change his outcome. His overall prognosis is grim.  History was obtained entirely from talking to family members and chart review, the patient was obtunded on my exam.  PAST MEDICAL HISTORY :  He  has a past medical history of Stroke [facility]).  PAST SURGICAL HISTORY: He  has no past surgical history on file.  No Known Allergies  No current facility-administered medications on file prior to encounter.   No current outpatient prescriptions on file prior to encounter.    FAMILY HISTORY:  His has no family status information on file.   SOCIAL HISTORY: He    REVIEW OF SYSTEMS:   Cannot obtain due to altered mental status  SUBJECTIVE:  As above  VITAL SIGNS: BP 120/41 mmHg  Pulse 67  Resp 20  Ht 5\' 7"  (1.702 m)  Wt 85 kg (187 lb 6.3 oz)  BMI 29.34 kg/m2  SpO2 100%  HEMODYNAMICS:    VENTILATOR SETTINGS: Vent Mode:  [-] PRVC FiO2 (%):  [50 %-100 %] 50 % Set Rate:  [16 bmp-20 bmp] 20 bmp Vt Set:  [530 mL] 530  mL PEEP:  [5 cmH20] 5 cmH20 Plateau Pressure:  [21 cmH20] 21 cmH20  INTAKE / OUTPUT:    PHYSICAL EXAMINATION: General:  On ventilator Neuro:  GCS 3, weak cough to suction, spontaneous breathing on pressure support HEENT:  oropharynx with dry mucous membranes, endotracheal tube in place Cardiovascular:  Regular rate and rhythm no murmurs, S4 gallop noted Lungs:  Clear to auscultation bilaterally, ventilator supported breaths Abdomen:  Belly soft, nontender bowel sounds positive Musculoskeletal:  Diminished tone Skin:  No bruising, no rash  LABS:  BMET  Recent Labs Lab 08/13/2015 1930  NA 143  K 3.2*  CL 107  CO2 29  BUN 10  CREATININE 1.75*  GLUCOSE 145*    Electrolytes  Recent Labs Lab 07/24/2015 1930  CALCIUM 8.8*    CBC  Recent Labs Lab 07/24/2015 1930  WBC 9.7  HGB 12.8*  HCT 36.7*  PLT 169    Coag's  Recent Labs Lab 08/03/2015 1930  INR 0.92    Sepsis Markers  Recent Labs Lab 07/24/2015 1948  LATICACIDVEN 1.88    ABG  Recent Labs Lab 07/28/2015 2016  PHART 7.549*  PCO2ART 35.2  PO2ART 491.0*    Liver Enzymes  Recent Labs Lab 07/19/2015 1930  AST 39  ALT 14*  ALKPHOS 56  BILITOT 0.7  ALBUMIN 3.7    Cardiac Enzymes No results for input(s): TROPONINI, PROBNP in the last 168 hours.  Glucose No results for input(s): GLUCAP in the last 168 hours.  Imaging Ct Head Wo Contrast  07/26/2015  CLINICAL DATA:  Status post fall.  On a ventilator. EXAM: CT HEAD WITHOUT CONTRAST TECHNIQUE: Contiguous axial images were obtained from the base of the skull through the vertex without intravenous contrast. COMPARISON:  06/17/2015, 06/03/2015 FINDINGS: Brain: Large hyperdense extra-axial fluid collection along the right cerebral convexity measuring 2.2 cm in greatest thickness consistent with a subdural hematoma. Subdural blood is noted along the tentorium. Subfalcine leftward herniation measuring 1.8 cm. Severe compression of the right lateral  ventricle. Severe compression of the right temporal horn. Basal cisterns are effaced consistent with increased intracranial pressure. No evidence of acute infarction. Vascular: Intracranial atherosclerotic disease. Skull: Prior right parietal craniotomy. Sinuses/Orbits: Bilateral mastoid effusions. Small mucous retention cyst in the right maxillary sinus. Other: Endotracheal tube noted. IMPRESSION: 1. Large acute subdural hematoma along the right lateral convexity measuring 2.2 cm in thickness. Subfalcine leftward herniation measuring 1.8 cm. Severe compression of the right lateral ventricle and right temporal horn. Basal cisterns are effaced. Overall findings consistent with increased intracranial pressure. Critical Value/emergent results were called by telephone at the time of interpretation on 07/19/2015 at 8:23 pm to Dr. Delora Fuel , who verbally acknowledged these results. Electronically Signed   By: Kathreen Devoid   On: 07/29/2015 20:22   Ct Cervical Spine Wo Contrast  07/26/2015  CLINICAL DATA:  Recent craniotomy with fall and neck pain, initial encounter EXAM: CT CERVICAL SPINE WITHOUT CONTRAST TECHNIQUE: Multidetector CT imaging of the cervical spine was performed without intravenous contrast. Multiplanar CT image reconstructions were also generated. COMPARISON:  None. FINDINGS: Skull base again demonstrates findings consistent with the known right-sided subdural hematoma. Seven cervical segments are well visualized. Vertebral body height is well maintained. Osteophytic changes are noted throughout the cervical spine as well as facet hypertrophic changes. No findings to suggest acute fracture or acute facet abnormality are seen. No soft tissue abnormality is noted. An endotracheal tube is noted in satisfactory position. IMPRESSION: Degenerative change without acute abnormality. Electronically Signed   By: Inez Catalina [name]   On: 07/21/2015 20:40   Dg Chest Portable 1 View  08/16/2015  CLINICAL DATA:   Unresponsive, ETT placement EXAM: PORTABLE CHEST 1 VIEW COMPARISON:  None. FINDINGS: Endotracheal tube terminates 3 cm above the carina. Left lung base is obscured. No definite pleural effusion. No focal consolidation. No pneumothorax. Heart is normal in size. Postsurgical changes related to prior CABG. Median sternotomy. IMPRESSION: Endotracheal tube terminates 3 cm above the carina. Electronically Signed   By: Julian Hy [name]   On: 07/19/2015 19:56     STUDIES:  CT head images personally reviewed showing a large subdural hematoma as detailed above  CULTURES: None  ANTIBIOTICS: None  SIGNIFICANT EVENTS: June 8 admission  LINES/TUBES: June 8 endotracheal tube  DISCUSSION: 75 year old male with a past medical history significant for recent subdural hematoma evacuation is being admitted on 08/03/2015 with recurrent subdural bleed causing brain herniation. His prognosis is dismal. Currently his neurologic exam shows severe neurologic injury and this will very likely progress to brain death over the course of the evening. There is no intervention we can make which will be medically beneficial or change his outcome.  ASSESSMENT / PLAN:  PULMONARY A: Inability to protect airway, on mechanical ventilation P:   Full ventilatory support Ventilator associated pneumonia prevention protocol Extubate when family ready to withdraw care  NEUROLOGIC A:   Large subdural hematoma comatose  Brain herniation P:   RASS goal: 0 No sedation   FAMILY  - Updates: I updated his daughter, son-in-law, and 2 granddaughters at length in the emergency room this evening. I advised that his brain injury is severe and that he will not survive this illness and there is no intervention that we can make which will change that outcome. I explained to them that he will likely progress to brain death over night. I also explained that intervention such as CPR and electric shocks for cardioversion will cause  unnecessary pain and suffering and will not change his outcome. His granddaughters do not want him to undergo this and they state that they know that the patient would want to be extubated now. However, his daughter feels that she needs to "give him every possible chance" to survive. He remains full code for now. The family asked that we not perform exhaustive CPR if he does have a cardiac arrest. I think we will need palliative care involvement in the morning.   In the meantime, I will provide no other support other than mechanical ventilation. No further imaging or lab work necessary at this time.  - Inter-disciplinary family meet or Palliative Care meeting due by:  day 7  My critical care time 60 minutes  Roselie Awkward, MD [place] [facility] Pager: [phone] Cell: [phone] After 3pm or if no response, call [phone]   07/30/2015, 9:46 PM

## 2015-07-26 DIAGNOSIS — R402433 Glasgow coma scale score 3-8, at hospital admission: Secondary | ICD-10-CM

## 2015-07-26 DIAGNOSIS — J9600 Acute respiratory failure, unspecified whether with hypoxia or hypercapnia: Secondary | ICD-10-CM

## 2015-07-26 LAB — MRSA PCR SCREENING: MRSA by PCR: NEGATIVE

## 2015-07-26 MED ORDER — MORPHINE SULFATE (PF) 2 MG/ML IV SOLN: 2.0000 mg | INTRAVENOUS | Status: DC | PRN

## 2015-07-26 MED ORDER — METOPROLOL TARTRATE 5 MG/5ML IV SOLN
2.5000 mg | INTRAVENOUS | Status: DC | PRN
Start: 2015-07-26 — End: 2015-07-28

## 2015-07-26 MED ORDER — ANTISEPTIC ORAL RINSE SOLUTION (CORINZ)
7.0000 mL | Freq: Four times a day (QID) | OROMUCOSAL | Status: DC
  Administered 2015-07-26 – 2015-07-28 (×6): 7 mL via OROMUCOSAL

## 2015-07-26 MED ORDER — METOPROLOL TARTRATE 5 MG/5ML IV SOLN
INTRAVENOUS | Status: AC
  Administered 2015-07-26: 5 mg
  Filled 2015-07-26: qty 5

## 2015-07-26 NOTE — Progress Notes (Signed)
Pupils midsized and fixed No motor response to painful stimulus CT shows large subdural hematoma with brainstem compression There is no hope for survival Will discuss with family

## 2015-07-26 NOTE — Progress Notes (Signed)
Subjective: Patient reports (unresponsive)  Objective: Vital signs in last 24 hours: Temp:  [94.9 F (34.9 C)-98.9 F (37.2 C)] 98.9 F (37.2 C) (06/09 0430) Pulse Rate:  [28-90] 28 (06/09 0700) Resp:  [16-27] 20 (06/09 0730) BP: (79-145)/(38-91) 141/43 mmHg (06/09 0730) SpO2:  [90 %-100 %] 100 % (06/09 0700) FiO2 (%):  [40 %-100 %] 40 % (06/09 0600) Weight:  [83.1 kg (183 lb 3.2 oz)-85 kg (187 lb 6.3 oz)] 83.1 kg (183 lb 3.2 oz) (06/09 0300)  Intake/Output from previous day: 06/08 0701 - 06/09 0700 In: 80 [I.V.:80] Out: 450 [Urine:450] Intake/Output this shift:    Vent support. Pupils unequal, unreactive. Family has not arrived this mornig, but nursing reports interaction yesterday revealed realistic view of pts poor prognosis.    Lab Results:  Recent Labs  07/24/2015 1930  WBC 9.7  HGB 12.8*  HCT 36.7*  PLT 169   BMET  Recent Labs  08/05/2015 1930  NA 143  K 3.2*  CL 107  CO2 29  GLUCOSE 145*  BUN 10  CREATININE 1.75*  CALCIUM 8.8*    Studies/Results: Ct Head Wo Contrast  08/01/2015  CLINICAL DATA:  Status post fall.  On a ventilator. EXAM: CT HEAD WITHOUT CONTRAST TECHNIQUE: Contiguous axial images were obtained from the base of the skull through the vertex without intravenous contrast. COMPARISON:  06/17/2015, 06/03/2015 FINDINGS: Brain: Large hyperdense extra-axial fluid collection along the right cerebral convexity measuring 2.2 cm in greatest thickness consistent with a subdural hematoma. Subdural blood is noted along the tentorium. Subfalcine leftward herniation measuring 1.8 cm. Severe compression of the right lateral ventricle. Severe compression of the right temporal horn. Basal cisterns are effaced consistent with increased intracranial pressure. No evidence of acute infarction. Vascular: Intracranial atherosclerotic disease. Skull: Prior right parietal craniotomy. Sinuses/Orbits: Bilateral mastoid effusions. Small mucous retention cyst in the right  maxillary sinus. Other: Endotracheal tube noted. IMPRESSION: 1. Large acute subdural hematoma along the right lateral convexity measuring 2.2 cm in thickness. Subfalcine leftward herniation measuring 1.8 cm. Severe compression of the right lateral ventricle and right temporal horn. Basal cisterns are effaced. Overall findings consistent with increased intracranial pressure. Critical Value/emergent results were called by telephone at the time of interpretation on 08/05/2015 at 8:23 pm to Dr. Delora Fuel , who verbally acknowledged these results. Electronically Signed   By: Kathreen Devoid   On: 07/31/2015 20:22   Ct Cervical Spine Wo Contrast    CLINICAL DATA:  Recent craniotomy with fall and neck pain, initial encounter EXAM: CT CERVICAL SPINE WITHOUT CONTRAST TECHNIQUE: Multidetector CT imaging of the cervical spine was performed without intravenous contrast. Multiplanar CT image reconstructions were also generated. COMPARISON:  None. FINDINGS: Skull base again demonstrates findings consistent with the known right-sided subdural hematoma. Seven cervical segments are well visualized. Vertebral body height is well maintained. Osteophytic changes are noted throughout the cervical spine as well as facet hypertrophic changes. No findings to suggest acute fracture or acute facet abnormality are seen. No soft tissue abnormality is noted. An endotracheal tube is noted in satisfactory position. IMPRESSION: Degenerative change without acute abnormality. Electronically Signed   By: Inez Catalina [name]   On: 07/23/2015 20:40   Dg Chest Portable 1 View  08/12/2015  CLINICAL DATA:  Unresponsive, ETT placement EXAM: PORTABLE CHEST 1 VIEW COMPARISON:  None. FINDINGS: Endotracheal tube terminates 3 cm above the carina. Left lung base is obscured. No definite pleural effusion. No focal consolidation. No pneumothorax. Heart is normal in size. Postsurgical changes  related to prior CABG. Median sternotomy. IMPRESSION:  Endotracheal tube terminates 3 cm above the carina. Electronically Signed   By: Julian Hy [name]   On: 08/16/2015 19:56    Assessment/Plan:   LOS: 1 day  Palliative care planned.    Verdis Prime 07/26/2015, 7:43 AM

## 2015-07-26 NOTE — Progress Notes (Signed)
Pt GCS 4. CDS notified at 2220. Referral Number [id number].   Will continue to update as needed.

## 2015-07-26 NOTE — Progress Notes (Signed)
[place] Progress Note Patient Name: [name] DOB: 1940-07-21 MRN: [id number]   Date of Service  07/26/2015  HPI/Events of Note  Tachy to the 160s with irregular HR on monitor.  Also hypertensive with SBP in the 160s to 170s.    eICU Interventions  Plan: PRN lopressor for HR control     Intervention Category Intermediate Interventions: Arrhythmia - evaluation and management;Hypertension - evaluation and management  Ivey Cina 07/26/2015, 11:21 PM

## 2015-07-26 NOTE — Progress Notes (Signed)
PULMONARY / CRITICAL CARE MEDICINE   Name: [name] MRN: [id number] DOB: May 24, 1940    ADMISSION DATE:  07/30/2015 CONSULTATION DATE:  08/07/2015  REFERRING MD:  Dr. Roxanne Mins  CHIEF COMPLAINT:  Subdural hematoma  HISTORY OF PRESENT ILLNESS:   This is a 75 year old male with a past medical history significant for chronic subdural hematoma who underwent an evacuation of the same a few weeks ago who presents to our facility after collapsing at home. Apparently he was in his usual state of health until he suddenly fell backwards while talking to a friend. He was brought to the emergency department within 30 minutes. Here in the ER he required intubation for airway protection. CT scan confirmed a large right-sided subdural hematoma causing brain herniation. Neurosurgery was consulted and stated that based on the CT scan findings, the severity of his neurologic injury based on exam, and the timing of the event that there is no intervention that can be made that will change his outcome. His overall prognosis is grim.  History was obtained entirely from talking to family members and chart review, the patient was obtunded on my exam.   SUBJECTIVE:  As above  VITAL SIGNS: BP 129/51 mmHg  Pulse 83  Temp(Src) 99 F (37.2 C) (Oral)  Resp 20  Ht 5\' 7"  (1.702 m)  Wt 183 lb 3.2 oz (83.1 kg)  BMI 28.69 kg/m2  SpO2 100%  HEMODYNAMICS:    VENTILATOR SETTINGS: Vent Mode:  [-] PRVC FiO2 (%):  [40 %-100 %] 40 % Set Rate:  [16 bmp-20 bmp] 20 bmp Vt Set:  [530 mL] 530 mL PEEP:  [5 cmH20] 5 cmH20 Plateau Pressure:  [19 cmH20-21 cmH20] 19 cmH20  INTAKE / OUTPUT: I/O last 3 completed shifts: In: 28 [I.V.:80] Out: 450 [Urine:450]  PHYSICAL EXAMINATION: General:  On ventilator Neuro:  GCS 3, no cough to suction, spontaneous breathing on pressure support HEENT:  oropharynx with dry mucous membranes, endotracheal tube in place Cardiovascular:  Regular rate and rhythm no murmurs, S4 gallop  noted Lungs:  Clear to auscultation bilaterally, ventilator supported breaths Abdomen:  Belly soft, nontender bowel sounds positive Musculoskeletal:  Diminished tone Skin:  No bruising, no rash  LABS:  BMET  Recent Labs Lab 08/02/2015 1930  NA 143  K 3.2*  CL 107  CO2 29  BUN 10  CREATININE 1.75*  GLUCOSE 145*    Electrolytes  Recent Labs Lab 07/28/2015 1930  CALCIUM 8.8*    CBC  Recent Labs Lab 07/22/2015 1930  WBC 9.7  HGB 12.8*  HCT 36.7*  PLT 169    Coag's  Recent Labs Lab 07/28/2015 1930  INR 0.92    Sepsis Markers  Recent Labs Lab 08/06/2015 1948  LATICACIDVEN 1.88    ABG  Recent Labs Lab 08/11/2015 2016  PHART 7.549*  PCO2ART 35.2  PO2ART 491.0*    Liver Enzymes  Recent Labs Lab 07/22/2015 1930  AST 39  ALT 14*  ALKPHOS 56  BILITOT 0.7  ALBUMIN 3.7    Cardiac Enzymes No results for input(s): TROPONINI, PROBNP in the last 168 hours.  Glucose No results for input(s): GLUCAP in the last 168 hours.  Imaging Ct Head Wo Contrast  07/24/2015  CLINICAL DATA:  Status post fall.  On a ventilator. EXAM: CT HEAD WITHOUT CONTRAST TECHNIQUE: Contiguous axial images were obtained from the base of the skull through the vertex without intravenous contrast. COMPARISON:  06/17/2015, 06/03/2015 FINDINGS: Brain: Large hyperdense extra-axial fluid collection along the right cerebral convexity  measuring 2.2 cm in greatest thickness consistent with a subdural hematoma. Subdural blood is noted along the tentorium. Subfalcine leftward herniation measuring 1.8 cm. Severe compression of the right lateral ventricle. Severe compression of the right temporal horn. Basal cisterns are effaced consistent with increased intracranial pressure. No evidence of acute infarction. Vascular: Intracranial atherosclerotic disease. Skull: Prior right parietal craniotomy. Sinuses/Orbits: Bilateral mastoid effusions. Small mucous retention cyst in the right maxillary sinus. Other:  Endotracheal tube noted. IMPRESSION: 1. Large acute subdural hematoma along the right lateral convexity measuring 2.2 cm in thickness. Subfalcine leftward herniation measuring 1.8 cm. Severe compression of the right lateral ventricle and right temporal horn. Basal cisterns are effaced. Overall findings consistent with increased intracranial pressure. Critical Value/emergent results were called by telephone at the time of interpretation on 07/18/2015 at 8:23 pm to Dr. Delora Fuel , who verbally acknowledged these results. Electronically Signed   By: Kathreen Devoid   On: 08/14/2015 20:22   Ct Cervical Spine Wo Contrast  08/07/2015  CLINICAL DATA:  Recent craniotomy with fall and neck pain, initial encounter EXAM: CT CERVICAL SPINE WITHOUT CONTRAST TECHNIQUE: Multidetector CT imaging of the cervical spine was performed without intravenous contrast. Multiplanar CT image reconstructions were also generated. COMPARISON:  None. FINDINGS: Skull base again demonstrates findings consistent with the known right-sided subdural hematoma. Seven cervical segments are well visualized. Vertebral body height is well maintained. Osteophytic changes are noted throughout the cervical spine as well as facet hypertrophic changes. No findings to suggest acute fracture or acute facet abnormality are seen. No soft tissue abnormality is noted. An endotracheal tube is noted in satisfactory position. IMPRESSION: Degenerative change without acute abnormality. Electronically Signed   By: Inez Catalina [name]   On: 08/06/2015 20:40   Dg Chest Portable 1 View  08/04/2015  CLINICAL DATA:  Unresponsive, ETT placement EXAM: PORTABLE CHEST 1 VIEW COMPARISON:  None. FINDINGS: Endotracheal tube terminates 3 cm above the carina. Left lung base is obscured. No definite pleural effusion. No focal consolidation. No pneumothorax. Heart is normal in size. Postsurgical changes related to prior CABG. Median sternotomy. IMPRESSION: Endotracheal tube terminates 3 cm  above the carina. Electronically Signed   By: Julian Hy [name]   On: 08/13/2015 19:56     STUDIES:  CT head images personally reviewed showing a large subdural hematoma as detailed above  CULTURES: None  ANTIBIOTICS: None  SIGNIFICANT EVENTS: June 8 admission  LINES/TUBES: June 8 endotracheal tube  DISCUSSION: 75 year old male with a past medical history significant for recent subdural hematoma evacuation is being admitted on 08/14/2015 with recurrent subdural bleed causing brain herniation. His prognosis is dismal. Currently his neurologic exam shows severe neurologic injury and this will very likely progress to brain death over the course of the evening. There is no intervention we can make which will be medically beneficial or change his outcome.  ASSESSMENT / PLAN:  PULMONARY A: Inability to protect airway, on mechanical ventilation P:   Full ventilatory support Ventilator associated pneumonia prevention protocol Extubate when family ready to withdraw care  NEUROLOGIC A:   Large subdural hematoma comatose Brain herniation P:   RASS goal: 0 No sedation   FAMILY  - Updates: Dr. Lake Bells  updated his daughter, son-in-law, and 2 granddaughters at length in the emergency room this 6/8. He advised that his brain injury is severe and that he will not survive this illness and there is no intervention that we can make which will change that outcome. I explained to them that  he will likely progress to brain death over night. I also explained that intervention such as CPR and electric shocks for cardioversion will cause unnecessary pain and suffering and will not change his outcome. His granddaughters do not want him to undergo this and they state that they know that the patient would want to be extubated now. However, his daughter feels that she needs to "give him every possible chance" to survive. He remains full code for now. The family asked that we not perform exhaustive  CPR if he does have a cardiac arrest. I think we will need palliative care involvement in the morning.   In the meantime, We will provide no other support other than mechanical ventilation. No further imaging or lab work necessary at this time.  - Inter-disciplinary family meet or Palliative Care meeting due by:  day 7  [facility] Minor ACNP [facility] Shape PCCM Pager [phone] till 3 pm If no answer page [phone] 07/26/2015, 9:07 AM   Attending Note:  I have examined patient, reviewed labs, studies and notes. I have discussed the case with S Minor and Dr Lake Bells, and I agree with the data and plans as amended above. Pt has GCS 3 after acute on chronic SDH. There is no chance for survival or neuro improvement, no intervention to be done. He does have some basic reflexes including ability to initiate breaths. Will discuss status and prognosis, need to withdraw care with his daughter when she is available.  No Independent critical care time is 31 minutes.   Baltazar Apo, MD, PhD 07/26/2015, 12:54 PM [facility] [phone] or if no answer [phone]

## 2015-07-26 NOTE — Progress Notes (Signed)
PCCM Interval Note  Discussed prognosis and goals w daughter and granddaughter 6/9 at bedside. They understand that he cannot survive, but are processing, still have some denial. Not ready to withdraw care at this time. I told them that they need to speak w family, prepare to withdraw in next few days. Will need to continue to discuss with them.    Baltazar Apo, MD, PhD 07/26/2015, 3:28 PM [place] [phone] or if no answer [phone]

## 2015-07-26 NOTE — Care Management Note (Signed)
Case Management Note  Patient Details  Name: AWAD HOGLUND MRN: [id number] Date of Birth: 12-02-1940  Subjective/Objective:  Pt admitted 07/28/2015 s/p large SDH with brain herniation.  PTA, pt independent of ADLS.  He has supportive daughter and granddaughterat bedside.                   Action/Plan: Pt's prognosis is very poor.  Plan withdrawal of care within next couple of days, as family processes prognosis.     Expected Discharge Date:                  Expected Discharge Plan:     In-House Referral:     Discharge planning Services  CM Consult  Post Acute Care Choice:    Choice offered to:     DME Arranged:    DME Agency:     HH Arranged:    HH Agency:     Status of Service:  In process, will continue to follow  Medicare Important Message Given:    Date Medicare IM Given:    Medicare IM give by:    Date Additional Medicare IM Given:    Additional Medicare Important Message give by:     If discussed at [place] of Stay Meetings, dates discussed:    Additional Comments:  Reinaldo Raddle, RN, BSN  Trauma/Neuro ICU Case Manager [phone]

## 2015-07-26 NOTE — Progress Notes (Signed)
Nutrition Brief Note  Chart reviewed. No nutrition interventions planned at this time.  Please consult as needed.   [place], [place], [place] Pager [phone] After Hours Pager

## 2015-07-27 DIAGNOSIS — G9382 Brain death: Secondary | ICD-10-CM

## 2015-07-27 DIAGNOSIS — F430 Acute stress reaction: Secondary | ICD-10-CM

## 2015-07-27 DIAGNOSIS — J9601 Acute respiratory failure with hypoxia: Secondary | ICD-10-CM

## 2015-07-27 LAB — BLOOD GAS, ARTERIAL
ACID-BASE EXCESS: 2.2 mmol/L — AB (ref 0.0–2.0)
ACID-BASE EXCESS: 2.2 mmol/L — AB (ref 0.0–2.0)
BICARBONATE: 28 meq/L — AB (ref 20.0–24.0)
Bicarbonate: 26.1 mEq/L — ABNORMAL HIGH (ref 20.0–24.0)
DRAWN BY: 331761
Drawn by: 213381
FIO2: 0.4
LHR: 6 {breaths}/min
O2 Content: 8 L/min
O2 SAT: 99 %
O2 Saturation: 99.2 %
PCO2 ART: 39.4 mmHg (ref 35.0–45.0)
PCO2 ART: 59.5 mmHg — AB (ref 35.0–45.0)
PEEP/CPAP: 5 cmH2O
PH ART: 7.437 (ref 7.350–7.450)
PH ART: 7.295 — AB (ref 7.350–7.450)
PO2 ART: 302 mmHg — AB (ref 80.0–100.0)
Patient temperature: 98.6
Patient temperature: 98.7
TCO2: 27.3 mmol/L (ref 0–100)
TCO2: 29.8 mmol/L (ref 0–100)
VT: 530 mL
pO2, Arterial: 170 mmHg — ABNORMAL HIGH (ref 80.0–100.0)

## 2015-07-27 LAB — POCT I-STAT 3, ART BLOOD GAS (G3+)
Acid-Base Excess: 2 mmol/L (ref 0.0–2.0)
Acid-Base Excess: 3 mmol/L — ABNORMAL HIGH (ref 0.0–2.0)
Bicarbonate: 21.5 mEq/L (ref 20.0–24.0)
Bicarbonate: 25 mEq/L — ABNORMAL HIGH (ref 20.0–24.0)
O2 SAT: 99 %
O2 SAT: 100 %
PCO2 ART: 19.4 mmHg — AB (ref 35.0–45.0)
PCO2 ART: 29.7 mmHg — AB (ref 35.0–45.0)
PH ART: 7.532 — AB (ref 7.350–7.450)
PO2 ART: 121 mmHg — AB (ref 80.0–100.0)
Patient temperature: 98.4
Patient temperature: 98.3
TCO2: 22 mmol/L (ref 0–100)
TCO2: 26 mmol/L (ref 0–100)
pH, Arterial: 7.653 (ref 7.350–7.450)
pO2, Arterial: 163 mmHg — ABNORMAL HIGH (ref 80.0–100.0)

## 2015-07-27 NOTE — Progress Notes (Signed)
Patient ID: [name], male   DOB: September 22, 1940, 75 y.o.   MRN: [id number] BP 95/50 mmHg  Pulse 89  Temp(Src) 98.1 F (36.7 C) (Axillary)  Resp 15  Ht 5\' 7"  (1.702 m)  Wt 83.1 kg (183 lb 3.2 oz)  BMI 28.69 kg/m2  SpO2 100% Comfort care No neurologic changes, non responsive

## 2015-07-27 NOTE — Progress Notes (Signed)
Apnea test performed per MD order.  Pt given 8L of oxygen via O2 tubing for 8 min. Per protocol.  RN and RT at bedside.  No pt effort noted.  ABG drawn after 8 min and given to doctor.  Pt placed back on previous vent settings.  RT will continue to monitor.

## 2015-07-27 NOTE — Progress Notes (Signed)
Dr. Nelda Marseille spoke with family (daugter and granddaughters) via speaker phone. He explained the results of the apnea test showed no brain function and the patient is brain dead. Family to call in other relatives to say their goodbyes. Chaplain at bedside. Will cont to provide care for both pt and family as needed.

## 2015-07-27 NOTE — Progress Notes (Signed)
Pt blood pressures dropping. Last pressure 94/59 (70). Notified E-link. No new orders given at this time.

## 2015-07-27 NOTE — Progress Notes (Signed)
   07/29/2015 1500  Clinical Encounter Type  Visited With Patient and family together  Visit Type Spiritual support  Referral From Nurse  Spiritual Encounters  Spiritual Needs Emotional;Grief support  Stress Factors  Patient Stress Factors Health changes  Family Stress Factors Health changes  Chaplain responded to call, family advised of decline in patient condition by MD, provided ministry of presence, emotional and spiritual support, prayer given. Advised that further support will be provided upon request.

## 2015-07-27 NOTE — Progress Notes (Signed)
PULMONARY / CRITICAL CARE MEDICINE   Name: AVANISH WIEMKEN MRN: [id number] DOB: August 19, 1940    ADMISSION DATE:  07/20/2015 CONSULTATION DATE:  07/22/2015  REFERRING MD:  Dr. Roxanne Mins  CHIEF COMPLAINT:  Subdural hematoma  HISTORY OF PRESENT ILLNESS:   This is a 75 year old male with a past medical history significant for chronic subdural hematoma who underwent an evacuation of the same a few weeks ago who presents to our facility after collapsing at home. Apparently he was in his usual state of health until he suddenly fell backwards while talking to a friend. He was brought to the emergency department within 30 minutes. Here in the ER he required intubation for airway protection. CT scan confirmed a large right-sided subdural hematoma causing brain herniation. Neurosurgery was consulted and stated that based on the CT scan findings, the severity of his neurologic injury based on exam, and the timing of the event that there is no intervention that can be made that will change his outcome. His overall prognosis is grim.  History was obtained entirely from talking to family members and chart review, the patient was obtunded on my exam.   SUBJECTIVE:  Borderline hypotension overnight.  VITAL SIGNS: BP 87/52 mmHg  Pulse 75  Temp(Src) 98.4 F (36.9 C) (Axillary)  Resp 20  Ht 5\' 7"  (1.702 m)  Wt 83.1 kg (183 lb 3.2 oz)  BMI 28.69 kg/m2  SpO2 99%  HEMODYNAMICS:    VENTILATOR SETTINGS: Vent Mode:  [-] PRVC FiO2 (%):  [40 %] 40 % Set Rate:  [20 bmp] 20 bmp Vt Set:  [530 mL] 530 mL PEEP:  [5 cmH20] 5 cmH20 Plateau Pressure:  [17 cmH20-18 cmH20] 18 cmH20  INTAKE / OUTPUT: I/O last 3 completed shifts: In: 320 [I.V.:320] Out: 3390 [Urine:3390]  PHYSICAL EXAMINATION: General:  On ventilator Neuro:  No cough to suction, no spontaneous breathing on pressure support, no gag, no corneal/pupillary or doll's eye reflexes. HEENT:  oropharynx with dry mucous membranes, endotracheal tube in  place Cardiovascular:  Regular rate and rhythm no murmurs, S4 gallop noted Lungs:  Clear to auscultation bilaterally, ventilator supported breaths Abdomen:  Belly soft, nontender bowel sounds positive Musculoskeletal:  Diminished tone Skin:  No bruising, no rash  LABS:  BMET  Recent Labs Lab 08/04/2015 1930  NA 143  K 3.2*  CL 107  CO2 29  BUN 10  CREATININE 1.75*  GLUCOSE 145*   Electrolytes  Recent Labs Lab 08/01/2015 1930  CALCIUM 8.8*   CBC  Recent Labs Lab 07/24/2015 1930  WBC 9.7  HGB 12.8*  HCT 36.7*  PLT 169   Coag's  Recent Labs Lab 07/30/2015 1930  INR 0.92    Sepsis Markers  Recent Labs Lab 08/07/2015 1948  LATICACIDVEN 1.88   ABG  Recent Labs Lab 08/10/2015 2016  PHART 7.549*  PCO2ART 35.2  PO2ART 491.0*   Liver Enzymes  Recent Labs Lab 08/13/2015 1930  AST 39  ALT 14*  ALKPHOS 56  BILITOT 0.7  ALBUMIN 3.7    Cardiac Enzymes No results for input(s): TROPONINI, PROBNP in the last 168 hours.  Glucose No results for input(s): GLUCAP in the last 168 hours.  Imaging No results found.   STUDIES:  CT head images personally reviewed showing a large subdural hematoma as detailed above  CULTURES: None  ANTIBIOTICS: None  SIGNIFICANT EVENTS: June 8 admission  LINES/TUBES: June 8 endotracheal tube  DISCUSSION: 75 year old male with a past medical history significant for recent subdural hematoma evacuation is being  admitted on 08/16/2015 with recurrent subdural bleed causing brain herniation. His prognosis is dismal. Currently his neurologic exam shows severe neurologic injury and this will very likely progress to brain death over the course of the evening. There is no intervention we can make which will be medically beneficial or change his outcome.  ASSESSMENT / PLAN:  PULMONARY A: Inability to protect airway, on mechanical ventilation P:   Full ventilatory support Apnea test today Ventilator associated pneumonia  prevention protocol Extubate when family ready to withdraw care or declare brain dead  NEUROLOGIC A:   Large subdural hematoma comatose Brain herniation P:   RASS goal: 0 No sedation   FAMILY  - Updates: Spoke with family bedside, informed them that he now has no respiratory drive and that exam is consistent with brain death.  Informed them that we will perform an apnea test and if inconclusive then a nuclear scan.  They are calling additional family in.  In the meantime, I agree with Dr. Lamonte Sakai that we will not provide any additional interventions.  - Inter-disciplinary family meet or Palliative Care meeting due by:  day 7  The patient is critically ill with multiple organ systems failure and requires high complexity decision making for assessment and support, frequent evaluation and titration of therapies, application of advanced monitoring technologies and extensive interpretation of multiple databases.   Critical Care Time devoted to patient care services described in this note is  35  Minutes. This time reflects time of care of this signee Dr Jennet Maduro. This critical care time does not reflect procedure time, or teaching time or supervisory time of PA/NP/Med student/Med Resident etc but could involve care discussion time.  Rush Farmer, [name] [facility] Pulmonary/Critical Care Medicine. Pager: [phone]. After hours pager: [phone].

## 2015-07-28 DIAGNOSIS — I959 Hypotension, unspecified: Secondary | ICD-10-CM

## 2015-07-28 NOTE — Progress Notes (Addendum)
Spoke with entire family, informed that patient is brain dead, will no longer escalate care, make a full DNR and place extubation order.  Once family is ready then will terminally extubate.  All questions answered and condolences given.  Rush Farmer, [name] [facility] Pulmonary/Critical Care Medicine. Pager: [phone]. After hours pager: [phone].

## 2015-07-28 NOTE — Procedures (Signed)
Extubation Procedure Note  Patient Details:   Name: [name] DOB: September 16, 1940 MRN: [id number]   Airway Documentation:  Airway 7.5 mm (Active)  Secured at (cm) 22 cm 07/28/2015  3:10 PM  Measured From Lips 07/28/2015  3:10 PM  [place] 07/28/2015  3:10 PM  Secured By [employer] 07/28/2015  3:10 PM  Tube Holder Repositioned Yes 07/28/2015  3:10 PM  Cuff Pressure (cm H2O) 24 cm H2O 07/28/2015  3:10 PM  Site Condition Dry 07/28/2015  3:10 PM    Evaluation  O2 sats: stable throughout Complications: No apparent complications Patient did tolerate procedure well. Bilateral Breath Sounds: Clear   No   Pt. Was terminally extubated without any complications with family & RN at the bedside.   Claretta Fraise 07/28/2015, 6:13 PM

## 2015-07-28 NOTE — Progress Notes (Signed)
PULMONARY / CRITICAL CARE MEDICINE   Name: [name] MRN: [id number] DOB: 11-13-1940    ADMISSION DATE:  07/24/2015 CONSULTATION DATE:  08/15/2015  REFERRING MD:  Dr. Roxanne Mins  CHIEF COMPLAINT:  Subdural hematoma  HISTORY OF PRESENT ILLNESS:   This is a 75 year old male with a past medical history significant for chronic subdural hematoma who underwent an evacuation of the same a few weeks ago who presents to our facility after collapsing at home. Apparently he was in his usual state of health until he suddenly fell backwards while talking to a friend. He was brought to the emergency department within 30 minutes. Here in the ER he required intubation for airway protection. CT scan confirmed a large right-sided subdural hematoma causing brain herniation. Neurosurgery was consulted and stated that based on the CT scan findings, the severity of his neurologic injury based on exam, and the timing of the event that there is no intervention that can be made that will change his outcome. His overall prognosis is grim.  History was obtained entirely from talking to family members and chart review, the patient was obtunded on my exam.   SUBJECTIVE:  Borderline hypotension overnight.  VITAL SIGNS: BP 97/51 mmHg  Pulse 69  Temp(Src) 94.8 F (34.9 C) (Core (Comment))  Resp 9  Ht 5\' 7"  (1.702 m)  Wt 83.1 kg (183 lb 3.2 oz)  BMI 28.69 kg/m2  SpO2 100%  HEMODYNAMICS:    VENTILATOR SETTINGS: Vent Mode:  [-] PRVC FiO2 (%):  [40 %] 40 % Set Rate:  [6 bmp-8 bmp] 8 bmp Vt Set:  [530 mL] 530 mL PEEP:  [5 cmH20] 5 cmH20 Plateau Pressure:  [15 cmH20-19 cmH20] 18 cmH20  INTAKE / OUTPUT: I/O last 3 completed shifts: In: 370 [I.V.:370] Out: 3005 [Urine:3005]  PHYSICAL EXAMINATION: General:  On ventilator, brain dead. Neuro:  No cough to suction, no spontaneous breathing on pressure support, no gag, no corneal/pupillary or doll's eye reflexes and no respiratory drive. HEENT:  oropharynx with  dry mucous membranes, endotracheal tube in place Cardiovascular:  Regular rate and rhythm no murmurs, S4 gallop noted Lungs:  Clear to auscultation bilaterally, ventilator supported breaths Abdomen:  Belly soft, nontender bowel sounds positive Musculoskeletal:  Diminished tone Skin:  No bruising, no rash  LABS:  BMET  Recent Labs Lab 08/14/2015 1930  NA 143  K 3.2*  CL 107  CO2 29  BUN 10  CREATININE 1.75*  GLUCOSE 145*   Electrolytes  Recent Labs Lab 08/11/2015 1930  CALCIUM 8.8*   CBC  Recent Labs Lab 08/01/2015 1930  WBC 9.7  HGB 12.8*  HCT 36.7*  PLT 169   Coag's  Recent Labs Lab 07/31/2015 1930  INR 0.92    Sepsis Markers  Recent Labs Lab 07/26/2015 1948  LATICACIDVEN 1.88   ABG  Recent Labs Lab 07/28/2015 1209 08/03/2015 1246 08/06/2015 1330  PHART 7.532* 7.437 7.295*  PCO2ART 29.7* 39.4 59.5*  PO2ART 163.0* 170* 302*   Liver Enzymes  Recent Labs Lab 08/05/2015 1930  AST 39  ALT 14*  ALKPHOS 56  BILITOT 0.7  ALBUMIN 3.7    Cardiac Enzymes No results for input(s): TROPONINI, PROBNP in the last 168 hours.  Glucose No results for input(s): GLUCAP in the last 168 hours.  Imaging No results found.   STUDIES:  CT head images personally reviewed showing a large subdural hematoma as detailed above  CULTURES: None  ANTIBIOTICS: None  SIGNIFICANT EVENTS: June 8 admission  LINES/TUBES: June 8 endotracheal  tube  DISCUSSION: 75 year old male with a past medical history significant for recent subdural hematoma evacuation is being admitted on 07/29/2015 with recurrent subdural bleed causing brain herniation. His prognosis is dismal. Currently his neurologic exam shows severe neurologic injury and this will very likely progress to brain death over the course of the evening. There is no intervention we can make which will be medically beneficial or change his outcome.  ASSESSMENT / PLAN:  PULMONARY A: Inability to protect airway, on  mechanical ventilation P:   Full ventilatory support until family is ready for extubation, will meet with them today. Apnea test indicated brain death. Ventilator associated pneumonia prevention protocol Extubate when family ready to withdraw care or declare brain dead  NEUROLOGIC A:   Large subdural hematoma comatose Brain herniation P:   RASS goal: 0 No sedation   FAMILY  - Updates: Updated family yesterday over the phone, informed of brain death, family is coming in for conversation now.  In the meantime, I agree with Dr. Lamonte Sakai that we will not provide any additional interventions.  - Inter-disciplinary family meet or Palliative Care meeting due by:  day 7  The patient is critically ill with multiple organ systems failure and requires high complexity decision making for assessment and support, frequent evaluation and titration of therapies, application of advanced monitoring technologies and extensive interpretation of multiple databases.   Critical Care Time devoted to patient care services described in this note is  35  Minutes. This time reflects time of care of this signee Dr Jennet Maduro. This critical care time does not reflect procedure time, or teaching time or supervisory time of PA/NP/Med student/Med Resident etc but could involve care discussion time.  Rush Farmer, [name] [facility] Pulmonary/Critical Care Medicine. Pager: [phone]. After hours pager: [phone].

## 2015-07-28 NOTE — Progress Notes (Signed)
Patient ID: [name], male   DOB: Jun 05, 1940, 75 y.o.   MRN: [id number] BP 96/53 mmHg  Pulse 72  Temp(Src) 94.8 F (34.9 C) (Core (Comment))  Resp 8  Ht 5\' 7"  (1.702 m)  Wt 83.1 kg (183 lb 3.2 oz)  BMI 28.69 kg/m2  SpO2 100% Patient without brain activity. No recommendations.

## 2015-07-29 LAB — GLUCOSE, CAPILLARY: GLUCOSE-CAPILLARY: 139 mg/dL — AB (ref 65–99)

## 2015-07-30 ENCOUNTER — Encounter (HOSPITAL_COMMUNITY): Payer: Self-pay | Admitting: Neurological Surgery

## 2015-08-08 NOTE — Discharge Summary (Signed)
NAME:  [name], CORTRIGHT NO.:  [id number]  MEDICAL RECORD NO.:  [id number]  LOCATION:                                 FACILITY:  PHYSICIAN:  Providence Lanius, MD  DATE OF BIRTH:  October 04, 1940  DATE OF ADMISSION:  07/26/2015 DATE OF DISCHARGE:  07/18/2015                              DISCHARGE SUMMARY   DEATH SUMMARY  PRIMARY DIAGNOSIS/CAUSE OF DEATH:  Subdural hematoma.  SECONDARY DIAGNOSES:  Intracranial hemorrhage, acute respiratory failure with hypoxemia, brain herniation, comatose, hypertension, acute encephalopathy.  The patient is a 75 year old male with an extensive past medical history who presented to the hospital with a chronic subdural hematoma and underwent an evacuation a few weeks ago, but came back after collapsing at home.  Apparently, he was in usual state of health until he fell backwards while talking to his friend, brought to the ER, required intubation for airway protection.  A CT confirmed a large right-sided subdural hematoma causing brain herniation.  Neurosurgery consulted and given the severity of the injury and based on the exam there was no intervention that can be made that will change the outcome and his overall prognosis was deemed grim and the family was notified of that. Dr. Christella Noa came back and spoke with the family, informed that the patient has no brain activity.  Subsequently, the patient was extubated and expired shortly thereafter.     Providence Lanius, MD   ______________________________ Providence Lanius, MD    WJY/MEDQ  D:  08/07/2015  T:  08/07/2015  Job:  [id number]

## 2015-08-17 DEATH — deceased

## 2015-08-27 ENCOUNTER — Other Ambulatory Visit: Payer: Medicare Other

## 2015-08-27 ENCOUNTER — Ambulatory Visit: Payer: Medicare Other | Admitting: Nurse Practitioner

## 2016-04-09 ENCOUNTER — Ambulatory Visit: Payer: Medicare Other | Admitting: Neurology
